# Patient Record
Sex: Male | Born: 1975 | Hispanic: Yes | Marital: Single | Smoking: Former smoker
Health system: Southern US, Community
[De-identification: ages and names within clinical notes are randomized; demographics above are authoritative.]

## PROBLEM LIST (undated history)

## (undated) DIAGNOSIS — K7010 Alcoholic hepatitis without ascites: Secondary | ICD-10-CM

## (undated) DIAGNOSIS — R7881 Bacteremia: Secondary | ICD-10-CM

## (undated) DIAGNOSIS — R188 Other ascites: Secondary | ICD-10-CM

## (undated) DIAGNOSIS — K709 Alcoholic liver disease, unspecified: Secondary | ICD-10-CM

## (undated) DIAGNOSIS — D649 Anemia, unspecified: Secondary | ICD-10-CM

## (undated) HISTORY — PX: HAND SURGERY: SHX662

## (undated) HISTORY — DX: Other ascites: R18.8

## (undated) HISTORY — PX: HEMORRHOID BANDING: SHX5850

---

## 2012-09-07 ENCOUNTER — Encounter (HOSPITAL_COMMUNITY): Payer: Self-pay | Admitting: Emergency Medicine

## 2012-09-07 ENCOUNTER — Emergency Department (HOSPITAL_COMMUNITY): Payer: Self-pay

## 2012-09-07 ENCOUNTER — Emergency Department (HOSPITAL_COMMUNITY)
Admission: EM | Admit: 2012-09-07 | Discharge: 2012-09-07 | Disposition: A | Discharge status: Home / Self Care | Payer: Self-pay | Attending: Emergency Medicine | Admitting: Emergency Medicine

## 2012-09-07 DIAGNOSIS — Y929 Unspecified place or not applicable: Secondary | ICD-10-CM | POA: Insufficient documentation

## 2012-09-07 DIAGNOSIS — Y939 Activity, unspecified: Secondary | ICD-10-CM | POA: Insufficient documentation

## 2012-09-07 DIAGNOSIS — W108XXA Fall (on) (from) other stairs and steps, initial encounter: Secondary | ICD-10-CM | POA: Insufficient documentation

## 2012-09-07 DIAGNOSIS — S93409A Sprain of unspecified ligament of unspecified ankle, initial encounter: Secondary | ICD-10-CM | POA: Insufficient documentation

## 2012-09-07 DIAGNOSIS — S93401A Sprain of unspecified ligament of right ankle, initial encounter: Secondary | ICD-10-CM

## 2012-09-07 MED ORDER — HYDROCODONE-ACETAMINOPHEN 5-325 MG PO TABS: 1.0000 | ORAL_TABLET | Freq: Four times a day (QID) | ORAL | Status: DC | PRN

## 2012-09-07 MED ORDER — NAPROXEN 500 MG PO TABS: 500.0000 mg | ORAL_TABLET | Freq: Two times a day (BID) | ORAL | Status: DC

## 2012-09-07 NOTE — Progress Notes (Signed)
Met Patient at bedside.Patient does not speak english,spanish speaking gentleman.Telephonic interpreting services used.Interpreter explained to patient they would assist [place] today.Patient reports Via the spanish interpreting service that he understands.Case manager role explained.Patient reports he does not have a PCP.Pateint educated on the location-contact number of the health center  On [address].Patient aware of this location and reports he will call the contact number.Patient reports he does not take any medications.Patient educated on the [address] help line. Patient verbalized via the spanish Interpreting service that he understands the verbal- written resources.The patient does not have any questions at this time.No further Case Manager  needs are Identified.

## 2012-09-07 NOTE — Progress Notes (Signed)
Orthopedic Tech Progress Note Patient Details:  Danny Arnold 04/07/1975 [id number]  Ortho Devices Type of Ortho Device: ASO;Crutches Ortho Device/Splint Interventions: Application   Cammer, Mickie Bail 09/07/2012, 11:02 AM

## 2012-09-07 NOTE — ED Notes (Signed)
Unable to sign discharge due computer failure.

## 2012-09-07 NOTE — ED Notes (Signed)
Pt reports fell down 2 stairs last Saturday. Pt c/o pain to right leg and ankle. Pt presents with bruising and swelling to right lower leg and ankle.

## 2012-09-07 NOTE — ED Provider Notes (Signed)
CSN: [id number]     Arrival date & time 09/07/12  0920 History     First MD Initiated Contact with Patient 09/07/12 0945     Chief Complaint  Patient presents with  . Fall  . Leg Pain   (Consider location/radiation/quality/duration/timing/severity/associated sxs/prior Treatment) HPI Comments: Patient presenting with left ankle pain.  He reports that the pain has been present for the past week and has been constant.  One week ago he tripped going down the stairs and twisted his left ankle.  He has noticed some swelling and bruising of the ankle since the injury.  He has been ambulating since the injury, but reports that the pain is worse with ambulation.  He has not been taking anything for pain.  He has not had any medical evaluation prior to coming in today.  He denies numbness or tingling.    The history is provided by the patient. The history is limited by a language barrier. A language interpreter was used Oceanographer used).    History reviewed. No pertinent past medical history. Past Surgical History  Procedure Laterality Date  . Hand surgery     No family history on file. History  Substance Use Topics  . Smoking status: Never Smoker   . Smokeless tobacco: Not on file  . Alcohol Use: Yes    Review of Systems  Musculoskeletal:       Left ankle pain  All other systems reviewed and are negative.    Allergies  Review of patient's allergies indicates no known allergies.  Home Medications  No current outpatient prescriptions on file. BP 155/89  Pulse 110  Temp(Src) 97.9 F (36.6 C) (Oral)  Resp 20  Wt 178 lb (80.74 kg)  SpO2 98% Physical Exam  Nursing note and vitals reviewed. Constitutional: He appears well-developed and well-nourished.  HENT:  Head: Normocephalic and atraumatic.  Neck: Normal range of motion. Neck supple.  Cardiovascular: Normal rate, regular rhythm and normal heart sounds.   Pulses:      Dorsalis pedis pulses are 2+ on the  right side, and 2+ on the left side.  Pulmonary/Chest: Effort normal and breath sounds normal.  Musculoskeletal:       Right ankle: He exhibits decreased range of motion, swelling and ecchymosis. He exhibits no laceration and normal pulse. Tenderness. Medial malleolus tenderness found.  Neurological: He is alert. No sensory deficit.  Skin: Skin is warm and dry. No erythema.  Psychiatric: He has a normal mood and affect.    ED Course   Procedures (including critical care time)  Labs Reviewed - No data to display Dg Tibia/fibula Right  09/07/2012   *RADIOLOGY REPORT*  Clinical Data: Lower leg and ankle pain since falling 1 week ago  RIGHT TIBIA AND FIBULA - 2 VIEW  Comparison: None.  Findings: The distal lower leg is included on the separate examination of the ankle. The mineralization and alignment are normal.  There is no evidence of acute fracture or dislocation.  No soft tissue abnormalities are identified.  IMPRESSION: No acute osseous findings.   Original Report Authenticated By: Carey Bullocks, M.D.   Dg Ankle Complete Right  09/07/2012   *RADIOLOGY REPORT*  Clinical Data: Lower leg and ankle pain since falling 1 week ago.  RIGHT ANKLE - COMPLETE 3+ VIEW  Comparison: None.  Findings: The mineralization and alignment are normal.  There is no evidence of acute fracture or dislocation.  The joint spaces are maintained.  There is some soft tissue  swelling both medially and distally.  IMPRESSION: No acute osseous findings.  Soft tissue swelling around the ankle.   Original Report Authenticated By: Carey Bullocks, M.D.   No diagnosis found.  MDM  Patient presenting with pain of his left ankle after falling down the stairs one week ago.  Xrays today negative.  Patient neurovascularly intact.  Patient given ankle ASO and crutches.  Pascal Lux [place], PA-C 09/08/12 [phone]

## 2012-09-08 NOTE — ED Provider Notes (Signed)
Medical screening examination/treatment/procedure(s) were performed by non-physician practitioner and as supervising physician I was immediately available for consultation/collaboration.   Kedrick Mcnamee, MD 09/08/12 1535 

## 2012-10-27 ENCOUNTER — Emergency Department (HOSPITAL_COMMUNITY): Payer: No Typology Code available for payment source

## 2012-10-27 ENCOUNTER — Encounter (HOSPITAL_COMMUNITY): Payer: Self-pay | Admitting: Emergency Medicine

## 2012-10-27 ENCOUNTER — Inpatient Hospital Stay (HOSPITAL_COMMUNITY)
Admission: EM | Admit: 2012-10-27 | Discharge: 2012-11-08 | DRG: 433 | Disposition: A | Discharge status: Home / Self Care | Payer: Self-pay | Attending: Internal Medicine | Admitting: Internal Medicine

## 2012-10-27 DIAGNOSIS — R7881 Bacteremia: Secondary | ICD-10-CM | POA: Diagnosis present

## 2012-10-27 DIAGNOSIS — R634 Abnormal weight loss: Secondary | ICD-10-CM | POA: Diagnosis present

## 2012-10-27 DIAGNOSIS — K625 Hemorrhage of anus and rectum: Secondary | ICD-10-CM | POA: Diagnosis present

## 2012-10-27 DIAGNOSIS — E871 Hypo-osmolality and hyponatremia: Secondary | ICD-10-CM | POA: Diagnosis present

## 2012-10-27 DIAGNOSIS — R17 Unspecified jaundice: Secondary | ICD-10-CM

## 2012-10-27 DIAGNOSIS — D72829 Elevated white blood cell count, unspecified: Secondary | ICD-10-CM | POA: Diagnosis present

## 2012-10-27 DIAGNOSIS — D649 Anemia, unspecified: Secondary | ICD-10-CM | POA: Diagnosis present

## 2012-10-27 DIAGNOSIS — R791 Abnormal coagulation profile: Secondary | ICD-10-CM | POA: Diagnosis present

## 2012-10-27 DIAGNOSIS — B9689 Other specified bacterial agents as the cause of diseases classified elsewhere: Secondary | ICD-10-CM | POA: Diagnosis present

## 2012-10-27 DIAGNOSIS — E872 Acidosis, unspecified: Secondary | ICD-10-CM | POA: Diagnosis present

## 2012-10-27 DIAGNOSIS — K766 Portal hypertension: Secondary | ICD-10-CM | POA: Diagnosis present

## 2012-10-27 DIAGNOSIS — K5900 Constipation, unspecified: Secondary | ICD-10-CM | POA: Diagnosis present

## 2012-10-27 DIAGNOSIS — F1020 Alcohol dependence, uncomplicated: Secondary | ICD-10-CM | POA: Diagnosis present

## 2012-10-27 DIAGNOSIS — Z6827 Body mass index (BMI) 27.0-27.9, adult: Secondary | ICD-10-CM

## 2012-10-27 DIAGNOSIS — K7010 Alcoholic hepatitis without ascites: Principal | ICD-10-CM | POA: Diagnosis present

## 2012-10-27 DIAGNOSIS — K8309 Other cholangitis: Secondary | ICD-10-CM

## 2012-10-27 DIAGNOSIS — R932 Abnormal findings on diagnostic imaging of liver and biliary tract: Secondary | ICD-10-CM

## 2012-10-27 DIAGNOSIS — K7030 Alcoholic cirrhosis of liver without ascites: Secondary | ICD-10-CM

## 2012-10-27 DIAGNOSIS — E876 Hypokalemia: Secondary | ICD-10-CM | POA: Diagnosis present

## 2012-10-27 DIAGNOSIS — K709 Alcoholic liver disease, unspecified: Secondary | ICD-10-CM

## 2012-10-27 LAB — COMPREHENSIVE METABOLIC PANEL
Alkaline Phosphatase: 140 U/L — ABNORMAL HIGH (ref 39–117)
BUN: 12 mg/dL (ref 6–23)
Calcium: 8.9 mg/dL (ref 8.4–10.5)
GFR calc Af Amer: >90 mL/min (ref >90)
Glucose, Bld: 109 mg/dL — ABNORMAL HIGH (ref 70–99)
Total Protein: 7.9 g/dL (ref 6.0–8.3)

## 2012-10-27 LAB — CBC WITH DIFFERENTIAL/PLATELET
Basophils Relative: 1 % (ref 0–1)
Eosinophils Absolute: 0.2 10*3/uL (ref 0.0–0.7)
HCT: 28.6 % — ABNORMAL LOW (ref 39.0–52.0)
Hemoglobin: 10.5 g/dL — ABNORMAL LOW (ref 13.0–17.0)
Lymphs Abs: 1.2 10*3/uL (ref 0.7–4.0)
MCH: 34.9 pg — ABNORMAL HIGH (ref 26.0–34.0)
MCHC: 36.7 g/dL — ABNORMAL HIGH (ref 30.0–36.0)
Monocytes Absolute: 2 10*3/uL — ABNORMAL HIGH (ref 0.1–1.0)
Neutro Abs: 14.2 10*3/uL — ABNORMAL HIGH (ref 1.7–7.7)

## 2012-10-27 LAB — URINALYSIS, ROUTINE W REFLEX MICROSCOPIC
Nitrite: NEGATIVE
Protein, ur: NEGATIVE mg/dL
Specific Gravity, Urine: 1.015 (ref 1.005–1.030)
Urobilinogen, UA: 0.2 mg/dL (ref 0.0–1.0)

## 2012-10-27 LAB — POCT I-STAT 3, ART BLOOD GAS (G3+)
Bicarbonate: 15.1 mEq/L — ABNORMAL LOW (ref 20.0–24.0)
Patient temperature: 98.9
TCO2: 16 mmol/L (ref 0–100)
pH, Arterial: 7.346 — ABNORMAL LOW (ref 7.350–7.450)
pO2, Arterial: 90 mmHg (ref 80.0–100.0)

## 2012-10-27 LAB — CG4 I-STAT (LACTIC ACID): Lactic Acid, Venous: 1.07 mmol/L (ref 0.5–2.2)

## 2012-10-27 LAB — LIPASE, BLOOD: Lipase: 78 U/L — ABNORMAL HIGH (ref 11–59)

## 2012-10-27 LAB — URINE MICROSCOPIC-ADD ON

## 2012-10-27 MED ORDER — SODIUM CHLORIDE 0.9 % IV BOLUS (SEPSIS)
1000.0000 mL | Freq: Once | INTRAVENOUS | Status: AC
  Administered 2012-10-27: 1000 mL via INTRAVENOUS

## 2012-10-27 MED ORDER — POTASSIUM CHLORIDE CRYS ER 20 MEQ PO TBCR
40.0000 meq | EXTENDED_RELEASE_TABLET | Freq: Once | ORAL | Status: AC
  Administered 2012-10-27: 40 meq via ORAL
  Filled 2012-10-27: qty 2

## 2012-10-27 MED ORDER — IOHEXOL 300 MG/ML  SOLN
25.0000 mL | Freq: Once | INTRAMUSCULAR | Status: AC | PRN
  Administered 2012-10-27: 25 mL via ORAL

## 2012-10-27 NOTE — ED Notes (Addendum)
Pt has VERYJaudiced skin x1 week. Pt reports that he was a VERY heavy drinker of ETOH, but quit 8 months ago.  Pt reports vomiting and pain when he eats x3 days  Pt also reports pain with voiding and subsequently has had minimal fluid intake x4 days.  Pt speaks only spanish but has english speaking friend at [facility].  Last void was today and described as painful & small amount.   Last 2 BM were today: first PTA and also 2 hours PTA. Pt took laxitive 2 hours PTA and earlier in the AM today. Pt describes BM color as "white"  Pt denies any other medical History.

## 2012-10-27 NOTE — ED Provider Notes (Signed)
CSN: [id number]     Arrival date & time 10/27/12  2024 History   First MD Initiated Contact with Patient 10/27/12 2111     Chief Complaint  Patient presents with  . Jaundice   (Consider location/radiation/quality/duration/timing/severity/associated sxs/prior Treatment) HPI Comments: 37 year old male presents with one-week of worsening jaundice. Patient states that he has abdominal pain after eating as well as some nausea. Due to this he has not eaten in the last couple days. He's been constipated the last 20 days when he does have a stool it is hard "like a rock". He says his stools are white. He denies any fevers or chills. Her last one month he lost approximately 15 pounds. Denies any night sweats. Denies any urinary symptoms.  The history is provided by the patient.    History reviewed. No pertinent past medical history. Past Surgical History  Procedure Laterality Date  . Hand surgery     No family history on file. History  Substance Use Topics  . Smoking status: Never Smoker   . Smokeless tobacco: Not on file  . Alcohol Use: Yes     Comment: heavy etoh until 02/2012- since then occasional    Review of Systems  Constitutional: Positive for unexpected weight change. Negative for fever and chills.  Gastrointestinal: Positive for nausea, abdominal pain, constipation and abdominal distention. Negative for vomiting.  Genitourinary: Negative for dysuria.  All other systems reviewed and are negative.    Allergies  Review of patient's allergies indicates no known allergies.  Home Medications   Current Outpatient Rx  Name  Route  Sig  Dispense  Refill  . HYDROcodone-acetaminophen (NORCO/VICODIN) 5-325 MG per tablet   Oral   Take 1-2 tablets by mouth every 6 (six) hours as needed for pain.   20 tablet   0   . naproxen (NAPROSYN) 500 MG tablet   Oral   Take 1 tablet (500 mg total) by mouth 2 (two) times daily.   30 tablet   0    BP 126/69  Pulse 90  Temp(Src) 98.6 F  (37 C) (Oral)  Resp 20  Ht 5\' 2"  (1.575 m)  Wt 163 lb (73.936 kg)  BMI 29.81 kg/m2  SpO2 100% Physical Exam  Nursing note and vitals reviewed. Constitutional: He is oriented to person, place, and time. He appears well-developed and well-nourished. No distress.  HENT:  Head: Normocephalic and atraumatic.  Right Ear: External ear normal.  Left Ear: External ear normal.  Nose: Nose normal.  Eyes: Right eye exhibits no discharge. Left eye exhibits no discharge.  Jaundiced eyes  Neck: Neck supple.  Cardiovascular: Normal rate, regular rhythm, normal heart sounds and intact distal pulses.   Pulmonary/Chest: Effort normal and breath sounds normal.  Abdominal: Soft. He exhibits distension. There is no tenderness.  Musculoskeletal: He exhibits no edema.  Neurological: He is alert and oriented to person, place, and time.  Skin: Skin is warm and dry.  Diffuse jaundice    ED Course  Procedures (including critical care time) Labs Review Labs Reviewed  CBC WITH DIFFERENTIAL - Abnormal; Notable for the following:    WBC 17.8 (*)    RBC 3.01 (*)    Hemoglobin 10.5 (*)    HCT 28.6 (*)    MCH 34.9 (*)    MCHC 36.7 (*)    RDW 24.2 (*)    Neutrophils Relative % 80 (*)    Lymphocytes Relative 7 (*)    Neutro Abs 14.2 (*)    Monocytes Absolute  2.0 (*)    Basophils Absolute 0.2 (*)    All other components within normal limits  COMPREHENSIVE METABOLIC PANEL - Abnormal; Notable for the following:    Sodium 124 (*)    Potassium 2.8 (*)    Chloride 92 (*)    CO2 16 (*)    Glucose, Bld 109 (*)    Albumin 2.5 (*)    AST 145 (*)    Alkaline Phosphatase 140 (*)    Total Bilirubin 40.2 (*)    All other components within normal limits  LIPASE, BLOOD - Abnormal; Notable for the following:    Lipase 78 (*)    All other components within normal limits  URINALYSIS, ROUTINE W REFLEX MICROSCOPIC - Abnormal; Notable for the following:    Color, Urine ORANGE (*)    Bilirubin Urine LARGE (*)     Ketones, ur 15 (*)    Leukocytes, UA TRACE (*)    All other components within normal limits  URINE MICROSCOPIC-ADD ON - Abnormal; Notable for the following:    Squamous Epithelial / LPF FEW (*)    Bacteria, UA FEW (*)    All other components within normal limits  PROTIME-INR - Abnormal; Notable for the following:    Prothrombin Time 17.1 (*)    All other components within normal limits  POCT I-STAT 3, BLOOD GAS (G3+) - Abnormal; Notable for the following:    pH, Arterial 7.346 (*)    pCO2 arterial 27.7 (*)    Bicarbonate 15.1 (*)    Acid-base deficit 9.0 (*)    All other components within normal limits  CG4 I-STAT (LACTIC ACID)   Imaging Review Dg Abd Acute W/chest  10/27/2012   CLINICAL DATA:  Fever and constipation. Abdominal pain.  EXAM: ACUTE ABDOMEN SERIES (ABDOMEN 2 VIEW & CHEST 1 VIEW)  COMPARISON:  None.  FINDINGS: Elevated right diaphragm of uncertain cause. Low volume chest without focal opacity. No cardiomegaly.  Question hepatomegaly given elevated right diaphragm and extension nearly to the pelvic inlet. The stomach appears slightly displaced to the left additionally. Nonobstructive bowel gas pattern. No abnormal intra-abdominal calcification. Negative osseous structures.  IMPRESSION: 1. Nonobstructive bowel gas pattern. No pneumoperitoneum. 2. Elevated right diaphragm, possibly from hepatomegaly. 3. Clear chest.   Electronically Signed   By: Tiburcio Pea   On: 10/27/2012 21:54    MDM   1. Hyperbilirubinemia    Patient is in no acute distress and has normal vital signs. His family brought him in due to him having worsening jaundice. With painless jaundice and weight loss the highest concern is for a mass. I discussed this at the bedside with the patient through his friends the patient is unable to speak [place] well. Patient understands water concern about and denies any current abdominal pain. I've given him fluids will replace potassium. His CT scan is currently pending  and care was transferred to Dr. Redgie Grayer CT scan pending    Audree Camel, MD 10/28/12 [phone]

## 2012-10-27 NOTE — ED Notes (Signed)
Reports jaundice x 1 week.  Denies pain.  Also reports constipation x 22 days and decreased urination over the past several weeks.  Last BM just pta.

## 2012-10-28 ENCOUNTER — Emergency Department (HOSPITAL_COMMUNITY): Payer: No Typology Code available for payment source

## 2012-10-28 ENCOUNTER — Encounter (HOSPITAL_COMMUNITY): Payer: Self-pay | Admitting: Radiology

## 2012-10-28 DIAGNOSIS — K709 Alcoholic liver disease, unspecified: Secondary | ICD-10-CM

## 2012-10-28 DIAGNOSIS — D649 Anemia, unspecified: Secondary | ICD-10-CM | POA: Diagnosis present

## 2012-10-28 DIAGNOSIS — R932 Abnormal findings on diagnostic imaging of liver and biliary tract: Secondary | ICD-10-CM

## 2012-10-28 DIAGNOSIS — K7030 Alcoholic cirrhosis of liver without ascites: Secondary | ICD-10-CM

## 2012-10-28 DIAGNOSIS — K7010 Alcoholic hepatitis without ascites: Principal | ICD-10-CM

## 2012-10-28 DIAGNOSIS — R17 Unspecified jaundice: Secondary | ICD-10-CM

## 2012-10-28 DIAGNOSIS — E871 Hypo-osmolality and hyponatremia: Secondary | ICD-10-CM | POA: Diagnosis present

## 2012-10-28 DIAGNOSIS — D72829 Elevated white blood cell count, unspecified: Secondary | ICD-10-CM | POA: Diagnosis present

## 2012-10-28 LAB — GLUCOSE, CAPILLARY
Glucose-Capillary: 92 mg/dL (ref 70–99)
Glucose-Capillary: 43 mg/dL — CL (ref 70–99)
Glucose-Capillary: 94 mg/dL (ref 70–99)
Glucose-Capillary: 136 mg/dL — ABNORMAL HIGH (ref 70–99)

## 2012-10-28 LAB — PROTIME-INR
INR: 1.43 (ref 0.00–1.49)
Prothrombin Time: 17.1 seconds — ABNORMAL HIGH (ref 11.6–15.2)

## 2012-10-28 LAB — TYPE AND SCREEN

## 2012-10-28 LAB — CBC WITH DIFFERENTIAL/PLATELET
Eosinophils Relative: 1 % (ref 0–5)
MCH: 34.8 pg — ABNORMAL HIGH (ref 26.0–34.0)
Monocytes Absolute: 1.3 10*3/uL — ABNORMAL HIGH (ref 0.1–1.0)
Monocytes Relative: 9 % (ref 3–12)
Neutrophils Relative %: 82 % — ABNORMAL HIGH (ref 43–77)
Platelets: 244 10*3/uL (ref 150–400)
RBC: 2.73 MIL/uL — ABNORMAL LOW (ref 4.22–5.81)
WBC: 14.6 10*3/uL — ABNORMAL HIGH (ref 4.0–10.5)

## 2012-10-28 LAB — BASIC METABOLIC PANEL
Calcium: 7.9 mg/dL — ABNORMAL LOW (ref 8.4–10.5)
Creatinine, Ser: 0.52 mg/dL (ref 0.50–1.35)
GFR calc Af Amer: >90 mL/min (ref >90)
GFR calc non Af Amer: >90 mL/min (ref >90)

## 2012-10-28 LAB — OSMOLALITY, URINE: Osmolality, Ur: 360 mOsm/kg — ABNORMAL LOW (ref 390–1090)

## 2012-10-28 LAB — HEPATIC FUNCTION PANEL
Indirect Bilirubin: 6.3 mg/dL — ABNORMAL HIGH (ref 0.3–0.9)
Total Protein: 6.6 g/dL (ref 6.0–8.3)

## 2012-10-28 MED ORDER — OCTREOTIDE LOAD VIA INFUSION
50.0000 ug | Freq: Once | INTRAVENOUS | Status: AC
  Administered 2012-10-28: 50 ug via INTRAVENOUS
  Filled 2012-10-28: qty 25

## 2012-10-28 MED ORDER — ONDANSETRON HCL 4 MG PO TABS: 4.0000 mg | ORAL_TABLET | Freq: Four times a day (QID) | ORAL | Status: DC | PRN

## 2012-10-28 MED ORDER — DEXTROSE 5 % IV SOLN
1.0000 g | Freq: Three times a day (TID) | INTRAVENOUS | Status: DC
  Administered 2012-10-28 – 2012-11-01 (×13): 1 g via INTRAVENOUS
  Filled 2012-10-28 (×16): qty 1

## 2012-10-28 MED ORDER — PANTOPRAZOLE SODIUM 40 MG IV SOLR
40.0000 mg | Freq: Two times a day (BID) | INTRAVENOUS | Status: DC
  Administered 2012-10-28 – 2012-11-04 (×15): 40 mg via INTRAVENOUS
  Filled 2012-10-28 (×17): qty 40

## 2012-10-28 MED ORDER — VITAMIN B-1 100 MG PO TABS
100.0000 mg | ORAL_TABLET | Freq: Every day | ORAL | Status: DC
  Administered 2012-10-28 – 2012-11-08 (×12): 100 mg via ORAL
  Filled 2012-10-28 (×12): qty 1

## 2012-10-28 MED ORDER — LORAZEPAM 2 MG/ML IJ SOLN: 0.0000 mg | Freq: Four times a day (QID) | INTRAMUSCULAR | Status: AC

## 2012-10-28 MED ORDER — FOLIC ACID 1 MG PO TABS
1.0000 mg | ORAL_TABLET | Freq: Every day | ORAL | Status: DC
Start: 2012-10-28 — End: 2012-11-08
  Administered 2012-10-28 – 2012-11-08 (×12): 1 mg via ORAL
  Filled 2012-10-28 (×12): qty 1

## 2012-10-28 MED ORDER — PNEUMOCOCCAL VAC POLYVALENT 25 MCG/0.5ML IJ INJ
0.5000 mL | INJECTION | INTRAMUSCULAR | Status: AC
  Administered 2012-10-29: 0.5 mL via INTRAMUSCULAR
  Filled 2012-10-28: qty 0.5

## 2012-10-28 MED ORDER — POTASSIUM CHLORIDE 10 MEQ/100ML IV SOLN
10.0000 meq | INTRAVENOUS | Status: AC
  Administered 2012-10-28 (×3): 10 meq via INTRAVENOUS
  Filled 2012-10-28 (×3): qty 100

## 2012-10-28 MED ORDER — THIAMINE HCL 100 MG/ML IJ SOLN
100.0000 mg | Freq: Every day | INTRAMUSCULAR | Status: DC
  Filled 2012-10-28 (×8): qty 1

## 2012-10-28 MED ORDER — POLYETHYLENE GLYCOL 3350 17 G PO PACK
17.0000 g | PACK | Freq: Every day | ORAL | Status: DC | PRN
  Administered 2012-11-02: 17 g via ORAL
  Filled 2012-10-28: qty 1

## 2012-10-28 MED ORDER — SODIUM CHLORIDE 0.9 % IV SOLN
25.0000 ug/h | INTRAVENOUS | Status: DC
  Administered 2012-10-28: 50 ug/h via INTRAVENOUS
  Filled 2012-10-28 (×2): qty 1

## 2012-10-28 MED ORDER — SODIUM CHLORIDE 0.9 % IJ SOLN
3.0000 mL | Freq: Two times a day (BID) | INTRAMUSCULAR | Status: DC
  Administered 2012-10-28 – 2012-11-06 (×7): 3 mL via INTRAVENOUS

## 2012-10-28 MED ORDER — POTASSIUM CHLORIDE CRYS ER 20 MEQ PO TBCR
40.0000 meq | EXTENDED_RELEASE_TABLET | ORAL | Status: AC
  Administered 2012-10-28 (×2): 40 meq via ORAL
  Filled 2012-10-28 (×2): qty 2

## 2012-10-28 MED ORDER — HYDROCORTISONE ACETATE 25 MG RE SUPP
25.0000 mg | Freq: Every morning | RECTAL | Status: DC
  Administered 2012-10-28 – 2012-11-05 (×8): 25 mg via RECTAL
  Filled 2012-10-28 (×9): qty 1

## 2012-10-28 MED ORDER — LORAZEPAM 2 MG/ML IJ SOLN: 1.0000 mg | Freq: Four times a day (QID) | INTRAMUSCULAR | Status: AC | PRN

## 2012-10-28 MED ORDER — ONDANSETRON HCL 4 MG/2ML IJ SOLN: 4.0000 mg | Freq: Four times a day (QID) | INTRAMUSCULAR | Status: DC | PRN

## 2012-10-28 MED ORDER — LORAZEPAM 1 MG PO TABS: 1.0000 mg | ORAL_TABLET | Freq: Four times a day (QID) | ORAL | Status: AC | PRN

## 2012-10-28 MED ORDER — ADULT MULTIVITAMIN W/MINERALS CH
1.0000 | ORAL_TABLET | Freq: Every day | ORAL | Status: DC
  Administered 2012-10-28 – 2012-11-08 (×12): 1 via ORAL
  Filled 2012-10-28 (×12): qty 1

## 2012-10-28 MED ORDER — LORAZEPAM 2 MG/ML IJ SOLN: 0.0000 mg | Freq: Two times a day (BID) | INTRAMUSCULAR | Status: AC

## 2012-10-28 MED ORDER — POTASSIUM CHLORIDE CRYS ER 20 MEQ PO TBCR
20.0000 meq | EXTENDED_RELEASE_TABLET | Freq: Once | ORAL | Status: AC
  Administered 2012-10-28: 20 meq via ORAL
  Filled 2012-10-28: qty 1

## 2012-10-28 MED ORDER — IOHEXOL 300 MG/ML  SOLN
100.0000 mL | Freq: Once | INTRAMUSCULAR | Status: AC | PRN
  Administered 2012-10-28: 100 mL via INTRAVENOUS

## 2012-10-28 MED ORDER — INFLUENZA VAC SPLIT QUAD 0.5 ML IM SUSP
0.5000 mL | INTRAMUSCULAR | Status: AC
  Administered 2012-10-29: 0.5 mL via INTRAMUSCULAR
  Filled 2012-10-28: qty 0.5

## 2012-10-28 NOTE — H&P (Signed)
[facility] History and Physical  Danny Arnold ZOX:[id number] DOB: [birth year] DOA: 10/27/2012  Referring physician: ER physician. PCP: No PCP Per Patient   Chief Complaint: Turning yellow.  Used Engineer, structural. Patient does not speak [place].  HPI: Danny Arnold is a 37 y.o. male with no significant past medical history who drinks alcohol every day except for the last 4 days presented to the ER as patient noticed himself becoming yellowish. Patient also has been having constipation and nausea. He has abdominal pain when he moves his bowels. In the ER patient was found to be jaundiced with blood work showing bilirubin around 40 with CT abdomen and pelvis done with contrast showing signs of portal hypertension and hepatomegaly. Patient states he drinks alcohol everyday except for last 4 days and denies having used any IV drugs. Patient states he does not take any medications. While in the ER patient had a bowel movement which patient states had white stools with frank blood. Denies having blood previously. Denies any abdominal pain fever chills chest pain shortness of breath.  Review of Systems: As presented in the history of presenting illness, rest negative.  History reviewed. No pertinent past medical history. Past Surgical History  Procedure Laterality Date  . Hand surgery     Social History:  reports that he has never smoked. He does not have any smokeless tobacco history on file. He reports that  drinks alcohol. He reports that he does not use illicit drugs. Home. where does patient live-- Can do ADLs. Can patient participate in ADLs?  No Known Allergies  Family History  Problem Relation Age of Onset  . Migraines Mother       Prior to Admission medications   Medication Sig Start Date End Date Taking? Authorizing Provider  Bisacodyl (LAXATIVE PO) Take 5-15 mLs by mouth 2 (two) times daily as needed. For constipation OTC Liquid   Yes Historical Provider, MD    Physical Exam: Filed Vitals:   10/27/12 2300 10/28/12 0215 10/28/12 0300 10/28/12 0347  BP: 138/69 129/72 113/69 126/64  Pulse: 88 105 94 96  Temp:    98.4 F (36.9 C)  TempSrc:    Oral  Resp:  25 21 20   Height:    5\' 7"  (1.702 m)  Weight:    74.662 kg (164 lb 9.6 oz)  SpO2: 100% 100% 100% 99%     General:  Well-developed and nourished.  Eyes: Icterus present. No pallor.  ENT: No discharge from ears eyes nose mouth.  Neck: No mass felt. No neck rigidity. No JVD appreciated.  Cardiovascular: S1-S2 heard.  Respiratory: No rhonchi or crepitations.  Abdomen: Distended abdomen with umbilical hernia.  Skin: Icterus and pale.  Musculoskeletal: Mild edema in the lower extremities.  Psychiatric: Appears normal.  Neurologic: Alert awake oriented to time place and person. Moves all extremities.  Labs on Admission:  Basic Metabolic Panel:  Recent Labs Lab 10/27/12 2208  NA 124*  K 2.8*  CL 92*  CO2 16*  GLUCOSE 109*  BUN 12  CREATININE 0.58  CALCIUM 8.9   Liver Function Tests:  Recent Labs Lab 10/27/12 2208  AST 145*  ALT 47  ALKPHOS 140*  BILITOT 40.2*  PROT 7.9  ALBUMIN 2.5*    Recent Labs Lab 10/27/12 2208  LIPASE 78*   No results found for this basename: AMMONIA,  in the last 168 hours CBC:  Recent Labs Lab 10/27/12 2208  WBC 17.8*  NEUTROABS 14.2*  HGB 10.5*  HCT 28.6*  MCV  95.0  PLT 290   Cardiac Enzymes: No results found for this basename: CKTOTAL, CKMB, CKMBINDEX, TROPONINI,  in the last 168 hours  BNP (last 3 results) No results found for this basename: PROBNP,  in the last 8760 hours CBG: No results found for this basename: GLUCAP,  in the last 168 hours  Radiological Exams on Admission: Ct Abdomen Pelvis W Contrast  10/28/2012   CLINICAL DATA:  Jaundice  EXAM: CT ABDOMEN AND PELVIS WITH CONTRAST  TECHNIQUE: Multidetector CT imaging of the abdomen and pelvis was performed using the standard protocol following bolus  administration of intravenous contrast.  CONTRAST:  OMNIPAQUE IOHEXOL 300 MG/ML  SOLN  COMPARISON:  None.  FINDINGS: BODY WALL: Fat containing umbilical hernia  LOWER CHEST:  Mediastinum: Unremarkable.  Lungs/pleura: Elevated right diaphragm with mild basilar atelectasis  ABDOMEN/PELVIS:  Liver: Enlarged liver (25 cm craniocaudal span midclavicular line), with diffuse patchy hypodensity or hypo-enhancement. No focal abnormality or definitive nodular contour. There is a recannulized/enlarged periumbilical vein.  Biliary: No evidence of biliary obstruction or stone.  Pancreas: Unremarkable.  Spleen: Enlarged at 14 cm craniocaudal span. No focal abnormality.  Adrenals: Unremarkable.  Kidneys and ureters: No hydronephrosis or stone.  Bladder: Unremarkable.  Bowel: No obstruction. Normal appendix.  Retroperitoneum: No mass or adenopathy.  Peritoneum: No free fluid or gas.  Reproductive: Unremarkable.  Vascular: No acute abnormality.  OSSEOUS: No acute abnormalities. No suspicious lytic or blastic lesions.  IMPRESSION: 1. Hepatomegaly and heterogeneous liver parenchyma that could represent fatty infiltration or hepatitis. 2. Although no overt cirrhotic change, there is evidence of portal hypertension with splenomegaly and enlarged paraumbilical vein. 3. No evidence of biliary obstruction to explain jaundice.   Electronically Signed   By: Tiburcio Pea   On: 10/28/2012 00:52   Dg Abd Acute W/chest  10/27/2012   CLINICAL DATA:  Fever and constipation. Abdominal pain.  EXAM: ACUTE ABDOMEN SERIES (ABDOMEN 2 VIEW & CHEST 1 VIEW)  COMPARISON:  None.  FINDINGS: Elevated right diaphragm of uncertain cause. Low volume chest without focal opacity. No cardiomegaly.  Question hepatomegaly given elevated right diaphragm and extension nearly to the pelvic inlet. The stomach appears slightly displaced to the left additionally. Nonobstructive bowel gas pattern. No abnormal intra-abdominal calcification. Negative osseous  structures.  IMPRESSION: 1. Nonobstructive bowel gas pattern. No pneumoperitoneum. 2. Elevated right diaphragm, possibly from hepatomegaly. 3. Clear chest.   Electronically Signed   By: Tiburcio Pea   On: 10/27/2012 21:54     Assessment/Plan Principal Problem:   Jaundice Active Problems:   Hyponatremia   Anemia   Leucocytosis   1. Severe jaundice - at this time I'm repeating LFTs to check if it is direct or indirect bilirubin. Cause is not known. For now I have ordered an acute hepatitis panel and HIV. Possibilities include alcoholic hepatitis. Consult GI for further recommendations. 2. GI bleed - at this time I'm repeating stat CBC type and screen. I will start patient on octreotide and Protonix as patient's CAT scan shows signs of portal hypertension. We'll keep patient n.p.o. for now and on antibiotics. 3. Hyponatremia - probably secondary to liver disease and alcoholism. Check urine sodium. Closely follow metabolic panel. 4. Hypokalemia - probably from poor oral intake. Patient did receive oral potassium in the ER. Repeat metabolic panel has been ordered. 5. Metabolic acidosis - cause not clear. Patient does not look septic at this time. Repeat metabolic panel has been ordered. Patient did receive 2 L normal saline in the ER  for now we will hold off further fluids given hyponatremia. 6. Leukocytosis - patient is afebrile but since patient has possible GI bleed have placed patient on empiric antibiotics for now.    Code Status: Full code.  Family Communication: None.  Disposition Plan: Admit to inpatient.    KAKRAKANDY,ARSHAD N. [facility] Pager [phone].  If 7PM-7AM, please contact night-coverage [email or web address] Password [facility] 10/28/2012, 4:54 AM

## 2012-10-28 NOTE — Progress Notes (Signed)
INITIAL NUTRITION ASSESSMENT  DOCUMENTATION CODES Per approved criteria  -Not Applicable   INTERVENTION: Resource Breeze po TID, each supplement provides 250 kcal and 9 grams of protein.  NUTRITION DIAGNOSIS: Unintentional weight loss related to alcohol abuse as evidenced by 6% weight loss x 2 weeks.   Goal: Pt to meet >/= 90% of their estimated nutrition needs   Monitor:  PO intake, supplement acceptance, labs, weight trend  Reason for Assessment: Pt identified as at nutrition risk on the Malnutrition Screen Tool  37 y.o. male  Admitting Dx: Jaundice  ASSESSMENT: Pt admitted with Jaundice with work up in progress. Pt with hx of ETOH abuse, per hx drinks 6-12 pack beer everyday.  Spoke with pt via interpreter line. Pt reports 6% weight loss x 2 weeks. Pt ate 100% of his Breakfast this am and is willing to try [employer].   Nutrition Focused Physical Exam:  Subcutaneous Fat:  Orbital Region: WNL Upper Arm Region: WNL Thoracic and Lumbar Region: WNL  Muscle:  Temple Region: WNL Clavicle Bone Region: WNL Clavicle and Acromion Bone Region: WNL Scapular Bone Region: WNL Dorsal Hand: WNL Patellar Region: WNL Anterior Thigh Region: WNL Posterior Calf Region: WNL  Edema: not present   Height: Ht Readings from Last 1 Encounters:  10/28/12 5\' 7"  (1.702 m)    Weight: Wt Readings from Last 1 Encounters:  10/28/12 164 lb 9.6 oz (74.662 kg)    Ideal Body Weight: 67.2 kg   % Ideal Body Weight: 111%  Wt Readings from Last 10 Encounters:  10/28/12 164 lb 9.6 oz (74.662 kg)  09/07/12 178 lb (80.74 kg)    Usual Body Weight: 178 lb   % Usual Body Weight: 92%  BMI:  Body mass index is 25.77 kg/(m^2).  Estimated Nutritional Needs: Kcal: 2050-2250 Protein: 100-115 grams Fluid: >2 L/day  Skin: no issues noted  Diet Order: General  EDUCATION NEEDS: -No education needs identified at this time   Intake/Output Summary (Last 24 hours) at 10/29/12  1216 Last data filed at 10/28/12 1700  Gross per 24 hour  Intake    480 ml  Output      0 ml  Net    480 ml    Last BM: 9/15   Labs:   Recent Labs Lab 10/27/12 2208 10/28/12 0735 10/28/12 1718 10/29/12 0515  NA 124* 125*  --  128*  K 2.8* 2.7* 3.4* 3.6  CL 92* 98  --  102  CO2 16* 15*  --  13*  BUN 12 9  --  11  CREATININE 0.58 0.52  --  0.68  CALCIUM 8.9 7.9*  --  8.4  GLUCOSE 109* 95  --  116*    CBG (last 3)   Recent Labs  10/29/12 0554 10/29/12 0807 10/29/12 1135  GLUCAP 104* 105* 111*    Scheduled Meds: . cefoTAXime (CLAFORAN) IV  1 g Intravenous Q8H  . folic acid  1 mg Oral Daily  . hydrocortisone  25 mg Rectal q morning - 10a  . LORazepam  0-4 mg Intravenous Q6H   Followed by  . [START ON 10/30/2012] LORazepam  0-4 mg Intravenous Q12H  . multivitamin with minerals  1 tablet Oral Daily  . pantoprazole (PROTONIX) IV  40 mg Intravenous Q12H  . sodium chloride  3 mL Intravenous Q12H  . thiamine  100 mg Oral Daily   Or  . thiamine  100 mg Intravenous Daily    Continuous Infusions:    History reviewed. No  pertinent past medical history.  Past Surgical History  Procedure Laterality Date  . Hand surgery      Kendell Bane RD, LDN, CNSC [phone] Pager [phone] After Hours Pager

## 2012-10-28 NOTE — Consult Note (Signed)
[facility] Consultation  Referring Provider:  [facility]   Primary Care Physician:  No PCP Per Patient Primary Gastroenterologist:    none     Reason for Consultation:  Jaundice            HPI:   Danny Arnold is a 37 y.o. non-English speaking male admitted with weakness, jaundice and rectal bleeding. History obtained through telephone interpreter. Patient began feeling poor about 1.5 months ago. He has had frequent morning time nausea without vomiting. Patient noticed yellowing of skin several days ago. One week ago he began seeing blood in his stool. Patient describes constipation with straining. He has anal pain but no abdominal pain.  Over last few weeks he He has lost about 13 pounds recently. ETOH use is difficult for interpreter to clarify as patient speaks of "degrees" of ETOH.  It does sound as though patient has several ETOH a day and has done so for several years. No rx or OTC meds. No vitamins. No illicit drug use. No liver disease or colon cancer in family Past Surgical History  Procedure Laterality Date  . Hand surgery      Family History  Problem Relation Age of Onset  . Migraines Mother    No liver disease, no colon cancer  History  Substance Use Topics  . Smoking status: Never Smoker   . Smokeless tobacco: Not on file  . Alcohol Use: Yes     Comment: heavy etoh until 02/2012- since then occasional    Prior to Admission medications   Medication Sig Start Date End Date Taking? Authorizing Provider  Bisacodyl (LAXATIVE PO) Take 5-15 mLs by mouth 2 (two) times daily as needed. For constipation OTC Liquid   Yes Historical Provider, MD    Current Facility-Administered Medications  Medication Dose Route Frequency Provider Last Rate Last Dose  . cefoTAXime (CLAFORAN) 1 g in dextrose 5 % 50 mL IVPB  1 g Intravenous Q8H Eduard Clos, MD   1 g at 10/28/12 0554  . folic acid (FOLVITE) tablet 1 mg  1 mg Oral Daily Eduard Clos, MD      .  LORazepam (ATIVAN) injection 0-4 mg  0-4 mg Intravenous Q6H Eduard Clos, MD       Followed by  . [START ON 10/30/2012] LORazepam (ATIVAN) injection 0-4 mg  0-4 mg Intravenous Q12H Eduard Clos, MD      . LORazepam (ATIVAN) tablet 1 mg  1 mg Oral Q6H PRN Eduard Clos, MD       Or  . LORazepam (ATIVAN) injection 1 mg  1 mg Intravenous Q6H PRN Eduard Clos, MD      . multivitamin with minerals tablet 1 tablet  1 tablet Oral Daily Eduard Clos, MD      . octreotide (SANDOSTATIN) 2 mcg/mL load via infusion 50 mcg  50 mcg Intravenous Once Eduard Clos, MD       And  . octreotide (SANDOSTATIN) 2 mcg/mL in sodium chloride 0.9 % 250 mL infusion  25-50 mcg/hr Intravenous Continuous Eduard Clos, MD      . ondansetron [facility]) tablet 4 mg  4 mg Oral Q6H PRN Eduard Clos, MD       Or  . ondansetron (ZOFRAN) injection 4 mg  4 mg Intravenous Q6H PRN Eduard Clos, MD      . pantoprazole (PROTONIX) injection 40 mg  40 mg Intravenous Q12H Eduard Clos, MD      .  sodium chloride 0.9 % injection 3 mL  3 mL Intravenous Q12H Eduard Clos, MD      . thiamine (VITAMIN B-1) tablet 100 mg  100 mg Oral Daily Eduard Clos, MD       Or  . thiamine (B-1) injection 100 mg  100 mg Intravenous Daily Eduard Clos, MD        Allergies as of 10/27/2012  . (No Known Allergies)    Review of Systems:    All systems reviewed and negative except where noted in HPI.    Physical Exam:  Vital signs in last 24 hours: Temp:  [98.3 F (36.8 C)-98.6 F (37 C)] 98.3 F (36.8 C) (09/15 0545) Pulse Rate:  [88-105] 93 (09/15 0545) Resp:  [18-25] 18 (09/15 0545) BP: (107-138)/(51-72) 107/51 mmHg (09/15 0545) SpO2:  [99 %-100 %] 100 % (09/15 0545) Weight:  [163 lb (73.936 kg)-164 lb 9.6 oz (74.662 kg)] 164 lb 9.6 oz (74.662 kg) (09/15 0347) Last BM Date: 10/28/12 General:   Spanish male in NAD Head:  Normocephalic and atraumatic. Eyes:    Icteric sclera Ears:  Normal auditory acuity. Neck:  Supple; no masses felt Lungs:  Respirations even and unlabored. Lungs clear to auscultation bilaterally.   No wheezes, crackles, or rhonchi.  Heart:  Regular rate and rhythm Abdomen:  Soft, mildly distended, nontender, +hepatomegaly. Reducible umbilical hernia. No appreciate masses.   Rectal:  No external hemorrhoids. DRE: no definite masses. Slight anal canal wall abnormalities, possibly hemorrhoidal tissue. Stool yellowish brown without overt blood.  Msk:  Symmetrical without gross deformities.  Extremities:  Without edema. Neurologic:  Alert and  oriented x4;  grossly normal neurologically. Skin:  Intact without significant lesions or rashes. Cervical Nodes:  No significant cervical adenopathy. Psych:  Alert and cooperative. Normal affect.  LAB RESULTS:  Recent Labs  10/27/12 2208 10/28/12 0735  WBC 17.8* 14.6*  HGB 10.5* 9.5*  HCT 28.6* 25.9*  PLT 290 244   BMET  Recent Labs  10/27/12 2208 10/28/12 0735  NA 124* 125*  K 2.8* 2.7*  CL 92* 98  CO2 16* 15*  GLUCOSE 109* 95  BUN 12 9  CREATININE 0.58 0.52  CALCIUM 8.9 7.9*   LFT  Recent Labs  10/28/12 0735  PROT 6.6  ALBUMIN 2.0*  AST 129*  ALT 43  ALKPHOS PENDING  BILITOT PENDING  BILIDIR PENDING  IBILI PENDING   PT/INR  Recent Labs  10/27/12 2345 10/28/12 0735  LABPROT 17.1* 17.1*  INR 1.43 1.43    STUDIES: Ct Abdomen Pelvis W Contrast  10/28/2012   CLINICAL DATA:  Jaundice  EXAM: CT ABDOMEN AND PELVIS WITH CONTRAST  TECHNIQUE: Multidetector CT imaging of the abdomen and pelvis was performed using the standard protocol following bolus administration of intravenous contrast.  CONTRAST:  OMNIPAQUE IOHEXOL 300 MG/ML  SOLN  COMPARISON:  None.  FINDINGS: BODY WALL: Fat containing umbilical hernia  LOWER CHEST:  Mediastinum: Unremarkable.  Lungs/pleura: Elevated right diaphragm with mild basilar atelectasis  ABDOMEN/PELVIS:  Liver: Enlarged liver  (25 cm craniocaudal span midclavicular line), with diffuse patchy hypodensity or hypo-enhancement. No focal abnormality or definitive nodular contour. There is a recannulized/enlarged periumbilical vein.  Biliary: No evidence of biliary obstruction or stone.  Pancreas: Unremarkable.  Spleen: Enlarged at 14 cm craniocaudal span. No focal abnormality.  Adrenals: Unremarkable.  Kidneys and ureters: No hydronephrosis or stone.  Bladder: Unremarkable.  Bowel: No obstruction. Normal appendix.  Retroperitoneum: No mass or adenopathy.  Peritoneum: No  free fluid or gas.  Reproductive: Unremarkable.  Vascular: No acute abnormality.  OSSEOUS: No acute abnormalities. No suspicious lytic or blastic lesions.  IMPRESSION: 1. Hepatomegaly and heterogeneous liver parenchyma that could represent fatty infiltration or hepatitis. 2. Although no overt cirrhotic change, there is evidence of portal hypertension with splenomegaly and enlarged paraumbilical vein. 3. No evidence of biliary obstruction to explain jaundice.   Electronically Signed   By: Tiburcio Pea   On: 10/28/2012 00:52   Dg Abd Acute W/chest  10/27/2012   CLINICAL DATA:  Fever and constipation. Abdominal pain.  EXAM: ACUTE ABDOMEN SERIES (ABDOMEN 2 VIEW & CHEST 1 VIEW)  COMPARISON:  None.  FINDINGS: Elevated right diaphragm of uncertain cause. Low volume chest without focal opacity. No cardiomegaly.  Question hepatomegaly given elevated right diaphragm and extension nearly to the pelvic inlet. The stomach appears slightly displaced to the left additionally. Nonobstructive bowel gas pattern. No abnormal intra-abdominal calcification. Negative osseous structures.  IMPRESSION: 1. Nonobstructive bowel gas pattern. No pneumoperitoneum. 2. Elevated right diaphragm, possibly from hepatomegaly. 3. Clear chest.   Electronically Signed   By: Tiburcio Pea   On: 10/27/2012 21:54   PREVIOUS ENDOSCOPIES:            none   Impression / Plan:   1. Acute alcoholic liver  disease with portal hypertension. At this point, no evidence for cirrhosis.  Transaminase elevated in pattern typical for ETOH. Acute viral hepatitis pending but would except significant transaminitis with acute viral hepatitis. Portal hypertension manifested by splenomegaly and recanalized umbilical vein.  Prothrombin time elevated at 17. LFTs improved overnight. Continue supportive care with ETOH withdrawal protocol. Check am LFTs. No evidence of esophageal varices / renal function normal so not sure Octreotide warranted.  2. Hyponatremia, ? Beer potomania.   3. Hypokalemia. Treated by hospitalist.   4. Rectal bleeding. Associated with constipation / straining. Bleeding probably perianal in nature (fissure or internal hemorrhoids). Rectal varices not excluded. Polyp / neoplasm not excluded either. Will treat with anusol suppositoires. Will address constipation as well. No FMH of colon cancer. Further evaluation can be done outpatient.   Thanks   LOS: 1 day   Willette Cluster  10/28/2012, 9:13 AM  GI ATTENDING  History, laboratories, x-rays reviewed. Patient seen and examined. Agree with H&P as outlined above. The patient is a chronic alcoholic. He presents with decompensated liver disease, likely acute alcoholic hepatitis on top of chronic liver disease. He has evidence of portal hypertension as well as hepatic synthetic dysfunction with an elevated INR. Also loculated abnormalities. Agree with hydration, correction of electrolyte abnormalities, assessment for other causes that may contribute to liver disease, and most importantly discontinuation of all alcohol. We're available as needed for questions. Will sign off.  Wilhemina Bonito. Eda Keys., M.D. [facility] Division of Gastroenterology

## 2012-10-28 NOTE — Progress Notes (Signed)
Patient ID: Danny Arnold  male  ZOX:[id number]    DOB: [birth year]    DOA: 10/27/2012  PCP: No PCP Per Patient  Assessment/Plan: Principal Problem:   Jaundice: Acute viral hepatitis versus alcoholic liver disease - Workup in progress, LFTs were repeated, and interestingly bilirubin improved to 30.6 and transaminases improving. Hepatitis panel, HIV pending - CT abdomen shows no evidence for cirrhosis although he has the portal hypertension. - GI consulted  Active Problems: Hypokalemia: Replaced, will recheck potassium    Hyponatremia: Improving - Continue to monitor, patient alert and oriented  History of alcohol abuse: - Talk to the patient through the interpreter line, he reports drinking 6-12 pack of beer every day, hard liquor for the last 2 months. - Continue CIWA protocol -Counseled strongly for alcohol cessation     Anemia: - Continue to monitor H&H, patient had 2 episodes of rectal bleeding, fresh blood - Continue Anusol suppository, avoid constipation    Leucocytosis - Continue Cefotaxime  DVT Prophylaxis:  Code Status:  Disposition:    Subjective: The patient denies any specific complaints at this time, talk to the patient through interpreter line   Objective: Weight change:   Intake/Output Summary (Last 24 hours) at 10/28/12 1433 Last data filed at 10/28/12 1051  Gross per 24 hour  Intake      3 ml  Output    250 ml  Net   -247 ml   Blood pressure 107/51, pulse 90, temperature 98.3 F (36.8 C), temperature source Oral, resp. rate 18, height 5\' 7"  (1.702 m), weight 74.662 kg (164 lb 9.6 oz), SpO2 100.00%.  Physical Exam: General: Alert and awake, oriented x3, not in any acute distress. HEENT: icteric sclera, PERLA, EOMI CVS: S1-S2 clear, no murmur rubs or gallops Chest: clear to auscultation bilaterally, no wheezing, rales or rhonchi Abdomen: soft nontender,distended, normal bowel sounds  Extremities: no cyanosis, clubbing or edema noted  bilaterally Neuro: Cranial nerves II-XII intact, no focal neurological deficits  Lab Results: Basic Metabolic Panel:  Recent Labs Lab 10/27/12 2208 10/28/12 0735  NA 124* 125*  K 2.8* 2.7*  CL 92* 98  CO2 16* 15*  GLUCOSE 109* 95  BUN 12 9  CREATININE 0.58 0.52  CALCIUM 8.9 7.9*   Liver Function Tests:  Recent Labs Lab 10/27/12 2208 10/28/12 0735  AST 145* 129*  ALT 47 43  ALKPHOS 140* 123*  BILITOT 40.2* 30.6*  PROT 7.9 6.6  ALBUMIN 2.5* 2.0*    Recent Labs Lab 10/27/12 2208  LIPASE 78*   No results found for this basename: AMMONIA,  in the last 168 hours CBC:  Recent Labs Lab 10/27/12 2208 10/28/12 0735  WBC 17.8* 14.6*  NEUTROABS 14.2* 12.1*  HGB 10.5* 9.5*  HCT 28.6* 25.9*  MCV 95.0 94.9  PLT 290 244   Cardiac Enzymes: No results found for this basename: CKTOTAL, CKMB, CKMBINDEX, TROPONINI,  in the last 168 hours BNP: No components found with this basename: POCBNP,  CBG:  Recent Labs Lab 10/28/12 0542 10/28/12 0813 10/28/12 1139  GLUCAP 90 92 92     Micro Results: Recent Results (from the past 240 hour(s))  CLOSTRIDIUM DIFFICILE BY PCR     Status: None   Collection Time    10/28/12  5:01 AM      Result Value Range Status   C difficile by pcr NEGATIVE  NEGATIVE Final  STOOL CULTURE     Status: None   Collection Time    10/28/12  5:01 AM  Result Value Range Status   Specimen Description STOOL   Final   Special Requests NONE   Final   Culture     Final   Value: Culture reincubated for better growth     Performed at [facility]   Report Status PENDING   Incomplete    Studies/Results: Ct Abdomen Pelvis W Contrast  10/28/2012   CLINICAL DATA:  Jaundice  EXAM: CT ABDOMEN AND PELVIS WITH CONTRAST  TECHNIQUE: Multidetector CT imaging of the abdomen and pelvis was performed using the standard protocol following bolus administration of intravenous contrast.  CONTRAST:  OMNIPAQUE IOHEXOL 300 MG/ML  SOLN  COMPARISON:   None.  FINDINGS: BODY WALL: Fat containing umbilical hernia  LOWER CHEST:  Mediastinum: Unremarkable.  Lungs/pleura: Elevated right diaphragm with mild basilar atelectasis  ABDOMEN/PELVIS:  Liver: Enlarged liver (25 cm craniocaudal span midclavicular line), with diffuse patchy hypodensity or hypo-enhancement. No focal abnormality or definitive nodular contour. There is a recannulized/enlarged periumbilical vein.  Biliary: No evidence of biliary obstruction or stone.  Pancreas: Unremarkable.  Spleen: Enlarged at 14 cm craniocaudal span. No focal abnormality.  Adrenals: Unremarkable.  Kidneys and ureters: No hydronephrosis or stone.  Bladder: Unremarkable.  Bowel: No obstruction. Normal appendix.  Retroperitoneum: No mass or adenopathy.  Peritoneum: No free fluid or gas.  Reproductive: Unremarkable.  Vascular: No acute abnormality.  OSSEOUS: No acute abnormalities. No suspicious lytic or blastic lesions.  IMPRESSION: 1. Hepatomegaly and heterogeneous liver parenchyma that could represent fatty infiltration or hepatitis. 2. Although no overt cirrhotic change, there is evidence of portal hypertension with splenomegaly and enlarged paraumbilical vein. 3. No evidence of biliary obstruction to explain jaundice.   Electronically Signed   By: Tiburcio Pea   On: 10/28/2012 00:52   Dg Abd Acute W/chest  10/27/2012   CLINICAL DATA:  Fever and constipation. Abdominal pain.  EXAM: ACUTE ABDOMEN SERIES (ABDOMEN 2 VIEW & CHEST 1 VIEW)  COMPARISON:  None.  FINDINGS: Elevated right diaphragm of uncertain cause. Low volume chest without focal opacity. No cardiomegaly.  Question hepatomegaly given elevated right diaphragm and extension nearly to the pelvic inlet. The stomach appears slightly displaced to the left additionally. Nonobstructive bowel gas pattern. No abnormal intra-abdominal calcification. Negative osseous structures.  IMPRESSION: 1. Nonobstructive bowel gas pattern. No pneumoperitoneum. 2. Elevated right diaphragm,  possibly from hepatomegaly. 3. Clear chest.   Electronically Signed   By: Tiburcio Pea   On: 10/27/2012 21:54    Medications: Scheduled Meds: . cefoTAXime (CLAFORAN) IV  1 g Intravenous Q8H  . folic acid  1 mg Oral Daily  . hydrocortisone  25 mg Rectal q morning - 10a  . LORazepam  0-4 mg Intravenous Q6H   Followed by  . [START ON 10/30/2012] LORazepam  0-4 mg Intravenous Q12H  . multivitamin with minerals  1 tablet Oral Daily  . octreotide  50 mcg Intravenous Once  . pantoprazole (PROTONIX) IV  40 mg Intravenous Q12H  . potassium chloride  10 mEq Intravenous Q1 Hr x 3  . potassium chloride  40 mEq Oral Q4H  . sodium chloride  3 mL Intravenous Q12H  . thiamine  100 mg Oral Daily   Or  . thiamine  100 mg Intravenous Daily      LOS: 1 day   Danny Arnold M.D. [facility] 10/28/2012, 2:33 PM Pager: [phone]  If 7PM-7AM, please contact night-coverage [email or web address] Password [email or web address]

## 2012-10-28 NOTE — Progress Notes (Signed)
UR COMPLETED  

## 2012-10-28 NOTE — ED Provider Notes (Signed)
Received pt in turn over from Dr. Gwenlyn Fudge at approximately 12:00 AM. Patient was pending a CT of his abdomen and pelvis and disposition. Patient has remarkable jaundice and icterus and significant elevations of his liver enzymes and bilirubin. CT abdomen and pelvis reveals significant hepatomegaly and evidence of portal hypertension. Plan to consult inpatient team for admission for further evaluation and treatment.  2:03 AM Patient is in no acute distress at this time his vital signs are stable.  Results for orders placed during the hospital encounter of 10/27/12  CBC WITH DIFFERENTIAL      Result Value Range   WBC 17.8 (*) 4.0 - 10.5 K/uL   RBC 3.01 (*) 4.22 - 5.81 MIL/uL   Hemoglobin 10.5 (*) 13.0 - 17.0 g/dL   HCT 16.1 (*) 09.6 - 04.5 %   MCV 95.0  78.0 - 100.0 fL   MCH 34.9 (*) 26.0 - 34.0 pg   MCHC 36.7 (*) 30.0 - 36.0 g/dL   RDW 40.9 (*) 81.1 - 91.4 %   Platelets 290  150 - 400 K/uL   Neutrophils Relative % 80 (*) 43 - 77 %   Lymphocytes Relative 7 (*) 12 - 46 %   Monocytes Relative 11  3 - 12 %   Eosinophils Relative 1  0 - 5 %   Basophils Relative 1  0 - 1 %   Neutro Abs 14.2 (*) 1.7 - 7.7 K/uL   Lymphs Abs 1.2  0.7 - 4.0 K/uL   Monocytes Absolute 2.0 (*) 0.1 - 1.0 K/uL   Eosinophils Absolute 0.2  0.0 - 0.7 K/uL   Basophils Absolute 0.2 (*) 0.0 - 0.1 K/uL   WBC Morphology MILD LEFT SHIFT (1-5% METAS, OCC MYELO, OCC BANDS)    COMPREHENSIVE METABOLIC PANEL      Result Value Range   Sodium 124 (*) 135 - 145 mEq/L   Potassium 2.8 (*) 3.5 - 5.1 mEq/L   Chloride 92 (*) 96 - 112 mEq/L   CO2 16 (*) 19 - 32 mEq/L   Glucose, Bld 109 (*) 70 - 99 mg/dL   BUN 12  6 - 23 mg/dL   Creatinine, Ser 7.82  0.50 - 1.35 mg/dL   Calcium 8.9  8.4 - 95.6 mg/dL   Total Protein 7.9  6.0 - 8.3 g/dL   Albumin 2.5 (*) 3.5 - 5.2 g/dL   AST 213 (*) 0 - 37 U/L   ALT 47  0 - 53 U/L   Alkaline Phosphatase 140 (*) 39 - 117 U/L   Total Bilirubin 40.2 (*) 0.3 - 1.2 mg/dL   GFR calc non Af Amer >90   >90 mL/min   GFR calc Af Amer >90  >90 mL/min  LIPASE, BLOOD      Result Value Range   Lipase 78 (*) 11 - 59 U/L  URINALYSIS, ROUTINE W REFLEX MICROSCOPIC      Result Value Range   Color, Urine ORANGE (*) YELLOW   APPearance CLEAR  CLEAR   Specific Gravity, Urine 1.015  1.005 - 1.030   pH 7.0  5.0 - 8.0   Glucose, UA NEGATIVE  NEGATIVE mg/dL   Hgb urine dipstick NEGATIVE  NEGATIVE   Bilirubin Urine LARGE (*) NEGATIVE   Ketones, ur 15 (*) NEGATIVE mg/dL   Protein, ur NEGATIVE  NEGATIVE mg/dL   Urobilinogen, UA 0.2  0.0 - 1.0 mg/dL   Nitrite NEGATIVE  NEGATIVE   Leukocytes, UA TRACE (*) NEGATIVE  URINE MICROSCOPIC-ADD ON  Result Value Range   Squamous Epithelial / LPF FEW (*) RARE   WBC, UA 0-2  <3 WBC/hpf   Bacteria, UA FEW (*) RARE  PROTIME-INR      Result Value Range   Prothrombin Time 17.1 (*) 11.6 - 15.2 seconds   INR 1.43  0.00 - 1.49  POCT I-STAT 3, BLOOD GAS (G3+)      Result Value Range   pH, Arterial 7.346 (*) 7.350 - 7.450   pCO2 arterial 27.7 (*) 35.0 - 45.0 mmHg   pO2, Arterial 90.0  80.0 - 100.0 mmHg   Bicarbonate 15.1 (*) 20.0 - 24.0 mEq/L   TCO2 16  0 - 100 mmol/L   O2 Saturation 97.0     Acid-base deficit 9.0 (*) 0.0 - 2.0 mmol/L   Patient temperature 98.9 F     Collection site RADIAL, ALLEN'S TEST ACCEPTABLE     Drawn by Operator     Sample type ARTERIAL    CG4 I-STAT (LACTIC ACID)      Result Value Range   Lactic Acid, Venous 1.07  0.5 - 2.2 mmol/L      Darlys Gales, MD 10/28/12 [id number]

## 2012-10-28 NOTE — Progress Notes (Signed)
Called [facility] [facility] at 1000 for Gastro MD to speak with patient.

## 2012-10-29 LAB — COMPREHENSIVE METABOLIC PANEL
ALT: 50 U/L (ref 0–53)
BUN: 11 mg/dL (ref 6–23)
CO2: 13 mEq/L — ABNORMAL LOW (ref 19–32)
Calcium: 8.4 mg/dL (ref 8.4–10.5)
Creatinine, Ser: 0.68 mg/dL (ref 0.50–1.35)
GFR calc Af Amer: >90 mL/min (ref >90)
GFR calc non Af Amer: >90 mL/min (ref >90)
Glucose, Bld: 116 mg/dL — ABNORMAL HIGH (ref 70–99)
Total Protein: 6.8 g/dL (ref 6.0–8.3)

## 2012-10-29 LAB — HEPATITIS PANEL, ACUTE
Hep B C IgM: NEGATIVE
Hepatitis B Surface Ag: NEGATIVE

## 2012-10-29 LAB — IRON AND TIBC
Iron: 69 ug/dL (ref 42–135)
Saturation Ratios: 48 % (ref 20–55)
UIBC: 74 ug/dL — ABNORMAL LOW (ref 125–400)

## 2012-10-29 LAB — RETICULOCYTES
RBC.: 2.79 MIL/uL — ABNORMAL LOW (ref 4.22–5.81)
Retic Count, Absolute: 139.5 10*3/uL (ref 19.0–186.0)

## 2012-10-29 LAB — GLUCOSE, CAPILLARY
Glucose-Capillary: 104 mg/dL — ABNORMAL HIGH (ref 70–99)
Glucose-Capillary: 105 mg/dL — ABNORMAL HIGH (ref 70–99)
Glucose-Capillary: 106 mg/dL — ABNORMAL HIGH (ref 70–99)

## 2012-10-29 LAB — GAMMA GT: GGT: 78 U/L — ABNORMAL HIGH (ref 7–51)

## 2012-10-29 MED ORDER — BOOST / RESOURCE BREEZE PO LIQD
1.0000 | Freq: Three times a day (TID) | ORAL | Status: DC
  Administered 2012-10-29 – 2012-11-08 (×25): 1 via ORAL

## 2012-10-29 NOTE — Progress Notes (Signed)
Critical lab value: total bilirubin 33.9 this am.  Sent message to floor coverage around 0640 am via Amion. No new orders at this time.

## 2012-10-29 NOTE — Progress Notes (Signed)
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 HIV-negative - CT abdomen shows no evidence for cirrhosis although he has the portal hypertension, does not have any biliary obstruction on CT abdomen. - GI was consulted, recommended continuing current workup and complete cessation of alcohol. Patient was explained through the interpreter line to stop alcohol drinking. - Alkaline phosphatase is somewhat elevated with direct hyperbilirubinemia, no biliary obstruction. Ordered GGT, 5'NT.  Rule out other causes, ordered anemia panel to assess ferritin level (rule out hemochromatosis). Rule out any hemolysis, ordered LDH and haptoglobin.    Active Problems:   Hyponatremia: Improving, likely secondary to dehydration and alcoholic liver disease - Continue to monitor, patient alert and oriented  History of alcohol abuse: - he reports drinking 6-12 pack of beer every day, hard liquor for the last 2 months. - Continue CIWA protocol - Counseled strongly for alcohol cessation     Anemia: 2 episodes of rectal bleeding, patient also reported constipation and straining -  Continue Anusol suppository, avoid constipation    Leucocytosis - Continue Cefotaxime, follow CBC  DVT Prophylaxis:  Code Status:  Disposition:    Subjective: The patient denies any specific complaints at this time, talk to the patient through interpreter line   Objective: Weight change:   Intake/Output Summary (Last 24 hours) at 10/29/12 1354 Last data filed at 10/28/12 1700  Gross per 24 hour  Intake    240 ml  Output      0 ml  Net    240 ml   Blood pressure 133/70, pulse 70, temperature 98.7 F (37.1 C), temperature source Oral, resp. rate 16, height 5\' 7"  (1.702 m), weight 74.662 kg (164 lb 9.6  oz), SpO2 98.00%.  Physical Exam: General: A x O x3, NAD jaundiced with icteric sclera CVS: S1-S2 clear, no murmur rubs or gallops Chest: clear to auscultation bilaterally Abdomen: soft nontender,distended, normal bowel sounds  Extremities: no cyanosis, clubbing or edema noted bilaterally  Lab Results: Basic Metabolic Panel:  Recent Labs Lab 10/28/12 0735 10/28/12 1718 10/29/12 0515  NA 125*  --  128*  K 2.7* 3.4* 3.6  CL 98  --  102  CO2 15*  --  13*  GLUCOSE 95  --  116*  BUN 9  --  11  CREATININE 0.52  --  0.68  CALCIUM 7.9*  --  8.4   Liver Function Tests:  Recent Labs Lab 10/28/12 0735 10/29/12 0515  AST 129* 129*  ALT 43 50  ALKPHOS 123* 136*  BILITOT 30.6* 33.9*  PROT 6.6 6.8  ALBUMIN 2.0* 2.0*    Recent Labs Lab 10/27/12 2208  LIPASE 78*   No results found for this basename: AMMONIA,  in the last 168 hours CBC:  Recent Labs Lab 10/27/12 2208 10/28/12 0735  WBC 17.8* 14.6*  NEUTROABS 14.2* 12.1*  HGB 10.5* 9.5*  HCT 28.6* 25.9*  MCV 95.0 94.9  PLT 290 244   Cardiac Enzymes: No results found for this basename: CKTOTAL, CKMB, CKMBINDEX, TROPONINI,  in the last 168 hours BNP: No components found with this basename: POCBNP,  CBG:  Recent Labs Lab 10/28/12 2205 10/29/12 0142 10/29/12 0554 10/29/12 0807 10/29/12 1135  GLUCAP 136* 131* 104* 105* 111*     Micro Results: Recent Results (from the past 240  hour(s))  CLOSTRIDIUM DIFFICILE BY PCR     Status: None   Collection Time    10/28/12  5:01 AM      Result Value Range Status   C difficile by pcr NEGATIVE  NEGATIVE Final  STOOL CULTURE     Status: None   Collection Time    10/28/12  5:01 AM      Result Value Range Status   Specimen Description STOOL   Final   Special Requests NONE   Final   Culture     Final   Value: NO SUSPICIOUS COLONIES, CONTINUING TO HOLD     Performed at [facility]   Report Status PENDING   Incomplete    Studies/Results: Ct Abdomen Pelvis W  Contrast  10/28/2012   CLINICAL DATA:  Jaundice  EXAM: CT ABDOMEN AND PELVIS WITH CONTRAST  TECHNIQUE: Multidetector CT imaging of the abdomen and pelvis was performed using the standard protocol following bolus administration of intravenous contrast.  CONTRAST:  OMNIPAQUE IOHEXOL 300 MG/ML  SOLN  COMPARISON:  None.  FINDINGS: BODY WALL: Fat containing umbilical hernia  LOWER CHEST:  Mediastinum: Unremarkable.  Lungs/pleura: Elevated right diaphragm with mild basilar atelectasis  ABDOMEN/PELVIS:  Liver: Enlarged liver (25 cm craniocaudal span midclavicular line), with diffuse patchy hypodensity or hypo-enhancement. No focal abnormality or definitive nodular contour. There is a recannulized/enlarged periumbilical vein.  Biliary: No evidence of biliary obstruction or stone.  Pancreas: Unremarkable.  Spleen: Enlarged at 14 cm craniocaudal span. No focal abnormality.  Adrenals: Unremarkable.  Kidneys and ureters: No hydronephrosis or stone.  Bladder: Unremarkable.  Bowel: No obstruction. Normal appendix.  Retroperitoneum: No mass or adenopathy.  Peritoneum: No free fluid or gas.  Reproductive: Unremarkable.  Vascular: No acute abnormality.  OSSEOUS: No acute abnormalities. No suspicious lytic or blastic lesions.  IMPRESSION: 1. Hepatomegaly and heterogeneous liver parenchyma that could represent fatty infiltration or hepatitis. 2. Although no overt cirrhotic change, there is evidence of portal hypertension with splenomegaly and enlarged paraumbilical vein. 3. No evidence of biliary obstruction to explain jaundice.   Electronically Signed   By: Tiburcio Pea   On: 10/28/2012 00:52   Dg Abd Acute W/chest  10/27/2012   CLINICAL DATA:  Fever and constipation. Abdominal pain.  EXAM: ACUTE ABDOMEN SERIES (ABDOMEN 2 VIEW & CHEST 1 VIEW)  COMPARISON:  None.  FINDINGS: Elevated right diaphragm of uncertain cause. Low volume chest without focal opacity. No cardiomegaly.  Question hepatomegaly given elevated right  diaphragm and extension nearly to the pelvic inlet. The stomach appears slightly displaced to the left additionally. Nonobstructive bowel gas pattern. No abnormal intra-abdominal calcification. Negative osseous structures.  IMPRESSION: 1. Nonobstructive bowel gas pattern. No pneumoperitoneum. 2. Elevated right diaphragm, possibly from hepatomegaly. 3. Clear chest.   Electronically Signed   By: Tiburcio Pea   On: 10/27/2012 21:54    Medications: Scheduled Meds: . cefoTAXime (CLAFORAN) IV  1 g Intravenous Q8H  . feeding supplement  1 Container Oral TID BM  . folic acid  1 mg Oral Daily  . hydrocortisone  25 mg Rectal q morning - 10a  . LORazepam  0-4 mg Intravenous Q6H   Followed by  . [START ON 10/30/2012] LORazepam  0-4 mg Intravenous Q12H  . multivitamin with minerals  1 tablet Oral Daily  . pantoprazole (PROTONIX) IV  40 mg Intravenous Q12H  . sodium chloride  3 mL Intravenous Q12H  . thiamine  100 mg Oral Daily   Or  . thiamine  100 mg Intravenous Daily      LOS: 2 days   Danny Arnold M.D. [facility] 10/29/2012, 1:54 PM Pager: [phone]  If 7PM-7AM, please contact night-coverage [email or web address] Password [email or web address]

## 2012-10-30 ENCOUNTER — Inpatient Hospital Stay (HOSPITAL_COMMUNITY): Payer: No Typology Code available for payment source

## 2012-10-30 LAB — COMPREHENSIVE METABOLIC PANEL
ALT: 59 U/L — ABNORMAL HIGH (ref 0–53)
AST: 148 U/L — ABNORMAL HIGH (ref 0–37)
Albumin: 2.2 g/dL — ABNORMAL LOW (ref 3.5–5.2)
Alkaline Phosphatase: 152 U/L — ABNORMAL HIGH (ref 39–117)
Calcium: 8.7 mg/dL (ref 8.4–10.5)
Potassium: 2.9 mEq/L — ABNORMAL LOW (ref 3.5–5.1)
Sodium: 126 mEq/L — ABNORMAL LOW (ref 135–145)
Total Protein: 7.2 g/dL (ref 6.0–8.3)

## 2012-10-30 LAB — CBC
MCH: 35.1 pg — ABNORMAL HIGH (ref 26.0–34.0)
MCHC: 35.7 g/dL (ref 30.0–36.0)
Platelets: 287 10*3/uL (ref 150–400)

## 2012-10-30 LAB — GLUCOSE, CAPILLARY
Glucose-Capillary: 92 mg/dL (ref 70–99)
Glucose-Capillary: 95 mg/dL (ref 70–99)

## 2012-10-30 LAB — FOLATE: Folate: 6 ng/mL

## 2012-10-30 LAB — BASIC METABOLIC PANEL
CO2: 11 mEq/L — ABNORMAL LOW (ref 19–32)
Calcium: 8.7 mg/dL (ref 8.4–10.5)
Creatinine, Ser: 0.56 mg/dL (ref 0.50–1.35)
Glucose, Bld: 106 mg/dL — ABNORMAL HIGH (ref 70–99)

## 2012-10-30 LAB — VITAMIN B12: Vitamin B-12: 644 pg/mL (ref 211–911)

## 2012-10-30 MED ORDER — SODIUM CHLORIDE 0.9 % IV SOLN: INTRAVENOUS | Status: DC

## 2012-10-30 MED ORDER — POTASSIUM CHLORIDE 10 MEQ/100ML IV SOLN
10.0000 meq | INTRAVENOUS | Status: AC
  Administered 2012-10-30 (×5): 10 meq via INTRAVENOUS
  Filled 2012-10-30 (×5): qty 100

## 2012-10-30 MED ORDER — POTASSIUM CHLORIDE 10 MEQ/100ML IV SOLN
10.0000 meq | INTRAVENOUS | Status: AC
  Administered 2012-10-30 (×3): 10 meq via INTRAVENOUS
  Filled 2012-10-30 (×3): qty 100

## 2012-10-30 MED ORDER — POTASSIUM CHLORIDE CRYS ER 20 MEQ PO TBCR
40.0000 meq | EXTENDED_RELEASE_TABLET | ORAL | Status: AC
  Administered 2012-10-30 (×2): 40 meq via ORAL
  Filled 2012-10-30 (×2): qty 2

## 2012-10-30 NOTE — Progress Notes (Signed)
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 HIV-negative  - CT abdomen shows no evidence for cirrhosis although he has the portal hypertension, does not have any biliary obstruction on CT abdomen.  - GI was consulted, recommended continuing current workup and complete cessation of alcohol. Patient was explained through the interpreter line to stop alcohol drinking.  - Alkaline phosphatase is somewhat elevated with direct hyperbilirubinemia, no biliary obstruction. Ordered GGT, 5'NT. Rule out other causes, ordered anemia panel to assess ferritin level (rule out hemochromatosis). Rule out any hemolysis, ordered LDH and haptoglobin.   Hyponatremia: Improving, likely secondary to dehydration and alcoholic liver disease  - Continue to monitor, patient alert and oriented   History of alcohol abuse:  - he reports drinking 6-12 pack of beer every day, hard liquor for the last 2 months.  - Continue CIWA protocol  - Counseled strongly for alcohol cessation   Anemia: 2 episodes of rectal bleeding, patient also reported constipation and straining  - Continue Anusol suppository, avoid constipation   Leucocytosis  - Continue Cefotaxime, follow CBC      Code Status: full Family Communication: patient Disposition Plan:    Consultants:  GI  Procedures:    Antibiotics:    HPI/Subjective: Patient still having bright red blood with BMs  Objective: Filed Vitals:   10/30/12 0606  BP: 140/67  Pulse: 92  Temp: 98.4 F (36.9 C)  Resp: 18   No intake or output data in the 24 hours ending 10/30/12 1143 Filed Weights   10/27/12 2039 10/28/12 0347 10/30/12 0606  Weight: 73.936 kg (163 lb) 74.662 kg (164 lb 9.6 oz) 75.6 kg (166 lb 10.7 oz)    Exam:  General:  jaundiced HEENT: icteric sclera, PERLA, EOMI  CVS: S1-S2 clear, no murmur rubs or gallops  Chest: clear to auscultation bilaterally, no wheezing, rales or rhonchi  Abdomen: soft nontender,distended, normal bowel sounds  Extremities: no cyanosis, clubbing or edema noted bilaterally  Neuro: Cranial nerves II-XII intact, no focal neurological deficits   Data Reviewed: Basic Metabolic Panel:  Recent Labs Lab 10/27/12 2208 10/28/12 0735 10/28/12 1718 10/29/12 0515 10/30/12 0455  NA 124* 125*  --  128* 126*  K 2.8* 2.7* 3.4* 3.6 2.9*  CL 92* 98  --  102 101  CO2 16* 15*  --  13* 10*  GLUCOSE 109* 95  --  116* 98  BUN 12 9  --  11 11  CREATININE 0.58 0.52  --  0.68 0.63  CALCIUM 8.9 7.9*  --  8.4 8.7   Liver Function Tests:  Recent Labs Lab 10/27/12 2208 10/28/12 0735 10/29/12 0515 10/30/12 0455  AST 145* 129* 129* 148*  ALT 47 43 50 59*  ALKPHOS 140* 123* 136* 152*  BILITOT 40.2* 30.6* 33.9* 34.8*  PROT 7.9 6.6 6.8 7.2  ALBUMIN 2.5* 2.0* 2.0* 2.2*    Recent Labs Lab 10/27/12 2208  LIPASE 78*   No results found for this basename: AMMONIA,  in the last 168 hours CBC:  Recent Labs Lab 10/27/12 2208 10/28/12 0735 10/30/12 0455  WBC 17.8* 14.6* 18.3*  NEUTROABS 14.2* 12.1*  --   HGB 10.5* 9.5* 10.1*  HCT 28.6* 25.9* 28.3*  MCV 95.0 94.9 98.3  PLT 290 244 287   Cardiac Enzymes: No results found for this basename: CKTOTAL, CKMB, CKMBINDEX, TROPONINI,  in the last 168 hours BNP (last 3 results) No results found for this basename: PROBNP,  in the last 8760 hours CBG:  Recent Labs Lab 10/29/12 1620 10/29/12 1958 10/30/12 0002 10/30/12 0409 10/30/12 1134  GLUCAP 106* 95 92 95 106*    Recent Results (from the past 240 hour(s))  CLOSTRIDIUM DIFFICILE BY PCR     Status: None   Collection Time    10/28/12  5:01 AM      Result Value Range Status   C difficile by pcr NEGATIVE  NEGATIVE Final  STOOL CULTURE     Status: None   Collection Time    10/28/12   5:01 AM      Result Value Range Status   Specimen Description STOOL   Final   Special Requests NONE   Final   Culture     Final   Value: NO SUSPICIOUS COLONIES, CONTINUING TO HOLD     Performed at [facility]   Report Status PENDING   Incomplete     Studies: No results found.  Scheduled Meds: . cefoTAXime (CLAFORAN) IV  1 g Intravenous Q8H  . feeding supplement  1 Container Oral TID BM  . folic acid  1 mg Oral Daily  . hydrocortisone  25 mg Rectal q morning - 10a  . LORazepam  0-4 mg Intravenous Q12H  . multivitamin with minerals  1 tablet Oral Daily  . pantoprazole (PROTONIX) IV  40 mg Intravenous Q12H  . sodium chloride  3 mL Intravenous Q12H  . thiamine  100 mg Oral Daily   Or  . thiamine  100 mg Intravenous Daily   Continuous Infusions: . sodium chloride      Principal Problem:   Jaundice Active Problems:   Hyponatremia   Anemia   Leucocytosis    Time spent: 24    [facility], Danny Arnold  [facility] Pager [phone] If 7PM-7AM, please contact night-coverage at [email or web address], password [facility] 10/30/2012, 11:43 AM  LOS: 3 days

## 2012-10-30 NOTE — Progress Notes (Signed)
Pt with critical low CO2 level of 10 and potassium 2.9 . NP Schorr advised.

## 2012-10-30 NOTE — Progress Notes (Deleted)
Dr Benjamine Mola sent text re: lab report called - few staph aureus in synnovial fluid,

## 2012-10-31 DIAGNOSIS — R7881 Bacteremia: Secondary | ICD-10-CM

## 2012-10-31 DIAGNOSIS — B9689 Other specified bacterial agents as the cause of diseases classified elsewhere: Secondary | ICD-10-CM

## 2012-10-31 DIAGNOSIS — K839 Disease of biliary tract, unspecified: Secondary | ICD-10-CM

## 2012-10-31 DIAGNOSIS — K625 Hemorrhage of anus and rectum: Secondary | ICD-10-CM

## 2012-10-31 LAB — GLUCOSE, CAPILLARY
Glucose-Capillary: 115 mg/dL — ABNORMAL HIGH (ref 70–99)
Glucose-Capillary: 102 mg/dL — ABNORMAL HIGH (ref 70–99)
Glucose-Capillary: 104 mg/dL — ABNORMAL HIGH (ref 70–99)

## 2012-10-31 LAB — CBC
HCT: 26.7 % — ABNORMAL LOW (ref 39.0–52.0)
MCHC: 36.3 g/dL — ABNORMAL HIGH (ref 30.0–36.0)
MCV: 97.8 fL (ref 78.0–100.0)
RDW: 24.7 % — ABNORMAL HIGH (ref 11.5–15.5)
WBC: 17.3 10*3/uL — ABNORMAL HIGH (ref 4.0–10.5)

## 2012-10-31 LAB — COMPREHENSIVE METABOLIC PANEL
ALT: 61 U/L — ABNORMAL HIGH (ref 0–53)
Albumin: 2 g/dL — ABNORMAL LOW (ref 3.5–5.2)
Alkaline Phosphatase: 163 U/L — ABNORMAL HIGH (ref 39–117)
Potassium: 3.8 mEq/L (ref 3.5–5.1)
Sodium: 125 mEq/L — ABNORMAL LOW (ref 135–145)
Total Protein: 6.9 g/dL (ref 6.0–8.3)

## 2012-10-31 LAB — STOOL CULTURE

## 2012-10-31 MED ORDER — DOCUSATE SODIUM 100 MG PO CAPS
100.0000 mg | ORAL_CAPSULE | Freq: Two times a day (BID) | ORAL | Status: DC
  Administered 2012-10-31 – 2012-11-05 (×11): 100 mg via ORAL
  Filled 2012-10-31 (×13): qty 1

## 2012-10-31 NOTE — Progress Notes (Signed)
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 HIV-negative  - CT abdomen shows no evidence for cirrhosis although he has the portal hypertension, does not have any biliary obstruction on CT abdomen.  - GI was consulted, recommended continuing current workup and complete cessation of alcohol. Patient was explained through the interpreter line to stop alcohol drinking.  - Alkaline phosphatase is somewhat elevated with direct hyperbilirubinemia, no biliary obstruction. Ordered GGT, 5'NT. Rule out other causes, ordered anemia panel to assess ferritin level (elevated at 414).  -? If needs steroids- defer to GI  Hyponatremia: ? dehydration and alcoholic liver disease; IVF - Continue to monitor, patient alert and oriented   History of alcohol abuse:  - he reports drinking 6-12 pack of beer every day, hard liquor for the last 2 months.  - Continue CIWA protocol  - Counseled strongly for alcohol cessation   Anemia: 2 episodes of rectal bleeding, patient also reported constipation and straining  - Continue Anusol suppository, avoid constipation   Leucocytosis  - Continue Cefotaxime, follow CBC      Code Status: full Family Communication: patient Disposition Plan:    Consultants:  GI  Procedures:    Antibiotics:    HPI/Subjective: Feeling better today  Objective: Filed Vitals:   10/31/12 0554  BP: 125/69  Pulse: 88  Temp: 98.4 F (36.9 C)  Resp: 16   No intake or output data in the 24 hours ending 10/31/12 1001 Filed Weights   10/27/12 2039 10/28/12 0347 10/30/12 0606  Weight: 73.936 kg (163 lb) 74.662 kg (164 lb 9.6 oz) 75.6 kg (166 lb 10.7 oz)    Exam:  General: jaundiced HEENT: icteric sclera, PERLA, EOMI  CVS: S1-S2 clear, no murmur rubs or gallops   Chest: clear to auscultation bilaterally, no wheezing, rales or rhonchi  Abdomen: soft nontender,distended, normal bowel sounds  Extremities: no cyanosis, clubbing or edema noted bilaterally  Neuro: Cranial nerves II-XII intact, no focal neurological deficits   Data Reviewed: Basic Metabolic Panel:  Recent Labs Lab 10/28/12 0735 10/28/12 1718 10/29/12 0515 10/30/12 0455 10/30/12 1413 10/30/12 1545 10/31/12 0640  NA 125*  --  128* 126*  --  126* 125*  K 2.7* 3.4* 3.6 2.9*  --  2.9* 3.8  CL 98  --  102 101  --  103 105  CO2 15*  --  13* 10*  --  11* 8*  GLUCOSE 95  --  116* 98  --  106* 110*  BUN 9  --  11 11  --  11 12  CREATININE 0.52  --  0.68 0.63  --  0.56 0.66  CALCIUM 7.9*  --  8.4 8.7  --  8.7 8.5  MG  --   --   --   --  2.8*  --   --    Liver Function Tests:  Recent Labs Lab 10/27/12 2208 10/28/12 0735 10/29/12 0515 10/30/12 0455 10/31/12 0640  AST 145* 129* 129* 148* 148*  ALT 47 43 50 59* 61*  ALKPHOS 140* 123* 136* 152* 163*  BILITOT 40.2* 30.6* 33.9* 34.8* 33.6*  PROT 7.9 6.6 6.8 7.2 6.9  ALBUMIN 2.5* 2.0* 2.0* 2.2* 2.0*    Recent Labs Lab 10/27/12 2208  LIPASE 78*   No results found for this basename: AMMONIA,  in the last 168 hours CBC:  Recent Labs Lab 10/27/12 2208 10/28/12 0735 10/30/12 0455 10/31/12 0640  WBC 17.8* 14.6* 18.3* 17.3*  NEUTROABS 14.2* 12.1*  --   --   HGB 10.5* 9.5* 10.1* 9.7*  HCT 28.6* 25.9* 28.3* 26.7*  MCV 95.0 94.9 98.3 97.8  PLT 290 244 287 284   Cardiac Enzymes: No results found for this basename: CKTOTAL, CKMB, CKMBINDEX, TROPONINI,  in the last 168 hours BNP (last 3 results) No results found for this basename: PROBNP,  in the last 8760 hours CBG:  Recent Labs Lab 10/30/12 0409 10/30/12 1134 10/30/12 2010 10/31/12 0022 10/31/12 0416  GLUCAP 95 106* 98 111* 115*    Recent Results (from the past 240 hour(s))  CLOSTRIDIUM DIFFICILE BY PCR     Status: None   Collection Time    10/28/12  5:01 AM       Result Value Range Status   C difficile by pcr NEGATIVE  NEGATIVE Final  STOOL CULTURE     Status: None   Collection Time    10/28/12  5:01 AM      Result Value Range Status   Specimen Description STOOL   Final   Special Requests NONE   Final   Culture     Final   Value: NO SALMONELLA, SHIGELLA, CAMPYLOBACTER, YERSINIA, OR E.COLI 0157:H7 ISOLATED     Performed at [facility]   Report Status 10/31/2012 FINAL   Final     Studies: US Abdomen Complete  10/30/2012   *RADIOLOGY REPORT*  Clinical Data: Increasing liver enzymes.  ABDOMEN ULTRASOUND  Technique:  Complete abdominal ultrasound examination was performed including evaluation of the liver, gallbladder, bile ducts, pancreas, kidneys, spleen, IVC, and abdominal aorta.  Comparison: CT scan from 10/28/2012  Findings:  Gallbladder:  Gallbladder wall is thickened at 6 mm and shows intramural edema.  No evidence for pericholecystic fluid. Tumefactive sludge is noted within the lumen but no definite gallstones are evident.  The sonographer reports no sonographic Murphy's sign.  Common Bile Duct:  Nondilated at 3 mm diameter.  Liver:  Echogenic liver suggests fatty infiltration.  Liver is noted to be enlarged on the recent CT scan.  No intrahepatic biliary duct dilatation.  IVC:  Normal.  Pancreas:  Not well seen secondary overlying midline bowel gas.  Spleen:  Normal.  Right kidney:  13.2 cm in long axis.  Normal.  Left kidney:  12.3 cm in long axis.  Normal.  Abdominal Aorta:  No aneurysm is evident.  No evidence for ascites.  IMPRESSION: Gallbladder sludge with gallbladder wall thickening and intramural edema.  No gallstones are evident, but acute cholecystitis cannot be excluded by imaging.  In the setting of liver disease, gallbladder wall thickening can be nonacute, there is no evidence for associated ascites on the current study.  Nuclear scintigraphy may prove helpful to evaluate for acute cholecystitis as clinically warranted.    Original Report Authenticated By: Kennith Center, M.D.   Dg Abd 2 Views  10/30/2012   CLINICAL DATA:  Mid abdominal pain and pressure  EXAM: ABDOMEN - 2 VIEW  COMPARISON:  CT abdomen 10/28/2012  FINDINGS: The liver is enlarged similar to comparison CT. There are no dilated loops of large or small bowel. Moderate volume of stool in the ascending colon. Gas in the rectum. No pathologic calcifications.  IMPRESSION: 1. Hepatomegaly.  2. No evidence of bowel obstruction.   Electronically Signed   By: Genevive Bi M.D.   On: 10/30/2012 16:50    Scheduled Meds: .  cefoTAXime (CLAFORAN) IV  1 g Intravenous Q8H  . docusate sodium  100 mg Oral BID  . feeding supplement  1 Container Oral TID BM  . folic acid  1 mg Oral Daily  . hydrocortisone  25 mg Rectal q morning - 10a  . LORazepam  0-4 mg Intravenous Q12H  . multivitamin with minerals  1 tablet Oral Daily  . pantoprazole (PROTONIX) IV  40 mg Intravenous Q12H  . sodium chloride  3 mL Intravenous Q12H  . thiamine  100 mg Oral Daily   Or  . thiamine  100 mg Intravenous Daily   Continuous Infusions: . sodium chloride 75 mL/hr (10/30/12 1115)    Principal Problem:   Jaundice Active Problems:   Hyponatremia   Anemia   Leucocytosis    Time spent: 35    [facility], Kaitlen Redford  [facility] Pager [phone] If 7PM-7AM, please contact night-coverage at [email or web address], password [facility] 10/31/2012, 10:01 AM  LOS: 4 days

## 2012-10-31 NOTE — Consult Note (Signed)
INFECTIOUS DISEASE CONSULT NOTE  Date of Admission:  10/27/2012  Date of Consult:  10/31/2012  Reason for Consult: Gram negative Bacteremia Referring Physician: Benjamine Mola  Impression/Recommendation Gram negative bacteremia secondary to liver disease/biliary disease  Leucocytosis Jaundice: Alcoholic hepatitis  Hyponatremia Anemia/rectal bleeding  Would:  Await ID of GNR Continue cefotaxime Check HIV RNA, Hep C RNA Recheck BCx in AM  Comment- Suspect that his bacteremia is due to his liver dysfunction (normally acts as clearing house for GI toxins). His viral serologies are (-). Hopefully can change his anbx soon.    Thank you so much for this interesting consult,   Rica Koyanagi (pager) [phone] [email or web address]  Danny Arnold is an 37 y.o. male.  HPI: 37 yo M Spanish-only speaking gentleman with background history of heavy alcohol use, presented to the  ER on 10/27/12 because he noticed himself becoming yellowish. Patient also had also been having constipation and nausea and tenesmus. In the ER patient was found to be jaundiced with blood work showing bilirubin around 40 with CT abdomen and pelvis done with contrast showing signs of portal hypertension and hepatomegaly.  Patient said to have a pale stool with frank blood while in ED.  Denied previous blood in stool. Negative Hep C panel 9/15. Started on cefotaxime 9/15. An U/S of the abdomen on 9/17 showed gallbladder sludge with gallbladder wall thickening and intramural edema no gallstones were evident, but acute cholecystitis was not excluded as a diagnosis excluded. Blood culture taken on 9/17 showed gram (-)rods. Latest CBC 9/18 showed elevated WBC(17.3) HB (10.1), Chem showed hyponatremia(125).  PT time elevated(17.7), abnormal LFT's (ALT, AST,ALP bilirubin elevated). From previous notes  patient admited to drinking alcohol everyday  but denied having used any IV drugs.    History reviewed. No pertinent past  medical history.  Past Surgical History  Procedure Laterality Date  . Hand surgery       No Known Allergies  Medications:  Scheduled: . cefoTAXime (CLAFORAN) IV  1 g Intravenous Q8H  . docusate sodium  100 mg Oral BID  . feeding supplement  1 Container Oral TID BM  . folic acid  1 mg Oral Daily  . hydrocortisone  25 mg Rectal q morning - 10a  . LORazepam  0-4 mg Intravenous Q12H  . multivitamin with minerals  1 tablet Oral Daily  . pantoprazole (PROTONIX) IV  40 mg Intravenous Q12H  . sodium chloride  3 mL Intravenous Q12H  . thiamine  100 mg Oral Daily   Or  . thiamine  100 mg Intravenous Daily    Total days of antibiotics cefotaxime 3          Social History:  reports that he has never smoked. He does not have any smokeless tobacco history on file. He reports that  drinks alcohol. He reports that he does not use illicit drugs.  ? Cans of beer/day  Family History  Problem Relation Age of Onset  . Migraines Mother     General ROS: icterus for 1 week, occas cough. from [place], in [place] for [place]yrs (?), family in [place] (no health problems), no diarrhea, no dysuria, no dysphagia. see HPI  Blood pressure 125/69, pulse 88, temperature 98.4 F (36.9 C), temperature source Oral, resp. rate 16, height 5\' 7"  (1.702 m), weight 75.6 kg (166 lb 10.7 oz), SpO2 99.00%. General appearance: alert, cooperative and icteric Head: Normocephalic, without obvious abnormality Heart: regular rate and rhythm, S1, S2 normal, no murmur, click, rub or gallop Abdomen: distended,  hepatomegally to 5cm, BS+, soft nontender.  Extremities: extremities normal, atraumatic, no cyanosis or edema No cervical LAN   Results for orders placed during the hospital encounter of 10/27/12 (from the past 48 hour(s))  GAMMA GT     Status: Abnormal   Collection Time    10/29/12 11:50 AM      Result Value Range   GGT 78 (*) 7 - 51 U/L   Comment: ICTERUS AT THIS LEVEL MAY AFFECT RESULT  LACTATE DEHYDROGENASE      Status: None   Collection Time    10/29/12 11:50 AM      Result Value Range   LDH 220  94 - 250 U/L  HAPTOGLOBIN     Status: None   Collection Time    10/29/12 11:50 AM      Result Value Range   Haptoglobin 136  45 - 215 mg/dL   Comment: Performed at [facility]  VITAMIN B12     Status: None   Collection Time    10/29/12 11:50 AM      Result Value Range   Vitamin B-12 644  211 - 911 pg/mL   Comment: Performed at [facility]  FOLATE     Status: None   Collection Time    10/29/12 11:50 AM      Result Value Range   Folate 6.0     Comment: (NOTE)     Reference Ranges            Deficient:       0.4 - 3.3 ng/mL            Indeterminate:   3.4 - 5.4 ng/mL            Normal:              > 5.4 ng/mL     Performed at [facility]  IRON AND TIBC     Status: Abnormal   Collection Time    10/29/12 11:50 AM      Result Value Range   Iron 69  42 - 135 ug/dL   TIBC 161 (*) 096 - 045 ug/dL   Saturation Ratios 48  20 - 55 %   UIBC 74 (*) 125 - 400 ug/dL   Comment: Performed at [facility]  FERRITIN     Status: Abnormal   Collection Time    10/29/12 11:50 AM      Result Value Range   Ferritin 414 (*) 22 - 322 ng/mL   Comment: Performed at [facility]  RETICULOCYTES     Status: Abnormal   Collection Time    10/29/12 11:50 AM      Result Value Range   Retic Ct Pct 5.0 (*) 0.4 - 3.1 %   RBC. 2.79 (*) 4.22 - 5.81 MIL/uL   Retic Count, Manual 139.5  19.0 - 186.0 K/uL  GLUCOSE, CAPILLARY     Status: Abnormal   Collection Time    10/29/12  4:20 PM      Result Value Range   Glucose-Capillary 106 (*) 70 - 99 mg/dL   Comment 1 Notify RN     Comment 2 Documented in Chart    GLUCOSE, CAPILLARY     Status: None   Collection Time    10/29/12  7:58 PM      Result Value Range   Glucose-Capillary 95  70 - 99 mg/dL  GLUCOSE, CAPILLARY     Status: None  Collection Time    10/30/12 12:02 AM      Result Value Range   Glucose-Capillary 92  70 -  99 mg/dL  GLUCOSE, CAPILLARY     Status: None   Collection Time    10/30/12  4:09 AM      Result Value Range   Glucose-Capillary 95  70 - 99 mg/dL  CBC     Status: Abnormal   Collection Time    10/30/12  4:55 AM      Result Value Range   WBC 18.3 (*) 4.0 - 10.5 K/uL   RBC 2.88 (*) 4.22 - 5.81 MIL/uL   Hemoglobin 10.1 (*) 13.0 - 17.0 g/dL   HCT 40.9 (*) 81.1 - 91.4 %   MCV 98.3  78.0 - 100.0 fL   MCH 35.1 (*) 26.0 - 34.0 pg   MCHC 35.7  30.0 - 36.0 g/dL   RDW 78.2 (*) 95.6 - 21.3 %   Platelets 287  150 - 400 K/uL  COMPREHENSIVE METABOLIC PANEL     Status: Abnormal   Collection Time    10/30/12  4:55 AM      Result Value Range   Sodium 126 (*) 135 - 145 mEq/L   Potassium 2.9 (*) 3.5 - 5.1 mEq/L   Chloride 101  96 - 112 mEq/L   CO2 10 (*) 19 - 32 mEq/L   Comment: CRITICAL RESULT CALLED TO, READ BACK BY AND VERIFIED WITH:     WAYNE GREGSON,RN AT 0865 10/30/12 BY ZBEECH.   Glucose, Bld 98  70 - 99 mg/dL   BUN 11  6 - 23 mg/dL   Creatinine, Ser 7.84  0.50 - 1.35 mg/dL   Comment: ICTERUS AT THIS LEVEL MAY AFFECT RESULT   Calcium 8.7  8.4 - 10.5 mg/dL   Total Protein 7.2  6.0 - 8.3 g/dL   Comment: ICTERUS AT THIS LEVEL MAY AFFECT RESULT   Albumin 2.2 (*) 3.5 - 5.2 g/dL   AST 696 (*) 0 - 37 U/L   ALT 59 (*) 0 - 53 U/L   Alkaline Phosphatase 152 (*) 39 - 117 U/L   Total Bilirubin 34.8 (*) 0.3 - 1.2 mg/dL   Comment: CRITICAL VALUE NOTED.  VALUE IS CONSISTENT WITH PREVIOUSLY REPORTED AND CALLED VALUE.   GFR calc non Af Amer >90  >90 mL/min   GFR calc Af Amer >90  >90 mL/min   Comment: (NOTE)     The eGFR has been calculated using the CKD EPI equation.     This calculation has not been validated in all clinical situations.     eGFR's persistently <90 mL/min signify possible Chronic Kidney     Disease.  GLUCOSE, CAPILLARY     Status: Abnormal   Collection Time    10/30/12 11:34 AM      Result Value Range   Glucose-Capillary 106 (*) 70 - 99 mg/dL  MAGNESIUM     Status: Abnormal     Collection Time    10/30/12  2:13 PM      Result Value Range   Magnesium 2.8 (*) 1.5 - 2.5 mg/dL  LACTIC ACID, PLASMA     Status: None   Collection Time    10/30/12  2:14 PM      Result Value Range   Lactic Acid, Venous 1.0  0.5 - 2.2 mmol/L   Comment: ICTERUS AT THIS LEVEL MAY AFFECT RESULT  CULTURE, BLOOD (ROUTINE X 2)     Status: None   Collection  Time    10/30/12  3:40 PM      Result Value Range   Specimen Description BLOOD BLOOD RIGHT FOREARM     Special Requests BOTTLES DRAWN AEROBIC AND ANAEROBIC 10CC EACH     Culture  Setup Time       Value: 10/30/2012 22:08     Performed at [facility]   Culture       Value: GRAM NEGATIVE RODS     Note: Gram Stain Report Called to,Read Back By and Verified With: MARGARET CASSEL 10/31/12 1105 BY SMITHERSJ     Performed at [facility]   Report Status PENDING    BASIC METABOLIC PANEL     Status: Abnormal   Collection Time    10/30/12  3:45 PM      Result Value Range   Sodium 126 (*) 135 - 145 mEq/L   Potassium 2.9 (*) 3.5 - 5.1 mEq/L   Chloride 103  96 - 112 mEq/L   CO2 11 (*) 19 - 32 mEq/L   Glucose, Bld 106 (*) 70 - 99 mg/dL   BUN 11  6 - 23 mg/dL   Creatinine, Ser 1.61  0.50 - 1.35 mg/dL   Comment: ICTERUS AT THIS LEVEL MAY AFFECT RESULT   Calcium 8.7  8.4 - 10.5 mg/dL   GFR calc non Af Amer >90  >90 mL/min   GFR calc Af Amer >90  >90 mL/min   Comment: (NOTE)     The eGFR has been calculated using the CKD EPI equation.     This calculation has not been validated in all clinical situations.     eGFR's persistently <90 mL/min signify possible Chronic Kidney     Disease.  GLUCOSE, CAPILLARY     Status: None   Collection Time    10/30/12  8:10 PM      Result Value Range   Glucose-Capillary 98  70 - 99 mg/dL  GLUCOSE, CAPILLARY     Status: Abnormal   Collection Time    10/31/12 12:22 AM      Result Value Range   Glucose-Capillary 111 (*) 70 - 99 mg/dL  GLUCOSE, CAPILLARY     Status: Abnormal   Collection  Time    10/31/12  4:16 AM      Result Value Range   Glucose-Capillary 115 (*) 70 - 99 mg/dL  COMPREHENSIVE METABOLIC PANEL     Status: Abnormal   Collection Time    10/31/12  6:40 AM      Result Value Range   Sodium 125 (*) 135 - 145 mEq/L   Potassium 3.8  3.5 - 5.1 mEq/L   Comment: DELTA CHECK NOTED     ICTERIC SPECIMEN   Chloride 105  96 - 112 mEq/L   CO2 8 (*) 19 - 32 mEq/L   Comment: CRITICAL RESULT CALLED TO, READ BACK BY AND VERIFIED WITH:     CASSEL M RN 10/31/12 0814 COSTELLO B   Glucose, Bld 110 (*) 70 - 99 mg/dL   BUN 12  6 - 23 mg/dL   Creatinine, Ser 0.96  0.50 - 1.35 mg/dL   Comment: ICTERUS AT THIS LEVEL MAY AFFECT RESULT   Calcium 8.5  8.4 - 10.5 mg/dL   Total Protein 6.9  6.0 - 8.3 g/dL   Comment: ICTERUS AT THIS LEVEL MAY AFFECT RESULT   Albumin 2.0 (*) 3.5 - 5.2 g/dL   AST 045 (*) 0 - 37 U/L   ALT 61 (*) 0 -  53 U/L   Alkaline Phosphatase 163 (*) 39 - 117 U/L   Total Bilirubin 33.6 (*) 0.3 - 1.2 mg/dL   Comment: CRITICAL VALUE NOTED.  VALUE IS CONSISTENT WITH PREVIOUSLY REPORTED AND CALLED VALUE.   GFR calc non Af Amer >90  >90 mL/min   GFR calc Af Amer >90  >90 mL/min   Comment: (NOTE)     The eGFR has been calculated using the CKD EPI equation.     This calculation has not been validated in all clinical situations.     eGFR's persistently <90 mL/min signify possible Chronic Kidney     Disease.  CBC     Status: Abnormal   Collection Time    10/31/12  6:40 AM      Result Value Range   WBC 17.3 (*) 4.0 - 10.5 K/uL   RBC 2.73 (*) 4.22 - 5.81 MIL/uL   Hemoglobin 9.7 (*) 13.0 - 17.0 g/dL   HCT 19.1 (*) 47.8 - 29.5 %   MCV 97.8  78.0 - 100.0 fL   MCH 35.5 (*) 26.0 - 34.0 pg   MCHC 36.3 (*) 30.0 - 36.0 g/dL   RDW 62.1 (*) 30.8 - 65.7 %   Platelets 284  150 - 400 K/uL      Component Value Date/Time   SDES BLOOD BLOOD RIGHT FOREARM 10/30/2012 1540   SPECREQUEST BOTTLES DRAWN AEROBIC AND ANAEROBIC 10CC EACH 10/30/2012 1540   CULT  Value: GRAM NEGATIVE RODS  Note: Gram Stain Report Called to,Read Back By and Verified With: MARGARET CASSEL 10/31/12 1105 BY SMITHERSJ Performed at [facility] 10/30/2012 1540   REPTSTATUS PENDING 10/30/2012 1540   US Abdomen Complete  10/30/2012   *RADIOLOGY REPORT*  Clinical Data: Increasing liver enzymes.  ABDOMEN ULTRASOUND  Technique:  Complete abdominal ultrasound examination was performed including evaluation of the liver, gallbladder, bile ducts, pancreas, kidneys, spleen, IVC, and abdominal aorta.  Comparison: CT scan from 10/28/2012  Findings:  Gallbladder:  Gallbladder wall is thickened at 6 mm and shows intramural edema.  No evidence for pericholecystic fluid. Tumefactive sludge is noted within the lumen but no definite gallstones are evident.  The sonographer reports no sonographic Murphy's sign.  Common Bile Duct:  Nondilated at 3 mm diameter.  Liver:  Echogenic liver suggests fatty infiltration.  Liver is noted to be enlarged on the recent CT scan.  No intrahepatic biliary duct dilatation.  IVC:  Normal.  Pancreas:  Not well seen secondary overlying midline bowel gas.  Spleen:  Normal.  Right kidney:  13.2 cm in long axis.  Normal.  Left kidney:  12.3 cm in long axis.  Normal.  Abdominal Aorta:  No aneurysm is evident.  No evidence for ascites.  IMPRESSION: Gallbladder sludge with gallbladder wall thickening and intramural edema.  No gallstones are evident, but acute cholecystitis cannot be excluded by imaging.  In the setting of liver disease, gallbladder wall thickening can be nonacute, there is no evidence for associated ascites on the current study.  Nuclear scintigraphy may prove helpful to evaluate for acute cholecystitis as clinically warranted.   Original Report Authenticated By: Kennith Center, M.D.   Dg Abd 2 Views  10/30/2012   CLINICAL DATA:  Mid abdominal pain and pressure  EXAM: ABDOMEN - 2 VIEW  COMPARISON:  CT abdomen 10/28/2012  FINDINGS: The liver is enlarged similar to comparison CT. There are no  dilated loops of large or small bowel. Moderate volume of stool in the ascending colon. Gas in the  rectum. No pathologic calcifications.  IMPRESSION: 1. Hepatomegaly.  2. No evidence of bowel obstruction.   Electronically Signed   By: Genevive Bi M.D.   On: 10/30/2012 16:50   Recent Results (from the past 240 hour(s))  CLOSTRIDIUM DIFFICILE BY PCR     Status: None   Collection Time    10/28/12  5:01 AM      Result Value Range Status   C difficile by pcr NEGATIVE  NEGATIVE Final  STOOL CULTURE     Status: None   Collection Time    10/28/12  5:01 AM      Result Value Range Status   Specimen Description STOOL   Final   Special Requests NONE   Final   Culture     Final   Value: NO SALMONELLA, SHIGELLA, CAMPYLOBACTER, YERSINIA, OR E.COLI 0157:H7 ISOLATED     Performed at [facility]   Report Status 10/31/2012 FINAL   Final  CULTURE, BLOOD (ROUTINE X 2)     Status: None   Collection Time    10/30/12  3:40 PM      Result Value Range Status   Specimen Description BLOOD BLOOD RIGHT FOREARM   Final   Special Requests BOTTLES DRAWN AEROBIC AND ANAEROBIC 10CC EACH   Final   Culture  Setup Time     Final   Value: 10/30/2012 22:08     Performed at [facility]   Culture     Final   Value: GRAM NEGATIVE RODS     Note: Gram Stain Report Called to,Read Back By and Verified With: MARGARET CASSEL 10/31/12 1105 BY SMITHERSJ     Performed at [facility]   Report Status PENDING   Incomplete      10/31/2012, 11:38 AM     LOS: 4 days

## 2012-11-01 LAB — HIV-1 RNA QUANT-NO REFLEX-BLD
HIV 1 RNA Quant: <20 copies/mL (ref <20)
HIV-1 RNA Quant, Log: <1.3 {Log} (ref <1.30)

## 2012-11-01 LAB — GLUCOSE, CAPILLARY: Glucose-Capillary: 120 mg/dL — ABNORMAL HIGH (ref 70–99)

## 2012-11-01 LAB — COMPREHENSIVE METABOLIC PANEL
ALT: 64 U/L — ABNORMAL HIGH (ref 0–53)
AST: 148 U/L — ABNORMAL HIGH (ref 0–37)
Alkaline Phosphatase: 177 U/L — ABNORMAL HIGH (ref 39–117)
CO2: 8 mEq/L — CL (ref 19–32)
Chloride: 105 mEq/L (ref 96–112)
GFR calc Af Amer: >90 mL/min (ref >90)
GFR calc non Af Amer: >90 mL/min (ref >90)
Glucose, Bld: 104 mg/dL — ABNORMAL HIGH (ref 70–99)
Sodium: 128 mEq/L — ABNORMAL LOW (ref 135–145)
Total Bilirubin: 31.8 mg/dL (ref 0.3–1.2)

## 2012-11-01 LAB — CBC
Hemoglobin: 9.2 g/dL — ABNORMAL LOW (ref 13.0–17.0)
MCH: 35.9 pg — ABNORMAL HIGH (ref 26.0–34.0)
MCHC: 36.5 g/dL — ABNORMAL HIGH (ref 30.0–36.0)
Platelets: 322 10*3/uL (ref 150–400)
RDW: 24.8 % — ABNORMAL HIGH (ref 11.5–15.5)

## 2012-11-01 LAB — HEPATITIS C VRS RNA DETECT BY PCR-QUAL: Hepatitis C Vrs RNA by PCR-Qual: NEGATIVE

## 2012-11-01 MED ORDER — DEXTROSE 5 % IV SOLN
2.0000 g | Freq: Three times a day (TID) | INTRAVENOUS | Status: DC
  Administered 2012-11-01 – 2012-11-02 (×3): 2 g via INTRAVENOUS
  Filled 2012-11-01 (×5): qty 2

## 2012-11-01 MED ORDER — POTASSIUM CHLORIDE CRYS ER 20 MEQ PO TBCR
40.0000 meq | EXTENDED_RELEASE_TABLET | Freq: Once | ORAL | Status: AC
  Administered 2012-11-01: 40 meq via ORAL
  Filled 2012-11-01: qty 2

## 2012-11-01 NOTE — Progress Notes (Signed)
[facility]    Subjective: No new complaints   Antibiotics:  Anti-infectives   Start     Dose/Rate Route Frequency Ordered Stop   11/01/12 1430  ceFEPIme (MAXIPIME) 2 g in dextrose 5 % 50 mL IVPB     2 g 100 mL/hr over 30 Minutes Intravenous 3 times per day 11/01/12 1422     10/28/12 0600  cefoTAXime (CLAFORAN) 1 g in dextrose 5 % 50 mL IVPB  Status:  Discontinued     1 g 100 mL/hr over 30 Minutes Intravenous 3 times per day 10/28/12 0454 11/01/12 1422      Medications: Scheduled Meds: . ceFEPime (MAXIPIME) IV  2 g Intravenous Q8H  . docusate sodium  100 mg Oral BID  . feeding supplement  1 Container Oral TID BM  . folic acid  1 mg Oral Daily  . hydrocortisone  25 mg Rectal q morning - 10a  . multivitamin with minerals  1 tablet Oral Daily  . pantoprazole (PROTONIX) IV  40 mg Intravenous Q12H  . sodium chloride  3 mL Intravenous Q12H  . thiamine  100 mg Oral Daily   Or  . thiamine  100 mg Intravenous Daily   Continuous Infusions: . sodium chloride 75 mL/hr (10/30/12 1115)   PRN Meds:.ondansetron (ZOFRAN) IV, ondansetron, polyethylene glycol   Objective: Weight change:   Intake/Output Summary (Last 24 hours) at 11/01/12 1904 Last data filed at 11/01/12 0536  Gross per 24 hour  Intake     50 ml  Output    800 ml  Net   -750 ml   Blood pressure 104/51, pulse 87, temperature 98.6 F (37 C), temperature source Oral, resp. rate 18, height 5\' 7"  (1.702 m), weight 166 lb 3.2 oz (75.388 kg), SpO2 100.00%. Temp:  [98.6 F (37 C)-98.7 F (37.1 C)] 98.6 F (37 C) (09/19 1357) Pulse Rate:  [87-99] 87 (09/19 1357) Resp:  [18] 18 (09/19 1357) BP: (104-134)/(51-68) 104/51 mmHg (09/19 1357) SpO2:  [99 %-100 %] 100 % (09/19 1357) Weight:  [166 lb 3.2 oz (75.388 kg)] 166 lb 3.2 oz (75.388 kg) (09/19 0500)  Physical Exam: General: Alert and awake, oriented   HEENT: ICTERIC sclera, pupils reactive to light and accommodation, EOMI CVS regular  rate, normal r,  no murmur rubs or gallops Chest: clear to auscultation bilaterally, no wheezing, rales or rhonchi Abdomen: distended +bs, diffuse tenderness,  normal bowel sounds, Extremities: no  clubbing or edema noted bilaterally Skin: jaundiced Neuro: nonfocal  Lab Results:  Recent Labs  10/31/12 0640 11/01/12 0630  WBC 17.3* 19.6*  HGB 9.7* 9.2*  HCT 26.7* 25.2*  PLT 284 322    BMET  Recent Labs  10/31/12 0640 11/01/12 0630  NA 125* 128*  K 3.8 3.2*  CL 105 105  CO2 8* 8*  GLUCOSE 110* 104*  BUN 12 11  CREATININE 0.66 0.63  CALCIUM 8.5 8.4    Micro Results: Recent Results (from the past 240 hour(s))  CLOSTRIDIUM DIFFICILE BY PCR     Status: None   Collection Time    10/28/12  5:01 AM      Result Value Range Status   C difficile by pcr NEGATIVE  NEGATIVE Final  STOOL CULTURE     Status: None   Collection Time    10/28/12  5:01 AM      Result Value Range Status   Specimen Description STOOL   Final   Special Requests NONE  Final   Culture     Final   Value: NO SALMONELLA, SHIGELLA, CAMPYLOBACTER, YERSINIA, OR E.COLI 0157:H7 ISOLATED     Performed at [facility]   Report Status 10/31/2012 FINAL   Final  CULTURE, BLOOD (ROUTINE X 2)     Status: None   Collection Time    10/30/12  3:40 PM      Result Value Range Status   Specimen Description BLOOD BLOOD RIGHT FOREARM   Final   Special Requests BOTTLES DRAWN AEROBIC AND ANAEROBIC 10CC EACH   Final   Culture  Setup Time     Final   Value: 10/30/2012 22:08     Performed at [facility]   Culture     Final   Value: GRAM NEGATIVE RODS     Note: Gram Stain Report Called to,Read Back By and Verified With: MARGARET CASSEL 10/31/12 1105 BY SMITHERSJ     Performed at [facility]   Report Status PENDING   Incomplete    Studies/Results: No results found.    Assessment/Plan: Danny Arnold is a 37 y.o. male with  Liver disease, alcoholic hepatitis, GNR bacteremia likely from  biliary source possible cholangitis   #1 GNR bactermia: change to cefepime for pseudmonal coverage. ? ID not yet back  #2  Alcoholic hepatitis with severe hyperbilirubinemia is NOT a good prognostic finding. GI involved.  -would also check Hep C RNA   LOS: 5 days   Danny Arnold 11/01/2012, 7:04 PM

## 2012-11-01 NOTE — Progress Notes (Signed)
Utilization review completed.  

## 2012-11-01 NOTE — Progress Notes (Signed)
[facility] PROGRESS NOTE  Danny Arnold FAO:[id number] DOB: May 22, 1975 DOA: 10/27/2012 PCP: No PCP Per Patient  Assessment/Plan:  Jaundice: Acute viral/alcoholic hepatitis on alcoholic liver disease  - Workup in progress, Hepatitis panel neg. HIV-negative  - CT abdomen shows no evidence for cirrhosis although he has the portal hypertension, does not have any biliary obstruction on CT abdomen.  - GI was consulted, recommended continuing current workup and complete cessation of alcohol. Patient was explained through the interpreter line to stop alcohol drinking.  - Alkaline phosphatase is somewhat elevated with direct hyperbilirubinemia, no biliary obstruction. Ordered GGT, 5'NT. Rule out other causes, ordered anemia panel to assess ferritin level (elevated at 414).  -would add 40 mg of prednisone but blood culture was positive for gram neg rod- ID consulted and repeat culture ordered  Hyponatremia: ? dehydration and alcoholic liver disease; IVF - Continue to monitor, patient alert and oriented   History of alcohol abuse:  - he reports drinking 6-12 pack of beer every day, hard liquor for the last 2 months.  - Continue CIWA protocol  - Counseled strongly for alcohol cessation   Anemia: 2 episodes of rectal bleeding, patient also reported constipation and straining  - Continue Anusol suppository, avoid constipation   Leucocytosis  - Continue Cefotaxime, follow CBC   Gram neg Blood culture -repeat On abx -ID consult    Code Status: full Family Communication: patient Disposition Plan:    Consultants:  GI  Procedures:    Antibiotics:    HPI/Subjective: No complaints  Objective: Filed Vitals:   11/01/12 0536  BP: 134/68  Pulse: 99  Temp: 98.7 F (37.1 C)  Resp: 18    Intake/Output Summary (Last 24 hours) at 11/01/12 1004 Last data filed at 11/01/12 0536  Gross per 24 hour  Intake   1209 ml  Output   1300 ml  Net    -91 ml   Filed Weights    10/28/12 0347 10/30/12 0606 11/01/12 0500  Weight: 74.662 kg (164 lb 9.6 oz) 75.6 kg (166 lb 10.7 oz) 75.388 kg (166 lb 3.2 oz)    Exam:  General: jaundiced HEENT: icteric sclera, PERLA, EOMI  CVS: S1-S2 clear, no murmur rubs or gallops  Chest: clear to auscultation bilaterally, no wheezing, rales or rhonchi  Abdomen: soft nontender,distended, normal bowel sounds  Extremities: no cyanosis, clubbing or edema noted bilaterally  Neuro: Cranial nerves II-XII intact, no focal neurological deficits   Data Reviewed: Basic Metabolic Panel:  Recent Labs Lab 10/29/12 0515 10/30/12 0455 10/30/12 1413 10/30/12 1545 10/31/12 0640 11/01/12 0630  NA 128* 126*  --  126* 125* 128*  K 3.6 2.9*  --  2.9* 3.8 3.2*  CL 102 101  --  103 105 105  CO2 13* 10*  --  11* 8* 8*  GLUCOSE 116* 98  --  106* 110* 104*  BUN 11 11  --  11 12 11   CREATININE 0.68 0.63  --  0.56 0.66 0.63  CALCIUM 8.4 8.7  --  8.7 8.5 8.4  MG  --   --  2.8*  --   --   --    Liver Function Tests:  Recent Labs Lab 10/28/12 0735 10/29/12 0515 10/30/12 0455 10/31/12 0640 11/01/12 0630  AST 129* 129* 148* 148* 148*  ALT 43 50 59* 61* 64*  ALKPHOS 123* 136* 152* 163* 177*  BILITOT 30.6* 33.9* 34.8* 33.6* 31.8*  PROT 6.6 6.8 7.2 6.9 6.8  ALBUMIN 2.0* 2.0* 2.2* 2.0* 1.9*  Recent Labs Lab 10/27/12 2208  LIPASE 78*   No results found for this basename: AMMONIA,  in the last 168 hours CBC:  Recent Labs Lab 10/27/12 2208 10/28/12 0735 10/30/12 0455 10/31/12 0640 11/01/12 0630  WBC 17.8* 14.6* 18.3* 17.3* 19.6*  NEUTROABS 14.2* 12.1*  --   --   --   HGB 10.5* 9.5* 10.1* 9.7* 9.2*  HCT 28.6* 25.9* 28.3* 26.7* 25.2*  MCV 95.0 94.9 98.3 97.8 98.4  PLT 290 244 287 284 322   Cardiac Enzymes: No results found for this basename: CKTOTAL, CKMB, CKMBINDEX, TROPONINI,  in the last 168 hours BNP (last 3 results) No results found for this basename: PROBNP,  in the last 8760 hours CBG:  Recent Labs Lab  10/31/12 1645 10/31/12 2114 11/01/12 0040 11/01/12 0413 11/01/12 0804  GLUCAP 102* 104* 88 126* 98    Recent Results (from the past 240 hour(s))  CLOSTRIDIUM DIFFICILE BY PCR     Status: None   Collection Time    10/28/12  5:01 AM      Result Value Range Status   C difficile by pcr NEGATIVE  NEGATIVE Final  STOOL CULTURE     Status: None   Collection Time    10/28/12  5:01 AM      Result Value Range Status   Specimen Description STOOL   Final   Special Requests NONE   Final   Culture     Final   Value: NO SALMONELLA, SHIGELLA, CAMPYLOBACTER, YERSINIA, OR E.COLI 0157:H7 ISOLATED     Performed at [facility]   Report Status 10/31/2012 FINAL   Final  CULTURE, BLOOD (ROUTINE X 2)     Status: None   Collection Time    10/30/12  3:40 PM      Result Value Range Status   Specimen Description BLOOD BLOOD RIGHT FOREARM   Final   Special Requests BOTTLES DRAWN AEROBIC AND ANAEROBIC 10CC EACH   Final   Culture  Setup Time     Final   Value: 10/30/2012 22:08     Performed at [facility]   Culture     Final   Value: GRAM NEGATIVE RODS     Note: Gram Stain Report Called to,Read Back By and Verified With: MARGARET CASSEL 10/31/12 1105 BY SMITHERSJ     Performed at [facility]   Report Status PENDING   Incomplete     Studies: US Abdomen Complete  10/30/2012   *RADIOLOGY REPORT*  Clinical Data: Increasing liver enzymes.  ABDOMEN ULTRASOUND  Technique:  Complete abdominal ultrasound examination was performed including evaluation of the liver, gallbladder, bile ducts, pancreas, kidneys, spleen, IVC, and abdominal aorta.  Comparison: CT scan from 10/28/2012  Findings:  Gallbladder:  Gallbladder wall is thickened at 6 mm and shows intramural edema.  No evidence for pericholecystic fluid. Tumefactive sludge is noted within the lumen but no definite gallstones are evident.  The sonographer reports no sonographic Murphy's sign.  Common Bile Duct:  Nondilated at 3 mm  diameter.  Liver:  Echogenic liver suggests fatty infiltration.  Liver is noted to be enlarged on the recent CT scan.  No intrahepatic biliary duct dilatation.  IVC:  Normal.  Pancreas:  Not well seen secondary overlying midline bowel gas.  Spleen:  Normal.  Right kidney:  13.2 cm in long axis.  Normal.  Left kidney:  12.3 cm in long axis.  Normal.  Abdominal Aorta:  No aneurysm is evident.  No evidence for  ascites.  IMPRESSION: Gallbladder sludge with gallbladder wall thickening and intramural edema.  No gallstones are evident, but acute cholecystitis cannot be excluded by imaging.  In the setting of liver disease, gallbladder wall thickening can be nonacute, there is no evidence for associated ascites on the current study.  Nuclear scintigraphy may prove helpful to evaluate for acute cholecystitis as clinically warranted.   Original Report Authenticated By: Kennith Center, M.D.   Dg Abd 2 Views  10/30/2012   CLINICAL DATA:  Mid abdominal pain and pressure  EXAM: ABDOMEN - 2 VIEW  COMPARISON:  CT abdomen 10/28/2012  FINDINGS: The liver is enlarged similar to comparison CT. There are no dilated loops of large or small bowel. Moderate volume of stool in the ascending colon. Gas in the rectum. No pathologic calcifications.  IMPRESSION: 1. Hepatomegaly.  2. No evidence of bowel obstruction.   Electronically Signed   By: Genevive Bi M.D.   On: 10/30/2012 16:50    Scheduled Meds: . cefoTAXime (CLAFORAN) IV  1 g Intravenous Q8H  . docusate sodium  100 mg Oral BID  . feeding supplement  1 Container Oral TID BM  . folic acid  1 mg Oral Daily  . hydrocortisone  25 mg Rectal q morning - 10a  . multivitamin with minerals  1 tablet Oral Daily  . pantoprazole (PROTONIX) IV  40 mg Intravenous Q12H  . sodium chloride  3 mL Intravenous Q12H  . thiamine  100 mg Oral Daily   Or  . thiamine  100 mg Intravenous Daily   Continuous Infusions: . sodium chloride 75 mL/hr (10/30/12 1115)    Principal Problem:    Jaundice Active Problems:   Hyponatremia   Anemia   Leucocytosis    Time spent: 35    [facility], Adryana Mogensen  [facility] Pager [phone] If 7PM-7AM, please contact night-coverage at [email or web address], password [facility] 11/01/2012, 10:04 AM  LOS: 5 days

## 2012-11-01 NOTE — Progress Notes (Signed)
ANTIBIOTIC CONSULT NOTE - INITIAL  Pharmacy Consult for Cefepime Indication: GNR in blood  No Known Allergies  Patient Measurements: Height: 5\' 7"  (170.2 cm) Weight: 166 lb 3.2 oz (75.388 kg) IBW/kg (Calculated) : 66.1  Vital Signs: Temp: 98.6 F (37 C) (09/19 1357) Temp src: Oral (09/19 1357) BP: 104/51 mmHg (09/19 1357) Pulse Rate: 87 (09/19 1357) Intake/Output from previous day: 09/18 0701 - 09/19 0700 In: 1209 [P.O.:300; I.V.:9; IV Piggyback:100] Out: 1300 [Urine:1100; Stool:200] Intake/Output from this shift:    Labs:  Recent Labs  10/30/12 0455 10/30/12 1545 10/31/12 0640 11/01/12 0630  WBC 18.3*  --  17.3* 19.6*  HGB 10.1*  --  9.7* 9.2*  PLT 287  --  284 322  CREATININE 0.63 0.56 0.66 0.63   Estimated Creatinine Clearance: 120.5 ml/min (by C-G formula based on Cr of 0.63). No results found for this basename: VANCOTROUGH, Leodis Binet, VANCORANDOM, GENTTROUGH, GENTPEAK, GENTRANDOM, TOBRATROUGH, TOBRAPEAK, TOBRARND, AMIKACINPEAK, AMIKACINTROU, AMIKACIN,  in the last 72 hours   Microbiology: Recent Results (from the past 720 hour(s))  CLOSTRIDIUM DIFFICILE BY PCR     Status: None   Collection Time    10/28/12  5:01 AM      Result Value Range Status   C difficile by pcr NEGATIVE  NEGATIVE Final  STOOL CULTURE     Status: None   Collection Time    10/28/12  5:01 AM      Result Value Range Status   Specimen Description STOOL   Final   Special Requests NONE   Final   Culture     Final   Value: NO SALMONELLA, SHIGELLA, CAMPYLOBACTER, YERSINIA, OR E.COLI 0157:H7 ISOLATED     Performed at [facility]   Report Status 10/31/2012 FINAL   Final  CULTURE, BLOOD (ROUTINE X 2)     Status: None   Collection Time    10/30/12  3:40 PM      Result Value Range Status   Specimen Description BLOOD BLOOD RIGHT FOREARM   Final   Special Requests BOTTLES DRAWN AEROBIC AND ANAEROBIC 10CC EACH   Final   Culture  Setup Time     Final   Value: 10/30/2012 22:08   Performed at [facility]   Culture     Final   Value: GRAM NEGATIVE RODS     Note: Gram Stain Report Called to,Read Back By and Verified With: MARGARET CASSEL 10/31/12 1105 BY SMITHERSJ     Performed at [facility]   Report Status PENDING   Incomplete    Medical History: History reviewed. No pertinent past medical history.  Medications:  Scheduled:  . ceFEPime (MAXIPIME) IV  2 g Intravenous Q8H  . docusate sodium  100 mg Oral BID  . feeding supplement  1 Container Oral TID BM  . folic acid  1 mg Oral Daily  . hydrocortisone  25 mg Rectal q morning - 10a  . multivitamin with minerals  1 tablet Oral Daily  . pantoprazole (PROTONIX) IV  40 mg Intravenous Q12H  . sodium chloride  3 mL Intravenous Q12H  . thiamine  100 mg Oral Daily   Or  . thiamine  100 mg Intravenous Daily   Assessment: 37 yo M presents w/ jaundice x 1wk. Pt has liver/biliary disease 2/2 chronic alcohol use. Pt has leukocytosis, afebrile, and stable renal function.Was started on cefotaxime initially, however patient is growing GNR in blood, and would benefit from broader coverage.    Plan:  - Started  Cefepime 2g IV q8h - F/u on BCx, and clinical progression  Dolphus Jenny 11/01/2012,6:04 PM

## 2012-11-02 LAB — COMPREHENSIVE METABOLIC PANEL
ALT: 64 U/L — ABNORMAL HIGH (ref 0–53)
Alkaline Phosphatase: 176 U/L — ABNORMAL HIGH (ref 39–117)
Chloride: 108 mEq/L (ref 96–112)
GFR calc Af Amer: >90 mL/min (ref >90)
Glucose, Bld: 93 mg/dL (ref 70–99)
Potassium: 2.9 mEq/L — ABNORMAL LOW (ref 3.5–5.1)
Sodium: 128 mEq/L — ABNORMAL LOW (ref 135–145)
Total Bilirubin: 29.1 mg/dL (ref 0.3–1.2)
Total Protein: 6.4 g/dL (ref 6.0–8.3)

## 2012-11-02 LAB — BASIC METABOLIC PANEL
BUN: 15 mg/dL (ref 6–23)
CO2: 9 mEq/L — CL (ref 19–32)
Calcium: 7.8 mg/dL — ABNORMAL LOW (ref 8.4–10.5)
GFR calc Af Amer: >90 mL/min (ref >90)
GFR calc non Af Amer: >90 mL/min (ref >90)
Potassium: 2.9 mEq/L — ABNORMAL LOW (ref 3.5–5.1)
Sodium: 130 mEq/L — ABNORMAL LOW (ref 135–145)

## 2012-11-02 LAB — CULTURE, BLOOD (ROUTINE X 2)

## 2012-11-02 LAB — CBC
HCT: 24.3 % — ABNORMAL LOW (ref 39.0–52.0)
MCHC: 36.2 g/dL — ABNORMAL HIGH (ref 30.0–36.0)
MCV: 98.4 fL (ref 78.0–100.0)
RDW: 24.2 % — ABNORMAL HIGH (ref 11.5–15.5)

## 2012-11-02 LAB — GLUCOSE, CAPILLARY
Glucose-Capillary: 104 mg/dL — ABNORMAL HIGH (ref 70–99)
Glucose-Capillary: 122 mg/dL — ABNORMAL HIGH (ref 70–99)
Glucose-Capillary: 90 mg/dL (ref 70–99)

## 2012-11-02 LAB — LACTIC ACID, PLASMA: Lactic Acid, Venous: 1.2 mmol/L (ref 0.5–2.2)

## 2012-11-02 MED ORDER — POTASSIUM CHLORIDE 10 MEQ/100ML IV SOLN
10.0000 meq | INTRAVENOUS | Status: AC
  Administered 2012-11-02 (×5): 10 meq via INTRAVENOUS
  Filled 2012-11-02 (×5): qty 100

## 2012-11-02 MED ORDER — STERILE WATER FOR INJECTION IV SOLN
INTRAVENOUS | Status: DC
  Administered 2012-11-02 – 2012-11-05 (×7): via INTRAVENOUS
  Filled 2012-11-02 (×15): qty 850

## 2012-11-02 MED ORDER — POTASSIUM CHLORIDE 10 MEQ/100ML IV SOLN: 10.0000 meq | INTRAVENOUS | Status: DC

## 2012-11-02 MED ORDER — SODIUM CHLORIDE 0.9 % IV SOLN
500.0000 mg | Freq: Four times a day (QID) | INTRAVENOUS | Status: DC
  Administered 2012-11-02 – 2012-11-08 (×22): 500 mg via INTRAVENOUS
  Filled 2012-11-02 (×27): qty 500

## 2012-11-02 MED ORDER — POTASSIUM CHLORIDE CRYS ER 20 MEQ PO TBCR: 40.0000 meq | EXTENDED_RELEASE_TABLET | ORAL | Status: DC

## 2012-11-02 NOTE — Progress Notes (Signed)
[facility] PROGRESS NOTE  [name] ZOX:[id number] DOB: February 27, 1975 DOA: 10/27/2012 PCP: No PCP Per Patient  Assessment/Plan: Non anion gap acidosis- ?RTA vs liver failure- place on sterile water with bicarb  Jaundice: Acute viral/alcoholic hepatitis on alcoholic liver disease  - Workup in progress, Hepatitis panel neg. HIV-negative  - CT abdomen shows no evidence for cirrhosis although he has the portal hypertension, does not have any biliary obstruction on CT abdomen.  - GI was consulted, recommended continuing current workup and complete cessation of alcohol. Patient was explained through the interpreter line to stop alcohol drinking.  - Alkaline phosphatase is somewhat elevated with direct hyperbilirubinemia, no biliary obstruction. Ordered GGT, 5'NT. Rule out other causes, ordered anemia panel to assess ferritin level (elevated at 414).  -would add 40 mg of prednisone but blood culture was positive for gram neg rod- ID consulted and repeat culture ordered  Hyponatremia: ? dehydration and alcoholic liver disease; IVF - Continue to monitor, patient alert and oriented   Hypokalemia-  -replete  History of alcohol abuse:  - he reports drinking 6-12 pack of beer every day, hard liquor for the last 2 months.  - Continue CIWA protocol  - Counseled strongly for alcohol cessation   Anemia: 2 episodes of rectal bleeding, patient also reported constipation and straining  - Continue Anusol suppository, avoid constipation   Leucocytosis  - Continue Cefotaxime, follow CBC   Gram neg Blood culture- CITROBACTER FREUNDII -repeat On abx -ID consult    Code Status: full Family Communication: patient via interpreter Disposition Plan:    Consultants:  GI  Procedures:    Antibiotics:  primaxin 9/20  HPI/Subjective: Overall feeling better  Objective: Filed Vitals:   11/02/12 0538  BP: 116/62  Pulse: 91  Temp: 98.7 F (37.1 C)  Resp: 19   No intake or output  data in the 24 hours ending 11/02/12 0937 Filed Weights   10/30/12 0606 11/01/12 0500 11/02/12 0500  Weight: 75.6 kg (166 lb 10.7 oz) 75.388 kg (166 lb 3.2 oz) 70.024 kg (154 lb 6 oz)    Exam:  General: jaundiced HEENT: icteric sclera, PERLA, EOMI  CVS: S1-S2 clear, no murmur rubs or gallops  Chest: clear to auscultation bilaterally, no wheezing, rales or rhonchi  Abdomen: soft nontender,distended, normal bowel sounds  Extremities: no cyanosis, clubbing or edema noted bilaterally  Neuro: Cranial nerves II-XII intact, no focal neurological deficits   Data Reviewed: Basic Metabolic Panel:  Recent Labs Lab 10/30/12 0455 10/30/12 1413 10/30/12 1545 10/31/12 0640 11/01/12 0630 11/02/12 0500  NA 126*  --  126* 125* 128* 128*  K 2.9*  --  2.9* 3.8 3.2* 2.9*  CL 101  --  103 105 105 108  CO2 10*  --  11* 8* 8* <7*  GLUCOSE 98  --  106* 110* 104* 93  BUN 11  --  11 12 11 13   CREATININE 0.63  --  0.56 0.66 0.63 0.67  CALCIUM 8.7  --  8.7 8.5 8.4 8.2*  MG  --  2.8*  --   --   --   --    Liver Function Tests:  Recent Labs Lab 10/29/12 0515 10/30/12 0455 10/31/12 0640 11/01/12 0630 11/02/12 0500  AST 129* 148* 148* 148* 135*  ALT 50 59* 61* 64* 64*  ALKPHOS 136* 152* 163* 177* 176*  BILITOT 33.9* 34.8* 33.6* 31.8* 29.1*  PROT 6.8 7.2 6.9 6.8 6.4  ALBUMIN 2.0* 2.2* 2.0* 1.9* 1.8*    Recent Labs Lab 10/27/12  2208  LIPASE 78*   No results found for this basename: AMMONIA,  in the last 168 hours CBC:  Recent Labs Lab 10/27/12 2208 10/28/12 0735 10/30/12 0455 10/31/12 0640 11/01/12 0630 11/02/12 0500  WBC 17.8* 14.6* 18.3* 17.3* 19.6* 17.3*  NEUTROABS 14.2* 12.1*  --   --   --   --   HGB 10.5* 9.5* 10.1* 9.7* 9.2* 8.8*  HCT 28.6* 25.9* 28.3* 26.7* 25.2* 24.3*  MCV 95.0 94.9 98.3 97.8 98.4 98.4  PLT 290 244 287 284 322 200   Cardiac Enzymes: No results found for this basename: CKTOTAL, CKMB, CKMBINDEX, TROPONINI,  in the last 168 hours BNP (last 3  results) No results found for this basename: PROBNP,  in the last 8760 hours CBG:  Recent Labs Lab 11/01/12 1137 11/01/12 1618 11/01/12 2016 11/02/12 0013 11/02/12 0522  GLUCAP 120* 94 114* 90 110*    Recent Results (from the past 240 hour(s))  CLOSTRIDIUM DIFFICILE BY PCR     Status: None   Collection Time    10/28/12  5:01 AM      Result Value Range Status   C difficile by pcr NEGATIVE  NEGATIVE Final  STOOL CULTURE     Status: None   Collection Time    10/28/12  5:01 AM      Result Value Range Status   Specimen Description STOOL   Final   Special Requests NONE   Final   Culture     Final   Value: NO SALMONELLA, SHIGELLA, CAMPYLOBACTER, YERSINIA, OR E.COLI 0157:H7 ISOLATED     Performed at [facility]   Report Status 10/31/2012 FINAL   Final  CULTURE, BLOOD (ROUTINE X 2)     Status: None   Collection Time    10/30/12  3:40 PM      Result Value Range Status   Specimen Description BLOOD BLOOD RIGHT FOREARM   Final   Special Requests BOTTLES DRAWN AEROBIC AND ANAEROBIC 10CC EACH   Final   Culture  Setup Time     Final   Value: 10/30/2012 22:08     Performed at [facility]   Culture     Final   Value: CITROBACTER FREUNDII     Note: Gram Stain Report Called to,Read Back By and Verified With: MARGARET CASSEL 10/31/12 1105 BY SMITHERSJ     Performed at [facility]   Report Status 11/02/2012 FINAL   Final   Organism ID, Bacteria CITROBACTER FREUNDII   Final     Studies: No results found.  Scheduled Meds: . docusate sodium  100 mg Oral BID  . feeding supplement  1 Container Oral TID BM  . folic acid  1 mg Oral Daily  . hydrocortisone  25 mg Rectal q morning - 10a  . imipenem-cilastatin  500 mg Intravenous Q6H  . multivitamin with minerals  1 tablet Oral Daily  . pantoprazole (PROTONIX) IV  40 mg Intravenous Q12H  . potassium chloride  10 mEq Intravenous Q1 Hr x 5  . sodium chloride  3 mL Intravenous Q12H  . thiamine  100 mg Oral Daily    Or  . thiamine  100 mg Intravenous Daily   Continuous Infusions: .  sodium bicarbonate 150 mEq in sterile water 1000 mL infusion      Principal Problem:   Jaundice Active Problems:   Hyponatremia   Anemia   Leucocytosis    Time spent: 35    VANN, JESSICA  [facility] Pager  [phone] If 7PM-7AM, please contact night-coverage at [email or web address], password [facility] 11/02/2012, 9:37 AM  LOS: 6 days

## 2012-11-02 NOTE — Progress Notes (Signed)
CRITICAL VALUE ALERT  Critical value received:  CO2: less than 7; Total bili 29.1  Date of notification:  11/02/2012  Time of notification:  0824  Critical value read back: Yes  Nurse who received alert:  Franne Forts  MD notified (1st page):  Dr. Benjamine Mola (text page via [facility])  Time of first page:  0902  MD notified (2nd page):  Time of second page:  Responding MD:  Milford Cage  Time MD responded:  [phone] (Informed MD while at bedside)

## 2012-11-02 NOTE — Progress Notes (Signed)
[facility] notifed that the patient had received 5 runs of K and didn't have a follow up b-met order to evaluate labs and that 5 runs and 40 MEq was ordered q4hrs. Anticipate new orders to be written and will follow up pharmacy was also notified. Ilean Skill LPN

## 2012-11-02 NOTE — Progress Notes (Signed)
Communicated with patient regarding plan of care using [employer]: July [id number]. Pt denies any questions or concerns at this point and was informed to let any of the nurses know when he needs an interpreter.

## 2012-11-02 NOTE — Progress Notes (Signed)
ANTIBIOTIC CONSULT NOTE - FOLLOW UP  Pharmacy Consult for Imipenem Indication: GNR bacteremia  No Known Allergies  Patient Measurements: Height: 5\' 7"  (170.2 cm) Weight: 154 lb 6 oz (70.024 kg) IBW/kg (Calculated) : 66.1  Vital Signs: Temp: 98.7 F (37.1 C) (09/20 0538) Temp src: Oral (09/20 0538) BP: 116/62 mmHg (09/20 0538) Pulse Rate: 91 (09/20 0538) Intake/Output from previous day:   Intake/Output from this shift:    Labs:  Recent Labs  10/31/12 0640 11/01/12 0630 11/02/12 0500  WBC 17.3* 19.6* 17.3*  HGB 9.7* 9.2* 8.8*  PLT 284 322 200  CREATININE 0.66 0.63 0.67   Estimated Creatinine Clearance: 120.5 ml/min (by C-G formula based on Cr of 0.67). No results found for this basename: VANCOTROUGH, Leodis Binet, VANCORANDOM, GENTTROUGH, GENTPEAK, GENTRANDOM, TOBRATROUGH, TOBRAPEAK, TOBRARND, AMIKACINPEAK, AMIKACINTROU, AMIKACIN,  in the last 72 hours   Microbiology: Recent Results (from the past 720 hour(s))  CLOSTRIDIUM DIFFICILE BY PCR     Status: None   Collection Time    10/28/12  5:01 AM      Result Value Range Status   C difficile by pcr NEGATIVE  NEGATIVE Final  STOOL CULTURE     Status: None   Collection Time    10/28/12  5:01 AM      Result Value Range Status   Specimen Description STOOL   Final   Special Requests NONE   Final   Culture     Final   Value: NO SALMONELLA, SHIGELLA, CAMPYLOBACTER, YERSINIA, OR E.COLI 0157:H7 ISOLATED     Performed at [facility]   Report Status 10/31/2012 FINAL   Final  CULTURE, BLOOD (ROUTINE X 2)     Status: None   Collection Time    10/30/12  3:40 PM      Result Value Range Status   Specimen Description BLOOD BLOOD RIGHT FOREARM   Final   Special Requests BOTTLES DRAWN AEROBIC AND ANAEROBIC 10CC EACH   Final   Culture  Setup Time     Final   Value: 10/30/2012 22:08     Performed at [facility]   Culture     Final   Value: CITROBACTER FREUNDII     Note: Gram Stain Report Called to,Read Back By  and Verified With: MARGARET CASSEL 10/31/12 1105 BY SMITHERSJ     Performed at [facility]   Report Status 11/02/2012 FINAL   Final   Organism ID, Bacteria CITROBACTER FREUNDII   Final    Anti-infectives   Start     Dose/Rate Route Frequency Ordered Stop   11/02/12 1200  imipenem-cilastatin (PRIMAXIN) 500 mg in sodium chloride 0.9 % 100 mL IVPB     500 mg 200 mL/hr over 30 Minutes Intravenous 4 times per day 11/02/12 0936     11/01/12 1430  ceFEPIme (MAXIPIME) 2 g in dextrose 5 % 50 mL IVPB  Status:  Discontinued     2 g 100 mL/hr over 30 Minutes Intravenous 3 times per day 11/01/12 1422 11/02/12 0857   10/28/12 0600  cefoTAXime (CLAFORAN) 1 g in dextrose 5 % 50 mL IVPB  Status:  Discontinued     1 g 100 mL/hr over 30 Minutes Intravenous 3 times per day 10/28/12 0454 11/01/12 1422      Assessment: 37 yo M presents w/ jaundice x 1wk. Pt has liver/biliary disease 2/2 chronic alcohol use. Pt has leukocytosis, afebrile, and stable renal function. Pt BCx returned as CITROBACTER FREUNDII (Resistant to rocephin, ancef, cefoxitin, ceftaz, zosyn).  Due to resistance, per ID will start Imipenem-cilastatin  Plan:  - Start Imipenem-Cilastatin 500mg  q6hr - Monitor renal function, clinical progression, and f/u repeat BCx  Allena Katz, Oleg Oleson M 11/02/2012,9:36 AM

## 2012-11-03 LAB — COMPREHENSIVE METABOLIC PANEL
ALT: 63 U/L — ABNORMAL HIGH (ref 0–53)
Albumin: 1.7 g/dL — ABNORMAL LOW (ref 3.5–5.2)
Alkaline Phosphatase: 163 U/L — ABNORMAL HIGH (ref 39–117)
CO2: 10 mEq/L — CL (ref 19–32)
Calcium: 7.7 mg/dL — ABNORMAL LOW (ref 8.4–10.5)
Creatinine, Ser: 0.73 mg/dL (ref 0.50–1.35)
GFR calc Af Amer: >90 mL/min (ref >90)
Glucose, Bld: 94 mg/dL (ref 70–99)
Potassium: 2.5 mEq/L — CL (ref 3.5–5.1)
Sodium: 126 mEq/L — ABNORMAL LOW (ref 135–145)
Total Protein: 6 g/dL (ref 6.0–8.3)

## 2012-11-03 LAB — BASIC METABOLIC PANEL
BUN: 15 mg/dL (ref 6–23)
CO2: 15 mEq/L — ABNORMAL LOW (ref 19–32)
Calcium: 7.6 mg/dL — ABNORMAL LOW (ref 8.4–10.5)
Chloride: 104 mEq/L (ref 96–112)
Glucose, Bld: 107 mg/dL — ABNORMAL HIGH (ref 70–99)
Sodium: 128 mEq/L — ABNORMAL LOW (ref 135–145)

## 2012-11-03 LAB — GLUCOSE, CAPILLARY
Glucose-Capillary: 116 mg/dL — ABNORMAL HIGH (ref 70–99)
Glucose-Capillary: 61 mg/dL — ABNORMAL LOW (ref 70–99)
Glucose-Capillary: 92 mg/dL (ref 70–99)
Glucose-Capillary: 98 mg/dL (ref 70–99)

## 2012-11-03 MED ORDER — POTASSIUM CHLORIDE CRYS ER 20 MEQ PO TBCR
40.0000 meq | EXTENDED_RELEASE_TABLET | ORAL | Status: AC
  Administered 2012-11-03 – 2012-11-04 (×5): 40 meq via ORAL
  Filled 2012-11-03 (×5): qty 2

## 2012-11-03 MED ORDER — POTASSIUM CHLORIDE 10 MEQ/100ML IV SOLN
10.0000 meq | INTRAVENOUS | Status: AC
  Administered 2012-11-03 (×5): 10 meq via INTRAVENOUS
  Filled 2012-11-03 (×5): qty 100

## 2012-11-03 MED ORDER — POTASSIUM CHLORIDE 10 MEQ/100ML IV SOLN
10.0000 meq | INTRAVENOUS | Status: AC
  Administered 2012-11-03 (×4): 10 meq via INTRAVENOUS
  Filled 2012-11-03 (×4): qty 100

## 2012-11-03 NOTE — Progress Notes (Signed)
[facility] PROGRESS NOTE  Danny Arnold ZOX:[id number] DOB: 1975-02-22 DOA: 10/27/2012 PCP: No PCP Per Patient  Assessment/Plan: Non anion gap acidosis- ?RTA vs liver failure- place on sterile water with bicarb -urine K+ and Na  Jaundice: Acute viral/alcoholic hepatitis on alcoholic liver disease  - Workup in progress, Hepatitis panel neg. HIV-negative  - CT abdomen shows no evidence for cirrhosis although he has the portal hypertension, does not have any biliary obstruction on CT abdomen.  - GI was consulted, recommended continuing current workup and complete cessation of alcohol. Patient was explained through the interpreter line to stop alcohol drinking.  - Alkaline phosphatase is somewhat elevated with direct hyperbilirubinemia, no biliary obstruction. Ordered GGT, 5'NT. Rule out other causes, ordered anemia panel to assess ferritin level (elevated at 414).  -would add 40 mg of prednisone but blood culture was positive for gram neg rod- ID consulted and repeat culture ordered  Hyponatremia: ? dehydration and alcoholic liver disease; IVF - Continue to monitor, patient alert and oriented   Hypokalemia-  -replete  History of alcohol abuse:  - he reports drinking 6-12 pack of beer every day, hard liquor for the last 2 months.  - Continue CIWA protocol  - Counseled strongly for alcohol cessation   Anemia: 2 episodes of rectal bleeding, patient also reported constipation and straining  - Continue Anusol suppository, avoid constipation   Leucocytosis  - Continue Cefotaxime, follow CBC   Gram neg Blood culture- CITROBACTER FREUNDII -repeat On abx NGTD -ID consult    Code Status: full Family Communication: patient via interpreter Disposition Plan:    Consultants:  GI  Procedures:    Antibiotics:  primaxin 9/20  HPI/Subjective: Overall feeling better  Objective: Filed Vitals:   11/03/12 0639  BP: 100/58  Pulse: 93  Temp: 98.6 F (37 C)  Resp: 20     Intake/Output Summary (Last 24 hours) at 11/03/12 1117 Last data filed at 11/03/12 0800  Gross per 24 hour  Intake    240 ml  Output      0 ml  Net    240 ml   Filed Weights   10/30/12 0606 11/01/12 0500 11/02/12 0500  Weight: 75.6 kg (166 lb 10.7 oz) 75.388 kg (166 lb 3.2 oz) 70.024 kg (154 lb 6 oz)    Exam:  General: jaundiced HEENT: icteric sclera, PERLA, EOMI  CVS: S1-S2 clear, no murmur rubs or gallops  Chest: clear to auscultation bilaterally, no wheezing, rales or rhonchi  Abdomen: soft nontender,distended, normal bowel sounds  Extremities: no cyanosis, clubbing or edema noted bilaterally  Neuro: Cranial nerves II-XII intact, no focal neurological deficits   Data Reviewed: Basic Metabolic Panel:  Recent Labs Lab 10/30/12 0455 10/30/12 1413  10/31/12 0640 11/01/12 0630 11/02/12 0500 11/02/12 1545 11/03/12 0206  NA 126*  --   < > 125* 128* 128* 130* 126*  K 2.9*  --   < > 3.8 3.2* 2.9* 2.9* 2.5*  CL 101  --   < > 105 105 108 108 103  CO2 10*  --   < > 8* 8* <7* 9* 10*  GLUCOSE 98  --   < > 110* 104* 93 99 94  BUN 11  --   < > 12 11 13 15 15   CREATININE 0.63  --   < > 0.66 0.63 0.67 0.69 0.73  CALCIUM 8.7  --   < > 8.5 8.4 8.2* 7.8* 7.7*  MG  --  2.8*  --   --   --   --   --   --   < > =  values in this interval not displayed. Liver Function Tests:  Recent Labs Lab 10/30/12 0455 10/31/12 0640 11/01/12 0630 11/02/12 0500 11/03/12 0206  AST 148* 148* 148* 135* 131*  ALT 59* 61* 64* 64* 63*  ALKPHOS 152* 163* 177* 176* 163*  BILITOT 34.8* 33.6* 31.8* 29.1* 26.7*  PROT 7.2 6.9 6.8 6.4 6.0  ALBUMIN 2.2* 2.0* 1.9* 1.8* 1.7*    Recent Labs Lab 10/27/12 2208  LIPASE 78*   No results found for this basename: AMMONIA,  in the last 168 hours CBC:  Recent Labs Lab 10/27/12 2208 10/28/12 0735 10/30/12 0455 10/31/12 0640 11/01/12 0630 11/02/12 0500  WBC 17.8* 14.6* 18.3* 17.3* 19.6* 17.3*  NEUTROABS 14.2* 12.1*  --   --   --   --   HGB 10.5*  9.5* 10.1* 9.7* 9.2* 8.8*  HCT 28.6* 25.9* 28.3* 26.7* 25.2* 24.3*  MCV 95.0 94.9 98.3 97.8 98.4 98.4  PLT 290 244 287 284 322 200   Cardiac Enzymes: No results found for this basename: CKTOTAL, CKMB, CKMBINDEX, TROPONINI,  in the last 168 hours BNP (last 3 results) No results found for this basename: PROBNP,  in the last 8760 hours CBG:  Recent Labs Lab 11/02/12 1628 11/02/12 2018 11/03/12 0032 11/03/12 0449 11/03/12 0507  GLUCAP 101* 104* 92 61* 116*    Recent Results (from the past 240 hour(s))  CLOSTRIDIUM DIFFICILE BY PCR     Status: None   Collection Time    10/28/12  5:01 AM      Result Value Range Status   C difficile by pcr NEGATIVE  NEGATIVE Final  STOOL CULTURE     Status: None   Collection Time    10/28/12  5:01 AM      Result Value Range Status   Specimen Description STOOL   Final   Special Requests NONE   Final   Culture     Final   Value: NO SALMONELLA, SHIGELLA, CAMPYLOBACTER, YERSINIA, OR E.COLI 0157:H7 ISOLATED     Performed at [facility]   Report Status 10/31/2012 FINAL   Final  CULTURE, BLOOD (ROUTINE X 2)     Status: None   Collection Time    10/30/12  3:40 PM      Result Value Range Status   Specimen Description BLOOD BLOOD RIGHT FOREARM   Final   Special Requests BOTTLES DRAWN AEROBIC AND ANAEROBIC 10CC EACH   Final   Culture  Setup Time     Final   Value: 10/30/2012 22:08     Performed at [facility]   Culture     Final   Value: CITROBACTER FREUNDII     Note: Gram Stain Report Called to,Read Back By and Verified With: MARGARET CASSEL 10/31/12 1105 BY SMITHERSJ     Performed at [facility]   Report Status 11/02/2012 FINAL   Final   Organism ID, Bacteria CITROBACTER FREUNDII   Final  CULTURE, BLOOD (ROUTINE X 2)     Status: None   Collection Time    10/31/12 11:55 AM      Result Value Range Status   Specimen Description BLOOD RIGHT ARM   Final   Special Requests BOTTLES DRAWN AEROBIC AND ANAEROBIC 10CC   Final    Culture  Setup Time     Final   Value: 11/01/2012 07:19     Performed at [facility]   Culture     Final   Value:  BLOOD CULTURE RECEIVED NO GROWTH TO DATE CULTURE WILL BE HELD FOR 5 DAYS BEFORE ISSUING A FINAL NEGATIVE REPORT     Performed at [facility]   Report Status PENDING   Incomplete  CULTURE, BLOOD (ROUTINE X 2)     Status: None   Collection Time    11/01/12 12:55 AM      Result Value Range Status   Specimen Description BLOOD LEFT ARM   Final   Special Requests BOTTLES DRAWN AEROBIC ONLY 10CC   Final   Culture  Setup Time     Final   Value: 11/01/2012 09:47     Performed at [facility]   Culture     Final   Value:        BLOOD CULTURE RECEIVED NO GROWTH TO DATE CULTURE WILL BE HELD FOR 5 DAYS BEFORE ISSUING A FINAL NEGATIVE REPORT     Performed at [facility]   Report Status PENDING   Incomplete     Studies: No results found.  Scheduled Meds: . docusate sodium  100 mg Oral BID  . feeding supplement  1 Container Oral TID BM  . folic acid  1 mg Oral Daily  . hydrocortisone  25 mg Rectal q morning - 10a  . imipenem-cilastatin  500 mg Intravenous Q6H  . multivitamin with minerals  1 tablet Oral Daily  . pantoprazole (PROTONIX) IV  40 mg Intravenous Q12H  . potassium chloride  10 mEq Intravenous Q1 Hr x 5  . potassium chloride  40 mEq Oral Q4H  . sodium chloride  3 mL Intravenous Q12H  . thiamine  100 mg Oral Daily   Or  . thiamine  100 mg Intravenous Daily   Continuous Infusions: .  sodium bicarbonate 150 mEq in sterile water 1000 mL infusion 100 mL/hr at 11/02/12 2122    Principal Problem:   Jaundice Active Problems:   Hyponatremia   Anemia   Leucocytosis    Time spent: 35    [facility], JESSICA  [facility] Pager [phone] If 7PM-7AM, please contact night-coverage at [email or web address], password [facility] 11/03/2012, 11:17 AM  LOS: 7 days

## 2012-11-03 NOTE — Progress Notes (Signed)
Hypoglycemic Event  CBG: 61  Treatment: Boost drink   Symptoms: none  Follow-up CBG: Time:0500 CBG Result:116  Possible Reasons for Event: refused boost supplement  Comments/MD notified: no    Caprice Kluver  Remember to initiate Hypoglycemia Order Set & complete

## 2012-11-03 NOTE — Progress Notes (Signed)
    [facility]    PATIENT GREW CITROBACTER FROM BLOOD R TO MOST CEPH  I PLACED HIM ON IMIPENEM   (I DID NOT TRUST THE CEFEPIME SENSITIVITIES)  I AM REPEATING BLOOD CULTURES TODAY AS WE NEVER REPEATED BLOOD CULTURES WHEN HE WAS ON ACTIVE ANTIBIOTICS  I WOULD CONTINUE IMIPENEM WITH EXCELLENT COVERAGE FOR HIS CITROBACTER WHILE HE IS IN HOUSE AND THEN CHANGE HIM TO CIPROFLOXACIN IF HE IS STABLE FOR DC TO [facility] TWO WEEKS FROM TODAY  I will sign off   Please call with further questions.

## 2012-11-04 LAB — COMPREHENSIVE METABOLIC PANEL
AST: 145 U/L — ABNORMAL HIGH (ref 0–37)
Albumin: 1.8 g/dL — ABNORMAL LOW (ref 3.5–5.2)
Alkaline Phosphatase: 178 U/L — ABNORMAL HIGH (ref 39–117)
CO2: 14 mEq/L — ABNORMAL LOW (ref 19–32)
Calcium: 7.8 mg/dL — ABNORMAL LOW (ref 8.4–10.5)
Creatinine, Ser: 0.65 mg/dL (ref 0.50–1.35)
GFR calc Af Amer: >90 mL/min (ref >90)
GFR calc non Af Amer: >90 mL/min (ref >90)
Glucose, Bld: 97 mg/dL (ref 70–99)
Potassium: 3.5 mEq/L (ref 3.5–5.1)
Total Protein: 6.2 g/dL (ref 6.0–8.3)

## 2012-11-04 LAB — GLUCOSE, CAPILLARY: Glucose-Capillary: 107 mg/dL — ABNORMAL HIGH (ref 70–99)

## 2012-11-04 MED ORDER — POTASSIUM CHLORIDE CRYS ER 20 MEQ PO TBCR
40.0000 meq | EXTENDED_RELEASE_TABLET | Freq: Every day | ORAL | Status: DC
  Administered 2012-11-04: 40 meq via ORAL
  Filled 2012-11-04 (×2): qty 2

## 2012-11-04 MED ORDER — PANTOPRAZOLE SODIUM 40 MG PO TBEC
40.0000 mg | DELAYED_RELEASE_TABLET | Freq: Every day | ORAL | Status: DC
  Administered 2012-11-05 – 2012-11-08 (×4): 40 mg via ORAL
  Filled 2012-11-04 (×4): qty 1

## 2012-11-04 NOTE — Progress Notes (Addendum)
[facility] PROGRESS NOTE  [name] ZOX:[id number] DOB: Nov 01, 1975 DOA: 10/27/2012 PCP: No PCP Per Patient  Danny Arnold is a 37 y.o. male with no significant past medical history who drinks alcohol every day except for the last 4 days presented to the ER as patient noticed himself becoming yellowish. Patient also has been having constipation and nausea. He has abdominal pain when he moves his bowels. In the ER patient was found to be jaundiced with blood work showing bilirubin around 40 with CT abdomen and pelvis done with contrast showing signs of portal hypertension and hepatomegaly. Patient states he drinks alcohol everyday except for last 4 days and denies having used any IV drugs  Assessment/Plan: Non anion gap acidosis- ?RTA vs liver failure- place on sterile water with bicarb- numbers improving  Gram neg Blood culture- CITROBACTER FREUNDII -repeat BC NGTD -per ID on 9/21 I WOULD CONTINUE IMIPENEM WITH EXCELLENT COVERAGE FOR HIS CITROBACTER WHILE HE IS IN HOUSE AND THEN CHANGE HIM TO CIPROFLOXACIN IF HE IS STABLE FOR DC TO [facility] TWO WEEKS FROM TODAY  Jaundice: Acute viral/alcoholic hepatitis on alcoholic liver disease   Hepatitis panel neg. HIV-negative  - CT abdomen shows no evidence for cirrhosis although he has the portal hypertension, does not have any biliary obstruction on CT abdomen.  - GI was consulted, recommended continuing current workup and complete cessation of alcohol. Patient was explained through the interpreter line to stop alcohol drinking.  - Alkaline phosphatase is somewhat elevated with direct hyperbilirubinemia, no biliary obstruction. anemia panel to assess ferritin level (mildly elevated at 414).  -would add 40 mg of prednisone but blood culture was positive for gram neg rod- ID consulted and repeat culture ordered  Hyponatremia: ? dehydration and alcoholic liver disease; IVF - Continue to monitor, patient alert and oriented   Hypokalemia-   -replete  History of alcohol abuse:  - he reports drinking 6-12 pack of beer every day, hard liquor for the last 2 months.  - Continue CIWA protocol  - Counseled strongly for alcohol cessation   Anemia: 2 episodes of rectal bleeding, patient also reported constipation and straining  - Continue Anusol suppository, avoid constipation   Leucocytosis  - Continue Cefotaxime, follow CBC       Code Status: full Family Communication: patient via interpreter Disposition Plan:    Consultants:  GI  Procedures:    Antibiotics:  primaxin 9/20  HPI/Subjective: Overall feeling better No chills, no SOB Good UOP  Objective: Filed Vitals:   11/04/12 0634  BP: 113/61  Pulse: 49  Temp: 97.8 F (36.6 C)  Resp: 19    Intake/Output Summary (Last 24 hours) at 11/04/12 1031 Last data filed at 11/04/12 0537  Gross per 24 hour  Intake   3100 ml  Output      0 ml  Net   3100 ml   Filed Weights   10/30/12 0606 11/01/12 0500 11/02/12 0500  Weight: 75.6 kg (166 lb 10.7 oz) 75.388 kg (166 lb 3.2 oz) 70.024 kg (154 lb 6 oz)    Exam:  General: jaundiced HEENT: icteric sclera, PERLA, EOMI  CVS: S1-S2 clear, no murmur rubs or gallops  Chest: clear to auscultation bilaterally, no wheezing, rales or rhonchi  Abdomen: soft nontender,distended, normal bowel sounds  Extremities: no cyanosis, clubbing or edema noted bilaterally  Neuro: Cranial nerves II-XII intact, no focal neurological deficits   Data Reviewed: Basic Metabolic Panel:  Recent Labs Lab 10/30/12 0455 10/30/12 1413  11/02/12 0500 11/02/12 1545 11/03/12 0206  11/03/12 1650 11/04/12 0625  NA 126*  --   < > 128* 130* 126* 128* 129*  K 2.9*  --   < > 2.9* 2.9* 2.5* 3.5 3.5  CL 101  --   < > 108 108 103 104 105  CO2 10*  --   < > <7* 9* 10* 15* 14*  GLUCOSE 98  --   < > 93 99 94 107* 97  BUN 11  --   < > 13 15 15 15 13   CREATININE 0.63  --   < > 0.67 0.69 0.73 0.69 0.65  CALCIUM 8.7  --   < > 8.2* 7.8* 7.7*  7.6* 7.8*  MG  --  2.8*  --   --   --   --   --   --   < > = values in this interval not displayed. Liver Function Tests:  Recent Labs Lab 10/31/12 0640 11/01/12 0630 11/02/12 0500 11/03/12 0206 11/04/12 0625  AST 148* 148* 135* 131* 145*  ALT 61* 64* 64* 63* 65*  ALKPHOS 163* 177* 176* 163* 178*  BILITOT 33.6* 31.8* 29.1* 26.7* 26.8*  PROT 6.9 6.8 6.4 6.0 6.2  ALBUMIN 2.0* 1.9* 1.8* 1.7* 1.8*   No results found for this basename: LIPASE, AMYLASE,  in the last 168 hours No results found for this basename: AMMONIA,  in the last 168 hours CBC:  Recent Labs Lab 10/30/12 0455 10/31/12 0640 11/01/12 0630 11/02/12 0500  WBC 18.3* 17.3* 19.6* 17.3*  HGB 10.1* 9.7* 9.2* 8.8*  HCT 28.3* 26.7* 25.2* 24.3*  MCV 98.3 97.8 98.4 98.4  PLT 287 284 322 200   Cardiac Enzymes: No results found for this basename: CKTOTAL, CKMB, CKMBINDEX, TROPONINI,  in the last 168 hours BNP (last 3 results) No results found for this basename: PROBNP,  in the last 8760 hours CBG:  Recent Labs Lab 11/03/12 1640 11/03/12 2008 11/03/12 2359 11/04/12 0407 11/04/12 0749  GLUCAP 98 130* 107* 95 117*    Recent Results (from the past 240 hour(s))  CLOSTRIDIUM DIFFICILE BY PCR     Status: None   Collection Time    10/28/12  5:01 AM      Result Value Range Status   C difficile by pcr NEGATIVE  NEGATIVE Final  STOOL CULTURE     Status: None   Collection Time    10/28/12  5:01 AM      Result Value Range Status   Specimen Description STOOL   Final   Special Requests NONE   Final   Culture     Final   Value: NO SALMONELLA, SHIGELLA, CAMPYLOBACTER, YERSINIA, OR E.COLI 0157:H7 ISOLATED     Performed at [facility]   Report Status 10/31/2012 FINAL   Final  CULTURE, BLOOD (ROUTINE X 2)     Status: None   Collection Time    10/30/12  3:40 PM      Result Value Range Status   Specimen Description BLOOD BLOOD RIGHT FOREARM   Final   Special Requests BOTTLES DRAWN AEROBIC AND ANAEROBIC 10CC  EACH   Final   Culture  Setup Time     Final   Value: 10/30/2012 22:08     Performed at [facility]   Culture     Final   Value: CITROBACTER FREUNDII     Note: Gram Stain Report Called to,Read Back By and Verified With: MARGARET CASSEL 10/31/12 1105 BY SMITHERSJ     Performed at [facility]   Report Status 11/02/2012 FINAL   Final   Organism ID, Bacteria CITROBACTER FREUNDII   Final  CULTURE, BLOOD (ROUTINE X 2)     Status: None   Collection Time    10/31/12 11:55 AM      Result Value Range Status   Specimen Description BLOOD RIGHT ARM   Final   Special Requests BOTTLES DRAWN AEROBIC AND ANAEROBIC 10CC   Final   Culture  Setup Time     Final   Value: 11/01/2012 07:19     Performed at [facility]   Culture     Final   Value:        BLOOD CULTURE RECEIVED NO GROWTH TO DATE CULTURE WILL BE HELD FOR 5 DAYS BEFORE ISSUING A FINAL NEGATIVE REPORT     Performed at [facility]   Report Status PENDING   Incomplete  CULTURE, BLOOD (ROUTINE X 2)     Status: None   Collection Time    11/01/12 12:55 AM      Result Value Range Status   Specimen Description BLOOD LEFT ARM   Final   Special Requests BOTTLES DRAWN AEROBIC ONLY 10CC   Final   Culture  Setup Time     Final   Value: 11/01/2012 09:47     Performed at [facility]   Culture     Final   Value:        BLOOD CULTURE RECEIVED NO GROWTH TO DATE CULTURE WILL BE HELD FOR 5 DAYS BEFORE ISSUING A FINAL NEGATIVE REPORT     Performed at [facility]   Report Status PENDING   Incomplete     Studies: No results found.  Scheduled Meds: . docusate sodium  100 mg Oral BID  . feeding supplement  1 Container Oral TID BM  . folic acid  1 mg Oral Daily  . hydrocortisone  25 mg Rectal q morning - 10a  . imipenem-cilastatin  500 mg Intravenous Q6H  . multivitamin with minerals  1 tablet Oral Daily  . pantoprazole (PROTONIX) IV  40 mg Intravenous Q12H  . sodium chloride  3 mL Intravenous Q12H   . thiamine  100 mg Oral Daily   Or  . thiamine  100 mg Intravenous Daily   Continuous Infusions: .  sodium bicarbonate 150 mEq in sterile water 1000 mL infusion 100 mL/hr at 11/03/12 2259    Principal Problem:   Jaundice Active Problems:   Hyponatremia   Anemia   Leucocytosis    Time spent: 53    [facility], Ryott Rafferty  [facility] Pager [phone] If 7PM-7AM, please contact night-coverage at [email or web address], password [facility] 11/04/2012, 10:31 AM  LOS: 8 days

## 2012-11-05 LAB — BASIC METABOLIC PANEL
BUN: 12 mg/dL (ref 6–23)
Creatinine, Ser: 0.72 mg/dL (ref 0.50–1.35)
GFR calc Af Amer: >90 mL/min (ref >90)
GFR calc non Af Amer: >90 mL/min (ref >90)
Potassium: 3.5 mEq/L (ref 3.5–5.1)
Sodium: 132 mEq/L — ABNORMAL LOW (ref 135–145)

## 2012-11-05 LAB — COMPREHENSIVE METABOLIC PANEL
ALT: 61 U/L — ABNORMAL HIGH (ref 0–53)
AST: 130 U/L — ABNORMAL HIGH (ref 0–37)
Alkaline Phosphatase: 162 U/L — ABNORMAL HIGH (ref 39–117)
BUN: 12 mg/dL (ref 6–23)
CO2: 18 mEq/L — ABNORMAL LOW (ref 19–32)
Chloride: 100 mEq/L (ref 96–112)
GFR calc Af Amer: >90 mL/min (ref >90)
GFR calc non Af Amer: >90 mL/min (ref >90)
Glucose, Bld: 109 mg/dL — ABNORMAL HIGH (ref 70–99)
Potassium: 2.7 mEq/L — CL (ref 3.5–5.1)
Sodium: 130 mEq/L — ABNORMAL LOW (ref 135–145)
Total Protein: 5.9 g/dL — ABNORMAL LOW (ref 6.0–8.3)

## 2012-11-05 LAB — CBC
HCT: 20.5 % — ABNORMAL LOW (ref 39.0–52.0)
MCHC: 35.6 g/dL (ref 30.0–36.0)
RDW: 23 % — ABNORMAL HIGH (ref 11.5–15.5)

## 2012-11-05 MED ORDER — POTASSIUM CHLORIDE CRYS ER 20 MEQ PO TBCR
40.0000 meq | EXTENDED_RELEASE_TABLET | Freq: Two times a day (BID) | ORAL | Status: DC
  Administered 2012-11-05 – 2012-11-06 (×2): 40 meq via ORAL
  Filled 2012-11-05 (×3): qty 2

## 2012-11-05 MED ORDER — POTASSIUM CHLORIDE CRYS ER 20 MEQ PO TBCR
40.0000 meq | EXTENDED_RELEASE_TABLET | Freq: Four times a day (QID) | ORAL | Status: DC
  Administered 2012-11-05 (×3): 40 meq via ORAL
  Filled 2012-11-05 (×2): qty 2

## 2012-11-05 MED ORDER — DOCUSATE SODIUM 100 MG PO CAPS
200.0000 mg | ORAL_CAPSULE | Freq: Two times a day (BID) | ORAL | Status: DC
  Administered 2012-11-05 – 2012-11-08 (×6): 200 mg via ORAL
  Filled 2012-11-05 (×6): qty 2

## 2012-11-05 MED ORDER — POTASSIUM CHLORIDE 10 MEQ/100ML IV SOLN
10.0000 meq | INTRAVENOUS | Status: AC
  Administered 2012-11-05 (×5): 10 meq via INTRAVENOUS
  Filled 2012-11-05 (×5): qty 100

## 2012-11-05 MED ORDER — HYDROCORTISONE ACETATE 25 MG RE SUPP
25.0000 mg | Freq: Two times a day (BID) | RECTAL | Status: DC
  Administered 2012-11-05 – 2012-11-08 (×6): 25 mg via RECTAL
  Filled 2012-11-05 (×7): qty 1

## 2012-11-05 NOTE — Progress Notes (Signed)
[facility] PROGRESS NOTE  [name] QMV:[id number] DOB: [birth year] DOA: 10/27/2012 PCP: No PCP Per Patient  Danny Arnold is a 37 y.o. male with no significant past medical history who drinks alcohol every day except for the last 4 days presented to the ER as patient noticed himself becoming yellowish. Patient also has been having constipation and nausea. He has abdominal pain when he moves his bowels. In the ER patient was found to be jaundiced with blood work showing bilirubin around 40 with CT abdomen and pelvis done with contrast showing signs of portal hypertension and hepatomegaly. 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 HIV-negative  - CT abdomen shows no evidence for cirrhosis although he has the portal hypertension, does not have any biliary obstruction on CT abdomen.  - GI was consulted, recommended continuing current workup and complete cessation of alcohol. Patient was explained through the interpreter line to stop alcohol drinking.  - Alkaline phosphatase is somewhat elevated with direct hyperbilirubinemia, no biliary obstruction. anemia panel to assess ferritin level (mildly elevated at 414).  -would add 40 mg of prednisone but blood culture was positive for gram neg rod- ID consulted and repeat culture ordered  Hyponatremia: ? dehydration and alcoholic liver disease; IVF - Continue to monitor, patient alert and  oriented   Hypokalemia-  -replete -recheck Mg  History of alcohol abuse:  - he reports drinking 6-12 pack of beer every day, hard liquor for the last 2 months.  - Continue CIWA protocol  - Counseled strongly for alcohol cessation   Anemia: 2 episodes of rectal bleeding, patient also reported constipation and straining  - Continue Anusol suppository, avoid constipation   Leucocytosis  - Continue abx, follow CBC       Code Status: full Family Communication: patient via interpreter Disposition Plan:    Consultants:  GI  Procedures:    Antibiotics:  primaxin 9/20  HPI/Subjective: Feeling better No SOB  Objective: Filed Vitals:   11/05/12 0500  BP: 106/57  Pulse: 97  Temp: 98 F (36.7 C)  Resp: 20    Intake/Output Summary (Last 24 hours) at 11/05/12 1037 Last data filed at 11/05/12 0600  Gross per 24 hour  Intake   2320 ml  Output      0 ml  Net   2320 ml   Filed Weights   10/30/12 0606 11/01/12 0500 11/02/12 0500  Weight: 75.6 kg (166 lb 10.7 oz) 75.388 kg (166 lb 3.2 oz) 70.024 kg (154 lb 6 oz)    Exam:  General: jaundiced HEENT: icteric sclera, PERLA, EOMI  CVS: S1-S2 clear, no murmur rubs or gallops  Chest: clear to auscultation bilaterally, no wheezing, rales or rhonchi  Abdomen: soft nontender,distended, normal bowel sounds  Extremities: no cyanosis, clubbing or edema noted bilaterally  Neuro: Cranial nerves II-XII intact, no focal neurological deficits   Data Reviewed: Basic Metabolic Panel:  Recent Labs Lab 10/30/12 0455 10/30/12 1413  11/02/12 1545 11/03/12 0206 11/03/12  1650 11/04/12 0625 11/05/12 0505  NA 126*  --   < > 130* 126* 128* 129* 130*  K 2.9*  --   < > 2.9* 2.5* 3.5 3.5 2.7*  CL 101  --   < > 108 103 104 105 100  CO2 10*  --   < > 9* 10* 15* 14* 18*  GLUCOSE 98  --   < > 99 94 107* 97 109*  BUN 11  --   < > 15 15 15 13 12   CREATININE 0.63  --   < > 0.69 0.73 0.69 0.65 0.65  CALCIUM 8.7  --   < > 7.8* 7.7*  7.6* 7.8* 7.4*  MG  --  2.8*  --   --   --   --   --   --   < > = values in this interval not displayed. Liver Function Tests:  Recent Labs Lab 11/01/12 0630 11/02/12 0500 11/03/12 0206 11/04/12 0625 11/05/12 0505  AST 148* 135* 131* 145* 130*  ALT 64* 64* 63* 65* 61*  ALKPHOS 177* 176* 163* 178* 162*  BILITOT 31.8* 29.1* 26.7* 26.8* 23.3*  PROT 6.8 6.4 6.0 6.2 5.9*  ALBUMIN 1.9* 1.8* 1.7* 1.8* 1.6*   No results found for this basename: LIPASE, AMYLASE,  in the last 168 hours  Recent Labs Lab 10/30/12 1414  AMMONIA Performed at [facility]   CBC:  Recent Labs Lab 10/30/12 0455 10/31/12 0640 11/01/12 0630 11/02/12 0500 11/05/12 0505  WBC 18.3* 17.3* 19.6* 17.3* 18.5*  HGB 10.1* 9.7* 9.2* 8.8* 7.3*  HCT 28.3* 26.7* 25.2* 24.3* 20.5*  MCV 98.3 97.8 98.4 98.4 99.0  PLT 287 284 322 200 137*   Cardiac Enzymes: No results found for this basename: CKTOTAL, CKMB, CKMBINDEX, TROPONINI,  in the last 168 hours BNP (last 3 results) No results found for this basename: PROBNP,  in the last 8760 hours CBG:  Recent Labs Lab 11/03/12 1640 11/03/12 2008 11/03/12 2359 11/04/12 0407 11/04/12 0749  GLUCAP 98 130* 107* 95 117*    Recent Results (from the past 240 hour(s))  CLOSTRIDIUM DIFFICILE BY PCR     Status: None   Collection Time    10/28/12  5:01 AM      Result Value Range Status   C difficile by pcr NEGATIVE  NEGATIVE Final  STOOL CULTURE     Status: None   Collection Time    10/28/12  5:01 AM      Result Value Range Status   Specimen Description STOOL   Final   Special Requests NONE   Final   Culture     Final   Value: NO SALMONELLA, SHIGELLA, CAMPYLOBACTER, YERSINIA, OR E.COLI 0157:H7 ISOLATED     Performed at [facility]   Report Status 10/31/2012 FINAL   Final  CULTURE, BLOOD (ROUTINE X 2)     Status: None   Collection Time    10/30/12  3:40 PM      Result Value Range Status   Specimen Description BLOOD BLOOD RIGHT FOREARM   Final    Special Requests BOTTLES DRAWN AEROBIC AND ANAEROBIC 10CC EACH   Final   Culture  Setup Time     Final   Value: 10/30/2012 22:08     Performed at [facility]   Culture     Final   Value: CITROBACTER FREUNDII     Note: Gram Stain Report Called to,Read Back By and Verified With: MARGARET CASSEL 10/31/12 1105  BY SMITHERSJ     Performed at [facility]   Report Status 11/02/2012 FINAL   Final   Organism ID, Bacteria CITROBACTER FREUNDII   Final  CULTURE, BLOOD (ROUTINE X 2)     Status: None   Collection Time    10/31/12 11:55 AM      Result Value Range Status   Specimen Description BLOOD RIGHT ARM   Final   Special Requests BOTTLES DRAWN AEROBIC AND ANAEROBIC 10CC   Final   Culture  Setup Time     Final   Value: 11/01/2012 07:19     Performed at [facility]   Culture     Final   Value:        BLOOD CULTURE RECEIVED NO GROWTH TO DATE CULTURE WILL BE HELD FOR 5 DAYS BEFORE ISSUING A FINAL NEGATIVE REPORT     Performed at [facility]   Report Status PENDING   Incomplete  CULTURE, BLOOD (ROUTINE X 2)     Status: None   Collection Time    11/01/12 12:55 AM      Result Value Range Status   Specimen Description BLOOD LEFT ARM   Final   Special Requests BOTTLES DRAWN AEROBIC ONLY 10CC   Final   Culture  Setup Time     Final   Value: 11/01/2012 09:47     Performed at [facility]   Culture     Final   Value:        BLOOD CULTURE RECEIVED NO GROWTH TO DATE CULTURE WILL BE HELD FOR 5 DAYS BEFORE ISSUING A FINAL NEGATIVE REPORT     Performed at [facility]   Report Status PENDING   Incomplete  CULTURE, BLOOD (ROUTINE X 2)     Status: None   Collection Time    11/03/12 10:35 PM      Result Value Range Status   Specimen Description BLOOD LEFT ARM   Final   Special Requests BOTTLES DRAWN AEROBIC AND ANAEROBIC 10CC EA   Final   Culture  Setup Time     Final   Value: 11/04/2012 04:11     Performed at [facility]   Culture     Final    Value:        BLOOD CULTURE RECEIVED NO GROWTH TO DATE CULTURE WILL BE HELD FOR 5 DAYS BEFORE ISSUING A FINAL NEGATIVE REPORT     Performed at [facility]   Report Status PENDING   Incomplete  CULTURE, BLOOD (ROUTINE X 2)     Status: None   Collection Time    11/03/12 10:45 PM      Result Value Range Status   Specimen Description BLOOD LEFT HAND   Final   Special Requests BOTTLES DRAWN AEROBIC AND ANAEROBIC 10CC EA   Final   Culture  Setup Time     Final   Value: 11/04/2012 04:11     Performed at [facility]   Culture     Final   Value:        BLOOD CULTURE RECEIVED NO GROWTH TO DATE CULTURE WILL BE HELD FOR 5 DAYS BEFORE ISSUING A FINAL NEGATIVE REPORT     Performed at [facility]   Report Status PENDING   Incomplete     Studies: No results found.  Scheduled Meds: . docusate sodium  100 mg Oral BID  . feeding supplement  1 Container Oral TID BM  . folic  acid  1 mg Oral Daily  . hydrocortisone  25 mg Rectal q morning - 10a  . imipenem-cilastatin  500 mg Intravenous Q6H  . multivitamin with minerals  1 tablet Oral Daily  . pantoprazole  40 mg Oral Daily  . potassium chloride  10 mEq Intravenous Q1 Hr x 5  . potassium chloride  40 mEq Oral QID  . sodium chloride  3 mL Intravenous Q12H  . thiamine  100 mg Oral Daily   Continuous Infusions: .  sodium bicarbonate 150 mEq in sterile water 1000 mL infusion 100 mL/hr at 11/04/12 1808    Principal Problem:   Jaundice Active Problems:   Hyponatremia   Anemia   Leucocytosis    Time spent: 52    [facility], Seydou Hearns  [facility] Pager [phone] If 7PM-7AM, please contact night-coverage at [email or web address], password [facility] 11/05/2012, 10:37 AM  LOS: 9 days

## 2012-11-05 NOTE — Progress Notes (Signed)
NUTRITION FOLLOW UP  Intervention:   Resource Breeze po TID, each supplement provides 250 kcal and 9 grams of protein.   NUTRITION DIAGNOSIS:  Unintentional weight loss related to alcohol abuse as evidenced by 6% weight loss x 2 weeks; ongoing.   Goal:  Pt to meet >/= 90% of their estimated nutrition needs, not met.   Monitor:  PO intake, supplement acceptance, labs, weight trend   Assessment:   Pt admitted with Jaundice with work up in progress. Pt with hx of ETOH abuse, per hx drinks 6-12 pack beer everyday.  Per RN pt is drinking Nurse, adult supplements. Appetite remains variable. Pt ate no Breakfast this am.  Potassium being replaced. Pt remains on thiamine and folic acid.   Height: Ht Readings from Last 1 Encounters:  10/28/12 5\' 7"  (1.702 m)    Weight Status:   Wt Readings from Last 1 Encounters:  11/02/12 154 lb 6 oz (70.024 kg)  No new weight  Re-estimated needs:  Kcal: 2050-2250  Protein: 100-115 grams  Fluid: >2 L/day  Skin: no issues noted  Diet Order: General Meal Completion: 0-70%   Intake/Output Summary (Last 24 hours) at 11/05/12 1238 Last data filed at 11/05/12 0730  Gross per 24 hour  Intake   2000 ml  Output      0 ml  Net   2000 ml    Last BM: 9/20   Labs:   Recent Labs Lab 10/30/12 0455 10/30/12 1413  11/03/12 1650 11/04/12 0625 11/05/12 0505  NA 126*  --   < > 128* 129* 130*  K 2.9*  --   < > 3.5 3.5 2.7*  CL 101  --   < > 104 105 100  CO2 10*  --   < > 15* 14* 18*  BUN 11  --   < > 15 13 12   CREATININE 0.63  --   < > 0.69 0.65 0.65  CALCIUM 8.7  --   < > 7.6* 7.8* 7.4*  MG  --  2.8*  --   --   --  1.8  GLUCOSE 98  --   < > 107* 97 109*  < > = values in this interval not displayed.  CBG (last 3)   Recent Labs  11/03/12 2359 11/04/12 0407 11/04/12 0749  GLUCAP 107* 95 117*    Scheduled Meds: . docusate sodium  100 mg Oral BID  . feeding supplement  1 Container Oral TID BM  . folic acid  1 mg Oral Daily  .  hydrocortisone  25 mg Rectal q morning - 10a  . imipenem-cilastatin  500 mg Intravenous Q6H  . multivitamin with minerals  1 tablet Oral Daily  . pantoprazole  40 mg Oral Daily  . potassium chloride  10 mEq Intravenous Q1 Hr x 5  . potassium chloride  40 mEq Oral QID  . sodium chloride  3 mL Intravenous Q12H  . thiamine  100 mg Oral Daily    Continuous Infusions: .  sodium bicarbonate 150 mEq in sterile water 1000 mL infusion 50 mL/hr at 11/05/12 [address], LDN, CNSC [phone] Pager [phone] After Hours Pager

## 2012-11-05 NOTE — Progress Notes (Signed)
Patient with complaints of pain when having a bowel movement tonight around 1900.  Blood observed on toilet seat and on pants.  Patient has had bleeding during this admission with previous bowel movements.  Spoke with Maren Reamer NP, new orders to follow for a H+H, increase colace to 200 BID, and to increase anusol suppository to BID. Nursing to continue to monitor.

## 2012-11-05 NOTE — Progress Notes (Signed)
Utilization review completed.  

## 2012-11-05 NOTE — Progress Notes (Signed)
Dr. Benjamine Mola aware of patient's critical potassium this am, new orders placed for IV and oral potassium.  Nursing will continue to monitor.

## 2012-11-05 NOTE — Progress Notes (Signed)
ANTIBIOTIC CONSULT NOTE - FOLLOW UP  Pharmacy Consult for Imipenem Indication: GNR bacteremia  No Known Allergies  Patient Measurements: Height: 5\' 7"  (170.2 cm) Weight: 154 lb 6 oz (70.024 kg) IBW/kg (Calculated) : 66.1  Vital Signs: Temp: 98 F (36.7 C) (09/23 0500) Temp src: Oral (09/23 0500) BP: 106/57 mmHg (09/23 0500) Pulse Rate: 97 (09/23 0500) Intake/Output from previous day: 09/22 0701 - 09/23 0700 In: 2560 [P.O.:1360; I.V.:1200] Out: -  Intake/Output from this shift:    Labs:  Recent Labs  11/03/12 1650 11/04/12 0625 11/05/12 0505  WBC  --   --  18.5*  HGB  --   --  7.3*  PLT  --   --  137*  CREATININE 0.69 0.65 0.65   Estimated Creatinine Clearance: 120.5 ml/min (by C-G formula based on Cr of 0.65). No results found for this basename: VANCOTROUGH, Leodis Binet, VANCORANDOM, GENTTROUGH, GENTPEAK, GENTRANDOM, TOBRATROUGH, TOBRAPEAK, TOBRARND, AMIKACINPEAK, AMIKACINTROU, AMIKACIN,  in the last 72 hours   Microbiology: Recent Results (from the past 720 hour(s))  CLOSTRIDIUM DIFFICILE BY PCR     Status: None   Collection Time    10/28/12  5:01 AM      Result Value Range Status   C difficile by pcr NEGATIVE  NEGATIVE Final  STOOL CULTURE     Status: None   Collection Time    10/28/12  5:01 AM      Result Value Range Status   Specimen Description STOOL   Final   Special Requests NONE   Final   Culture     Final   Value: NO SALMONELLA, SHIGELLA, CAMPYLOBACTER, YERSINIA, OR E.COLI 0157:H7 ISOLATED     Performed at [facility]   Report Status 10/31/2012 FINAL   Final  CULTURE, BLOOD (ROUTINE X 2)     Status: None   Collection Time    10/30/12  3:40 PM      Result Value Range Status   Specimen Description BLOOD BLOOD RIGHT FOREARM   Final   Special Requests BOTTLES DRAWN AEROBIC AND ANAEROBIC 10CC EACH   Final   Culture  Setup Time     Final   Value: 10/30/2012 22:08     Performed at [facility]   Culture     Final   Value: CITROBACTER  FREUNDII     Note: Gram Stain Report Called to,Read Back By and Verified With: MARGARET CASSEL 10/31/12 1105 BY SMITHERSJ     Performed at [facility]   Report Status 11/02/2012 FINAL   Final   Organism ID, Bacteria CITROBACTER FREUNDII   Final  CULTURE, BLOOD (ROUTINE X 2)     Status: None   Collection Time    10/31/12 11:55 AM      Result Value Range Status   Specimen Description BLOOD RIGHT ARM   Final   Special Requests BOTTLES DRAWN AEROBIC AND ANAEROBIC 10CC   Final   Culture  Setup Time     Final   Value: 11/01/2012 07:19     Performed at [facility]   Culture     Final   Value:        BLOOD CULTURE RECEIVED NO GROWTH TO DATE CULTURE WILL BE HELD FOR 5 DAYS BEFORE ISSUING A FINAL NEGATIVE REPORT     Performed at [facility]   Report Status PENDING   Incomplete  CULTURE, BLOOD (ROUTINE X 2)     Status: None   Collection Time    11/01/12  12:55 AM      Result Value Range Status   Specimen Description BLOOD LEFT ARM   Final   Special Requests BOTTLES DRAWN AEROBIC ONLY 10CC   Final   Culture  Setup Time     Final   Value: 11/01/2012 09:47     Performed at [facility]   Culture     Final   Value:        BLOOD CULTURE RECEIVED NO GROWTH TO DATE CULTURE WILL BE HELD FOR 5 DAYS BEFORE ISSUING A FINAL NEGATIVE REPORT     Performed at [facility]   Report Status PENDING   Incomplete  CULTURE, BLOOD (ROUTINE X 2)     Status: None   Collection Time    11/03/12 10:35 PM      Result Value Range Status   Specimen Description BLOOD LEFT ARM   Final   Special Requests BOTTLES DRAWN AEROBIC AND ANAEROBIC 10CC EA   Final   Culture  Setup Time     Final   Value: 11/04/2012 04:11     Performed at [facility]   Culture     Final   Value:        BLOOD CULTURE RECEIVED NO GROWTH TO DATE CULTURE WILL BE HELD FOR 5 DAYS BEFORE ISSUING A FINAL NEGATIVE REPORT     Performed at [facility]   Report Status PENDING   Incomplete   CULTURE, BLOOD (ROUTINE X 2)     Status: None   Collection Time    11/03/12 10:45 PM      Result Value Range Status   Specimen Description BLOOD LEFT HAND   Final   Special Requests BOTTLES DRAWN AEROBIC AND ANAEROBIC 10CC EA   Final   Culture  Setup Time     Final   Value: 11/04/2012 04:11     Performed at [facility]   Culture     Final   Value:        BLOOD CULTURE RECEIVED NO GROWTH TO DATE CULTURE WILL BE HELD FOR 5 DAYS BEFORE ISSUING A FINAL NEGATIVE REPORT     Performed at [facility]   Report Status PENDING   Incomplete    Anti-infectives   Start     Dose/Rate Route Frequency Ordered Stop   11/02/12 1200  imipenem-cilastatin (PRIMAXIN) 500 mg in sodium chloride 0.9 % 100 mL IVPB     500 mg 200 mL/hr over 30 Minutes Intravenous 4 times per day 11/02/12 0936     11/01/12 1430  ceFEPIme (MAXIPIME) 2 g in dextrose 5 % 50 mL IVPB  Status:  Discontinued     2 g 100 mL/hr over 30 Minutes Intravenous 3 times per day 11/01/12 1422 11/02/12 0857   10/28/12 0600  cefoTAXime (CLAFORAN) 1 g in dextrose 5 % 50 mL IVPB  Status:  Discontinued     1 g 100 mL/hr over 30 Minutes Intravenous 3 times per day 10/28/12 0454 11/01/12 1422      Assessment: 37 yo M presents w/ jaundice x 1wk. Pt has liver/biliary disease 2/2 chronic alcohol use.  Pt BCx returned as CITROBACTER FREUNDII (Resistant to rocephin, ancef, cefoxitin, ceftaz, zosyn). Due to resistance, per ID start Imipenem-cilastatin to stop 2 weeks from 9/21.   WBC up a little to 18.5, creat 0.65, AF.    Cdif neg; Hep C neg; HIV neg 9/15 stool>>ng 9/17 BCx>> CITROBACTER FREUNDII (Resistant to rocephin, ancef, cefoxitin, ceftaz, zosyn)  9/18 BCx>>ngtd 9/19 BCx>>ngtd 9/21 blood x 2 -ngtd  9/15 Cefotaxime >>9/19 9/19 Cefepime >>9/20- d/c'd by ID 9/20 Imipenem >>   Plan:  Continue  Imipenem-Cilastatin 500mg  q6hr - to change to cipro on DC home abx for 2 weeks from 9/21. To end 10/5 - Monitor renal function,  clinical progression, and f/u repeat BCx Herby Abraham, Pharm.D. [phone] 11/05/2012 10:29 AM

## 2012-11-06 LAB — COMPREHENSIVE METABOLIC PANEL
ALT: 66 U/L — ABNORMAL HIGH (ref 0–53)
Albumin: 1.5 g/dL — ABNORMAL LOW (ref 3.5–5.2)
Alkaline Phosphatase: 166 U/L — ABNORMAL HIGH (ref 39–117)
BUN: 11 mg/dL (ref 6–23)
Calcium: 7.4 mg/dL — ABNORMAL LOW (ref 8.4–10.5)
Potassium: 3.2 mEq/L — ABNORMAL LOW (ref 3.5–5.1)
Sodium: 134 mEq/L — ABNORMAL LOW (ref 135–145)
Total Protein: 6 g/dL (ref 6.0–8.3)

## 2012-11-06 LAB — HEMOGLOBIN AND HEMATOCRIT, BLOOD
HCT: 19.7 % — ABNORMAL LOW (ref 39.0–52.0)
HCT: 20 % — ABNORMAL LOW (ref 39.0–52.0)
Hemoglobin: 7.2 g/dL — ABNORMAL LOW (ref 13.0–17.0)
Hemoglobin: 7.1 g/dL — ABNORMAL LOW (ref 13.0–17.0)

## 2012-11-06 MED ORDER — SPIRONOLACTONE 50 MG PO TABS
50.0000 mg | ORAL_TABLET | Freq: Every day | ORAL | Status: DC
  Administered 2012-11-06: 50 mg via ORAL
  Filled 2012-11-06 (×2): qty 1

## 2012-11-06 MED ORDER — POTASSIUM CHLORIDE CRYS ER 20 MEQ PO TBCR
40.0000 meq | EXTENDED_RELEASE_TABLET | Freq: Every day | ORAL | Status: DC
  Administered 2012-11-07 – 2012-11-08 (×2): 40 meq via ORAL
  Filled 2012-11-06 (×2): qty 2

## 2012-11-06 MED ORDER — POLYETHYLENE GLYCOL 3350 17 G PO PACK
17.0000 g | PACK | Freq: Two times a day (BID) | ORAL | Status: DC
  Administered 2012-11-06 – 2012-11-08 (×4): 17 g via ORAL
  Filled 2012-11-06 (×6): qty 1

## 2012-11-06 MED ORDER — SODIUM BICARBONATE 650 MG PO TABS
650.0000 mg | ORAL_TABLET | Freq: Two times a day (BID) | ORAL | Status: DC
  Administered 2012-11-06 – 2012-11-08 (×4): 650 mg via ORAL
  Filled 2012-11-06 (×5): qty 1

## 2012-11-06 NOTE — Progress Notes (Addendum)
PATIENT DETAILS Name: Danny Arnold Age: 37 y.o. Sex: male Date of Birth: 01-20-1976 Admit Date: 10/27/2012 Admitting Physician Eduard Clos, MD PCP:No PCP Per Patient  Subjective: No major issues-per RN no major rectal bleeding today  Assessment/Plan: Principal Problem:   Jaundice -secondary alcoholic hepatitis -viral serology negative -downtrending bilirubin -avoid hepatotoxic meds, avoid further ETOH  Active Problems: Citrobacter Bacteremia -c/w Primaxin-transition to Cipro on discharge  Anemia -secondary to critical illness,?blood loss -monitor Hb   Rectal Bleeding -?hemorrhoids -c/w Anusol suppository -Miralad BID  Non Gapped Metabolic Acidosis -stop bicarb gtt-change to oral bicarb  Edema -2/2 IVF, Hypoalbuminemia -start Aldactone -will see if BP tolerated Inderal    Hyponatremia -2/2 to fluid overload -stop IVF-start Aldactone  Disposition: Remain inpatient  DVT Prophylaxis:  SCD's  Code Status: Full code   Family Communication None at bedside  Procedures:  None  CONSULTS:  GI   MEDICATIONS: Scheduled Meds: . docusate sodium  200 mg Oral BID  . feeding supplement  1 Container Oral TID BM  . folic acid  1 mg Oral Daily  . hydrocortisone  25 mg Rectal BID  . imipenem-cilastatin  500 mg Intravenous Q6H  . multivitamin with minerals  1 tablet Oral Daily  . pantoprazole  40 mg Oral Daily  . [START ON 11/07/2012] potassium chloride  40 mEq Oral Daily  . sodium bicarbonate  650 mg Oral BID  . sodium chloride  3 mL Intravenous Q12H  . spironolactone  50 mg Oral Daily  . thiamine  100 mg Oral Daily   Continuous Infusions:  PRN Meds:.ondansetron (ZOFRAN) IV, ondansetron, polyethylene glycol  Antibiotics: Anti-infectives   Start     Dose/Rate Route Frequency Ordered Stop   11/02/12 1200  imipenem-cilastatin (PRIMAXIN) 500 mg in sodium chloride 0.9 % 100 mL IVPB     500 mg 200 mL/hr over 30 Minutes Intravenous 4 times per day  11/02/12 0936     11/01/12 1430  ceFEPIme (MAXIPIME) 2 g in dextrose 5 % 50 mL IVPB  Status:  Discontinued     2 g 100 mL/hr over 30 Minutes Intravenous 3 times per day 11/01/12 1422 11/02/12 0857   10/28/12 0600  cefoTAXime (CLAFORAN) 1 g in dextrose 5 % 50 mL IVPB  Status:  Discontinued     1 g 100 mL/hr over 30 Minutes Intravenous 3 times per day 10/28/12 0454 11/01/12 1422       PHYSICAL EXAM: Vital signs in last 24 hours: Filed Vitals:   11/05/12 2001 11/06/12 0500 11/06/12 0546 11/06/12 1452  BP: 107/60  110/53 119/63  Pulse: 96  100 60  Temp: 99.3 F (37.4 C)  98.5 F (36.9 C) 98.8 F (37.1 C)  TempSrc:    Oral  Resp: 18  18   Height:      Weight:  80.876 kg (178 lb 4.8 oz)    SpO2: 100%  100% 100%    Weight change:  Filed Weights   11/01/12 0500 11/02/12 0500 11/06/12 0500  Weight: 75.388 kg (166 lb 3.2 oz) 70.024 kg (154 lb 6 oz) 80.876 kg (178 lb 4.8 oz)   Body mass index is 27.92 kg/(m^2).   Gen Exam: Awake and alert with clear speech.  +icterus Neck: Supple, No JVD.   Chest: B/L Clear.   CVS: S1 S2 Regular, no murmurs.  Abdomen: soft, BS +, non tender, non distended.  Extremities: 2+ edema, lower extremities warm to touch. Neurologic: Non Focal.   Skin: No Rash.   Wounds:  N/A.    Intake/Output from previous day:  Intake/Output Summary (Last 24 hours) at 11/06/12 1617 Last data filed at 11/06/12 1230  Gross per 24 hour  Intake   1780 ml  Output      1 ml  Net   1779 ml     LAB RESULTS: CBC  Recent Labs Lab 10/31/12 0640 11/01/12 0630 11/02/12 0500 11/05/12 0505 11/05/12 2245  WBC 17.3* 19.6* 17.3* 18.5*  --   HGB 9.7* 9.2* 8.8* 7.3* 7.2*  HCT 26.7* 25.2* 24.3* 20.5* 19.7*  PLT 284 322 200 137*  --   MCV 97.8 98.4 98.4 99.0  --   MCH 35.5* 35.9* 35.6* 35.3*  --   MCHC 36.3* 36.5* 36.2* 35.6  --   RDW 24.7* 24.8* 24.2* 23.0*  --     Chemistries   Recent Labs Lab 11/03/12 1650 11/04/12 0625 11/05/12 0505 11/05/12 1707  11/06/12 0940  NA 128* 129* 130* 132* 134*  K 3.5 3.5 2.7* 3.5 3.2*  CL 104 105 100 102 106  CO2 15* 14* 18* 18* 18*  GLUCOSE 107* 97 109* 126* 108*  BUN 15 13 12 12 11   CREATININE 0.69 0.65 0.65 0.72 0.68  CALCIUM 7.6* 7.8* 7.4* 7.4* 7.4*  MG  --   --  1.8  --   --     CBG:  Recent Labs Lab 11/03/12 1640 11/03/12 2008 11/03/12 2359 11/04/12 0407 11/04/12 0749  GLUCAP 98 130* 107* 95 117*    GFR Estimated Creatinine Clearance: 131.3 ml/min (by C-G formula based on Cr of 0.68).  Coagulation profile  Recent Labs Lab 10/31/12 1130  INR 1.50*    Cardiac Enzymes No results found for this basename: CK, CKMB, TROPONINI, MYOGLOBIN,  in the last 168 hours  No components found with this basename: POCBNP,  No results found for this basename: DDIMER,  in the last 72 hours No results found for this basename: HGBA1C,  in the last 72 hours No results found for this basename: CHOL, HDL, LDLCALC, TRIG, CHOLHDL, LDLDIRECT,  in the last 72 hours No results found for this basename: TSH, T4TOTAL, FREET3, T3FREE, THYROIDAB,  in the last 72 hours No results found for this basename: VITAMINB12, FOLATE, FERRITIN, TIBC, IRON, RETICCTPCT,  in the last 72 hours No results found for this basename: LIPASE, AMYLASE,  in the last 72 hours  Urine Studies No results found for this basename: UACOL, UAPR, USPG, UPH, UTP, UGL, UKET, UBIL, UHGB, UNIT, UROB, ULEU, UEPI, UWBC, URBC, UBAC, CAST, CRYS, UCOM, BILUA,  in the last 72 hours  MICROBIOLOGY: Recent Results (from the past 240 hour(s))  CLOSTRIDIUM DIFFICILE BY PCR     Status: None   Collection Time    10/28/12  5:01 AM      Result Value Range Status   C difficile by pcr NEGATIVE  NEGATIVE Final  STOOL CULTURE     Status: None   Collection Time    10/28/12  5:01 AM      Result Value Range Status   Specimen Description STOOL   Final   Special Requests NONE   Final   Culture     Final   Value: NO SALMONELLA, SHIGELLA, CAMPYLOBACTER,  YERSINIA, OR E.COLI 0157:H7 ISOLATED     Performed at [facility]   Report Status 10/31/2012 FINAL   Final  CULTURE, BLOOD (ROUTINE X 2)     Status: None   Collection Time    10/30/12  3:40 PM  Result Value Range Status   Specimen Description BLOOD BLOOD RIGHT FOREARM   Final   Special Requests BOTTLES DRAWN AEROBIC AND ANAEROBIC 10CC EACH   Final   Culture  Setup Time     Final   Value: 10/30/2012 22:08     Performed at [facility]   Culture     Final   Value: CITROBACTER FREUNDII     Note: Gram Stain Report Called to,Read Back By and Verified With: MARGARET CASSEL 10/31/12 1105 BY SMITHERSJ     Performed at [facility]   Report Status 11/02/2012 FINAL   Final   Organism ID, Bacteria CITROBACTER FREUNDII   Final  CULTURE, BLOOD (ROUTINE X 2)     Status: None   Collection Time    10/31/12 11:55 AM      Result Value Range Status   Specimen Description BLOOD RIGHT ARM   Final   Special Requests BOTTLES DRAWN AEROBIC AND ANAEROBIC 10CC   Final   Culture  Setup Time     Final   Value: 11/01/2012 07:19     Performed at [facility]   Culture     Final   Value:        BLOOD CULTURE RECEIVED NO GROWTH TO DATE CULTURE WILL BE HELD FOR 5 DAYS BEFORE ISSUING A FINAL NEGATIVE REPORT     Performed at [facility]   Report Status PENDING   Incomplete  CULTURE, BLOOD (ROUTINE X 2)     Status: None   Collection Time    11/01/12 12:55 AM      Result Value Range Status   Specimen Description BLOOD LEFT ARM   Final   Special Requests BOTTLES DRAWN AEROBIC ONLY 10CC   Final   Culture  Setup Time     Final   Value: 11/01/2012 09:47     Performed at [facility]   Culture     Final   Value:        BLOOD CULTURE RECEIVED NO GROWTH TO DATE CULTURE WILL BE HELD FOR 5 DAYS BEFORE ISSUING A FINAL NEGATIVE REPORT     Performed at [facility]   Report Status PENDING   Incomplete  CULTURE, BLOOD (ROUTINE X 2)     Status: None   Collection  Time    11/03/12 10:35 PM      Result Value Range Status   Specimen Description BLOOD LEFT ARM   Final   Special Requests BOTTLES DRAWN AEROBIC AND ANAEROBIC 10CC EA   Final   Culture  Setup Time     Final   Value: 11/04/2012 04:11     Performed at [facility]   Culture     Final   Value:        BLOOD CULTURE RECEIVED NO GROWTH TO DATE CULTURE WILL BE HELD FOR 5 DAYS BEFORE ISSUING A FINAL NEGATIVE REPORT     Performed at [facility]   Report Status PENDING   Incomplete  CULTURE, BLOOD (ROUTINE X 2)     Status: None   Collection Time    11/03/12 10:45 PM      Result Value Range Status   Specimen Description BLOOD LEFT HAND   Final   Special Requests BOTTLES DRAWN AEROBIC AND ANAEROBIC 10CC EA   Final   Culture  Setup Time     Final   Value: 11/04/2012 04:11     Performed at [facility]  Culture     Final   Value:        BLOOD CULTURE RECEIVED NO GROWTH TO DATE CULTURE WILL BE HELD FOR 5 DAYS BEFORE ISSUING A FINAL NEGATIVE REPORT     Performed at [facility]   Report Status PENDING   Incomplete    RADIOLOGY STUDIES/RESULTS: US Abdomen Complete  10/30/2012   *RADIOLOGY REPORT*  Clinical Data: Increasing liver enzymes.  ABDOMEN ULTRASOUND  Technique:  Complete abdominal ultrasound examination was performed including evaluation of the liver, gallbladder, bile ducts, pancreas, kidneys, spleen, IVC, and abdominal aorta.  Comparison: CT scan from 10/28/2012  Findings:  Gallbladder:  Gallbladder wall is thickened at 6 mm and shows intramural edema.  No evidence for pericholecystic fluid. Tumefactive sludge is noted within the lumen but no definite gallstones are evident.  The sonographer reports no sonographic Murphy's sign.  Common Bile Duct:  Nondilated at 3 mm diameter.  Liver:  Echogenic liver suggests fatty infiltration.  Liver is noted to be enlarged on the recent CT scan.  No intrahepatic biliary duct dilatation.  IVC:  Normal.  Pancreas:  Not well  seen secondary overlying midline bowel gas.  Spleen:  Normal.  Right kidney:  13.2 cm in long axis.  Normal.  Left kidney:  12.3 cm in long axis.  Normal.  Abdominal Aorta:  No aneurysm is evident.  No evidence for ascites.  IMPRESSION: Gallbladder sludge with gallbladder wall thickening and intramural edema.  No gallstones are evident, but acute cholecystitis cannot be excluded by imaging.  In the setting of liver disease, gallbladder wall thickening can be nonacute, there is no evidence for associated ascites on the current study.  Nuclear scintigraphy may prove helpful to evaluate for acute cholecystitis as clinically warranted.   Original Report Authenticated By: Kennith Center, M.D.   Ct Abdomen Pelvis W Contrast  10/28/2012   CLINICAL DATA:  Jaundice  EXAM: CT ABDOMEN AND PELVIS WITH CONTRAST  TECHNIQUE: Multidetector CT imaging of the abdomen and pelvis was performed using the standard protocol following bolus administration of intravenous contrast.  CONTRAST:  OMNIPAQUE IOHEXOL 300 MG/ML  SOLN  COMPARISON:  None.  FINDINGS: BODY WALL: Fat containing umbilical hernia  LOWER CHEST:  Mediastinum: Unremarkable.  Lungs/pleura: Elevated right diaphragm with mild basilar atelectasis  ABDOMEN/PELVIS:  Liver: Enlarged liver (25 cm craniocaudal span midclavicular line), with diffuse patchy hypodensity or hypo-enhancement. No focal abnormality or definitive nodular contour. There is a recannulized/enlarged periumbilical vein.  Biliary: No evidence of biliary obstruction or stone.  Pancreas: Unremarkable.  Spleen: Enlarged at 14 cm craniocaudal span. No focal abnormality.  Adrenals: Unremarkable.  Kidneys and ureters: No hydronephrosis or stone.  Bladder: Unremarkable.  Bowel: No obstruction. Normal appendix.  Retroperitoneum: No mass or adenopathy.  Peritoneum: No free fluid or gas.  Reproductive: Unremarkable.  Vascular: No acute abnormality.  OSSEOUS: No acute abnormalities. No suspicious lytic or blastic  lesions.  IMPRESSION: 1. Hepatomegaly and heterogeneous liver parenchyma that could represent fatty infiltration or hepatitis. 2. Although no overt cirrhotic change, there is evidence of portal hypertension with splenomegaly and enlarged paraumbilical vein. 3. No evidence of biliary obstruction to explain jaundice.   Electronically Signed   By: Tiburcio Pea   On: 10/28/2012 00:52   Dg Abd 2 Views  10/30/2012   CLINICAL DATA:  Mid abdominal pain and pressure  EXAM: ABDOMEN - 2 VIEW  COMPARISON:  CT abdomen 10/28/2012  FINDINGS: The liver is enlarged similar to comparison CT. There are no  dilated loops of large or small bowel. Moderate volume of stool in the ascending colon. Gas in the rectum. No pathologic calcifications.  IMPRESSION: 1. Hepatomegaly.  2. No evidence of bowel obstruction.   Electronically Signed   By: Genevive Bi M.D.   On: 10/30/2012 16:50   Dg Abd Acute W/chest  10/27/2012   CLINICAL DATA:  Fever and constipation. Abdominal pain.  EXAM: ACUTE ABDOMEN SERIES (ABDOMEN 2 VIEW & CHEST 1 VIEW)  COMPARISON:  None.  FINDINGS: Elevated right diaphragm of uncertain cause. Low volume chest without focal opacity. No cardiomegaly.  Question hepatomegaly given elevated right diaphragm and extension nearly to the pelvic inlet. The stomach appears slightly displaced to the left additionally. Nonobstructive bowel gas pattern. No abnormal intra-abdominal calcification. Negative osseous structures.  IMPRESSION: 1. Nonobstructive bowel gas pattern. No pneumoperitoneum. 2. Elevated right diaphragm, possibly from hepatomegaly. 3. Clear chest.   Electronically Signed   By: Tiburcio Pea   On: 10/27/2012 21:54    Jeoffrey Massed, MD  [facility] Pager:[phone]  If 7PM-7AM, please contact night-coverage [email or web address] Password [email or web address] 11/06/2012, 4:17 PM   LOS: 10 days

## 2012-11-07 LAB — COMPREHENSIVE METABOLIC PANEL
ALT: 71 U/L — ABNORMAL HIGH (ref 0–53)
Albumin: 1.5 g/dL — ABNORMAL LOW (ref 3.5–5.2)
Alkaline Phosphatase: 172 U/L — ABNORMAL HIGH (ref 39–117)
BUN: 10 mg/dL (ref 6–23)
Chloride: 104 mEq/L (ref 96–112)
Creatinine, Ser: 0.69 mg/dL (ref 0.50–1.35)
GFR calc Af Amer: >90 mL/min (ref >90)
GFR calc non Af Amer: >90 mL/min (ref >90)
Glucose, Bld: 98 mg/dL (ref 70–99)
Sodium: 131 mEq/L — ABNORMAL LOW (ref 135–145)
Total Protein: 6.1 g/dL (ref 6.0–8.3)

## 2012-11-07 LAB — CBC
HCT: 18.9 % — ABNORMAL LOW (ref 39.0–52.0)
Hemoglobin: 6.7 g/dL — CL (ref 13.0–17.0)
MCH: 35.4 pg — ABNORMAL HIGH (ref 26.0–34.0)
MCHC: 35.4 g/dL (ref 30.0–36.0)
MCV: 100 fL (ref 78.0–100.0)
RDW: 22.9 % — ABNORMAL HIGH (ref 11.5–15.5)

## 2012-11-07 LAB — CULTURE, BLOOD (ROUTINE X 2): Culture: NO GROWTH

## 2012-11-07 LAB — PREPARE RBC (CROSSMATCH)

## 2012-11-07 MED ORDER — SPIRONOLACTONE 100 MG PO TABS
100.0000 mg | ORAL_TABLET | Freq: Every day | ORAL | Status: DC
  Administered 2012-11-08: 100 mg via ORAL
  Filled 2012-11-07: qty 1

## 2012-11-07 MED ORDER — DIPHENHYDRAMINE HCL 50 MG/ML IJ SOLN
25.0000 mg | Freq: Once | INTRAMUSCULAR | Status: AC
  Administered 2012-11-07: 25 mg via INTRAVENOUS
  Filled 2012-11-07: qty 1

## 2012-11-07 MED ORDER — PROPRANOLOL HCL 10 MG PO TABS
10.0000 mg | ORAL_TABLET | Freq: Two times a day (BID) | ORAL | Status: DC
  Administered 2012-11-07 – 2012-11-08 (×3): 10 mg via ORAL
  Filled 2012-11-07 (×5): qty 1

## 2012-11-07 MED ORDER — FUROSEMIDE 10 MG/ML IJ SOLN
20.0000 mg | Freq: Once | INTRAMUSCULAR | Status: AC
  Administered 2012-11-07: 20 mg via INTRAVENOUS
  Filled 2012-11-07: qty 2

## 2012-11-07 NOTE — Progress Notes (Signed)
11/07/12 Spoke with patient on 11/06/12 with interpreter about follow up appt at [facility]. Patient agreeable to going. Unable to get an appt before 11/26/12, informed Dr. Jerral Ralph, he was able to get an appt for 11/14/12 at 2:30pm. I gave patient appt information. Gave patient discount pharmacy card. Referrals made to financial conselor and CSW. Will continue to follow for d/c needs. Jacquelynn Cree RN, BSN, CCM

## 2012-11-07 NOTE — Progress Notes (Signed)
CRITICAL VALUE ALERT  Critical value received: Hgb 6.7  Date of notification:  11/07/12  Time of notification:  1050  Critical value read back:yes  Nurse who received alert:  Brett Fairy, RN  MD notified (1st page):  Dr. Jerral Ralph  Time of first page:  1100  MD notified (2nd page):  Time of second page:  Responding MD:  Dr. Jerral Ralph  Time MD responded:  1106

## 2012-11-07 NOTE — Progress Notes (Signed)
PATIENT DETAILS Name: Danny Arnold Age: 37 y.o. Sex: male Date of Birth: September 04, 1975 Admit Date: 10/27/2012 Admitting Physician Eduard Clos, MD PCP:No PCP Per Patient  Subjective: No major issues-denies rectal bleeding. Main complaint is Leg edema  Assessment/Plan: Principal Problem:   Jaundice -secondary alcoholic hepatitis -viral serology negative -downtrending bilirubin -avoid hepatotoxic meds, avoid further ETOH  Active Problems: Citrobacter Bacteremia -c/w Primaxin-transition to Cipro on discharge  Anemia -secondary to critical illness,?blood loss -monitor Hb-and transfuse prn  Rectal Bleeding -?hemorrhoids-seems to have resolved -c/w Anusol suppository -Miralax BID  Non Gapped Metabolic Acidosis -stop bicarb gtt-change to oral bicarb  Edema -2/2 IVF, Hypoalbuminemia, ?liver cirrhoses  -start Aldactone given portal HTN/Spleenomegaly on CT Abd -will see if BP tolerated Inderal    Hyponatremia -2/2 to fluid overload -stop IVF-start Aldactone  Hypokalemia -decrease KCL as on Aldactone  Portal HTN/Spleenomegaly -now on Aldactone-should help with edema and hypokalemia -start low dose propranolol -follow BP  Disposition: Remain inpatient  DVT Prophylaxis:  SCD's  Code Status: Full code   Family Communication None at bedside  Procedures:  None  CONSULTS:  GI   MEDICATIONS: Scheduled Meds: . docusate sodium  200 mg Oral BID  . feeding supplement  1 Container Oral TID BM  . folic acid  1 mg Oral Daily  . hydrocortisone  25 mg Rectal BID  . imipenem-cilastatin  500 mg Intravenous Q6H  . multivitamin with minerals  1 tablet Oral Daily  . pantoprazole  40 mg Oral Daily  . polyethylene glycol  17 g Oral BID  . potassium chloride  40 mEq Oral Daily  . propranolol  10 mg Oral BID  . sodium bicarbonate  650 mg Oral BID  . sodium chloride  3 mL Intravenous Q12H  . [START ON 11/08/2012] spironolactone  100 mg Oral Daily  . thiamine   100 mg Oral Daily   Continuous Infusions:  PRN Meds:.ondansetron (ZOFRAN) IV, ondansetron  Antibiotics: Anti-infectives   Start     Dose/Rate Route Frequency Ordered Stop   11/02/12 1200  imipenem-cilastatin (PRIMAXIN) 500 mg in sodium chloride 0.9 % 100 mL IVPB     500 mg 200 mL/hr over 30 Minutes Intravenous 4 times per day 11/02/12 0936     11/01/12 1430  ceFEPIme (MAXIPIME) 2 g in dextrose 5 % 50 mL IVPB  Status:  Discontinued     2 g 100 mL/hr over 30 Minutes Intravenous 3 times per day 11/01/12 1422 11/02/12 0857   10/28/12 0600  cefoTAXime (CLAFORAN) 1 g in dextrose 5 % 50 mL IVPB  Status:  Discontinued     1 g 100 mL/hr over 30 Minutes Intravenous 3 times per day 10/28/12 0454 11/01/12 1422       PHYSICAL EXAM: Vital signs in last 24 hours: Filed Vitals:   11/06/12 1452 11/06/12 2310 11/07/12 0500 11/07/12 0537  BP: 119/63 124/64  120/66  Pulse: 60 75  80  Temp: 98.8 F (37.1 C) 98.8 F (37.1 C)  98.9 F (37.2 C)  TempSrc: Oral Oral  Oral  Resp:  16  16  Height:      Weight:   78.725 kg (173 lb 8.9 oz)   SpO2: 100% 99%  97%    Weight change: -2.151 kg (-4 lb 11.9 oz) Filed Weights   11/02/12 0500 11/06/12 0500 11/07/12 0500  Weight: 70.024 kg (154 lb 6 oz) 80.876 kg (178 lb 4.8 oz) 78.725 kg (173 lb 8.9 oz)   Body mass index is 27.18 kg/(m^2).  Gen Exam: Awake and alert with clear speech.  +icterus Neck: Supple, No JVD.   Chest: B/L Clear.   CVS: S1 S2 Regular, no murmurs.  Abdomen: soft, BS +, non tender, non distended.  Extremities: 2+ edema, lower extremities warm to touch. Neurologic: Non Focal.   Skin: No Rash.   Wounds: N/A.    Intake/Output from previous day:  Intake/Output Summary (Last 24 hours) at 11/07/12 1024 Last data filed at 11/07/12 0650  Gross per 24 hour  Intake    920 ml  Output      0 ml  Net    920 ml     LAB RESULTS: CBC  Recent Labs Lab 11/01/12 0630 11/02/12 0500 11/05/12 0505 11/05/12 2245 11/06/12 1746  WBC  19.6* 17.3* 18.5*  --   --   HGB 9.2* 8.8* 7.3* 7.2* 7.1*  HCT 25.2* 24.3* 20.5* 19.7* 20.0*  PLT 322 200 137*  --   --   MCV 98.4 98.4 99.0  --   --   MCH 35.9* 35.6* 35.3*  --   --   MCHC 36.5* 36.2* 35.6  --   --   RDW 24.8* 24.2* 23.0*  --   --     Chemistries   Recent Labs Lab 11/03/12 1650 11/04/12 0625 11/05/12 0505 11/05/12 1707 11/06/12 0940  NA 128* 129* 130* 132* 134*  K 3.5 3.5 2.7* 3.5 3.2*  CL 104 105 100 102 106  CO2 15* 14* 18* 18* 18*  GLUCOSE 107* 97 109* 126* 108*  BUN 15 13 12 12 11   CREATININE 0.69 0.65 0.65 0.72 0.68  CALCIUM 7.6* 7.8* 7.4* 7.4* 7.4*  MG  --   --  1.8  --   --     CBG:  Recent Labs Lab 11/03/12 1640 11/03/12 2008 11/03/12 2359 11/04/12 0407 11/04/12 0749  GLUCAP 98 130* 107* 95 117*    GFR Estimated Creatinine Clearance: 120.5 ml/min (by C-G formula based on Cr of 0.68).  Coagulation profile  Recent Labs Lab 10/31/12 1130  INR 1.50*    Cardiac Enzymes No results found for this basename: CK, CKMB, TROPONINI, MYOGLOBIN,  in the last 168 hours  No components found with this basename: POCBNP,  No results found for this basename: DDIMER,  in the last 72 hours No results found for this basename: HGBA1C,  in the last 72 hours No results found for this basename: CHOL, HDL, LDLCALC, TRIG, CHOLHDL, LDLDIRECT,  in the last 72 hours No results found for this basename: TSH, T4TOTAL, FREET3, T3FREE, THYROIDAB,  in the last 72 hours No results found for this basename: VITAMINB12, FOLATE, FERRITIN, TIBC, IRON, RETICCTPCT,  in the last 72 hours No results found for this basename: LIPASE, AMYLASE,  in the last 72 hours  Urine Studies No results found for this basename: UACOL, UAPR, USPG, UPH, UTP, UGL, UKET, UBIL, UHGB, UNIT, UROB, ULEU, UEPI, UWBC, URBC, UBAC, CAST, CRYS, UCOM, BILUA,  in the last 72 hours  MICROBIOLOGY: Recent Results (from the past 240 hour(s))  CULTURE, BLOOD (ROUTINE X 2)     Status: None   Collection  Time    10/30/12  3:40 PM      Result Value Range Status   Specimen Description BLOOD BLOOD RIGHT FOREARM   Final   Special Requests BOTTLES DRAWN AEROBIC AND ANAEROBIC 10CC EACH   Final   Culture  Setup Time     Final   Value: 10/30/2012 22:08     Performed at  [facility]   Culture     Final   Value: CITROBACTER FREUNDII     Note: Gram Stain Report Called to,Read Back By and Verified With: MARGARET CASSEL 10/31/12 1105 BY SMITHERSJ     Performed at [facility]   Report Status 11/02/2012 FINAL   Final   Organism ID, Bacteria CITROBACTER FREUNDII   Final  CULTURE, BLOOD (ROUTINE X 2)     Status: None   Collection Time    10/31/12 11:55 AM      Result Value Range Status   Specimen Description BLOOD RIGHT ARM   Final   Special Requests BOTTLES DRAWN AEROBIC AND ANAEROBIC 10CC   Final   Culture  Setup Time     Final   Value: 11/01/2012 07:19     Performed at [facility]   Culture     Final   Value: NO GROWTH 5 DAYS     Performed at [facility]   Report Status 11/07/2012 FINAL   Final  CULTURE, BLOOD (ROUTINE X 2)     Status: None   Collection Time    11/01/12 12:55 AM      Result Value Range Status   Specimen Description BLOOD LEFT ARM   Final   Special Requests BOTTLES DRAWN AEROBIC ONLY 10CC   Final   Culture  Setup Time     Final   Value: 11/01/2012 09:47     Performed at [facility]   Culture     Final   Value: NO GROWTH 5 DAYS     Performed at [facility]   Report Status 11/07/2012 FINAL   Final  CULTURE, BLOOD (ROUTINE X 2)     Status: None   Collection Time    11/03/12 10:35 PM      Result Value Range Status   Specimen Description BLOOD LEFT ARM   Final   Special Requests BOTTLES DRAWN AEROBIC AND ANAEROBIC 10CC EA   Final   Culture  Setup Time     Final   Value: 11/04/2012 04:11     Performed at [facility]   Culture     Final   Value:        BLOOD CULTURE RECEIVED NO GROWTH TO DATE CULTURE WILL BE  HELD FOR 5 DAYS BEFORE ISSUING A FINAL NEGATIVE REPORT     Performed at [facility]   Report Status PENDING   Incomplete  CULTURE, BLOOD (ROUTINE X 2)     Status: None   Collection Time    11/03/12 10:45 PM      Result Value Range Status   Specimen Description BLOOD LEFT HAND   Final   Special Requests BOTTLES DRAWN AEROBIC AND ANAEROBIC 10CC EA   Final   Culture  Setup Time     Final   Value: 11/04/2012 04:11     Performed at [facility]   Culture     Final   Value:        BLOOD CULTURE RECEIVED NO GROWTH TO DATE CULTURE WILL BE HELD FOR 5 DAYS BEFORE ISSUING A FINAL NEGATIVE REPORT     Performed at [facility]   Report Status PENDING   Incomplete    RADIOLOGY STUDIES/RESULTS: US Abdomen Complete  10/30/2012   *RADIOLOGY REPORT*  Clinical Data: Increasing liver enzymes.  ABDOMEN ULTRASOUND  Technique:  Complete abdominal ultrasound examination was performed including evaluation of the liver, gallbladder, bile ducts, pancreas,  kidneys, spleen, IVC, and abdominal aorta.  Comparison: CT scan from 10/28/2012  Findings:  Gallbladder:  Gallbladder wall is thickened at 6 mm and shows intramural edema.  No evidence for pericholecystic fluid. Tumefactive sludge is noted within the lumen but no definite gallstones are evident.  The sonographer reports no sonographic Murphy's sign.  Common Bile Duct:  Nondilated at 3 mm diameter.  Liver:  Echogenic liver suggests fatty infiltration.  Liver is noted to be enlarged on the recent CT scan.  No intrahepatic biliary duct dilatation.  IVC:  Normal.  Pancreas:  Not well seen secondary overlying midline bowel gas.  Spleen:  Normal.  Right kidney:  13.2 cm in long axis.  Normal.  Left kidney:  12.3 cm in long axis.  Normal.  Abdominal Aorta:  No aneurysm is evident.  No evidence for ascites.  IMPRESSION: Gallbladder sludge with gallbladder wall thickening and intramural edema.  No gallstones are evident, but acute cholecystitis cannot be  excluded by imaging.  In the setting of liver disease, gallbladder wall thickening can be nonacute, there is no evidence for associated ascites on the current study.  Nuclear scintigraphy may prove helpful to evaluate for acute cholecystitis as clinically warranted.   Original Report Authenticated By: Kennith Center, M.D.   Ct Abdomen Pelvis W Contrast  10/28/2012   CLINICAL DATA:  Jaundice  EXAM: CT ABDOMEN AND PELVIS WITH CONTRAST  TECHNIQUE: Multidetector CT imaging of the abdomen and pelvis was performed using the standard protocol following bolus administration of intravenous contrast.  CONTRAST:  OMNIPAQUE IOHEXOL 300 MG/ML  SOLN  COMPARISON:  None.  FINDINGS: BODY WALL: Fat containing umbilical hernia  LOWER CHEST:  Mediastinum: Unremarkable.  Lungs/pleura: Elevated right diaphragm with mild basilar atelectasis  ABDOMEN/PELVIS:  Liver: Enlarged liver (25 cm craniocaudal span midclavicular line), with diffuse patchy hypodensity or hypo-enhancement. No focal abnormality or definitive nodular contour. There is a recannulized/enlarged periumbilical vein.  Biliary: No evidence of biliary obstruction or stone.  Pancreas: Unremarkable.  Spleen: Enlarged at 14 cm craniocaudal span. No focal abnormality.  Adrenals: Unremarkable.  Kidneys and ureters: No hydronephrosis or stone.  Bladder: Unremarkable.  Bowel: No obstruction. Normal appendix.  Retroperitoneum: No mass or adenopathy.  Peritoneum: No free fluid or gas.  Reproductive: Unremarkable.  Vascular: No acute abnormality.  OSSEOUS: No acute abnormalities. No suspicious lytic or blastic lesions.  IMPRESSION: 1. Hepatomegaly and heterogeneous liver parenchyma that could represent fatty infiltration or hepatitis. 2. Although no overt cirrhotic change, there is evidence of portal hypertension with splenomegaly and enlarged paraumbilical vein. 3. No evidence of biliary obstruction to explain jaundice.   Electronically Signed   By: Tiburcio Pea   On:  10/28/2012 00:52   Dg Abd 2 Views  10/30/2012   CLINICAL DATA:  Mid abdominal pain and pressure  EXAM: ABDOMEN - 2 VIEW  COMPARISON:  CT abdomen 10/28/2012  FINDINGS: The liver is enlarged similar to comparison CT. There are no dilated loops of large or small bowel. Moderate volume of stool in the ascending colon. Gas in the rectum. No pathologic calcifications.  IMPRESSION: 1. Hepatomegaly.  2. No evidence of bowel obstruction.   Electronically Signed   By: Genevive Bi M.D.   On: 10/30/2012 16:50   Dg Abd Acute W/chest  10/27/2012   CLINICAL DATA:  Fever and constipation. Abdominal pain.  EXAM: ACUTE ABDOMEN SERIES (ABDOMEN 2 VIEW & CHEST 1 VIEW)  COMPARISON:  None.  FINDINGS: Elevated right diaphragm of uncertain cause. Low volume chest  without focal opacity. No cardiomegaly.  Question hepatomegaly given elevated right diaphragm and extension nearly to the pelvic inlet. The stomach appears slightly displaced to the left additionally. Nonobstructive bowel gas pattern. No abnormal intra-abdominal calcification. Negative osseous structures.  IMPRESSION: 1. Nonobstructive bowel gas pattern. No pneumoperitoneum. 2. Elevated right diaphragm, possibly from hepatomegaly. 3. Clear chest.   Electronically Signed   By: Tiburcio Pea   On: 10/27/2012 21:54    Jeoffrey Massed, MD  [facility] Pager:[phone]  If 7PM-7AM, please contact night-coverage [email or web address] Password [email or web address] 11/07/2012, 10:24 AM   LOS: 11 days

## 2012-11-07 NOTE — Progress Notes (Signed)
Interpreter Wyvonnia Dusky for [employer]

## 2012-11-08 LAB — COMPREHENSIVE METABOLIC PANEL
ALT: 73 U/L — ABNORMAL HIGH (ref 0–53)
Albumin: 1.5 g/dL — ABNORMAL LOW (ref 3.5–5.2)
Alkaline Phosphatase: 170 U/L — ABNORMAL HIGH (ref 39–117)
BUN: 10 mg/dL (ref 6–23)
Chloride: 104 mEq/L (ref 96–112)
Glucose, Bld: 86 mg/dL (ref 70–99)
Potassium: 3.4 mEq/L — ABNORMAL LOW (ref 3.5–5.1)
Sodium: 130 mEq/L — ABNORMAL LOW (ref 135–145)
Total Bilirubin: 22 mg/dL (ref 0.3–1.2)

## 2012-11-08 LAB — CBC
HCT: 24.9 % — ABNORMAL LOW (ref 39.0–52.0)
Hemoglobin: 8.9 g/dL — ABNORMAL LOW (ref 13.0–17.0)
MCHC: 35.7 g/dL (ref 30.0–36.0)
RDW: 24.5 % — ABNORMAL HIGH (ref 11.5–15.5)
WBC: 19.6 10*3/uL — ABNORMAL HIGH (ref 4.0–10.5)

## 2012-11-08 LAB — TYPE AND SCREEN
ABO/RH(D): O POS
Antibody Screen: NEGATIVE
Unit division: 0

## 2012-11-08 LAB — PROTIME-INR: Prothrombin Time: 18 seconds — ABNORMAL HIGH (ref 11.6–15.2)

## 2012-11-08 MED ORDER — SODIUM BICARBONATE 650 MG PO TABS: 1300.0000 mg | ORAL_TABLET | Freq: Two times a day (BID) | ORAL | Status: DC

## 2012-11-08 MED ORDER — FUROSEMIDE 20 MG PO TABS: 20.0000 mg | ORAL_TABLET | Freq: Every day | ORAL | Status: DC

## 2012-11-08 MED ORDER — SPIRONOLACTONE 100 MG PO TABS: 100.0000 mg | ORAL_TABLET | Freq: Every day | ORAL | Status: DC

## 2012-11-08 MED ORDER — POTASSIUM CHLORIDE CRYS ER 20 MEQ PO TBCR: 40.0000 meq | EXTENDED_RELEASE_TABLET | Freq: Every day | ORAL | Status: DC

## 2012-11-08 MED ORDER — FUROSEMIDE 20 MG PO TABS
20.0000 mg | ORAL_TABLET | Freq: Every day | ORAL | Status: DC
  Administered 2012-11-08: 20 mg via ORAL
  Filled 2012-11-08: qty 1

## 2012-11-08 MED ORDER — BOOST / RESOURCE BREEZE PO LIQD: 1.0000 | Freq: Three times a day (TID) | ORAL | Status: DC

## 2012-11-08 MED ORDER — SODIUM BICARBONATE 650 MG PO TABS
1300.0000 mg | ORAL_TABLET | Freq: Two times a day (BID) | ORAL | Status: DC
  Filled 2012-11-08: qty 2

## 2012-11-08 MED ORDER — HYDROCORTISONE ACETATE 25 MG RE SUPP: 25.0000 mg | Freq: Two times a day (BID) | RECTAL | Status: DC | PRN

## 2012-11-08 MED ORDER — PROPRANOLOL HCL 10 MG PO TABS: 10.0000 mg | ORAL_TABLET | Freq: Two times a day (BID) | ORAL | Status: DC

## 2012-11-08 MED ORDER — CIPROFLOXACIN HCL 500 MG PO TABS
500.0000 mg | ORAL_TABLET | Freq: Two times a day (BID) | ORAL | Status: DC
  Administered 2012-11-08: 500 mg via ORAL
  Filled 2012-11-08 (×3): qty 1

## 2012-11-08 MED ORDER — THIAMINE HCL 100 MG PO TABS: 100.0000 mg | ORAL_TABLET | Freq: Every day | ORAL | Status: DC

## 2012-11-08 MED ORDER — OMEPRAZOLE 40 MG PO CPDR: 40.0000 mg | DELAYED_RELEASE_CAPSULE | Freq: Every day | ORAL | Status: DC

## 2012-11-08 MED ORDER — ADULT MULTIVITAMIN W/MINERALS CH: 1.0000 | ORAL_TABLET | Freq: Every day | ORAL | Status: DC

## 2012-11-08 MED ORDER — FOLIC ACID 1 MG PO TABS: 1.0000 mg | ORAL_TABLET | Freq: Every day | ORAL | Status: DC

## 2012-11-08 MED ORDER — DSS 100 MG PO CAPS: 200.0000 mg | ORAL_CAPSULE | Freq: Two times a day (BID) | ORAL | Status: DC

## 2012-11-08 MED ORDER — POLYETHYLENE GLYCOL 3350 17 G PO PACK: 17.0000 g | PACK | Freq: Two times a day (BID) | ORAL | Status: DC

## 2012-11-08 MED ORDER — CIPROFLOXACIN HCL 500 MG PO TABS: 500.0000 mg | ORAL_TABLET | Freq: Two times a day (BID) | ORAL | Status: DC

## 2012-11-08 NOTE — Progress Notes (Signed)
NUTRITION FOLLOW UP  Intervention:   Resource Breeze po TID, each supplement provides 250 kcal and 9 grams of protein.   NUTRITION DIAGNOSIS:  Unintentional weight loss related to alcohol abuse as evidenced by 6% weight loss x 2 weeks; ongoing.   Goal:  Pt to meet >/= 90% of their estimated nutrition needs, met.   Monitor:  PO intake, supplement acceptance, labs, weight trend   Assessment:   Pt admitted with Jaundice with work up in progress. Pt with hx of ETOH abuse, per hx drinks 6-12 pack beer everyday.  Per RN pt is drinking Nurse, adult supplements. Pt eating most of his meals now. Potassium remains low. Pt remains on MVI, thiamine, and folic acid.   Height: Ht Readings from Last 1 Encounters:  10/28/12 5\' 7"  (1.702 m)    Weight Status:   Wt Readings from Last 1 Encounters:  11/08/12 175 lb 11.2 oz (79.697 kg)  Admission weight 163 lb   Re-estimated needs:  Kcal: 2050-2250  Protein: 100-115 grams  Fluid: >2 L/day  Skin: no issues noted  Diet Order: General Meal Completion: 75-100%   Intake/Output Summary (Last 24 hours) at 11/08/12 0959 Last data filed at 11/08/12 0650  Gross per 24 hour  Intake   1325 ml  Output      0 ml  Net   1325 ml    Last BM: 9/25   Labs:   Recent Labs Lab 11/05/12 0505  11/06/12 0940 11/07/12 0945 11/08/12 0500  NA 130*  < > 134* 131* 130*  K 2.7*  < > 3.2* 3.2* 3.4*  CL 100  < > 106 104 104  CO2 18*  < > 18* 17* 15*  BUN 12  < > 11 10 10   CREATININE 0.65  < > 0.68 0.69 0.64  CALCIUM 7.4*  < > 7.4* 7.5* 7.8*  MG 1.8  --   --   --   --   GLUCOSE 109*  < > 108* 98 86  < > = values in this interval not displayed.  CBG (last 3)  No results found for this basename: GLUCAP,  in the last 72 hours  Scheduled Meds: . docusate sodium  200 mg Oral BID  . feeding supplement  1 Container Oral TID BM  . folic acid  1 mg Oral Daily  . hydrocortisone  25 mg Rectal BID  . imipenem-cilastatin  500 mg Intravenous Q6H  .  multivitamin with minerals  1 tablet Oral Daily  . pantoprazole  40 mg Oral Daily  . polyethylene glycol  17 g Oral BID  . potassium chloride  40 mEq Oral Daily  . propranolol  10 mg Oral BID  . sodium bicarbonate  650 mg Oral BID  . sodium chloride  3 mL Intravenous Q12H  . spironolactone  100 mg Oral Daily  . thiamine  100 mg Oral Daily    Continuous Infusions:    Kendell Bane RD, LDN, CNSC [phone] Pager [phone] After Hours Pager

## 2012-11-08 NOTE — Discharge Summary (Addendum)
PATIENT DETAILS Name: Danny Arnold Age: 37 y.o. Sex: male Date of Birth: 11/14/1975 MRN: [id number]. Admit Date: 10/27/2012 Admitting Physician: Eduard Clos, MD PCP:No PCP Per Patient  Recommendations for Outpatient Follow-up:  1. Please check LTF's, Lytes, INR, CBC next visit. 2. Needs ongoing ETOH cessation counseling 3. Please uptitrate Lasix dosing-depending on BP and electrolytes. Discharge weight 79.6 kg.  PRIMARY DISCHARGE DIAGNOSIS:  Principal Problem:   Jaundice Active Problems:   Alcoholic Hepatitis    Citrobacter Bacteremia   Hyponatremia   Anemia   Leucocytosis     PAST MEDICAL HISTORY: ETOH use  DISCHARGE MEDICATIONS:   Medication List    STOP taking these medications       LAXATIVE PO      TAKE these medications       ciprofloxacin 500 MG tablet  Commonly known as:  CIPRO  Take 1 tablet (500 mg total) by mouth 2 (two) times daily.     DSS 100 MG Caps  Take 200 mg by mouth 2 (two) times daily.     feeding supplement Liqd  Take 1 Container by mouth 3 (three) times daily between meals.     folic acid 1 MG tablet  Commonly known as:  FOLVITE  Take 1 tablet (1 mg total) by mouth daily.     furosemide 20 MG tablet  Commonly known as:  LASIX  Take 1 tablet (20 mg total) by mouth daily.     hydrocortisone 25 MG suppository  Commonly known as:  ANUSOL-HC  Place 1 suppository (25 mg total) rectally 2 (two) times daily as needed for hemorrhoids.     multivitamin with minerals Tabs tablet  Take 1 tablet by mouth daily.     omeprazole 40 MG capsule  Commonly known as:  PRILOSEC  Take 1 capsule (40 mg total) by mouth daily.     polyethylene glycol packet  Commonly known as:  MIRALAX / GLYCOLAX  Take 17 g by mouth 2 (two) times daily.     potassium chloride SA 20 MEQ tablet  Commonly known as:  K-DUR,KLOR-CON  Take 2 tablets (40 mEq total) by mouth daily.     propranolol 10 MG tablet  Commonly known as:  INDERAL  Take 1 tablet (10  mg total) by mouth 2 (two) times daily.     sodium bicarbonate 650 MG tablet  Take 2 tablets (1,300 mg total) by mouth 2 (two) times daily.     spironolactone 100 MG tablet  Commonly known as:  ALDACTONE  Take 1 tablet (100 mg total) by mouth daily.     thiamine 100 MG tablet  Take 1 tablet (100 mg total) by mouth daily.        ALLERGIES:  No Known Allergies  BRIEF HPI:  See H&P, Labs, Consult and Test reports for all details in brief, Danny Arnold is a 37 y.o. male with no significant past medical history who drinks alcohol every day except for the last 4 days presented to the ER as patient noticed himself becoming yellowish. Patient also has been having constipation and nausea. He has abdominal pain when he moves his bowels. In the ER patient was found to be jaundiced with blood work showing bilirubin around 40 with CT abdomen and pelvis done with contrast showing signs of portal hypertension and hepatomegaly. Patient states he drinks alcohol everyday except for last 4 days and denies having used any IV drugs. Patient states he does not take any medications  CONSULTATIONS:  ID and GI  PERTINENT RADIOLOGIC STUDIES: US Abdomen Complete  10/30/2012   *RADIOLOGY REPORT*  Clinical Data: Increasing liver enzymes.  ABDOMEN ULTRASOUND  Technique:  Complete abdominal ultrasound examination was performed including evaluation of the liver, gallbladder, bile ducts, pancreas, kidneys, spleen, IVC, and abdominal aorta.  Comparison: CT scan from 10/28/2012  Findings:  Gallbladder:  Gallbladder wall is thickened at 6 mm and shows intramural edema.  No evidence for pericholecystic fluid. Tumefactive sludge is noted within the lumen but no definite gallstones are evident.  The sonographer reports no sonographic Murphy's sign.  Common Bile Duct:  Nondilated at 3 mm diameter.  Liver:  Echogenic liver suggests fatty infiltration.  Liver is noted to be enlarged on the recent CT scan.  No intrahepatic  biliary duct dilatation.  IVC:  Normal.  Pancreas:  Not well seen secondary overlying midline bowel gas.  Spleen:  Normal.  Right kidney:  13.2 cm in long axis.  Normal.  Left kidney:  12.3 cm in long axis.  Normal.  Abdominal Aorta:  No aneurysm is evident.  No evidence for ascites.  IMPRESSION: Gallbladder sludge with gallbladder wall thickening and intramural edema.  No gallstones are evident, but acute cholecystitis cannot be excluded by imaging.  In the setting of liver disease, gallbladder wall thickening can be nonacute, there is no evidence for associated ascites on the current study.  Nuclear scintigraphy may prove helpful to evaluate for acute cholecystitis as clinically warranted.   Original Report Authenticated By: Kennith Center, M.D.   Ct Abdomen Pelvis W Contrast  10/28/2012   CLINICAL DATA:  Jaundice  EXAM: CT ABDOMEN AND PELVIS WITH CONTRAST  TECHNIQUE: Multidetector CT imaging of the abdomen and pelvis was performed using the standard protocol following bolus administration of intravenous contrast.  CONTRAST:  OMNIPAQUE IOHEXOL 300 MG/ML  SOLN  COMPARISON:  None.  FINDINGS: BODY WALL: Fat containing umbilical hernia  LOWER CHEST:  Mediastinum: Unremarkable.  Lungs/pleura: Elevated right diaphragm with mild basilar atelectasis  ABDOMEN/PELVIS:  Liver: Enlarged liver (25 cm craniocaudal span midclavicular line), with diffuse patchy hypodensity or hypo-enhancement. No focal abnormality or definitive nodular contour. There is a recannulized/enlarged periumbilical vein.  Biliary: No evidence of biliary obstruction or stone.  Pancreas: Unremarkable.  Spleen: Enlarged at 14 cm craniocaudal span. No focal abnormality.  Adrenals: Unremarkable.  Kidneys and ureters: No hydronephrosis or stone.  Bladder: Unremarkable.  Bowel: No obstruction. Normal appendix.  Retroperitoneum: No mass or adenopathy.  Peritoneum: No free fluid or gas.  Reproductive: Unremarkable.  Vascular: No acute abnormality.   OSSEOUS: No acute abnormalities. No suspicious lytic or blastic lesions.  IMPRESSION: 1. Hepatomegaly and heterogeneous liver parenchyma that could represent fatty infiltration or hepatitis. 2. Although no overt cirrhotic change, there is evidence of portal hypertension with splenomegaly and enlarged paraumbilical vein. 3. No evidence of biliary obstruction to explain jaundice.   Electronically Signed   By: Tiburcio Pea   On: 10/28/2012 00:52   Dg Abd 2 Views  10/30/2012   CLINICAL DATA:  Mid abdominal pain and pressure  EXAM: ABDOMEN - 2 VIEW  COMPARISON:  CT abdomen 10/28/2012  FINDINGS: The liver is enlarged similar to comparison CT. There are no dilated loops of large or small bowel. Moderate volume of stool in the ascending colon. Gas in the rectum. No pathologic calcifications.  IMPRESSION: 1. Hepatomegaly.  2. No evidence of bowel obstruction.   Electronically Signed   By: Genevive Bi M.D.   On: 10/30/2012 16:50  Dg Abd Acute W/chest  10/27/2012   CLINICAL DATA:  Fever and constipation. Abdominal pain.  EXAM: ACUTE ABDOMEN SERIES (ABDOMEN 2 VIEW & CHEST 1 VIEW)  COMPARISON:  None.  FINDINGS: Elevated right diaphragm of uncertain cause. Low volume chest without focal opacity. No cardiomegaly.  Question hepatomegaly given elevated right diaphragm and extension nearly to the pelvic inlet. The stomach appears slightly displaced to the left additionally. Nonobstructive bowel gas pattern. No abnormal intra-abdominal calcification. Negative osseous structures.  IMPRESSION: 1. Nonobstructive bowel gas pattern. No pneumoperitoneum. 2. Elevated right diaphragm, possibly from hepatomegaly. 3. Clear chest.   Electronically Signed   By: Tiburcio Pea   On: 10/27/2012 21:54     PERTINENT LAB RESULTS: CBC:  Recent Labs  11/07/12 0945 11/08/12 0500  WBC 18.0* 19.6*  HGB 6.7* 8.9*  HCT 18.9* 24.9*  PLT 197 235   CMET CMP     Component Value Date/Time   NA 130* 11/08/2012 0500   K 3.4*  11/08/2012 0500   CL 104 11/08/2012 0500   CO2 15* 11/08/2012 0500   GLUCOSE 86 11/08/2012 0500   BUN 10 11/08/2012 0500   CREATININE 0.64 11/08/2012 0500   CALCIUM 7.8* 11/08/2012 0500   PROT 6.3 11/08/2012 0500   ALBUMIN 1.5* 11/08/2012 0500   AST 166* 11/08/2012 0500   ALT 73* 11/08/2012 0500   ALKPHOS 170* 11/08/2012 0500   BILITOT 22.0* 11/08/2012 0500   GFRNONAA >90 11/08/2012 0500   GFRAA >90 11/08/2012 0500    GFR Estimated Creatinine Clearance: 130.3 ml/min (by C-G formula based on Cr of 0.64). No results found for this basename: LIPASE, AMYLASE,  in the last 72 hours No results found for this basename: CKTOTAL, CKMB, CKMBINDEX, TROPONINI,  in the last 72 hours No components found with this basename: POCBNP,  No results found for this basename: DDIMER,  in the last 72 hours No results found for this basename: HGBA1C,  in the last 72 hours No results found for this basename: CHOL, HDL, LDLCALC, TRIG, CHOLHDL, LDLDIRECT,  in the last 72 hours No results found for this basename: TSH, T4TOTAL, FREET3, T3FREE, THYROIDAB,  in the last 72 hours No results found for this basename: VITAMINB12, FOLATE, FERRITIN, TIBC, IRON, RETICCTPCT,  in the last 72 hours Coags:  Recent Labs  11/08/12 1400  INR 1.53*   Microbiology: Recent Results (from the past 240 hour(s))  CULTURE, BLOOD (ROUTINE X 2)     Status: None   Collection Time    10/30/12  3:40 PM      Result Value Range Status   Specimen Description BLOOD BLOOD RIGHT FOREARM   Final   Special Requests BOTTLES DRAWN AEROBIC AND ANAEROBIC 10CC EACH   Final   Culture  Setup Time     Final   Value: 10/30/2012 22:08     Performed at [facility]   Culture     Final   Value: CITROBACTER FREUNDII     Note: Gram Stain Report Called to,Read Back By and Verified With: MARGARET CASSEL 10/31/12 1105 BY SMITHERSJ     Performed at [facility]   Report Status 11/02/2012 FINAL   Final   Organism ID, Bacteria CITROBACTER FREUNDII    Final  CULTURE, BLOOD (ROUTINE X 2)     Status: None   Collection Time    10/31/12 11:55 AM      Result Value Range Status   Specimen Description BLOOD RIGHT ARM   Final   Special Requests  BOTTLES DRAWN AEROBIC AND ANAEROBIC 10CC   Final   Culture  Setup Time     Final   Value: 11/01/2012 07:19     Performed at [facility]   Culture     Final   Value: NO GROWTH 5 DAYS     Performed at [facility]   Report Status 11/07/2012 FINAL   Final  CULTURE, BLOOD (ROUTINE X 2)     Status: None   Collection Time    11/01/12 12:55 AM      Result Value Range Status   Specimen Description BLOOD LEFT ARM   Final   Special Requests BOTTLES DRAWN AEROBIC ONLY 10CC   Final   Culture  Setup Time     Final   Value: 11/01/2012 09:47     Performed at [facility]   Culture     Final   Value: NO GROWTH 5 DAYS     Performed at [facility]   Report Status 11/07/2012 FINAL   Final  CULTURE, BLOOD (ROUTINE X 2)     Status: None   Collection Time    11/03/12 10:35 PM      Result Value Range Status   Specimen Description BLOOD LEFT ARM   Final   Special Requests BOTTLES DRAWN AEROBIC AND ANAEROBIC 10CC EA   Final   Culture  Setup Time     Final   Value: 11/04/2012 04:11     Performed at [facility]   Culture     Final   Value:        BLOOD CULTURE RECEIVED NO GROWTH TO DATE CULTURE WILL BE HELD FOR 5 DAYS BEFORE ISSUING A FINAL NEGATIVE REPORT     Performed at [facility]   Report Status PENDING   Incomplete  CULTURE, BLOOD (ROUTINE X 2)     Status: None   Collection Time    11/03/12 10:45 PM      Result Value Range Status   Specimen Description BLOOD LEFT HAND   Final   Special Requests BOTTLES DRAWN AEROBIC AND ANAEROBIC 10CC EA   Final   Culture  Setup Time     Final   Value: 11/04/2012 04:11     Performed at [facility]   Culture     Final   Value:        BLOOD CULTURE RECEIVED NO GROWTH TO DATE CULTURE WILL BE HELD FOR 5 DAYS  BEFORE ISSUING A FINAL NEGATIVE REPORT     Performed at [facility]   Report Status PENDING   Incomplete     BRIEF HOSPITAL COURSE:  Jaundice  -secondary alcoholic hepatitis  -viral hepatitis serology negative  -downtrending bilirubin-seems to have plateaued from a peak of 40. On discharge, down to 22.0, despite significant hyperbilirubinemia-patient has been stable, ambulating and eating a regular diet. He has been requesting discharge. -Has been educated extensively to avoid hepatotoxic meds, and to avoid further ETOH  -suspect hyperbilirubinemia will takes weeks/months to clear, and seems to have plateaued, will need close outpatient follow up. Infact outpatient follow up at the [facility] center has been arranged for 10/2-where LFT's can be checked.He will require close and very frequent follow visits. -family conference with interpretor done prior to discharge-patient has been repeatedly counseled and warned of further ETOH use. He also knows to avoid Tylenol for now. He also is aware that he should consult his PCP before starting any new medications-OTC or  prescribed  Citrobacter Bacteremia -blood culture this admit was positive for Citrobacter-he was on IV Primaxin, will be transitioned to Ciprofloxacin on discharge, total duration of antibiotics in 14 days from 11/03/12. -remains afebrile and non toxic looking. Leukocytosis is likely from Hepatitis.  Anemia  -secondary to critical illness,?blood loss from presumed Hemorrhoids  -transfused 2 units PRBC on 9/25, Hb appropriately at 8.9 today  -monitor Hb at next visit with PCP  Rectal Bleeding  -?hemorrhoids-seems to have resolved, no hematochezia for the past 3 days  -c/w Anusol suppository  -Miralax BID, patient has been instructed to keep stools soft -GI was consulted this admit, if further drop in Hb or rectal bleeding recurs-may need GI eval at that point again.  Non Gapped Metabolic Acidosis -not clear what the  exact etiology is, was on IV Bicarbonate infusion,-but with volume overload/edema-this was stopped, and was started on oral Bicarbonate, this was increased to 1300mg  BID on discharge. Please follow lytes at next visit with PCP  Edema  -2/2 IVF, Hypoalbuminemia, ?liver cirrhoses  -start Aldactone given portal HTN/Spleenomegaly on CT Abd-increase to 100 mg daily, and start 20 mg lasix today, will slowly uptitrate  Hyponatremia  -2/2 to fluid overload  -on Aldactone/Lasix -monitor lytes at next visit with PCP  Hypokalemia  -continue with KCL on discharge, on Aldactone/Lasix, monitor  Portal HTN/Spleenomegaly  -now on Aldactone-should help with edema and hypokalemia  -start low dose propranolol-BP seems to be tolerating this-uptitrate slowly   TODAY-DAY OF DISCHARGE:  Subjective:   Danny Arnold today has no headache,no chest abdominal pain,no new weakness tingling or numbness, feels much better wants to go home today. No further rectal bleeding for close to 3 days, ambulating in the hallway  Objective:   Blood pressure 107/59, pulse 86, temperature 98.3 F (36.8 C), temperature source Oral, resp. rate 16, height 5\' 7"  (1.702 m), weight 79.697 kg (175 lb 11.2 oz), SpO2 100.00%.  Intake/Output Summary (Last 24 hours) at 11/08/12 1538 Last data filed at 11/08/12 0650  Gross per 24 hour  Intake 1312.5 ml  Output      0 ml  Net 1312.5 ml   Filed Weights   11/06/12 0500 11/07/12 0500 11/08/12 0500  Weight: 80.876 kg (178 lb 4.8 oz) 78.725 kg (173 lb 8.9 oz) 79.697 kg (175 lb 11.2 oz)    Exam Awake Alert, Oriented *3, No new F.N deficits, Normal affect. Icterus+ [place].AT,PERRAL Supple Neck,No JVD, No cervical lymphadenopathy appriciated.  Symmetrical Chest wall movement, Good air movement bilaterally, CTAB RRR,No Gallops,Rubs or new Murmurs, No Parasternal Heave +ve B.Sounds, Abd Soft, Non tender, No organomegaly appriciated, No rebound -guarding or rigidity. No Cyanosis,  Clubbing or 2+ edema, No new Rash or bruise  DISCHARGE CONDITION: Stable  DISPOSITION: Home  DISCHARGE INSTRUCTIONS:    Activity:  As tolerated   Diet recommendation: Regular Diet        Future Appointments Provider Department Dept Phone   11/14/2012 2:30 PM [facility] Covering Provider [facility] Joan Flores [phone]      Follow-up Information   Follow up with [facility] On 11/14/2012. (appointment at 2:30 pm)    Contact information:   [address] [place] [address] [phone]     Total Time spent on discharge equals 45 minutes.  SignedJeoffrey Massed 11/08/2012 3:38 PM

## 2012-11-08 NOTE — Progress Notes (Signed)
Patient discharged to home accompanied by family. Discharge instructions and rx given and explained with an interpretor present. All questions were answered. Patient stated understanding. IV was removed and patient left unit in a stable condition via wheelchair.

## 2012-11-08 NOTE — Progress Notes (Signed)
Interpreter Wyvonnia Dusky for [employer] RN

## 2012-11-08 NOTE — Progress Notes (Addendum)
PATIENT DETAILS Name: Danny Arnold Age: 37 y.o. Sex: male Date of Birth: [birth year] Admit Date: 10/27/2012 Admitting Physician Eduard Clos, MD PCP:No PCP Per Patient  Subjective: No major issues- No further rectal bleeding. Main complaint is Leg edema. Ambulating in room. He is requesting discharge-awaiting family conference prior to discharge planning  Assessment/Plan: Principal Problem:   Jaundice -secondary alcoholic hepatitis -viral serology negative -downtrending bilirubin-seems to have plateaued -avoid hepatotoxic meds, avoid further ETOH -suspect hyperbilirubinemia will takes weeks/months to clear, and seems to have plateaued, will need close outpatient follow up. Infact outpatient follow up at the [facility] center has been arranged for 10/2-where LFT's can be checked. Await family conference prior to discharge -Also curbsided Dr Irena Cords did also indicated-that sometimes jaundice persists for weeks, and patient would just need close monitoring and avoidance of further hepato-toxic meds/ETOH  Active Problems: Citrobacter Bacteremia -c/w Primaxin-transition to Cipro from today  Alcoholic Hepatitis -as above  Anemia -secondary to critical illness,?blood loss from presumed Hemorrhoids -transfused 2 units PRBC on 9/25, Hb appropriately at 8.9 today -monitor Hb  Rectal Bleeding -?hemorrhoids-seems to have resolved, no hematochezia for the past 3 days -c/w Anusol suppository -Miralax BID  Non Gapped Metabolic Acidosis -stop bicarb gtt-change to oral bicarb-increase to 1300 mg BID  Edema -2/2 IVF, Hypoalbuminemia, ?liver cirrhoses  -start Aldactone given portal HTN/Spleenomegaly on CT Abd-increase to 100 mg daily, and start 20 mg lasix today, will slowly uptitrate    Hyponatremia -2/2 to fluid overload -stop IVF-on Aldactone/Lasix  Hypokalemia -decrease KCL as on Aldactone, monitor  Portal HTN/Spleenomegaly -now on Aldactone-should help with  edema and hypokalemia -start low dose propranolol-BP seems to be tolerating this -follow BP  Disposition: Remain inpatient-will discuss with family before discharge. I have scheduled him a follow up at the [facility] for 10/2-where he can have a repeat LFT done.  DVT Prophylaxis:  SCD's  Code Status: Full code   Family Communication None at bedside  Procedures:  None  CONSULTS:  GI   MEDICATIONS: Scheduled Meds: . docusate sodium  200 mg Oral BID  . feeding supplement  1 Container Oral TID BM  . folic acid  1 mg Oral Daily  . furosemide  20 mg Oral Daily  . hydrocortisone  25 mg Rectal BID  . imipenem-cilastatin  500 mg Intravenous Q6H  . multivitamin with minerals  1 tablet Oral Daily  . pantoprazole  40 mg Oral Daily  . polyethylene glycol  17 g Oral BID  . potassium chloride  40 mEq Oral Daily  . propranolol  10 mg Oral BID  . sodium bicarbonate  650 mg Oral BID  . sodium chloride  3 mL Intravenous Q12H  . spironolactone  100 mg Oral Daily  . thiamine  100 mg Oral Daily   Continuous Infusions:  PRN Meds:.ondansetron (ZOFRAN) IV, ondansetron  Antibiotics: Anti-infectives   Start     Dose/Rate Route Frequency Ordered Stop   11/02/12 1200  imipenem-cilastatin (PRIMAXIN) 500 mg in sodium chloride 0.9 % 100 mL IVPB     500 mg 200 mL/hr over 30 Minutes Intravenous 4 times per day 11/02/12 0936     11/01/12 1430  ceFEPIme (MAXIPIME) 2 g in dextrose 5 % 50 mL IVPB  Status:  Discontinued     2 g 100 mL/hr over 30 Minutes Intravenous 3 times per day 11/01/12 1422 11/02/12 0857   10/28/12 0600  cefoTAXime (CLAFORAN) 1 g in dextrose 5 % 50 mL IVPB  Status:  Discontinued  1 g 100 mL/hr over 30 Minutes Intravenous 3 times per day 10/28/12 0454 11/01/12 1422       PHYSICAL EXAM: Vital signs in last 24 hours: Filed Vitals:   11/07/12 1920 11/07/12 2308 11/08/12 0457 11/08/12 0500  BP: 111/64 118/65 107/59   Pulse: 87 80 86   Temp: 98.7 F (37.1 C)  98.9 F (37.2 C) 98.3 F (36.8 C)   TempSrc: Oral Oral Oral   Resp: 18 16 16    Height:      Weight:    79.697 kg (175 lb 11.2 oz)  SpO2: 100% 99% 100%     Weight change: 0.972 kg (2 lb 2.3 oz) Filed Weights   11/06/12 0500 11/07/12 0500 11/08/12 0500  Weight: 80.876 kg (178 lb 4.8 oz) 78.725 kg (173 lb 8.9 oz) 79.697 kg (175 lb 11.2 oz)   Body mass index is 27.51 kg/(m^2).   Gen Exam: Awake and alert with clear speech.  +icterus Neck: Supple, No JVD.   Chest: B/L Clear.   CVS: S1 S2 Regular, no murmurs.  Abdomen: soft, BS +, non tender, non distended.  Extremities: 2+ edema, lower extremities warm to touch. Neurologic: Non Focal.   Skin: No Rash.   Wounds: N/A.    Intake/Output from previous day:  Intake/Output Summary (Last 24 hours) at 11/08/12 1238 Last data filed at 11/08/12 0650  Gross per 24 hour  Intake 1312.5 ml  Output      0 ml  Net 1312.5 ml     LAB RESULTS: CBC  Recent Labs Lab 11/02/12 0500 11/05/12 0505 11/05/12 2245 11/06/12 1746 11/07/12 0945 11/08/12 0500  WBC 17.3* 18.5*  --   --  18.0* 19.6*  HGB 8.8* 7.3* 7.2* 7.1* 6.7* 8.9*  HCT 24.3* 20.5* 19.7* 20.0* 18.9* 24.9*  PLT 200 137*  --   --  197 235  MCV 98.4 99.0  --   --  100.0 94.7  MCH 35.6* 35.3*  --   --  35.4* 33.8  MCHC 36.2* 35.6  --   --  35.4 35.7  RDW 24.2* 23.0*  --   --  22.9* 24.5*    Chemistries   Recent Labs Lab 11/05/12 0505 11/05/12 1707 11/06/12 0940 11/07/12 0945 11/08/12 0500  NA 130* 132* 134* 131* 130*  K 2.7* 3.5 3.2* 3.2* 3.4*  CL 100 102 106 104 104  CO2 18* 18* 18* 17* 15*  GLUCOSE 109* 126* 108* 98 86  BUN 12 12 11 10 10   CREATININE 0.65 0.72 0.68 0.69 0.64  CALCIUM 7.4* 7.4* 7.4* 7.5* 7.8*  MG 1.8  --   --   --   --     CBG:  Recent Labs Lab 11/03/12 1640 11/03/12 2008 11/03/12 2359 11/04/12 0407 11/04/12 0749  GLUCAP 98 130* 107* 95 117*    GFR Estimated Creatinine Clearance: 130.3 ml/min (by C-G formula based on Cr of  0.64).  Coagulation profile No results found for this basename: INR, PROTIME,  in the last 168 hours  Cardiac Enzymes No results found for this basename: CK, CKMB, TROPONINI, MYOGLOBIN,  in the last 168 hours  No components found with this basename: POCBNP,  No results found for this basename: DDIMER,  in the last 72 hours No results found for this basename: HGBA1C,  in the last 72 hours No results found for this basename: CHOL, HDL, LDLCALC, TRIG, CHOLHDL, LDLDIRECT,  in the last 72 hours No results found for this basename: TSH, T4TOTAL, FREET3, T3FREE,  THYROIDAB,  in the last 72 hours No results found for this basename: VITAMINB12, FOLATE, FERRITIN, TIBC, IRON, RETICCTPCT,  in the last 72 hours No results found for this basename: LIPASE, AMYLASE,  in the last 72 hours  Urine Studies No results found for this basename: UACOL, UAPR, USPG, UPH, UTP, UGL, UKET, UBIL, UHGB, UNIT, UROB, ULEU, UEPI, UWBC, URBC, UBAC, CAST, CRYS, UCOM, BILUA,  in the last 72 hours  MICROBIOLOGY: Recent Results (from the past 240 hour(s))  CULTURE, BLOOD (ROUTINE X 2)     Status: None   Collection Time    10/30/12  3:40 PM      Result Value Range Status   Specimen Description BLOOD BLOOD RIGHT FOREARM   Final   Special Requests BOTTLES DRAWN AEROBIC AND ANAEROBIC 10CC EACH   Final   Culture  Setup Time     Final   Value: 10/30/2012 22:08     Performed at [facility]   Culture     Final   Value: CITROBACTER FREUNDII     Note: Gram Stain Report Called to,Read Back By and Verified With: MARGARET CASSEL 10/31/12 1105 BY SMITHERSJ     Performed at [facility]   Report Status 11/02/2012 FINAL   Final   Organism ID, Bacteria CITROBACTER FREUNDII   Final  CULTURE, BLOOD (ROUTINE X 2)     Status: None   Collection Time    10/31/12 11:55 AM      Result Value Range Status   Specimen Description BLOOD RIGHT ARM   Final   Special Requests BOTTLES DRAWN AEROBIC AND ANAEROBIC 10CC   Final    Culture  Setup Time     Final   Value: 11/01/2012 07:19     Performed at [facility]   Culture     Final   Value: NO GROWTH 5 DAYS     Performed at [facility]   Report Status 11/07/2012 FINAL   Final  CULTURE, BLOOD (ROUTINE X 2)     Status: None   Collection Time    11/01/12 12:55 AM      Result Value Range Status   Specimen Description BLOOD LEFT ARM   Final   Special Requests BOTTLES DRAWN AEROBIC ONLY 10CC   Final   Culture  Setup Time     Final   Value: 11/01/2012 09:47     Performed at [facility]   Culture     Final   Value: NO GROWTH 5 DAYS     Performed at [facility]   Report Status 11/07/2012 FINAL   Final  CULTURE, BLOOD (ROUTINE X 2)     Status: None   Collection Time    11/03/12 10:35 PM      Result Value Range Status   Specimen Description BLOOD LEFT ARM   Final   Special Requests BOTTLES DRAWN AEROBIC AND ANAEROBIC 10CC EA   Final   Culture  Setup Time     Final   Value: 11/04/2012 04:11     Performed at [facility]   Culture     Final   Value:        BLOOD CULTURE RECEIVED NO GROWTH TO DATE CULTURE WILL BE HELD FOR 5 DAYS BEFORE ISSUING A FINAL NEGATIVE REPORT     Performed at [facility]   Report Status PENDING   Incomplete  CULTURE, BLOOD (ROUTINE X 2)     Status: None  Collection Time    11/03/12 10:45 PM      Result Value Range Status   Specimen Description BLOOD LEFT HAND   Final   Special Requests BOTTLES DRAWN AEROBIC AND ANAEROBIC 10CC EA   Final   Culture  Setup Time     Final   Value: 11/04/2012 04:11     Performed at [facility]   Culture     Final   Value:        BLOOD CULTURE RECEIVED NO GROWTH TO DATE CULTURE WILL BE HELD FOR 5 DAYS BEFORE ISSUING A FINAL NEGATIVE REPORT     Performed at [facility]   Report Status PENDING   Incomplete    RADIOLOGY STUDIES/RESULTS: US Abdomen Complete  10/30/2012   *RADIOLOGY REPORT*  Clinical Data: Increasing liver enzymes.   ABDOMEN ULTRASOUND  Technique:  Complete abdominal ultrasound examination was performed including evaluation of the liver, gallbladder, bile ducts, pancreas, kidneys, spleen, IVC, and abdominal aorta.  Comparison: CT scan from 10/28/2012  Findings:  Gallbladder:  Gallbladder wall is thickened at 6 mm and shows intramural edema.  No evidence for pericholecystic fluid. Tumefactive sludge is noted within the lumen but no definite gallstones are evident.  The sonographer reports no sonographic Murphy's sign.  Common Bile Duct:  Nondilated at 3 mm diameter.  Liver:  Echogenic liver suggests fatty infiltration.  Liver is noted to be enlarged on the recent CT scan.  No intrahepatic biliary duct dilatation.  IVC:  Normal.  Pancreas:  Not well seen secondary overlying midline bowel gas.  Spleen:  Normal.  Right kidney:  13.2 cm in long axis.  Normal.  Left kidney:  12.3 cm in long axis.  Normal.  Abdominal Aorta:  No aneurysm is evident.  No evidence for ascites.  IMPRESSION: Gallbladder sludge with gallbladder wall thickening and intramural edema.  No gallstones are evident, but acute cholecystitis cannot be excluded by imaging.  In the setting of liver disease, gallbladder wall thickening can be nonacute, there is no evidence for associated ascites on the current study.  Nuclear scintigraphy may prove helpful to evaluate for acute cholecystitis as clinically warranted.   Original Report Authenticated By: Kennith Center, M.D.   Ct Abdomen Pelvis W Contrast  10/28/2012   CLINICAL DATA:  Jaundice  EXAM: CT ABDOMEN AND PELVIS WITH CONTRAST  TECHNIQUE: Multidetector CT imaging of the abdomen and pelvis was performed using the standard protocol following bolus administration of intravenous contrast.  CONTRAST:  OMNIPAQUE IOHEXOL 300 MG/ML  SOLN  COMPARISON:  None.  FINDINGS: BODY WALL: Fat containing umbilical hernia  LOWER CHEST:  Mediastinum: Unremarkable.  Lungs/pleura: Elevated right diaphragm with mild basilar  atelectasis  ABDOMEN/PELVIS:  Liver: Enlarged liver (25 cm craniocaudal span midclavicular line), with diffuse patchy hypodensity or hypo-enhancement. No focal abnormality or definitive nodular contour. There is a recannulized/enlarged periumbilical vein.  Biliary: No evidence of biliary obstruction or stone.  Pancreas: Unremarkable.  Spleen: Enlarged at 14 cm craniocaudal span. No focal abnormality.  Adrenals: Unremarkable.  Kidneys and ureters: No hydronephrosis or stone.  Bladder: Unremarkable.  Bowel: No obstruction. Normal appendix.  Retroperitoneum: No mass or adenopathy.  Peritoneum: No free fluid or gas.  Reproductive: Unremarkable.  Vascular: No acute abnormality.  OSSEOUS: No acute abnormalities. No suspicious lytic or blastic lesions.  IMPRESSION: 1. Hepatomegaly and heterogeneous liver parenchyma that could represent fatty infiltration or hepatitis. 2. Although no overt cirrhotic change, there is evidence of portal hypertension with splenomegaly and enlarged paraumbilical  vein. 3. No evidence of biliary obstruction to explain jaundice.   Electronically Signed   By: Tiburcio Pea   On: 10/28/2012 00:52   Dg Abd 2 Views  10/30/2012   CLINICAL DATA:  Mid abdominal pain and pressure  EXAM: ABDOMEN - 2 VIEW  COMPARISON:  CT abdomen 10/28/2012  FINDINGS: The liver is enlarged similar to comparison CT. There are no dilated loops of large or small bowel. Moderate volume of stool in the ascending colon. Gas in the rectum. No pathologic calcifications.  IMPRESSION: 1. Hepatomegaly.  2. No evidence of bowel obstruction.   Electronically Signed   By: Genevive Bi M.D.   On: 10/30/2012 16:50   Dg Abd Acute W/chest  10/27/2012   CLINICAL DATA:  Fever and constipation. Abdominal pain.  EXAM: ACUTE ABDOMEN SERIES (ABDOMEN 2 VIEW & CHEST 1 VIEW)  COMPARISON:  None.  FINDINGS: Elevated right diaphragm of uncertain cause. Low volume chest without focal opacity. No cardiomegaly.  Question hepatomegaly given  elevated right diaphragm and extension nearly to the pelvic inlet. The stomach appears slightly displaced to the left additionally. Nonobstructive bowel gas pattern. No abnormal intra-abdominal calcification. Negative osseous structures.  IMPRESSION: 1. Nonobstructive bowel gas pattern. No pneumoperitoneum. 2. Elevated right diaphragm, possibly from hepatomegaly. 3. Clear chest.   Electronically Signed   By: Tiburcio Pea   On: 10/27/2012 21:54    Jeoffrey Massed, MD  [facility] Pager:[phone]  If 7PM-7AM, please contact night-coverage [email or web address] Password [facility] 11/08/2012, 12:38 PM   LOS: 12 days

## 2012-11-08 NOTE — Progress Notes (Signed)
Family conference with Jerene Bears at bedside-translator also in the room. Have repeatedly explained that patient has severe alcoholic hepatitis, he needs to completely stop all ETOH use, and avoid hepatotoxic medications. No Tylenol as well. Have asked patient to start taking new medications, only after speaking with his PCP. Patient is requesting discharge, and is already dressed. I have explained that the LFT's have now plateaued, and will likely take weeks/months before they normalize. Patient is agreeable to discharge, and family will keep an eye on him closely, and bring him to his follow up appointments, and make sure he does not consume ETOH! Patient wants to go home, he and family are aware that in case he worsens he needs to seek immediate medical attention, they are aware that he will need very close monitoring of his LFT's at the clinic, and will require repeated follow ups. Will discharge today at patient's own request.

## 2012-11-10 LAB — CULTURE, BLOOD (ROUTINE X 2)
Culture: NO GROWTH
Culture: NO GROWTH

## 2012-11-14 ENCOUNTER — Encounter: Payer: Self-pay | Admitting: Internal Medicine

## 2012-11-14 ENCOUNTER — Ambulatory Visit: Payer: Self-pay | Attending: Family Medicine | Admitting: Internal Medicine

## 2012-11-14 ENCOUNTER — Other Ambulatory Visit: Payer: Self-pay | Admitting: Internal Medicine

## 2012-11-14 VITALS — BP 120/74 | HR 83 | Temp 98.0°F | Resp 16 | Ht 66.0 in | Wt 160.0 lb

## 2012-11-14 DIAGNOSIS — E871 Hypo-osmolality and hyponatremia: Secondary | ICD-10-CM

## 2012-11-14 DIAGNOSIS — D72829 Elevated white blood cell count, unspecified: Secondary | ICD-10-CM

## 2012-11-14 DIAGNOSIS — D649 Anemia, unspecified: Secondary | ICD-10-CM

## 2012-11-14 DIAGNOSIS — K769 Liver disease, unspecified: Secondary | ICD-10-CM | POA: Insufficient documentation

## 2012-11-14 DIAGNOSIS — K649 Unspecified hemorrhoids: Secondary | ICD-10-CM | POA: Insufficient documentation

## 2012-11-14 DIAGNOSIS — R17 Unspecified jaundice: Secondary | ICD-10-CM

## 2012-11-14 MED ORDER — DSS 100 MG PO CAPS: 200.0000 mg | ORAL_CAPSULE | Freq: Two times a day (BID) | ORAL | Status: DC

## 2012-11-14 MED ORDER — SODIUM BICARBONATE 650 MG PO TABS: 1300.0000 mg | ORAL_TABLET | Freq: Two times a day (BID) | ORAL | Status: DC

## 2012-11-14 MED ORDER — FUROSEMIDE 20 MG PO TABS: 20.0000 mg | ORAL_TABLET | Freq: Every day | ORAL | Status: DC

## 2012-11-14 MED ORDER — HYDROXYZINE HCL 10 MG PO TABS: 10.0000 mg | ORAL_TABLET | Freq: Three times a day (TID) | ORAL | Status: DC | PRN

## 2012-11-14 MED ORDER — OMEPRAZOLE 40 MG PO CPDR: 40.0000 mg | DELAYED_RELEASE_CAPSULE | Freq: Every day | ORAL | Status: DC

## 2012-11-14 MED ORDER — SPIRONOLACTONE 100 MG PO TABS: 100.0000 mg | ORAL_TABLET | Freq: Every day | ORAL | Status: DC

## 2012-11-14 MED ORDER — HYDROCORTISONE ACETATE 25 MG RE SUPP: 25.0000 mg | Freq: Two times a day (BID) | RECTAL | Status: DC | PRN

## 2012-11-14 MED ORDER — POTASSIUM CHLORIDE CRYS ER 20 MEQ PO TBCR: 40.0000 meq | EXTENDED_RELEASE_TABLET | Freq: Every day | ORAL | Status: DC

## 2012-11-14 MED ORDER — PROPRANOLOL HCL 10 MG PO TABS: 10.0000 mg | ORAL_TABLET | Freq: Two times a day (BID) | ORAL | Status: DC

## 2012-11-14 MED ORDER — POLYETHYLENE GLYCOL 3350 17 G PO PACK: 17.0000 g | PACK | Freq: Two times a day (BID) | ORAL | Status: DC

## 2012-11-14 NOTE — Progress Notes (Signed)
HFU Pt was in the hospital for 22 days with alcohol poisoning. Pt has jaundice.  He reports of itching from the chest up. Pt wants to know the test results from the hospital.

## 2012-11-14 NOTE — Progress Notes (Signed)
Patient ID: Danny Arnold, male   DOB: [birth year], 37 y.o.   MRN: [id number] Patient Demographics  Danny Arnold, is a 37 y.o. male  WUJ:[id number]  NFA:[id number]  DOB - 30-Jan-1976  Chief Complaint  Patient presents with  . Establish Care        Subjective:   Danny Arnold today is here to establish primary care.  The patient is a 37 year old Hispanic male, known to me from his hospital admission, was found to have acute transaminitis, hyperbilirubinemia, alcoholic liver disease, had Citrobacter bacteremia. Patient presented for followup with the interpreter. He he states he is doing much better, has not touched alcohol since he was discharged. His only complaint is that he is still itching. The pedal edema has significantly improved.   Objective:    Filed Vitals:   11/14/12 1517  BP: 120/74  Pulse: 83  Temp: 98 F (36.7 C)  TempSrc: Oral  Resp: 16  Height: 5\' 6"  (1.676 m)  Weight: 160 lb (72.576 kg)  SpO2: 100%     ALLERGIES:  No Known Allergies  PAST MEDICAL HISTORY: History reviewed. No pertinent past medical history.  PAST SURGICAL HISTORY: Past Surgical History  Procedure Laterality Date  . Hand surgery      FAMILY HISTORY: Family History  Problem Relation Age of Onset  . Migraines Mother     MEDICATIONS AT HOME: Prior to Admission medications   Medication Sig Start Date End Date Taking? Authorizing Provider  ciprofloxacin (CIPRO) 500 MG tablet Take 1 tablet (500 mg total) by mouth 2 (two) times daily. 11/08/12  Yes Shanker Levora Dredge, MD  Docusate Sodium (DSS) 100 MG CAPS Take 200 mg by mouth 2 (two) times daily. 11/14/12  Yes Ripudeep Jenna Luo, MD  feeding supplement (RESOURCE BREEZE) LIQD Take 1 Container by mouth 3 (three) times daily between meals. 11/08/12  Yes Shanker Levora Dredge, MD  folic acid (FOLVITE) 1 MG tablet Take 1 tablet (1 mg total) by mouth daily. 11/08/12  Yes Shanker Levora Dredge, MD  furosemide (LASIX) 20 MG tablet Take 1 tablet (20  mg total) by mouth daily. 11/14/12  Yes Ripudeep Jenna Luo, MD  hydrocortisone (ANUSOL-HC) 25 MG suppository Place 1 suppository (25 mg total) rectally 2 (two) times daily as needed for hemorrhoids. 11/14/12  Yes Ripudeep Jenna Luo, MD  Multiple Vitamin (MULTIVITAMIN WITH MINERALS) TABS tablet Take 1 tablet by mouth daily. 11/08/12  Yes Shanker Levora Dredge, MD  omeprazole (PRILOSEC) 40 MG capsule Take 1 capsule (40 mg total) by mouth daily. 11/14/12  Yes Ripudeep Jenna Luo, MD  polyethylene glycol (MIRALAX / GLYCOLAX) packet Take 17 g by mouth 2 (two) times daily. 11/14/12  Yes Ripudeep Jenna Luo, MD  potassium chloride SA (K-DUR,KLOR-CON) 20 MEQ tablet Take 2 tablets (40 mEq total) by mouth daily. 11/14/12  Yes Ripudeep Jenna Luo, MD  propranolol (INDERAL) 10 MG tablet Take 1 tablet (10 mg total) by mouth 2 (two) times daily. 11/14/12  Yes Ripudeep Jenna Luo, MD  sodium bicarbonate 650 MG tablet Take 2 tablets (1,300 mg total) by mouth 2 (two) times daily. 11/14/12  Yes Ripudeep Jenna Luo, MD  spironolactone (ALDACTONE) 100 MG tablet Take 1 tablet (100 mg total) by mouth daily. 11/14/12  Yes Ripudeep Jenna Luo, MD  thiamine 100 MG tablet Take 1 tablet (100 mg total) by mouth daily. 11/08/12  Yes Shanker Levora Dredge, MD    REVIEW OF SYSTEMS:  Constitutional:   No   Fevers, chills, fatigue.  HEENT:  No headaches, Sore throat,   Cardio-vascular: No chest pain,  Orthopnea, swelling in lower extremities, anasarca, palpitations  GI:  No abdominal pain, nausea, vomiting, diarrhea  Resp: No shortness of breath,  No coughing up of blood.No cough.No wheezing.  Skin:  no rash or lesions.  GU:  no dysuria, change in color of urine, no urgency or frequency.  No flank pain.  Musculoskeletal: No joint pain or swelling.  No decreased range of motion.  No back pain.  Psych: No change in mood or affect. No depression or anxiety.  No memory loss.   Exam  General appearance :Awake, alert, NAD, Speech Clear. HEENT: Atraumatic and  Normocephalic, PERLA + Scleral icterus has improved Neck: supple, no JVD. No cervical lymphadenopathy.  Chest: clear to auscultation bilaterally, no wheezing, rales or rhonchi CVS: S1 S2 regular, no murmurs.  Abdomen: soft, NBS, NT, ND, no gaurding, rigidity or rebound. Extremities: No cyanosis, clubbing, B/L Lower Ext shows 2+ edema bilateral lower extremities concentrated around ankles (during the hospitalization was up to thighs)  Neurology: Awake alert, and oriented X 3, CN II-XII intact, Non focal Skin:No Rash or lesions Wounds: N/A    Data Review   Basic Metabolic Panel:  Recent Labs Lab 11/08/12 0500  NA 130*  K 3.4*  CL 104  CO2 15*  GLUCOSE 86  BUN 10  CREATININE 0.64  CALCIUM 7.8*   Liver Function Tests:  Recent Labs Lab 11/08/12 0500  AST 166*  ALT 73*  ALKPHOS 170*  BILITOT 22.0*  PROT 6.3  ALBUMIN 1.5*    CBC:  Recent Labs Lab 11/08/12 0500  WBC 19.6*  HGB 8.9*  HCT 24.9*  MCV 94.7  PLT 235   ------------------------------------------------------------------------------------------------------------------ No results found for this basename: HGBA1C,  in the last 72 hours ------------------------------------------------------------------------------------------------------------------ No results found for this basename: CHOL, HDL, LDLCALC, TRIG, CHOLHDL, LDLDIRECT,  in the last 72 hours ------------------------------------------------------------------------------------------------------------------ No results found for this basename: TSH, T4TOTAL, FREET3, T3FREE, THYROIDAB,  in the last 72 hours ------------------------------------------------------------------------------------------------------------------ No results found for this basename: VITAMINB12, FOLATE, FERRITIN, TIBC, IRON, RETICCTPCT,  in the last 72 hours  Coagulation profile   Recent Labs Lab 11/08/12 1400  INR 1.53*      Assessment & Plan   Active  Problems: Alcoholic liver disease with hyperbilirubinemia - Patient appears to be doing well, claims to be compliant with all his medication, has not touched alcohol -Refilled all his medications, BP 120/74, not enough room to increase Lasix - Placed him on Atarax as needed for pruritus - Recheck CMET, CBC, INR today  Hemorrhoids: - Continue stool softeners and suppositories  Recommendations:Followup on the labs   Follow-up in 4 weeks   RAI,RIPUDEEP M.D. 11/14/2012, 3:54 PM

## 2012-11-15 ENCOUNTER — Telehealth: Payer: Self-pay | Admitting: Internal Medicine

## 2012-11-15 ENCOUNTER — Other Ambulatory Visit: Payer: Self-pay | Admitting: Internal Medicine

## 2012-11-15 DIAGNOSIS — R17 Unspecified jaundice: Secondary | ICD-10-CM

## 2012-11-15 LAB — COMPREHENSIVE METABOLIC PANEL
AST: 223 U/L — ABNORMAL HIGH (ref 0–37)
Albumin: 2.4 g/dL — ABNORMAL LOW (ref 3.5–5.2)
BUN: 12 mg/dL (ref 6–23)
CO2: 14 mEq/L — ABNORMAL LOW (ref 19–32)
Calcium: 8.4 mg/dL (ref 8.4–10.5)
Chloride: 106 mEq/L (ref 96–112)
Creat: 0.96 mg/dL (ref 0.50–1.35)
Glucose, Bld: 106 mg/dL — ABNORMAL HIGH (ref 70–99)
Potassium: 4.8 mEq/L (ref 3.5–5.3)
Total Bilirubin: 29.2 mg/dL — ABNORMAL HIGH (ref 0.3–1.2)

## 2012-11-15 NOTE — Telephone Encounter (Signed)
[facility] calling with critical Lab.  Please call back.

## 2012-11-24 ENCOUNTER — Inpatient Hospital Stay (HOSPITAL_COMMUNITY)
Admission: EM | Admit: 2012-11-24 | Discharge: 2012-12-02 | DRG: 393 | Disposition: A | Discharge status: Home / Self Care | Payer: Self-pay | Attending: Internal Medicine | Admitting: Internal Medicine

## 2012-11-24 ENCOUNTER — Inpatient Hospital Stay (HOSPITAL_COMMUNITY): Payer: No Typology Code available for payment source

## 2012-11-24 ENCOUNTER — Encounter (HOSPITAL_COMMUNITY): Payer: Self-pay | Admitting: Emergency Medicine

## 2012-11-24 ENCOUNTER — Emergency Department (HOSPITAL_COMMUNITY): Payer: No Typology Code available for payment source

## 2012-11-24 DIAGNOSIS — E876 Hypokalemia: Secondary | ICD-10-CM | POA: Diagnosis not present

## 2012-11-24 DIAGNOSIS — K7010 Alcoholic hepatitis without ascites: Secondary | ICD-10-CM

## 2012-11-24 DIAGNOSIS — D689 Coagulation defect, unspecified: Secondary | ICD-10-CM | POA: Diagnosis present

## 2012-11-24 DIAGNOSIS — E872 Acidosis, unspecified: Secondary | ICD-10-CM

## 2012-11-24 DIAGNOSIS — D638 Anemia in other chronic diseases classified elsewhere: Secondary | ICD-10-CM | POA: Diagnosis present

## 2012-11-24 DIAGNOSIS — Z79899 Other long term (current) drug therapy: Secondary | ICD-10-CM

## 2012-11-24 DIAGNOSIS — K648 Other hemorrhoids: Principal | ICD-10-CM | POA: Diagnosis present

## 2012-11-24 DIAGNOSIS — D72829 Elevated white blood cell count, unspecified: Secondary | ICD-10-CM

## 2012-11-24 DIAGNOSIS — E871 Hypo-osmolality and hyponatremia: Secondary | ICD-10-CM | POA: Diagnosis present

## 2012-11-24 DIAGNOSIS — D649 Anemia, unspecified: Secondary | ICD-10-CM

## 2012-11-24 DIAGNOSIS — K7030 Alcoholic cirrhosis of liver without ascites: Secondary | ICD-10-CM | POA: Diagnosis present

## 2012-11-24 DIAGNOSIS — K766 Portal hypertension: Secondary | ICD-10-CM | POA: Diagnosis present

## 2012-11-24 DIAGNOSIS — K859 Acute pancreatitis without necrosis or infection, unspecified: Secondary | ICD-10-CM | POA: Diagnosis present

## 2012-11-24 DIAGNOSIS — B1920 Unspecified viral hepatitis C without hepatic coma: Secondary | ICD-10-CM | POA: Diagnosis present

## 2012-11-24 DIAGNOSIS — K922 Gastrointestinal hemorrhage, unspecified: Secondary | ICD-10-CM

## 2012-11-24 DIAGNOSIS — D62 Acute posthemorrhagic anemia: Secondary | ICD-10-CM

## 2012-11-24 DIAGNOSIS — I8510 Secondary esophageal varices without bleeding: Secondary | ICD-10-CM

## 2012-11-24 DIAGNOSIS — D72820 Lymphocytosis (symptomatic): Secondary | ICD-10-CM | POA: Diagnosis not present

## 2012-11-24 DIAGNOSIS — R109 Unspecified abdominal pain: Secondary | ICD-10-CM

## 2012-11-24 DIAGNOSIS — R791 Abnormal coagulation profile: Secondary | ICD-10-CM

## 2012-11-24 DIAGNOSIS — K2980 Duodenitis without bleeding: Secondary | ICD-10-CM | POA: Diagnosis present

## 2012-11-24 HISTORY — DX: Anemia, unspecified: D64.9

## 2012-11-24 HISTORY — DX: Alcoholic hepatitis without ascites: K70.10

## 2012-11-24 HISTORY — DX: Bacteremia: R78.81

## 2012-11-24 HISTORY — DX: Alcoholic liver disease, unspecified: K70.9

## 2012-11-24 LAB — OCCULT BLOOD X 1 CARD TO LAB, STOOL: Fecal Occult Bld: POSITIVE — AB

## 2012-11-24 LAB — COMPREHENSIVE METABOLIC PANEL
ALT: 62 U/L — ABNORMAL HIGH (ref 0–53)
AST: 162 U/L — ABNORMAL HIGH (ref 0–37)
Albumin: 1.3 g/dL — ABNORMAL LOW (ref 3.5–5.2)
Alkaline Phosphatase: 227 U/L — ABNORMAL HIGH (ref 39–117)
BUN: 18 mg/dL (ref 6–23)
Calcium: 7.3 mg/dL — ABNORMAL LOW (ref 8.4–10.5)
Chloride: 100 mEq/L (ref 96–112)
Creatinine, Ser: 1.15 mg/dL (ref 0.50–1.35)
Potassium: 3.8 mEq/L (ref 3.5–5.1)
Sodium: 124 mEq/L — ABNORMAL LOW (ref 135–145)
Total Bilirubin: 18.2 mg/dL (ref 0.3–1.2)
Total Protein: 5.7 g/dL — ABNORMAL LOW (ref 6.0–8.3)

## 2012-11-24 LAB — URINALYSIS, ROUTINE W REFLEX MICROSCOPIC
Glucose, UA: NEGATIVE mg/dL
Hgb urine dipstick: NEGATIVE
Ketones, ur: NEGATIVE mg/dL
Specific Gravity, Urine: 1.011 (ref 1.005–1.030)
pH: 6 (ref 5.0–8.0)

## 2012-11-24 LAB — CBC WITH DIFFERENTIAL/PLATELET
Basophils Relative: 0 % (ref 0–1)
Eosinophils Absolute: 0.5 10*3/uL (ref 0.0–0.7)
Eosinophils Relative: 1 % (ref 0–5)
HCT: 13.3 % — ABNORMAL LOW (ref 39.0–52.0)
Hemoglobin: 4.8 g/dL — CL (ref 13.0–17.0)
Lymphs Abs: 2.4 10*3/uL (ref 0.7–4.0)
MCH: 34.3 pg — ABNORMAL HIGH (ref 26.0–34.0)
MCHC: 36.1 g/dL — ABNORMAL HIGH (ref 30.0–36.0)
Monocytes Absolute: 3.8 10*3/uL — ABNORMAL HIGH (ref 0.1–1.0)
Neutro Abs: 40.3 10*3/uL — ABNORMAL HIGH (ref 1.7–7.7)
Neutrophils Relative %: 86 % — ABNORMAL HIGH (ref 43–77)
RBC: 1.4 MIL/uL — ABNORMAL LOW (ref 4.22–5.81)

## 2012-11-24 LAB — PROTIME-INR
INR: 1.76 — ABNORMAL HIGH (ref 0.00–1.49)
Prothrombin Time: 20 seconds — ABNORMAL HIGH (ref 11.6–15.2)

## 2012-11-24 LAB — MRSA PCR SCREENING: MRSA by PCR: NEGATIVE

## 2012-11-24 LAB — URINE MICROSCOPIC-ADD ON

## 2012-11-24 MED ORDER — ONDANSETRON HCL 4 MG/2ML IJ SOLN: 4.0000 mg | Freq: Three times a day (TID) | INTRAMUSCULAR | Status: AC | PRN

## 2012-11-24 MED ORDER — PEG 3350-KCL-NABCB-NACL-NASULF 236 G PO SOLR
2000.0000 mL | Freq: Once | ORAL | Status: AC
  Administered 2012-11-25: 2000 mL via ORAL
  Filled 2012-11-24: qty 4000

## 2012-11-24 MED ORDER — HYDROMORPHONE HCL PF 1 MG/ML IJ SOLN: 1.0000 mg | INTRAMUSCULAR | Status: DC | PRN

## 2012-11-24 MED ORDER — HYDROMORPHONE HCL PF 1 MG/ML IJ SOLN: 1.0000 mg | INTRAMUSCULAR | Status: AC | PRN

## 2012-11-24 MED ORDER — ACETAMINOPHEN 325 MG PO TABS: 650.0000 mg | ORAL_TABLET | Freq: Four times a day (QID) | ORAL | Status: DC | PRN

## 2012-11-24 MED ORDER — DIPHENHYDRAMINE HCL 25 MG PO CAPS: 25.0000 mg | ORAL_CAPSULE | Freq: Four times a day (QID) | ORAL | Status: DC | PRN

## 2012-11-24 MED ORDER — HYDROCODONE-ACETAMINOPHEN 5-325 MG PO TABS: 1.0000 | ORAL_TABLET | Freq: Four times a day (QID) | ORAL | Status: DC | PRN

## 2012-11-24 MED ORDER — DEXTROSE 5 % IV SOLN
2.0000 g | Freq: Three times a day (TID) | INTRAVENOUS | Status: DC
  Administered 2012-11-24 – 2012-11-25 (×2): 2 g via INTRAVENOUS
  Filled 2012-11-24 (×4): qty 2

## 2012-11-24 MED ORDER — PEG 3350-KCL-NA BICARB-NACL 420 G PO SOLR
2000.0000 mL | Freq: Once | ORAL | Status: AC
  Administered 2012-11-24: 2000 mL via ORAL
  Filled 2012-11-24: qty 4000

## 2012-11-24 MED ORDER — ONDANSETRON HCL 4 MG/2ML IJ SOLN: 4.0000 mg | Freq: Four times a day (QID) | INTRAMUSCULAR | Status: DC | PRN

## 2012-11-24 MED ORDER — THIAMINE HCL 100 MG/ML IJ SOLN
Freq: Once | INTRAVENOUS | Status: AC
  Administered 2012-11-24: 15:00:00 via INTRAVENOUS
  Filled 2012-11-24: qty 1000

## 2012-11-24 MED ORDER — ACETAMINOPHEN 650 MG RE SUPP: 650.0000 mg | Freq: Four times a day (QID) | RECTAL | Status: DC | PRN

## 2012-11-24 MED ORDER — OCTREOTIDE ACETATE 50 MCG/ML IJ SOLN
50.0000 ug | Freq: Once | INTRAMUSCULAR | Status: DC
  Filled 2012-11-24: qty 1

## 2012-11-24 MED ORDER — OCTREOTIDE LOAD VIA INFUSION
50.0000 ug | Freq: Once | INTRAVENOUS | Status: AC
  Filled 2012-11-24: qty 25

## 2012-11-24 MED ORDER — SODIUM CHLORIDE 0.9 % IV SOLN
50.0000 ug/h | INTRAVENOUS | Status: DC
  Administered 2012-11-24 – 2012-11-25 (×2): 50 ug/h via INTRAVENOUS
  Filled 2012-11-24 (×5): qty 1

## 2012-11-24 MED ORDER — SODIUM CHLORIDE 0.9 % IV BOLUS (SEPSIS)
500.0000 mL | Freq: Once | INTRAVENOUS | Status: AC
  Administered 2012-11-24: 500 mL via INTRAVENOUS

## 2012-11-24 MED ORDER — SODIUM CHLORIDE 0.9 % IV SOLN
80.0000 mg | INTRAVENOUS | Status: AC
  Administered 2012-11-24: 80 mg via INTRAVENOUS
  Filled 2012-11-24: qty 80

## 2012-11-24 MED ORDER — SODIUM CHLORIDE 0.9 % IV BOLUS (SEPSIS)
1000.0000 mL | Freq: Once | INTRAVENOUS | Status: AC
  Administered 2012-11-24: 1000 mL via INTRAVENOUS

## 2012-11-24 MED ORDER — SODIUM CHLORIDE 0.9 % IV SOLN
8.0000 mg/h | INTRAVENOUS | Status: DC
  Filled 2012-11-24 (×3): qty 80

## 2012-11-24 MED ORDER — MORPHINE SULFATE 4 MG/ML IJ SOLN
4.0000 mg | Freq: Once | INTRAMUSCULAR | Status: AC
  Administered 2012-11-24: 4 mg via INTRAVENOUS
  Filled 2012-11-24: qty 1

## 2012-11-24 MED ORDER — ONDANSETRON HCL 4 MG PO TABS
4.0000 mg | ORAL_TABLET | Freq: Four times a day (QID) | ORAL | Status: DC | PRN
  Administered 2012-12-01: 4 mg via ORAL
  Filled 2012-11-24: qty 1

## 2012-11-24 MED ORDER — METOCLOPRAMIDE HCL 5 MG/ML IJ SOLN
10.0000 mg | Freq: Once | INTRAMUSCULAR | Status: AC
  Administered 2012-11-24: 10 mg via INTRAVENOUS
  Filled 2012-11-24: qty 2

## 2012-11-24 MED ORDER — METOCLOPRAMIDE HCL 5 MG/ML IJ SOLN
10.0000 mg | Freq: Once | INTRAMUSCULAR | Status: AC
  Administered 2012-11-25: 10 mg via INTRAVENOUS
  Filled 2012-11-24: qty 2

## 2012-11-24 NOTE — ED Notes (Signed)
Per pt family sts drank some juice last night and since has had abdominal pain and rectal bleeding. Pt very jaundice. sts has been like this for 1 week.

## 2012-11-24 NOTE — ED Provider Notes (Signed)
CSN: [id number]     Arrival date & time 11/24/12  1154 History   First MD Initiated Contact with Patient 11/24/12 1204     Chief Complaint  Patient presents with  . Abdominal Pain  . Rectal Bleeding   (Consider location/radiation/quality/duration/timing/severity/associated sxs/prior Treatment) HPI   Danny Arnold is a 37 y.o. male with alcoholic hepatitis c/o normally formed bloody stool onset  2AM associated with resolving abdominal pain worse than is baseline. Denies fever, N/V, hematemesis, CP, SOB, increasing abdominal girth, fever. Pt has not had EtOH to drink in the last month. Native spanish speaker.   History reviewed. No pertinent past medical history. Past Surgical History  Procedure Laterality Date  . Hand surgery     Family History  Problem Relation Age of Onset  . Migraines Mother    History  Substance Use Topics  . Smoking status: Never Smoker   . Smokeless tobacco: Not on file  . Alcohol Use: Yes     Comment: heavy etoh until 02/2012- since then occasional    Review of Systems 10 systems reviewed and found to be negative, except as noted in the HPI   Allergies  Review of patient's allergies indicates no known allergies.  Home Medications   Current Outpatient Rx  Name  Route  Sig  Dispense  Refill  . feeding supplement (RESOURCE BREEZE) LIQD   Oral   Take 1 Container by mouth 3 (three) times daily between meals.   90 Container   0   . ciprofloxacin (CIPRO) 500 MG tablet   Oral   Take 1 tablet (500 mg total) by mouth 2 (two) times daily.   18 tablet   0   . Docusate Sodium (DSS) 100 MG CAPS   Oral   Take 200 mg by mouth 2 (two) times daily.   120 each   2   . folic acid (FOLVITE) 1 MG tablet   Oral   Take 1 tablet (1 mg total) by mouth daily.   30 tablet   0   . furosemide (LASIX) 20 MG tablet   Oral   Take 1 tablet (20 mg total) by mouth daily.   30 tablet   2   . hydrocortisone (ANUSOL-HC) 25 MG suppository   Rectal   Place 1  suppository (25 mg total) rectally 2 (two) times daily as needed for hemorrhoids.   24 suppository   4   . hydrOXYzine (ATARAX/VISTARIL) 10 MG tablet   Oral   Take 1 tablet (10 mg total) by mouth 3 (three) times daily as needed for itching.   90 tablet   3   . Multiple Vitamin (MULTIVITAMIN WITH MINERALS) TABS tablet   Oral   Take 1 tablet by mouth daily.   30 tablet   0   . omeprazole (PRILOSEC) 40 MG capsule   Oral   Take 1 capsule (40 mg total) by mouth daily.   30 capsule   2   . polyethylene glycol (MIRALAX / GLYCOLAX) packet   Oral   Take 17 g by mouth 2 (two) times daily.   30 each   4   . potassium chloride SA (K-DUR,KLOR-CON) 20 MEQ tablet   Oral   Take 2 tablets (40 mEq total) by mouth daily.   60 tablet   2   . propranolol (INDERAL) 10 MG tablet   Oral   Take 1 tablet (10 mg total) by mouth 2 (two) times daily.   60 tablet   2   .  sodium bicarbonate 650 MG tablet   Oral   Take 2 tablets (1,300 mg total) by mouth 2 (two) times daily.   120 tablet   1   . spironolactone (ALDACTONE) 100 MG tablet   Oral   Take 1 tablet (100 mg total) by mouth daily.   30 tablet   2   . thiamine 100 MG tablet   Oral   Take 1 tablet (100 mg total) by mouth daily.   30 tablet   0    BP 98/47  Pulse 115  Temp(Src) 98.8 F (37.1 C)  Resp 18  SpO2 100% Physical Exam  Nursing note and vitals reviewed. Constitutional: He is oriented to person, place, and time. He appears well-developed and well-nourished. No distress.  HENT:  Head: Normocephalic.  Jaundice, pale conjunctiva.   Eyes: Conjunctivae and EOM are normal. Pupils are equal, round, and reactive to light.  Cardiovascular: Normal rate.   Pulmonary/Chest: Effort normal and breath sounds normal. No stridor. No respiratory distress. He has no wheezes. He has no rales. He exhibits no tenderness.  Abdominal: Soft. Bowel sounds are normal. He exhibits no distension and no mass. There is no tenderness. There is  no rebound and no guarding.  Normal bowel sound, no distention or fluid wave. Diffusely TTP with no guarding or rebound  Musculoskeletal: Normal range of motion.  Neurological: He is alert and oriented to person, place, and time.  Psychiatric: He has a normal mood and affect.    ED Course  Procedures (including critical care time)  CRITICAL CARE Performed by: Wynetta Emery   Total critical care time: 45  Critical care time was exclusive of separately billable procedures and treating other patients.  Critical care was necessary to treat or prevent imminent or life-threatening deterioration.  Critical care was time spent personally by me on the following activities: development of treatment plan with patient and/or surrogate as well as nursing, discussions with consultants, evaluation of patient's response to treatment, examination of patient, obtaining history from patient or surrogate, ordering and performing treatments and interventions, ordering and review of laboratory studies, ordering and review of radiographic studies, pulse oximetry and re-evaluation of patient's condition.  Labs Review Labs Reviewed  PROTIME-INR - Abnormal; Notable for the following:    Prothrombin Time 20.0 (*)    INR 1.76 (*)    All other components within normal limits  CBC WITH DIFFERENTIAL - Abnormal; Notable for the following:    WBC 47.0 (*)    RBC 1.40 (*)    Hemoglobin 4.8 (*)    HCT 13.3 (*)    MCH 34.3 (*)    MCHC 36.1 (*)    RDW 17.3 (*)    Platelets 401 (*)    Neutrophils Relative % 86 (*)    Lymphocytes Relative 5 (*)    Neutro Abs 40.3 (*)    Monocytes Absolute 3.8 (*)    All other components within normal limits  LIPASE, BLOOD - Abnormal; Notable for the following:    Lipase 256 (*)    All other components within normal limits  OCCULT BLOOD X 1 CARD TO LAB, STOOL - Abnormal; Notable for the following:    Fecal Occult Bld POSITIVE (*)    All other components within normal  limits  COMPREHENSIVE METABOLIC PANEL - Abnormal; Notable for the following:    Sodium 124 (*)    CO2 9 (*)    Glucose, Bld 117 (*)    Calcium 7.3 (*)  Total Protein 5.7 (*)    Albumin 1.3 (*)    AST 162 (*)    ALT 62 (*)    Alkaline Phosphatase 227 (*)    Total Bilirubin 18.2 (*)    GFR calc non Af Amer 81 (*)    All other components within normal limits  URINE CULTURE  CULTURE, BLOOD (ROUTINE X 2)  CULTURE, BLOOD (ROUTINE X 2)  ETHANOL  AMMONIA  URINALYSIS, ROUTINE W REFLEX MICROSCOPIC  TYPE AND SCREEN  PREPARE RBC (CROSSMATCH)   Imaging Review Dg Chest 2 View  11/24/2012   CLINICAL DATA:  Shortness of breath, rectal bleeding.  EXAM: CHEST  2 VIEW  COMPARISON:  October 27, 2012.  FINDINGS: The heart size and mediastinal contours are within normal limits. Both lungs are clear. The visualized skeletal structures are unremarkable.  IMPRESSION: No active cardiopulmonary disease.   Electronically Signed   By: Roque Lias M.D.   On: 11/24/2012 14:48    EKG Interpretation   None       MDM   1. GI bleeding   2. Leucocytosis   3. Anemia   4. Elevated INR   5. Abdominal pain   6. Acute blood loss anemia   7. 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 male with PMH of alcoholic hepatitis, has not drank in one month c/o bloody stool early this AM, no hematemesis . Abd exam is non-surgical, no gross ascites. Severe icterus, minimal peripheral edema.   Pt with severe anemia 4.8/13.3, FOBT positive, no active diarrhea, protonix started, Type and screen for transfusion initiated. Hemodynamically stable. INR elevated.   Pt also has leukocytosis of 47K, no obvious source of infection, Abd exam benign, doubt SBP.  CXR ordered.   Pt will be admitted to triad.   Medications  pantoprazole (PROTONIX) 80 mg in sodium chloride 0.9 % 100 mL IVPB (80 mg Intravenous New Bag/Given 11/24/12 1449)  sodium chloride 0.9 % 1,000 mL with thiamine 100 mg, folic acid 1 mg, multivitamins adult 10 mL infusion ( Intravenous New Bag/Given 11/24/12 1453)  morphine 4 MG/ML injection 4 mg (4 mg Intravenous Given 11/24/12 1253)  sodium chloride 0.9 % bolus 500 mL (0 mLs Intravenous Stopped 11/24/12 1429)  sodium chloride 0.9 % bolus 1,000 mL (1,000 mLs Intravenous New Bag/Given 11/24/12 1449)    Note: Portions of this report may have been transcribed using voice recognition software. Every effort was made to ensure accuracy; however, inadvertent computerized transcription errors may be present      Wynetta Emery, PA-C 11/25/12 1641

## 2012-11-24 NOTE — Consult Note (Addendum)
[facility] Consultation  Requesting Provider: Dr. Isidoro Donning - Hospitalist Primary Care Physician:  [name], MD Primary Gastroenterologist: Seen by [facility] last month unassigned awaiting outpatient f/u     Reason for Consultation:  Hematochezia  - Alcoholic Liver Disease - Severe Anemia  Assessment:  1) Hematochezia/Rectal Bleeding Chronic    2) Severe anemia - acute on chronic - probably multifactorial from blood loss, bone marrow suppression, portal httn? Other    3) Severe Leukocytosis - cause not clear    4) Alcoholic liver disease - hepatitis at least, ? Some cirrhosis - labs improving - discriminant fx 28.9    5) Elevated lipase, ? cause         Recommendations: 1) EGD/colonoscopy tomorrow - Dr. Juanda Chance    2) Agree with IV Octreotide and PPI, blood.    3) Peripheral smear pathology review    4) Check [place]    5) recheck lipase, check amylase         HPI: Danny Arnold is a 37 y.o. [place] male with known alcoholic heptitis. Admitted in September. Claims abstinence since then. He has had chronic intermittent rectal bleeding and anemia. He had a more severe episode of bleeding at 2 AM today. He is now being admitted due to severe anemia and the bleeding. He has mild epigastric pain. No nausea or vomiting, no melena. He does have some itching. No fevers.    Past Medical History  Diagnosis Date  . Jaundice   . Hyperbilirubinemia   . Anemia   . Alcoholic liver disease   . Bacteremia   . Cholangitis   . Hyponatremia   . Leucocytosis   . Alcoholic hepatitis     Past Surgical History  Procedure Laterality Date  . Hand surgery      Prior to Admission medications   Medication Sig Start Date End Date Taking? Authorizing Provider  feeding supplement (RESOURCE BREEZE) LIQD Take 1 Container by mouth 3 (three) times daily between meals. 11/08/12  Yes Shanker Levora Dredge, MD  ciprofloxacin (CIPRO) 500 MG tablet Take 1 tablet (500 mg total) by mouth 2 (two) times  daily. 11/08/12   Shanker Levora Dredge, MD  Docusate Sodium (DSS) 100 MG CAPS Take 200 mg by mouth 2 (two) times daily. 11/14/12   Ripudeep Jenna Luo, MD  folic acid (FOLVITE) 1 MG tablet Take 1 tablet (1 mg total) by mouth daily. 11/08/12   Shanker Levora Dredge, MD  furosemide (LASIX) 20 MG tablet Take 1 tablet (20 mg total) by mouth daily. 11/14/12   Ripudeep Jenna Luo, MD  hydrocortisone (ANUSOL-HC) 25 MG suppository Place 1 suppository (25 mg total) rectally 2 (two) times daily as needed for hemorrhoids. 11/14/12   Ripudeep Jenna Luo, MD  hydrOXYzine (ATARAX/VISTARIL) 10 MG tablet Take 1 tablet (10 mg total) by mouth 3 (three) times daily as needed for itching. 11/14/12   Ripudeep Jenna Luo, MD  Multiple Vitamin (MULTIVITAMIN WITH MINERALS) TABS tablet Take 1 tablet by mouth daily. 11/08/12   Shanker Levora Dredge, MD  omeprazole (PRILOSEC) 40 MG capsule Take 1 capsule (40 mg total) by mouth daily. 11/14/12   Ripudeep Jenna Luo, MD  polyethylene glycol (MIRALAX / GLYCOLAX) packet Take 17 g by mouth 2 (two) times daily. 11/14/12   Ripudeep Jenna Luo, MD  potassium chloride SA (K-DUR,KLOR-CON) 20 MEQ tablet Take 2 tablets (40 mEq total) by mouth daily. 11/14/12   Ripudeep Jenna Luo, MD  propranolol (INDERAL) 10 MG tablet Take 1 tablet (10 mg total)  by mouth 2 (two) times daily. 11/14/12   Ripudeep Jenna Luo, MD  sodium bicarbonate 650 MG tablet Take 2 tablets (1,300 mg total) by mouth 2 (two) times daily. 11/14/12   Ripudeep Jenna Luo, MD  spironolactone (ALDACTONE) 100 MG tablet Take 1 tablet (100 mg total) by mouth daily. 11/14/12   Ripudeep Jenna Luo, MD  thiamine 100 MG tablet Take 1 tablet (100 mg total) by mouth daily. 11/08/12   Shanker Levora Dredge, MD    Current Facility-Administered Medications  Medication Dose Route Frequency Provider Last Rate Last Dose  . octreotide (SANDOSTATIN) 2 mcg/mL load via infusion 50 mcg  50 mcg Intravenous Once Ripudeep K Rai, MD       And  . octreotide (SANDOSTATIN) 2 mcg/mL in sodium chloride 0.9 % 250 mL infusion  50  mcg/hr Intravenous Continuous Ripudeep Jenna Luo, MD       Current Outpatient Prescriptions  Medication Sig Dispense Refill  . feeding supplement (RESOURCE BREEZE) LIQD Take 1 Container by mouth 3 (three) times daily between meals.  90 Container  0  . ciprofloxacin (CIPRO) 500 MG tablet Take 1 tablet (500 mg total) by mouth 2 (two) times daily.  18 tablet  0  . Docusate Sodium (DSS) 100 MG CAPS Take 200 mg by mouth 2 (two) times daily.  120 each  2  . folic acid (FOLVITE) 1 MG tablet Take 1 tablet (1 mg total) by mouth daily.  30 tablet  0  . furosemide (LASIX) 20 MG tablet Take 1 tablet (20 mg total) by mouth daily.  30 tablet  2  . hydrocortisone (ANUSOL-HC) 25 MG suppository Place 1 suppository (25 mg total) rectally 2 (two) times daily as needed for hemorrhoids.  24 suppository  4  . hydrOXYzine (ATARAX/VISTARIL) 10 MG tablet Take 1 tablet (10 mg total) by mouth 3 (three) times daily as needed for itching.  90 tablet  3  . Multiple Vitamin (MULTIVITAMIN WITH MINERALS) TABS tablet Take 1 tablet by mouth daily.  30 tablet  0  . omeprazole (PRILOSEC) 40 MG capsule Take 1 capsule (40 mg total) by mouth daily.  30 capsule  2  . polyethylene glycol (MIRALAX / GLYCOLAX) packet Take 17 g by mouth 2 (two) times daily.  30 each  4  . potassium chloride SA (K-DUR,KLOR-CON) 20 MEQ tablet Take 2 tablets (40 mEq total) by mouth daily.  60 tablet  2  . propranolol (INDERAL) 10 MG tablet Take 1 tablet (10 mg total) by mouth 2 (two) times daily.  60 tablet  2  . sodium bicarbonate 650 MG tablet Take 2 tablets (1,300 mg total) by mouth 2 (two) times daily.  120 tablet  1  . spironolactone (ALDACTONE) 100 MG tablet Take 1 tablet (100 mg total) by mouth daily.  30 tablet  2  . thiamine 100 MG tablet Take 1 tablet (100 mg total) by mouth daily.  30 tablet  0    Allergies as of 11/24/2012  . (No Known Allergies)    Family History  Problem Relation Age of Onset  . Migraines Mother     History   Social  History  . Marital Status: Single   Social History Main Topics  . Smoking status: Never Smoker   . Smokeless tobacco: Not on file  . Alcohol Use: Yes     Comment: heavy etoh until 02/2012- since then occasional  . Drug Use: No   Social History Narrative  . Georganna Skeans, has children  Review of Systems: Positive for fatigue, weakness All other review of systems negative except as mentioned in the HPI.  Physical Exam: Vital signs in last 24 hours: Temp:  [98.5 F (36.9 C)-98.8 F (37.1 C)] 98.5 F (36.9 C) (10/12 1320) Pulse Rate:  [95-115] 102 (10/12 1645) Resp:  [18-20] 20 (10/12 1515) BP: (95-123)/(40-52) 123/47 mmHg (10/12 1645) SpO2:  [100 %] 100 % (10/12 1645) Weight:  [163 lb (73.936 kg)] 163 lb (73.936 kg) (10/12 1255)   General:   Alert,  Well-developed, well-nourished, pleasant and cooperative in NAD Eyes:  Sclera icteric.   Conjunctiva pink. Mouth:  No deformity or lesions.  Oropharynx jaundiced, missing teeth. Neck:  Supple; no masses or thyromegaly. Lungs:  Clear throughout to auscultation.   Heart:  Regular rate and rhythm; + systolic murmur Abdomen:  Soft, nontender and  Mildly distended. No masses, + small reducible umbilical hernia. Normal bowel sounds, without guarding, and without rebound.   Rectal:   Blood per ED Extremities:  Without clubbing or edema. Neurologic:  Alert and  oriented x 3 Skin:  + spider angiomata, some excoriation upper trunk Lymph Nodes:  No significant cervical or supraclavicular adenopathy. Psych:  Alert and cooperative. Normal mood and affect.  Lab Results:  Recent Labs  11/24/12 1235  WBC 47.0*  HGB 4.8*  HCT 13.3*  PLT 401*   Hgb in Sept was 8.9 and was 7 before that BMET  Recent Labs  11/24/12 1235  NA 124*  K 3.8  CL 100  CO2 9*  GLUCOSE 117*  BUN 18  CREATININE 1.15  CALCIUM 7.3*   LFT  Recent Labs  11/24/12 1235  PROT 5.7*  ALBUMIN 1.3*  AST 162*  ALT 62*  ALKPHOS 227*  BILITOT 18.2*   T bili  29 on 10/2  PT/INR  Recent Labs  11/24/12 1235  LABPROT 20.0*  INR 1.76*   Studies/Results: Dg Chest 2 View  11/24/2012   CLINICAL DATA:  Shortness of breath, rectal bleeding.  EXAM: CHEST  2 VIEW  COMPARISON:  October 27, 2012.  FINDINGS: The heart size and mediastinal contours are within normal limits. Both lungs are clear. The visualized skeletal structures are unremarkable.  IMPRESSION: No active cardiopulmonary disease.   Electronically Signed   By: Roque Lias M.D.   On: 11/24/2012 14:48   Previous Endoscopies: none   LOS: 0 days   @Carl  Sena Slate, MD, [facility] [phone] (pager) 11/24/2012 6:07 PM@

## 2012-11-24 NOTE — H&P (Signed)
History and Physical       Hospital Admission Note Date: 11/24/2012  Patient name: Danny Arnold Medical record number: [id number] Date of birth: 1975/02/24 Age: 36 y.o. Gender: male PCP: [name], MD     Chief Complaint:  Rectal bleeding  Used Engineer, structural. Patient does not speak [place].  HPI: Patient is a 37 year old male with history of alcoholic hepatitis, history of hyperbilirubinemia who was originally admitted in September 2014 with acute jaundice and was attributed to alcoholic hepatitis. Patient was seen by gastroenterology (Dr Marina Goodell) at that time and recommended complete alcohol cessation. The patient presented to the today stating that he had noticed very small amount of blood with the stools daily and attributed to be from hemorrhoids. However this morning at 2 AM, he had massive amount of rectal bleeding, patient reported that he was sitting on the toilet for an hour and half. When he got up he was very dizzy and lightheaded but he did not have any syncopal episode. He had some abdominal discomfort but no fever chills, nausea, vomiting or hematemesis. - In the ER, workup showed a sodium of 124, metabolic acidosis with bicarbonate 9, creatinine of 1.15, Anion Gap 15 . Alkaline phosphatase has trended up to 227, AST 162, ALT 62,  lipase 256, total bilirubin 18.2 . WBC is 47.0 hemoglobin 4.8, hematocrit 13.3    Review of Systems:  Constitutional: Denies fever, chills, diaphoresis, + poor appetite and fatigue.  HEENT: Denies photophobia, eye pain, redness, hearing loss, ear pain, congestion, sore throat, rhinorrhea, sneezing, mouth sores, trouble swallowing, neck pain, neck stiffness and tinnitus.   Respiratory: Denies SOB, DOE, cough, chest tightness,  and wheezing.   Cardiovascular: Denies chest pain, palpitations and leg swelling.  Gastrointestinal:  please see history of present illness   Genitourinary:  Denies dysuria, urgency, frequency, hematuria, flank pain and difficulty urinating.  Musculoskeletal: Denies myalgias, back pain, joint swelling, arthralgias and gait problem.  Skin:  patient has jaundiced skin  Neurological: Denies seizures, syncope, numbness and headaches.  + dizzy and lightheaded  Hematological: Denies adenopathy.personal or family bleeding history  Psychiatric/Behavioral: Denies suicidal ideation, mood changes, confusion, nervousness, sleep disturbance and agitation  Past Medical History: Past Medical History  Diagnosis Date  . Jaundice   . Hyperbilirubinemia   . Anemia   . Alcoholic liver disease   . Bacteremia   . Cholangitis   . Hyponatremia   . Leucocytosis   . Alcoholic hepatitis    Past Surgical History  Procedure Laterality Date  . Hand surgery      Medications: Prior to Admission medications   Medication Sig Start Date End Date Taking? Authorizing Provider  feeding supplement (RESOURCE BREEZE) LIQD Take 1 Container by mouth 3 (three) times daily between meals. 11/08/12  Yes Shanker Levora Dredge, MD  ciprofloxacin (CIPRO) 500 MG tablet Take 1 tablet (500 mg total) by mouth 2 (two) times daily. 11/08/12   Shanker Levora Dredge, MD  Docusate Sodium (DSS) 100 MG CAPS Take 200 mg by mouth 2 (two) times daily. 11/14/12   Jaece Ducharme Jenna Luo, MD  folic acid (FOLVITE) 1 MG tablet Take 1 tablet (1 mg total) by mouth daily. 11/08/12   Shanker Levora Dredge, MD  furosemide (LASIX) 20 MG tablet Take 1 tablet (20 mg total) by mouth daily. 11/14/12   Sandy Blouch Jenna Luo, MD  hydrocortisone (ANUSOL-HC) 25 MG suppository Place 1 suppository (25 mg total) rectally 2 (two) times daily as needed for hemorrhoids. 11/14/12   Baird Polinski Jenna Luo, MD  hydrOXYzine (ATARAX/VISTARIL) 10 MG tablet Take 1 tablet (10 mg total) by mouth 3 (three) times daily as needed for itching. 11/14/12   [place] Churchill Jenna Luo, MD  Multiple Vitamin (MULTIVITAMIN WITH MINERALS) TABS tablet Take 1 tablet by mouth daily. 11/08/12   Shanker  Levora Dredge, MD  omeprazole (PRILOSEC) 40 MG capsule Take 1 capsule (40 mg total) by mouth daily. 11/14/12   Deisy Ozbun Jenna Luo, MD  polyethylene glycol (MIRALAX / GLYCOLAX) packet Take 17 g by mouth 2 (two) times daily. 11/14/12   Jayin Derousse Jenna Luo, MD  potassium chloride SA (K-DUR,KLOR-CON) 20 MEQ tablet Take 2 tablets (40 mEq total) by mouth daily. 11/14/12   Anacristina Steffek Jenna Luo, MD  propranolol (INDERAL) 10 MG tablet Take 1 tablet (10 mg total) by mouth 2 (two) times daily. 11/14/12   [place] Mckeough Jenna Luo, MD  sodium bicarbonate 650 MG tablet Take 2 tablets (1,300 mg total) by mouth 2 (two) times daily. 11/14/12   Veralyn Lopp Jenna Luo, MD  spironolactone (ALDACTONE) 100 MG tablet Take 1 tablet (100 mg total) by mouth daily. 11/14/12   Kasha Howeth Jenna Luo, MD  thiamine 100 MG tablet Take 1 tablet (100 mg total) by mouth daily. 11/08/12   Shanker Levora Dredge, MD    Allergies:  No Known Allergies  Social History:  reports that he has never smoked. He does not have any smokeless tobacco history on file. He reports that he drinks alcohol. He reports that he does not use illicit drugs.  Family History: Family History  Problem Relation Age of Onset  . Migraines Mother     Physical Exam: Blood pressure 123/47, pulse 102, temperature 98.5 F (36.9 C), temperature source Rectal, resp. rate 20, height 5\' 7"  (1.702 m), weight 73.936 kg (163 lb), SpO2 100.00%. General: Alert, awake, oriented x3, in no acute distress. HEENT: normocephalic, atraumatic, anicteric sclera, pink conjunctiva, pupils equal and reactive to light and accomodation, oropharynx clear Neck: supple, no masses or lymphadenopathy, no goiter, no bruits  Heart: Regular rate and rhythm, without murmurs, rubs or gallops. Lungs: Clear to auscultation bilaterally, no wheezing, rales or rhonchi. Abdomen: Soft, nontender, nondistended, positive bowel sounds, no masses. Extremities: No clubbing, cyanosis or edema with positive pedal pulses. Neuro: Grossly intact, no focal  neurological deficits, strength 5/5 upper and lower extremities bilaterally Psych: alert and oriented x 3, normal mood and affect Skin: no rashes or lesions, warm and dry   LABS on Admission:  Basic Metabolic Panel:  Recent Labs Lab 11/24/12 1235  NA 124*  K 3.8  CL 100  CO2 9*  GLUCOSE 117*  BUN 18  CREATININE 1.15  CALCIUM 7.3*   Liver Function Tests:  Recent Labs Lab 11/24/12 1235  AST 162*  ALT 62*  ALKPHOS 227*  BILITOT 18.2*  PROT 5.7*  ALBUMIN 1.3*    Recent Labs Lab 11/24/12 1235  LIPASE 256*    Recent Labs Lab 11/24/12 1235  AMMONIA 18   CBC:  Recent Labs Lab 11/24/12 1235  WBC 47.0*  NEUTROABS 40.3*  HGB 4.8*  HCT 13.3*  MCV 95.0  PLT 401*   Cardiac Enzymes: No results found for this basename: CKTOTAL, CKMB, CKMBINDEX, TROPONINI,  in the last 168 hours BNP: No components found with this basename: POCBNP,  CBG: No results found for this basename: GLUCAP,  in the last 168 hours   Radiological Exams on Admission: Dg Chest 2 View  11/24/2012   CLINICAL DATA:  Shortness of breath, rectal bleeding.  EXAM: CHEST  2  VIEW  COMPARISON:  October 27, 2012.  FINDINGS: The heart size and mediastinal contours are within normal limits. Both lungs are clear. The visualized skeletal structures are unremarkable.  IMPRESSION: No active cardiopulmonary disease.   Electronically Signed   By: Roque Lias M.D.   On: 11/24/2012 14:48   US Abdomen Complete  10/30/2012   *RADIOLOGY REPORT*  Clinical Data: Increasing liver enzymes.  ABDOMEN ULTRASOUND  Technique:  Complete abdominal ultrasound examination was performed including evaluation of the liver, gallbladder, bile ducts, pancreas, kidneys, spleen, IVC, and abdominal aorta.  Comparison: CT scan from 10/28/2012  Findings:  Gallbladder:  Gallbladder wall is thickened at 6 mm and shows intramural edema.  No evidence for pericholecystic fluid. Tumefactive sludge is noted within the lumen but no definite  gallstones are evident.  The sonographer reports no sonographic Murphy's sign.  Common Bile Duct:  Nondilated at 3 mm diameter.  Liver:  Echogenic liver suggests fatty infiltration.  Liver is noted to be enlarged on the recent CT scan.  No intrahepatic biliary duct dilatation.  IVC:  Normal.  Pancreas:  Not well seen secondary overlying midline bowel gas.  Spleen:  Normal.  Right kidney:  13.2 cm in long axis.  Normal.  Left kidney:  12.3 cm in long axis.  Normal.  Abdominal Aorta:  No aneurysm is evident.  No evidence for ascites.  IMPRESSION: Gallbladder sludge with gallbladder wall thickening and intramural edema.  No gallstones are evident, but acute cholecystitis cannot be excluded by imaging.  In the setting of liver disease, gallbladder wall thickening can be nonacute, there is no evidence for associated ascites on the current study.  Nuclear scintigraphy may prove helpful to evaluate for acute cholecystitis as clinically warranted.   Original Report Authenticated By: Kennith Center, M.D.   Ct Abdomen Pelvis W Contrast  10/28/2012   CLINICAL DATA:  Jaundice  EXAM: CT ABDOMEN AND PELVIS WITH CONTRAST  TECHNIQUE: Multidetector CT imaging of the abdomen and pelvis was performed using the standard protocol following bolus administration of intravenous contrast.  CONTRAST:  OMNIPAQUE IOHEXOL 300 MG/ML  SOLN  COMPARISON:  None.  FINDINGS: BODY WALL: Fat containing umbilical hernia  LOWER CHEST:  Mediastinum: Unremarkable.  Lungs/pleura: Elevated right diaphragm with mild basilar atelectasis  ABDOMEN/PELVIS:  Liver: Enlarged liver (25 cm craniocaudal span midclavicular line), with diffuse patchy hypodensity or hypo-enhancement. No focal abnormality or definitive nodular contour. There is a recannulized/enlarged periumbilical vein.  Biliary: No evidence of biliary obstruction or stone.  Pancreas: Unremarkable.  Spleen: Enlarged at 14 cm craniocaudal span. No focal abnormality.  Adrenals: Unremarkable.  Kidneys  and ureters: No hydronephrosis or stone.  Bladder: Unremarkable.  Bowel: No obstruction. Normal appendix.  Retroperitoneum: No mass or adenopathy.  Peritoneum: No free fluid or gas.  Reproductive: Unremarkable.  Vascular: No acute abnormality.  OSSEOUS: No acute abnormalities. No suspicious lytic or blastic lesions.  IMPRESSION: 1. Hepatomegaly and heterogeneous liver parenchyma that could represent fatty infiltration or hepatitis. 2. Although no overt cirrhotic change, there is evidence of portal hypertension with splenomegaly and enlarged paraumbilical vein. 3. No evidence of biliary obstruction to explain jaundice.   Electronically Signed   By: Tiburcio Pea   On: 10/28/2012 00:52   Dg Abd 2 Views  10/30/2012   CLINICAL DATA:  Mid abdominal pain and pressure  EXAM: ABDOMEN - 2 VIEW  COMPARISON:  CT abdomen 10/28/2012  FINDINGS: The liver is enlarged similar to comparison CT. There are no dilated loops of large or  small bowel. Moderate volume of stool in the ascending colon. Gas in the rectum. No pathologic calcifications.  IMPRESSION: 1. Hepatomegaly.  2. No evidence of bowel obstruction.   Electronically Signed   By: Genevive Bi M.D.   On: 10/30/2012 16:50   Dg Abd Acute W/chest  10/27/2012   CLINICAL DATA:  Fever and constipation. Abdominal pain.  EXAM: ACUTE ABDOMEN SERIES (ABDOMEN 2 VIEW & CHEST 1 VIEW)  COMPARISON:  None.  FINDINGS: Elevated right diaphragm of uncertain cause. Low volume chest without focal opacity. No cardiomegaly.  Question hepatomegaly given elevated right diaphragm and extension nearly to the pelvic inlet. The stomach appears slightly displaced to the left additionally. Nonobstructive bowel gas pattern. No abnormal intra-abdominal calcification. Negative osseous structures.  IMPRESSION: 1. Nonobstructive bowel gas pattern. No pneumoperitoneum. 2. Elevated right diaphragm, possibly from hepatomegaly. 3. Clear chest.   Electronically Signed   By: Tiburcio Pea   On:  10/27/2012 21:54    Assessment/Plan Principal Problem:   GI bleed With acute blood loss anemia - Hemoglobin down to 4.8 from 8.9 on 9/26, concern about the variceal bleeding, last CT scan in 9/14 had shown changes of portal hypertension, has a history of alcoholic hepatitis. He does not have a history of prior endoscopy however patient was on propranolol. - Admit to step down, transfuse 2 units of packed rbc's, octreotide loading dose with the infusion, n.p.o. - Discussed with gastroenterology, Dr Leone Payor for consultation, will need endoscopy.   Active Problems:   Jaundice With alcoholic hepatitis  - Patient had extensive workup done in previous admission and the jaundice and hyperbilirubinemia was attributed to chronic hepatitis. Patient now reports that he has not touched alcohol since that admission     Hyponatremia - Patient has a history of liver disease and chronic hyponatremia however also has hypovolemenia due to GI losses today. He has received IV fluids, now blood transfusion has been ordered. Will recheck CMET in a.m.     Leucocytosis: Unclear etiology, patient has not been on any steroids  - He does not have any significant ascitis or abdominal discomfort at this time, he was on ciprofloxacin for SBP prophylaxis. Placed on cefotaxime for now. Blood cultures are pending.   DVT prophylaxis: SCDS   CODE STATUS:  full CODE STATUS   Further plan will depend as patient's clinical course evolves and further radiologic and laboratory data become available.   Time Spent on Admission: 1 hour  Ysidra Sopher M.D. [facility] 11/24/2012, 5:22 PM Pager: [phone]  If 7PM-7AM, please contact night-coverage [email or web address] Password [email or web address]

## 2012-11-24 NOTE — Progress Notes (Signed)
Called to get report. Nusre unble to report at this  Time. Danny Arnold

## 2012-11-25 ENCOUNTER — Encounter (HOSPITAL_COMMUNITY): Payer: Self-pay

## 2012-11-25 ENCOUNTER — Encounter (HOSPITAL_COMMUNITY)
Admission: EM | Disposition: A | Discharge status: Home / Self Care | Payer: Self-pay | Source: Home / Self Care | Attending: Internal Medicine

## 2012-11-25 DIAGNOSIS — K766 Portal hypertension: Secondary | ICD-10-CM | POA: Diagnosis present

## 2012-11-25 DIAGNOSIS — I8510 Secondary esophageal varices without bleeding: Secondary | ICD-10-CM

## 2012-11-25 DIAGNOSIS — K7030 Alcoholic cirrhosis of liver without ascites: Secondary | ICD-10-CM

## 2012-11-25 HISTORY — PX: ESOPHAGOGASTRODUODENOSCOPY: SHX5428

## 2012-11-25 HISTORY — PX: COLONOSCOPY: SHX5424

## 2012-11-25 LAB — COMPREHENSIVE METABOLIC PANEL
ALT: 74 U/L — ABNORMAL HIGH (ref 0–53)
AST: 250 U/L — ABNORMAL HIGH (ref 0–37)
Albumin: 1.5 g/dL — ABNORMAL LOW (ref 3.5–5.2)
Alkaline Phosphatase: 229 U/L — ABNORMAL HIGH (ref 39–117)
BUN: 16 mg/dL (ref 6–23)
Calcium: 8 mg/dL — ABNORMAL LOW (ref 8.4–10.5)
Chloride: 104 mEq/L (ref 96–112)
GFR calc Af Amer: >90 mL/min (ref >90)
Glucose, Bld: 95 mg/dL (ref 70–99)
Potassium: 3.2 mEq/L — ABNORMAL LOW (ref 3.5–5.1)
Sodium: 129 mEq/L — ABNORMAL LOW (ref 135–145)
Total Bilirubin: 19 mg/dL (ref 0.3–1.2)
Total Protein: 6 g/dL (ref 6.0–8.3)

## 2012-11-25 LAB — CBC
HCT: 20.8 % — ABNORMAL LOW (ref 39.0–52.0)
HCT: 18.5 % — ABNORMAL LOW (ref 39.0–52.0)
Hemoglobin: 7.5 g/dL — ABNORMAL LOW (ref 13.0–17.0)
Hemoglobin: 6.4 g/dL — CL (ref 13.0–17.0)
MCH: 30.6 pg (ref 26.0–34.0)
MCHC: 36.1 g/dL — ABNORMAL HIGH (ref 30.0–36.0)
MCHC: 34.6 g/dL (ref 30.0–36.0)
Platelets: 294 10*3/uL (ref 150–400)
RBC: 2.09 MIL/uL — ABNORMAL LOW (ref 4.22–5.81)
RDW: 17.6 % — ABNORMAL HIGH (ref 11.5–15.5)
RDW: 18.6 % — ABNORMAL HIGH (ref 11.5–15.5)
WBC: 30.5 10*3/uL — ABNORMAL HIGH (ref 4.0–10.5)
WBC: 18.9 10*3/uL — ABNORMAL HIGH (ref 4.0–10.5)

## 2012-11-25 LAB — HEMOGLOBIN A1C
Hgb A1c MFr Bld: 4.9 % (ref <5.7)
Mean Plasma Glucose: 94 mg/dL (ref <117)

## 2012-11-25 LAB — SAVE SMEAR

## 2012-11-25 LAB — PREPARE RBC (CROSSMATCH)

## 2012-11-25 LAB — TSH: TSH: 0.5 u[IU]/mL (ref 0.350–4.500)

## 2012-11-25 LAB — URINE CULTURE
Colony Count: NO GROWTH
Culture: NO GROWTH

## 2012-11-25 LAB — AMYLASE: Amylase: 250 U/L — ABNORMAL HIGH (ref 0–105)

## 2012-11-25 LAB — PRO B NATRIURETIC PEPTIDE: Pro B Natriuretic peptide (BNP): 166.6 pg/mL — ABNORMAL HIGH (ref 0–125)

## 2012-11-25 SURGERY — COLONOSCOPY
Anesthesia: Moderate Sedation

## 2012-11-25 MED ORDER — POTASSIUM CHLORIDE CRYS ER 20 MEQ PO TBCR
40.0000 meq | EXTENDED_RELEASE_TABLET | Freq: Every day | ORAL | Status: DC
  Administered 2012-11-25 – 2012-11-28 (×4): 40 meq via ORAL
  Filled 2012-11-25 (×5): qty 2

## 2012-11-25 MED ORDER — FOLIC ACID 1 MG PO TABS: 1.0000 mg | ORAL_TABLET | Freq: Every day | ORAL | Status: DC

## 2012-11-25 MED ORDER — HYDROCORTISONE ACETATE 25 MG RE SUPP
25.0000 mg | Freq: Two times a day (BID) | RECTAL | Status: DC
  Administered 2012-11-25 – 2012-12-02 (×14): 25 mg via RECTAL
  Filled 2012-11-25 (×17): qty 1

## 2012-11-25 MED ORDER — SODIUM CHLORIDE 0.9 % IV SOLN
INTRAVENOUS | Status: DC
  Administered 2012-11-25: 09:00:00 via INTRAVENOUS
  Filled 2012-11-25 (×5): qty 1000

## 2012-11-25 MED ORDER — ADULT MULTIVITAMIN W/MINERALS CH
1.0000 | ORAL_TABLET | Freq: Every day | ORAL | Status: DC
  Administered 2012-11-25 – 2012-12-02 (×8): 1 via ORAL
  Filled 2012-11-25 (×8): qty 1

## 2012-11-25 MED ORDER — FOLIC ACID 1 MG PO TABS
1.0000 mg | ORAL_TABLET | Freq: Every day | ORAL | Status: DC
  Administered 2012-11-25 – 2012-12-02 (×8): 1 mg via ORAL
  Filled 2012-11-25 (×8): qty 1

## 2012-11-25 MED ORDER — CIPROFLOXACIN HCL 500 MG PO TABS
500.0000 mg | ORAL_TABLET | Freq: Two times a day (BID) | ORAL | Status: DC
  Administered 2012-11-25 – 2012-11-26 (×2): 500 mg via ORAL
  Filled 2012-11-25 (×4): qty 1

## 2012-11-25 MED ORDER — SODIUM CHLORIDE 0.9 % IV SOLN: INTRAVENOUS | Status: DC

## 2012-11-25 MED ORDER — DIPHENHYDRAMINE HCL 50 MG/ML IJ SOLN
INTRAMUSCULAR | Status: DC | PRN
  Administered 2012-11-25: 25 mg via INTRAVENOUS

## 2012-11-25 MED ORDER — FENTANYL CITRATE 0.05 MG/ML IJ SOLN
INTRAMUSCULAR | Status: AC
  Filled 2012-11-25: qty 2

## 2012-11-25 MED ORDER — SPIRONOLACTONE 100 MG PO TABS
100.0000 mg | ORAL_TABLET | Freq: Every day | ORAL | Status: DC
  Administered 2012-11-25: 100 mg via ORAL
  Filled 2012-11-25 (×2): qty 1

## 2012-11-25 MED ORDER — POLYETHYLENE GLYCOL 3350 17 G PO PACK
17.0000 g | PACK | Freq: Two times a day (BID) | ORAL | Status: DC
  Administered 2012-11-25: 17 g via ORAL
  Filled 2012-11-25 (×3): qty 1

## 2012-11-25 MED ORDER — BUTAMBEN-TETRACAINE-BENZOCAINE 2-2-14 % EX AERO
INHALATION_SPRAY | CUTANEOUS | Status: DC | PRN
  Administered 2012-11-25: 2 via TOPICAL

## 2012-11-25 MED ORDER — DOCUSATE SODIUM 100 MG PO CAPS
200.0000 mg | ORAL_CAPSULE | Freq: Two times a day (BID) | ORAL | Status: DC
  Administered 2012-11-25 – 2012-12-02 (×12): 200 mg via ORAL
  Filled 2012-11-25 (×15): qty 2

## 2012-11-25 MED ORDER — FENTANYL CITRATE 0.05 MG/ML IJ SOLN
INTRAMUSCULAR | Status: DC | PRN
  Administered 2012-11-25 (×2): 25 ug via INTRAVENOUS

## 2012-11-25 MED ORDER — HYDROCORTISONE ACETATE 25 MG RE SUPP
25.0000 mg | Freq: Two times a day (BID) | RECTAL | Status: DC | PRN
  Filled 2012-11-25: qty 1

## 2012-11-25 MED ORDER — VITAMIN B-1 100 MG PO TABS
100.0000 mg | ORAL_TABLET | Freq: Every day | ORAL | Status: DC
  Administered 2012-11-25 – 2012-12-02 (×8): 100 mg via ORAL
  Filled 2012-11-25 (×8): qty 1

## 2012-11-25 MED ORDER — PROPRANOLOL HCL 10 MG PO TABS
10.0000 mg | ORAL_TABLET | Freq: Two times a day (BID) | ORAL | Status: DC
  Administered 2012-11-25: 10 mg via ORAL
  Filled 2012-11-25 (×3): qty 1

## 2012-11-25 MED ORDER — BOOST / RESOURCE BREEZE PO LIQD
1.0000 | Freq: Three times a day (TID) | ORAL | Status: DC
  Administered 2012-11-25 – 2012-12-02 (×20): 1 via ORAL

## 2012-11-25 MED ORDER — HYDROXYZINE HCL 10 MG PO TABS
10.0000 mg | ORAL_TABLET | Freq: Three times a day (TID) | ORAL | Status: DC | PRN
  Filled 2012-11-25: qty 1

## 2012-11-25 MED ORDER — SODIUM BICARBONATE 650 MG PO TABS
1300.0000 mg | ORAL_TABLET | Freq: Two times a day (BID) | ORAL | Status: DC
  Administered 2012-11-25 – 2012-12-01 (×12): 1300 mg via ORAL
  Filled 2012-11-25 (×13): qty 2

## 2012-11-25 MED ORDER — DSS 100 MG PO CAPS: 200.0000 mg | ORAL_CAPSULE | Freq: Two times a day (BID) | ORAL | Status: DC

## 2012-11-25 MED ORDER — MIDAZOLAM HCL 10 MG/2ML IJ SOLN
INTRAMUSCULAR | Status: DC | PRN
  Administered 2012-11-25 (×2): 2 mg via INTRAVENOUS

## 2012-11-25 MED ORDER — FUROSEMIDE 20 MG PO TABS
20.0000 mg | ORAL_TABLET | Freq: Every day | ORAL | Status: DC
  Administered 2012-11-25: 20 mg via ORAL
  Filled 2012-11-25 (×2): qty 1

## 2012-11-25 MED ORDER — MIDAZOLAM HCL 5 MG/ML IJ SOLN
INTRAMUSCULAR | Status: AC
  Filled 2012-11-25: qty 2

## 2012-11-25 MED ORDER — PANTOPRAZOLE SODIUM 40 MG PO TBEC
40.0000 mg | DELAYED_RELEASE_TABLET | Freq: Every day | ORAL | Status: DC
  Administered 2012-11-26 – 2012-12-02 (×7): 40 mg via ORAL
  Filled 2012-11-25 (×7): qty 1

## 2012-11-25 NOTE — Op Note (Signed)
[facility] [address] [place], [address]   COLONOSCOPY PROCEDURE REPORT  PATIENT: Danny Arnold  MR#: [id number] BIRTHDATE: 06/15/1975 , 37  yrs. old GENDER: Male ENDOSCOPIST: Hart Carwin, MD REFERRED BY:  Jetty Duhamel, M.D. PROCEDURE DATE:  11/25/2012 PROCEDURE:   Colonoscopy, diagnostic ASA CLASS:   Class III INDICATIONS:Iron Deficiency Anemia and heme-positive stool.  .Hgb 4.8,hx of alcohol use MEDICATIONS: There was residual sedation effect present from prior procedure.  DESCRIPTION OF PROCEDURE:   After the risks and benefits and of the procedure were explained, informed consent was obtained.  A digital rectal exam revealed no abnormalities of the rectum.    The Pentax Ped Colon [id number]  endoscope was introduced through the anus and advanced to the cecum, which was identified by both the appendix and ileocecal valve .  The quality of the prep was excellent, using MoviPrep .  The instrument was then slowly withdrawn as the colon was fully examined.     COLON FINDINGS: Moderate sized internal hemorrhoids were found. Retroflexed views revealed no abnormalities.     The scope was then withdrawn from the patient and the procedure completed.  COMPLICATIONS: There were no complications. ENDOSCOPIC IMPRESSION: Moderate sized internal hemorrhoids no blood in colon No other lesions to explain severe anemia  RECOMMENDATIONS: since upper endoscopy was also nondiagnostic we will observe patient's H&H. Advance his diet and follow his stool Hemoccults. If his anemia continues despite off iron supplements I would suggest small bowel capsule endoscopy Anusol HC supp hs REPEAT EXAM: no  cc:  _______________________________ eSignedHart Carwin, MD 11/25/2012 3:14 PM     PATIENT NAME:  Danny Arnold MR#: [id number]

## 2012-11-25 NOTE — Progress Notes (Signed)
INITIAL NUTRITION ASSESSMENT  DOCUMENTATION CODES Per approved criteria  -Not Applicable   INTERVENTION: - Advance diet as tolerated per MD. - RD will monitor oral intake and add nutrition supplements as needed.   NUTRITION DIAGNOSIS: Inadequate oral intake related to inability to eat as evidenced by NPO status.  Goal: Patient will meet >/=90% of estimated nutrition needs  Monitor:  Diet advancement, PO intake, weights, labs  Reason for Assessment: Malnutrition screening tool  37 y.o. male  Admitting Dx: GI Bleed  ASSESSMENT: Patient with history of alcoholic hepatitis, alcohol abuse, with recent admission last month with jaundice. He was admitted with rectal bleeding. Patient does not speak [place]. Diet is currently NPO. He has lost about 13 pounds in the last month and 15% of his usual body weight in the last 3 months. No changes in appetite or oral intake noted.  Height: Ht Readings from Last 1 Encounters:  11/24/12 5\' 7"  (1.702 m)    Weight: Wt Readings from Last 1 Encounters:  11/25/12 151 lb 0.2 oz (68.5 kg)    Ideal Body Weight: 148 pounds  % Ideal Body Weight: 102%  Wt Readings from Last 10 Encounters:  11/25/12 151 lb 0.2 oz (68.5 kg)  11/25/12 151 lb 0.2 oz (68.5 kg)  11/14/12 160 lb (72.576 kg)  11/08/12 175 lb 11.2 oz (79.697 kg)  09/07/12 178 lb (80.74 kg)    Usual Body Weight: 178 pounds  % Usual Body Weight: 85%  BMI:  Body mass index is 23.65 kg/(m^2). Normal weight.   Estimated Nutritional Needs: Kcal: 1750-1950 kcal Protein: 70-85 g Fluid: 2.4 L/day  Skin: Intact  Diet Order: NPO  EDUCATION NEEDS: -No education needs identified at this time   Intake/Output Summary (Last 24 hours) at 11/25/12 1443 Last data filed at 11/25/12 1400  Gross per 24 hour  Intake 5570.42 ml  Output   1151 ml  Net 4419.42 ml    Last BM: 10/13   Labs:   Recent Labs Lab 11/24/12 1235 11/25/12 0542  NA 124* 129*  K 3.8 3.2*  CL 100 104   CO2 9* 10*  BUN 18 16  CREATININE 1.15 1.06  CALCIUM 7.3* 8.0*  GLUCOSE 117* 95    CBG (last 3)  No results found for this basename: GLUCAP,  in the last 72 hours  Scheduled Meds: . [MAR HOLD] cefoTAXime (CLAFORAN) IV  2 g Intravenous Q8H  . [facility] HOLD] folic acid  1 mg Oral Daily  . [facility] HOLD] octreotide  50 mcg Intravenous Once    Continuous Infusions: . octreotide (SANDOSTATIN) infusion 50 mcg/hr (11/25/12 0518)  . sodium chloride 0.9 % 1,000 mL with potassium chloride 20 mEq infusion 150 mL/hr at 11/25/12 0915    Past Medical History  Diagnosis Date  . Jaundice   . Hyperbilirubinemia   . Anemia   . Alcoholic liver disease   . Bacteremia   . Cholangitis   . Hyponatremia   . Leucocytosis   . Alcoholic hepatitis     Past Surgical History  Procedure Laterality Date  . Hand surgery      Linnell Fulling, RD, LDN Pager #: [phone] After-Hours Pager #: [phone]

## 2012-11-25 NOTE — Progress Notes (Signed)
[facility] Daily Rounding Note 11/25/2012, 8:31 AM  SUBJECTIVE:       RN reports only 2 BMs since starting prep.  These have been blood tinged.  Pt has completed around 3/4 of the golytely prep.   Pt denies pain, denies nausea.  No SOB.    OBJECTIVE:         Vital signs in last 24 hours:    Temp:  [97.4 F (36.3 C)-98.8 F (37.1 C)] 97.4 F (36.3 C) (10/13 0749) Pulse Rate:  [91-115] 91 (10/13 0749) Resp:  [14-32] 15 (10/13 0749) BP: (79-123)/(40-70) 97/57 mmHg (10/13 0749) SpO2:  [97 %-100 %] 100 % (10/13 0749) Weight:  [68.5 kg (151 lb 0.2 oz)-73.936 kg (163 lb)] 68.5 kg (151 lb 0.2 oz) (10/13 0425)   General: ill appearing.  Not in distress   Heart: RRR Chest: clear, good breath sounds.  No cough or dyspnea Abdomen: soft, protuberant.  Not tender.  No mass  Extremities: no CCE Neuro/Psych:  Cooperative.  Affect subdued DERM:  + telangectasia on trunk, + pruritus.   Intake/Output from previous day: 12/07/2022 0701 - 10/13 0700 In: 1030 [I.V.:915; Blood:15; IV Piggyback:100] Out: 700 [Urine:700]  Intake/Output this shift: Total I/O In: 25 [I.V.:25] Out: -   Lab Results:  Recent Labs  2012/12/06 1235 11/25/12 0542  WBC 47.0* 30.5*  HGB 4.8* 7.5*  HCT 13.3* 20.8*  PLT 401* 371   BMET  Recent Labs  December 06, 2012 1235 11/25/12 0542  NA 124* 129*  K 3.8 3.2*  CL 100 104  CO2 9* 10*  GLUCOSE 117* 95  BUN 18 16  CREATININE 1.15 1.06  CALCIUM 7.3* 8.0*   LFT  Recent Labs  Dec 06, 2012 1235 11/25/12 0542  PROT 5.7* 6.0  ALBUMIN 1.3* 1.5*  AST 162* 250*  ALT 62* 74*  ALKPHOS 227* 229*  BILITOT 18.2* 19.0*   PT/INR  Recent Labs  2012-12-06 1235  LABPROT 20.0*  INR 1.76*    Studies/Results: Dg Chest 2 View December 06, 2012  .  IMPRESSION: No active cardiopulmonary disease.   Electronically Signed   By: Roque Lias M.D.   On: Dec 06, 2012 14:48   US Abdomen Complete Dec 06, 2012   CLINICAL DATA:  Alcoholic hepatitis with jaundice and elevated enzymes, assess  for ascites.  EXAM: ULTRASOUND ABDOMEN COMPLETE  COMPARISON:  CT abdomen 10/28/2012. Ultrasound of the abdomen 10/30/2012.  FINDINGS: Gallbladder  Sludge ball versus non shadowing stone in the neck. Negative sonographic Murphy sign. Abnormal wall thickness of 4.7 mm.  Common bile duct  Diameter: No biliary ductal dilatation. Diameter 2.3 mm.  Liver  Echogenic and heterogeneous. Considerations include steatosis and or hepatitis. No focal defects.  IVC  No abnormality visualized.  Pancreas  Limited visualization, no abnormality.  Spleen  Splenomegaly with a length of 13.1 cm. Total volume 629 cc.  Right Kidney  Length: 11.7 cm. Echogenicity within normal limits. No mass or hydronephrosis visualized.  Left Kidney  Length: 11.8 cm Echogenicity within normal limits. No mass or hydronephrosis visualized.  Abdominal aorta  2.1 cm proximally. Distal obscured by bowel gas.  No evidence for abdominal ascites in all 4 quadrants.  IMPRESSION: Gallbladder wall thickening similar to priors. Sludge ball (likely) versus non shadowing stone not seen previously.  Echogenic and heterogeneous liver, question hepatitis versus steatosis.  No visible ascites.   Electronically Signed   By: Davonna Belling M.D.   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  Initial presentation 10/28/12.  Hepatitis ABC, HIV all negative 10/2012. Total Bili improving. AST/ALT rising. Off propanolol and aldactone, lasix added last admission.  *  Macrocytic anemia. Hgb nadir of 4.8 on 11/24/12.  S/p 2 units PRBCs this admission.  Got 2 units during Sept admission.  *  Rectal bleeding *  Weight loss.  13 # loss reported as of admission 10/28/11. 79.6 kg at discharge 9/26, 68.5 kg currently, 73.9 kg yesterday so ? Accuracy of  Scales. .  *  Portal hypertension, fatty liver, splenomegaly, enlarged periumbilical vein by CT 10/28/12 *  Leukocytosis without fever.  Present since 10/27/12 and worsening.  No visible ascites on  imaging so unlikely he has SBP.  No evidence for UTI or PNA.  On empiric Claforan. Citrobacter bacteremia during 9/14-9/26 admission, discharged on Cipro. .  *  GB sludge vs stone on [place] *  Coagulopathy with elevated Protime since 10/2012:  Worsening.  *  Hypokalemia,  *  Hyponatremia, chronic *  Mild elevation of creatinine.  *  Elevated Lipase at 486, no current ultrasound and no previous CT evidence of pancreatitis.     PLAN: *  Colonoscopy, EGD today.  *  Check ANA, AMA, ceruloplasmin, CMET, CBC, lipase.  *  Added oral folic acid.  *  Strict I's and O's, need to watch urine output closely.  *  Keep on octreotide for now. May be able to d/c after EGD but it is indicated if suspicion for HRS *  Ordered tap water enema.  *  Not clear he needs abx.  May want to get ID or heme eval re WBC count.  *  Stop Protonix drip.  Can add po PPI if EGD indicates need.  *  Add NS with potassium at 150 cc per hour.      LOS: 1 day   Jennye Moccasin  11/25/2012, 8:31 AM Pager: [phone] Attending MD note:   I have reviewed the above note, examined the patient and agree with plan of treatment.Please see EGD/colonoscopy note.  Willa [facility] Pager # [phone]

## 2012-11-25 NOTE — Op Note (Signed)
[facility] [address] [place], [address]   ENDOSCOPY PROCEDURE REPORT  PATIENT: Danny Arnold  MR#: [id number] BIRTHDATE: Nov 18, 1975 , 37  yrs. old GENDER: Male ENDOSCOPIST: Hart Carwin, MD REFERRED BY:  Jetty Duhamel, M.D. PROCEDURE DATE:  11/25/2012 PROCEDURE:  EGD w/ biopsy ASA CLASS:     Class III INDICATIONS:  Melena.   hx alcoholic hepatitis, Hgb 4.8,. MEDICATIONS: These medications were titrated to patient response per physician's verbal order, Fentanyl 50 mcg IV, Versed, and Benadryl 25 mg IV TOPICAL ANESTHETIC: Cetacaine Spray  DESCRIPTION OF PROCEDURE: After the risks benefits and alternatives of the procedure were thoroughly explained, informed consent was obtained.  The    endoscope was introduced through the mouth and advanced to the second portion of the duodenum. Without limitations.  The instrument was slowly withdrawn as the mucosa was fully examined.      Esophagus: esophageal mucosa in proximal mid and distal esophagus was also normal. It was one streak of esophageal varices which extended in distal two thirds of the esophagus or the were no stigmata of bleeding such as hemostatic spot areas there is no stricture. Stomach: Ascitic mucosa was mildly erythematous but there was no evidence of gastritis. There was no portal hypertensive gastropathy. Pyloric outlet was normal. Retroflexion of the endoscope revealed normal fundus and cardia. There were no gastric varices Duodenum: There was patchy duodenitis in the bulb and descending duodenum. There were no ulcerations and no stigmata of bleeding. This were taken from duodenal bulb to rule out H. pyloric[ The scope was then withdrawn from the patient and the procedure completed.  COMPLICATIONS: There were no complications. ENDOSCOPIC IMPRESSION: first grade esophageal varices. One of straining. No stigmata of bleeding No blood in the stomach or  duodenum Mild-to-moderate duodenitis. Status post biopsies for H. pylori. There is nothing to account for patient's GI blood loss RECOMMENDATIONS:  await biopsy results Continue PPI orally Discontinue octreotide Proceed with colonoscopy  REPEAT EXAM:  eSigned:  Hart Carwin, MD 11/25/2012 3:09 PM   CC:  PATIENT NAME:  Danny Arnold MR#: [id number]

## 2012-11-25 NOTE — Progress Notes (Signed)
[facility] Progress Note Danny Arnold TEAM 1 - Stepdown ICU Team   [name] ZOX:[id number] DOB: November 05, 1975 DOA: 11/24/2012 PCP: [name], MD  Brief narrative: 37 year old male patient with known alcoholic hepatitis with underlying cirrhosis, baseline total bilirubin around 20. Initially admitted in September of this year with acute jaundice due to same problems. Evaluated by Gastroenterology during that admission and complete alcohol cessation was recommended. Apparently had been in usual state of health until the date of admission when he developed massive rectal bleeding. The patient reported he sat on the toilet for around 1.5 hours but when attempted to stand up he became very dizzy and lightheaded but did not have a syncopal episode. He had some mild abdominal discomfort but no constitutional symptoms. In the ER he was hyponatremic which is a chronic problem for him. He had metabolic acidosis with a bicarbonate of 9. Anion gap 15. Total bilirubin around 18.2. Lipase mildly elevated at 256. Hemoglobin was markedly decreased at 4.8 and his white count was markedly increased at 47,000.  Assessment/Plan:    Cirrhosis, alcoholic/Portal hypertension -cont octreotide -GI following -TB stable    GI bleed/hematochezia -for endoscopy today -no further bleeding since admission    Acute blood loss anemia -hgb up after PRBCs x 4    Hyponatremia -chronic problem is stable    Leukocytosis -trend down - no fever - likely leukomoid rxn    Abdominal pain -doubt SBP but will cont empiric coverage as precaution     DVT prophylaxis: SCDs Code Status: Full Family Communication: Patient only- conversation limited due to patient's minimal understanding of English Disposition Plan/Expected LOS: Stepdown  Consultants: Gastroenterology  Procedures: EGD and colonoscopy are pending  Antibiotics: Cefotaxime 10/12 >>>  HPI/Subjective: Patient alert and sitting on side of  bed. Denies current abdominal pain, nausea vomiting or diarrhea.   Objective: Blood pressure 100/47, pulse 99, temperature 98 F (36.7 C), temperature source Oral, resp. rate 20, height 5\' 7"  (1.702 m), weight 68.5 kg (151 lb 0.2 oz), SpO2 100.00%.  Intake/Output Summary (Last 24 hours) at 11/25/12 1224 Last data filed at 11/25/12 1201  Gross per 24 hour  Intake 5317.5 ml  Output   1151 ml  Net 4166.5 ml   Exam: General: No acute respiratory distress-jaundiced Lungs: Clear to auscultation bilaterally without wheezes or crackles, RA Cardiovascular: Regular rate and rhythm without murmur gallop or rub normal S1 and S2, no peripheral edema or JVD Abdomen: Nontender, slightly distended, soft, bowel sounds positive, no rebound, no ascites, no appreciable mass Musculoskeletal: No significant cyanosis, clubbing of bilateral lower extremities Neurological: Alert, moves all extremities x 4 without focal neurological deficits, CN 2-12 intact  Scheduled Meds:  Scheduled Meds: . cefoTAXime (CLAFORAN) IV  2 g Intravenous Q8H  . folic acid  1 mg Oral Daily  . octreotide  50 mcg Intravenous Once   Continuous Infusions: . octreotide (SANDOSTATIN) infusion 50 mcg/hr (11/25/12 0518)  . sodium chloride 0.9 % 1,000 mL with potassium chloride 20 mEq infusion 150 mL/hr at 11/25/12 0915    Data Reviewed: Basic Metabolic Panel:  Recent Labs Lab 11/24/12 1235 11/25/12 0542  NA 124* 129*  K 3.8 3.2*  CL 100 104  CO2 9* 10*  GLUCOSE 117* 95  BUN 18 16  CREATININE 1.15 1.06  CALCIUM 7.3* 8.0*   Liver Function Tests:  Recent Labs Lab 11/24/12 1235 11/25/12 0542  AST 162* 250*  ALT 62* 74*  ALKPHOS 227* 229*  BILITOT 18.2* 19.0*  PROT 5.7* 6.0  ALBUMIN 1.3* 1.5*    Recent Labs Lab 11/24/12 1235 11/25/12 0542  LIPASE 256* 486*  AMYLASE  --  250*    Recent Labs Lab 11/24/12 1235  AMMONIA 18   CBC:  Recent Labs Lab 11/24/12 1235 11/25/12 0542  WBC 47.0* 30.5*    NEUTROABS 40.3*  --   HGB 4.8* 7.5*  HCT 13.3* 20.8*  MCV 95.0 88.9  PLT 401* 371    Recent Results (from the past 240 hour(s))  CULTURE, BLOOD (ROUTINE X 2)     Status: None   Collection Time    11/24/12  2:10 PM      Result Value Range Status   Specimen Description BLOOD RIGHT ARM   Final   Special Requests     Final   Value: BOTTLES DRAWN AEROBIC AND ANAEROBIC 10CC BLUE 5CCRED   Culture  Setup Time     Final   Value: 11/24/2012 22:17     Performed at [facility]   Culture     Final   Value:        BLOOD CULTURE RECEIVED NO GROWTH TO DATE CULTURE WILL BE HELD FOR 5 DAYS BEFORE ISSUING A FINAL NEGATIVE REPORT     Performed at [facility]   Report Status PENDING   Incomplete  CULTURE, BLOOD (ROUTINE X 2)     Status: None   Collection Time    11/24/12  2:20 PM      Result Value Range Status   Specimen Description BLOOD RIGHT HAND   Final   Special Requests BOTTLES DRAWN AEROBIC ONLY 10CC   Final   Culture  Setup Time     Final   Value: 11/24/2012 22:17     Performed at [facility]   Culture     Final   Value:        BLOOD CULTURE RECEIVED NO GROWTH TO DATE CULTURE WILL BE HELD FOR 5 DAYS BEFORE ISSUING A FINAL NEGATIVE REPORT     Performed at [facility]   Report Status PENDING   Incomplete  MRSA PCR SCREENING     Status: None   Collection Time    11/24/12  6:33 PM      Result Value Range Status   MRSA by PCR NEGATIVE  NEGATIVE Final   Comment:            The GeneXpert MRSA Assay (FDA     approved for NASAL specimens     only), is one component of a     comprehensive MRSA colonization     surveillance program. It is not     intended to diagnose MRSA     infection nor to guide or     monitor treatment for     MRSA infections.     Studies:  Recent x-ray studies have been reviewed in detail by the Attending Physician     Junious Silk, ANP [facility] Office  [phone] Pager [phone]  **If unable to reach  the above provider after paging please contact the Flow Manager @ [phone]  On-Call/Text Page:      [email or web address]      password [email or web address]  If 7PM-7AM, please contact night-coverage [email or web address] Password [email or web address] 11/25/2012, 12:24 PM   LOS: 1 day   I have personally examined this patient and reviewed the entire database. I have reviewed the above note, made any necessary editorial changes, and agree with its content.  Lonia Blood, MD [facility]

## 2012-11-26 ENCOUNTER — Inpatient Hospital Stay: Payer: Self-pay

## 2012-11-26 ENCOUNTER — Ambulatory Visit: Payer: Self-pay

## 2012-11-26 DIAGNOSIS — E872 Acidosis, unspecified: Secondary | ICD-10-CM | POA: Diagnosis present

## 2012-11-26 DIAGNOSIS — D72829 Elevated white blood cell count, unspecified: Secondary | ICD-10-CM

## 2012-11-26 LAB — COMPREHENSIVE METABOLIC PANEL
ALT: 87 U/L — ABNORMAL HIGH (ref 0–53)
BUN: 16 mg/dL (ref 6–23)
CO2: 11 mEq/L — ABNORMAL LOW (ref 19–32)
Calcium: 7.9 mg/dL — ABNORMAL LOW (ref 8.4–10.5)
Chloride: 107 mEq/L (ref 96–112)
Creatinine, Ser: 0.97 mg/dL (ref 0.50–1.35)
GFR calc Af Amer: >90 mL/min (ref >90)
GFR calc non Af Amer: >90 mL/min (ref >90)
Glucose, Bld: 109 mg/dL — ABNORMAL HIGH (ref 70–99)
Sodium: 131 mEq/L — ABNORMAL LOW (ref 135–145)
Total Bilirubin: 16.9 mg/dL — ABNORMAL HIGH (ref 0.3–1.2)
Total Protein: 5.6 g/dL — ABNORMAL LOW (ref 6.0–8.3)

## 2012-11-26 LAB — LACTATE DEHYDROGENASE: LDH: 170 U/L (ref 94–250)

## 2012-11-26 LAB — CBC
Hemoglobin: 7.5 g/dL — ABNORMAL LOW (ref 13.0–17.0)
MCH: 32.8 pg (ref 26.0–34.0)
MCHC: 35.9 g/dL (ref 30.0–36.0)
MCV: 91.3 fL (ref 78.0–100.0)
RBC: 2.29 MIL/uL — ABNORMAL LOW (ref 4.22–5.81)
RDW: 18.4 % — ABNORMAL HIGH (ref 11.5–15.5)
WBC: 20.1 10*3/uL — ABNORMAL HIGH (ref 4.0–10.5)

## 2012-11-26 LAB — BLOOD GAS, ARTERIAL
Drawn by: 32526
O2 Saturation: 98.8 %
pCO2 arterial: 18.5 mmHg — CL (ref 35.0–45.0)
pH, Arterial: 7.383 (ref 7.350–7.450)

## 2012-11-26 LAB — HAPTOGLOBIN: Haptoglobin: 141 mg/dL (ref 45–215)

## 2012-11-26 LAB — PROTIME-INR
INR: 1.57 — ABNORMAL HIGH (ref 0.00–1.49)
Prothrombin Time: 18.3 seconds — ABNORMAL HIGH (ref 11.6–15.2)

## 2012-11-26 LAB — LACTIC ACID, PLASMA: Lactic Acid, Venous: 2.9 mmol/L — ABNORMAL HIGH (ref 0.5–2.2)

## 2012-11-26 LAB — LIPASE, BLOOD: Lipase: 203 U/L — ABNORMAL HIGH (ref 11–59)

## 2012-11-26 NOTE — Progress Notes (Signed)
NURSING PROGRESS NOTE  Danny Arnold [id number] Transfer Data: 11/26/2012 2:07 PM Attending Provider: Calvert Cantor, MD JYN:[id number], Keane Scrape, MD Code Status: full  Danny Arnold is a 37 y.o. male patient transferred from 2C -No acute distress noted.  -No complaints of shortness of breath.  -No complaints of chest pain.    Blood pressure 111/68, pulse 76, temperature 97.8 F (36.6 C), temperature source Oral, resp. rate 25, height 5\' 7"  (1.702 m), weight 68.5 kg (151 lb 0.2 oz), SpO2 100.00%.   IV Fluids:  IV in place, occlusive dsg intact without redness, IV cath forearm left, and left AC condition patent and no redness.   Allergies:  Review of patient's allergies indicates no known allergies.  Past Medical History:   has a past medical history of Jaundice; Hyperbilirubinemia; Anemia; Alcoholic liver disease; Bacteremia; Cholangitis; Hyponatremia; Leucocytosis; and Alcoholic hepatitis.  Past Surgical History:   has past surgical history that includes Hand surgery.  Social History:   reports that he has never smoked. He does not have any smokeless tobacco history on file. He reports that he drinks alcohol. He reports that he does not use illicit drugs.  Skin: jaundice   Patient/Family orientated to room. Information packet given to patient/family. Admission inpatient armband information verified with patient/family to include name and date of birth and placed on patient arm. Side rails up x 2, fall assessment and education completed with patient/family. Patient/family able to verbalize understanding of risk associated with falls and verbalized understanding to call for assistance before getting out of bed. Call light within reach. Patient/family able to voice and demonstrate understanding of unit orientation instructions.    Will continue to evaluate and treat per MD orders.

## 2012-11-26 NOTE — ED Provider Notes (Signed)
Medical screening examination/treatment/procedure(s) were conducted as a shared visit with non-physician practitioner(s) and myself.  I personally evaluated the patient during the encounter  Pt with known liver disease now with GI bleeding and significant anemia. Hemodynamically stable. Admit for transfusion.   Charles B. Bernette Mayers, MD 11/26/12 [phone]

## 2012-11-26 NOTE — Progress Notes (Signed)
[facility] Daily Rounding Note 11/26/2012, 8:23 AM  SUBJECTIVE:       No nausea, no abdominal pain.  No difficulty breathing  OBJECTIVE:         Vital signs in last 24 hours:    Temp:  [97.6 F (36.4 C)-98.8 F (37.1 C)] 98.4 F (36.9 C) (10/14 0300) Pulse Rate:  [74-136] 82 (10/14 0300) Resp:  [11-23] 13 (10/14 0300) BP: (87-114)/(41-74) 91/45 mmHg (10/14 0300) SpO2:  [98 %-100 %] 100 % (10/14 0300)   General: looks better.  comfortable   Heart: RRR Chest: clear.  No SOB or cough Abdomen: soft, ND, active BS.  NT  Extremities: no CCE Neuro/Psych:  Pleasant, alert, no gross deficits.  No tremor or asterixis.   Intake/Output from previous day: 10/13 0701 - 10/14 0700 In: 4600.4 [P.O.:4060; I.V.:540.4] Out: 451 [Urine:450; Stool:1]  Intake/Output this shift:    Lab Results:  Recent Labs  11/25/12 0542 11/25/12 1857 11/26/12 0500  WBC 30.5* 18.9* 20.1*  HGB 7.5* 6.4* 7.5*  HCT 20.8* 18.5* 20.9*  PLT 371 294 309   BMET  Recent Labs  2012-12-06 1235 11/25/12 0542 11/26/12 0500  NA 124* 129* 131*  K 3.8 3.2* 3.6  CL 100 104 107  CO2 9* 10* 11*  GLUCOSE 117* 95 109*  BUN 18 16 16   CREATININE 1.15 1.06 0.97  CALCIUM 7.3* 8.0* 7.9*   LFT  Recent Labs  2012/12/06 1235 11/25/12 0542 11/26/12 0500  PROT 5.7* 6.0 5.6*  ALBUMIN 1.3* 1.5* 1.4*  AST 162* 250* 246*  ALT 62* 74* 87*  ALKPHOS 227* 229* 203*  BILITOT 18.2* 19.0* 16.9*   PT/INR  Recent Labs  12/06/12 1235 11/26/12 0500  LABPROT 20.0* 18.3*  INR 1.76* 1.57*    Studies/Results: Dg Chest 2 View 12-06-2012   CLINICAL DATA:  Shortness of breath, rectal bleeding.  EXAM: CHEST  2 VIEW  COMPARISON:  October 27, 2012.  FINDINGS: The heart size and mediastinal contours are within normal limits. Both lungs are clear. The visualized skeletal structures are unremarkable.  IMPRESSION: No active cardiopulmonary disease.   Electronically Signed   By: Roque Lias M.D.   On: 12-06-12 14:48   US  Abdomen Complete 2012/12/06     IMPRESSION: Gallbladder wall thickening similar to priors. Sludge ball (likely) versus non shadowing stone not seen previously.  Echogenic and heterogeneous liver, question hepatitis versus steatosis.  No visible ascites.   Electronically Signed   By: Davonna Belling M.D.   On: 2012/12/06 20:48    ASSESMENT: * Alcoholic liver disease, slow to clear alcoholic hepatitis. Early cirrhosis. Initial presentation 10/28/12. Hepatitis ABC, HIV all negative 10/2012. Total Bili improving. AST/ALT rising. Off propanolol and aldactone, lasix: all added last admission.  GB sludge vs stone on [place].  * Macrocytic anemia. Hgb nadir of 4.8 on 12-06-2012. S/p 3 units PRBCs this admission. Got 2 units during Sept admission.  * Rectal bleeding Colonoscopy 10/13: internal hemorrhoids with no bleeding, Anusol HC suppository added 10/13. EGD 10/13: single band of grade 1 esophageal varices were not bleeding and no stigmata of bleeding.  Varices not banded.    * Leukocytosis without fever. Present since 10/27/12.  Improved in last 24 hours.   No visible ascites on imaging so unlikely he has SBP. No evidence for UTI or PNA. Empiric Claforan discontinued 11/25/12. Citrobacter bacteremia during 9/14-9/26 admission, discharged on Cipro.  Repeat U/A and culture neg for UTI .  * Coagulopathy with elevated  Protime since 10/2012:  improved.  * Hypokalemia, resolved * Hyponatremia, chronic  * Elevated Lipase: improving, no current ultrasound and no previous CT evidence of pancreatitis.     PLAN: *  Await ANA, AMA, ceruloplasmin, A-1AT:  Rule out non alcoholic causes of liver disease.  *  Would not restart Lasix, Aldactone given lack of ascites on ultrasound. The varices are low risk for bleeding, Dr Juanda Chance does not feel Propanolol is indicated. 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  *  Regular diet.  Ok to go home from GI standpoint.   *  Reminded  pt to stop all ETOH.   *  Has [facility] follow up on 12/12/12.  Can get CBC and CMET then.  *  Anusol PR Prn for bleeding and stool softeners or laxatives as needed.    LOS: 2 days   Jennye Moccasin  11/26/2012, 8:23 AM Pager:  Attending MD note:   I have reviewed the above note, examined the patient with Ms Clarita Leber PA and agree with plan of treatment. Small esophageal varices ,low risk for bleeding. Slowly resolving alcoholic hepatitis.  Willa Rough [facility] Pager # [phone]

## 2012-11-26 NOTE — Progress Notes (Signed)
[facility] Progress Note [facility] TEAM 1 - Stepdown ICU Team   [name] ZOX:[id number] DOB: [birth year] DOA: 11/24/2012 PCP: [name], MD  Brief narrative: 37 year old male patient with known alcoholic hepatitis with underlying cirrhosis, baseline total bilirubin around 20. Initially admitted in September of this year with acute jaundice due to same problems. Evaluated by Gastroenterology during that admission and complete alcohol cessation was recommended. Apparently had been in usual state of health until the date of admission when he developed massive rectal bleeding. The patient reported he sat on the toilet for around 1.5 hours but when attempted to stand up he became very dizzy and lightheaded but did not have a syncopal episode. He had some mild abdominal discomfort but no constitutional symptoms. In the ER he was hyponatremic which is a chronic problem for him. He had metabolic acidosis with a bicarbonate of 9. Anion gap 15. Total bilirubin around 18.2. Lipase mildly elevated at 256. Hemoglobin was markedly decreased at 4.8 and his white count was markedly increased at 47,000.  Assessment/Plan:    Cirrhosis, alcoholic/Portal hypertension -Octreotide dc'd by GI -GI endorsed from GI standpoint OK for dc (see below) -TB stable -Hepatitis panel negative last admit -ANA, AMA, cerulopasmin and alpha-1 anti trypsin pending    GI bleed/hematochezia -upper/lower endoscopy revealed only a few internal hemorrhoids and non bleeding varices- bleeding not well explained -no further visible bleeding since admission but hgb drifted down overnight to <7 and required an add'l unit of PRBCs -FU on H. Pylori bx's    Acute blood loss anemia/macrocytic -hgb up after PRBCs x 5 (5th unit overnight) -?? Hemolysis- check LDH/Haptoglobin- if positive will need to consult heme -check smear and differential -follow CBC   Metabolic acidosis -?? Etiology -ABG PCO2 and bicarb low but  pH normal which is suggestive of a compensated metabolic acidosis- lactic acid 2.9- repeat in am -no current abdominal pain so doubt mesenteric ischemia and also no ischemic findings on colonoscopy    Hyponatremia -chronic problem is stable    Leukocytosis -trend down - no fever - likely leukomoid rxn -had leukocytosis last admit due to citrobacter bacteremia- no fever and has been on empiric anbx's-blood cx's from 10/12 NGTD -may need heme eval for this as well    Abdominal pain -doubt SBP but will cont empiric coverage as precaution given leukocytosis -has resolved     DVT prophylaxis: SCDs Code Status: Full Family Communication: Patient only- conversation limited due to patient's minimal understanding of English-ultimately needed to use interpretor Disposition Plan/Expected LOS: Transfer to floor  Consultants: Gastroenterology  Procedures: EGD  ENDOSCOPIC IMPRESSION:  first grade esophageal varices. One of straining. No stigmata of  bleeding  No blood in the stomach or duodenum  Mild-to-moderate duodenitis. Status post biopsies for H. pylori.  There is nothing to account for patient's GI blood loss  Colonoscopy  ENDOSCOPIC IMPRESSION:  Moderate sized internal hemorrhoids  no blood in colon  No other lesions to explain severe anemia   Antibiotics: Cefotaxime 10/12 >>>  HPI/Subjective: Patient alert and denies abd pain, N/V/D, SOB or CP.  Objective: Blood pressure 106/61, pulse 79, temperature 97.2 F (36.2 C), temperature source Oral, resp. rate 25, height 5\' 7"  (1.702 m), weight 68.5 kg (151 lb 0.2 oz), SpO2 100.00%.  Intake/Output Summary (Last 24 hours) at 11/26/12 1231 Last data filed at 11/26/12 0800  Gross per 24 hour  Intake    300 ml  Output    700 ml  Net   -400  ml   Exam: General: No acute respiratory distress-jaundiced Lungs: Clear to auscultation bilaterally without wheezes or crackles, RA Cardiovascular: Regular rate and rhythm without murmur  gallop or rub normal S1 and S2, no peripheral edema or JVD Abdomen: Nontender, slightly distended, soft, bowel sounds positive, no rebound, no ascites, no appreciable mass Musculoskeletal: No significant cyanosis, clubbing of bilateral lower extremities Neurological: Alert, moves all extremities x 4 without focal neurological deficits, CN 2-12 intact  Scheduled Meds:  Scheduled Meds: . docusate sodium  200 mg Oral BID  . feeding supplement (RESOURCE BREEZE)  1 Container Oral TID BM  . folic acid  1 mg Oral Daily  . hydrocortisone  25 mg Rectal BID  . multivitamin with minerals  1 tablet Oral Daily  . pantoprazole  40 mg Oral Q0600  . potassium chloride SA  40 mEq Oral Daily  . sodium bicarbonate  1,300 mg Oral BID  . thiamine  100 mg Oral Daily   Continuous Infusions: . sodium chloride    . sodium chloride 0.9 % 1,000 mL with potassium chloride 20 mEq infusion 150 mL/hr at 11/25/12 0915    Data Reviewed: Basic Metabolic Panel:  Recent Labs Lab 11/24/12 1235 11/25/12 0542 11/26/12 0500  NA 124* 129* 131*  K 3.8 3.2* 3.6  CL 100 104 107  CO2 9* 10* 11*  GLUCOSE 117* 95 109*  BUN 18 16 16   CREATININE 1.15 1.06 0.97  CALCIUM 7.3* 8.0* 7.9*   Liver Function Tests:  Recent Labs Lab 11/24/12 1235 11/25/12 0542 11/26/12 0500  AST 162* 250* 246*  ALT 62* 74* 87*  ALKPHOS 227* 229* 203*  BILITOT 18.2* 19.0* 16.9*  PROT 5.7* 6.0 5.6*  ALBUMIN 1.3* 1.5* 1.4*    Recent Labs Lab 11/24/12 1235 11/25/12 0542 11/26/12 0500  LIPASE 256* 486* 203*  AMYLASE  --  250*  --     Recent Labs Lab 11/24/12 1235  AMMONIA 18   CBC:  Recent Labs Lab 11/24/12 1235 11/25/12 0542 11/25/12 1857 11/26/12 0500  WBC 47.0* 30.5* 18.9* 20.1*  NEUTROABS 40.3*  --   --   --   HGB 4.8* 7.5* 6.4* 7.5*  HCT 13.3* 20.8* 18.5* 20.9*  MCV 95.0 88.9 88.5 91.3  PLT 401* 371 294 309    Recent Results (from the past 240 hour(s))  CULTURE, BLOOD (ROUTINE X 2)     Status: None    Collection Time    11/24/12  2:10 PM      Result Value Range Status   Specimen Description BLOOD RIGHT ARM   Final   Special Requests     Final   Value: BOTTLES DRAWN AEROBIC AND ANAEROBIC 10CC BLUE 5CCRED   Culture  Setup Time     Final   Value: 11/24/2012 22:17     Performed at [facility]   Culture     Final   Value:        BLOOD CULTURE RECEIVED NO GROWTH TO DATE CULTURE WILL BE HELD FOR 5 DAYS BEFORE ISSUING A FINAL NEGATIVE REPORT     Performed at [facility]   Report Status PENDING   Incomplete  CULTURE, BLOOD (ROUTINE X 2)     Status: None   Collection Time    11/24/12  2:20 PM      Result Value Range Status   Specimen Description BLOOD RIGHT HAND   Final   Special Requests BOTTLES DRAWN AEROBIC ONLY 10CC   Final   Culture  Setup Time     Final   Value: 11/24/2012 22:17     Performed at [facility]   Culture     Final   Value:        BLOOD CULTURE RECEIVED NO GROWTH TO DATE CULTURE WILL BE HELD FOR 5 DAYS BEFORE ISSUING A FINAL NEGATIVE REPORT     Performed at [facility]   Report Status PENDING   Incomplete  URINE CULTURE     Status: None   Collection Time    11/24/12  3:10 PM      Result Value Range Status   Specimen Description URINE, CLEAN CATCH   Final   Special Requests NONE   Final   Culture  Setup Time     Final   Value: 11/24/2012 16:42     Performed at [facility] Count     Final   Value: NO GROWTH     Performed at [facility]   Culture     Final   Value: NO GROWTH     Performed at [facility]   Report Status 11/25/2012 FINAL   Final  MRSA PCR SCREENING     Status: None   Collection Time    11/24/12  6:33 PM      Result Value Range Status   MRSA by PCR NEGATIVE  NEGATIVE Final   Comment:            The GeneXpert MRSA Assay (FDA     approved for NASAL specimens     only), is one component of a     comprehensive MRSA colonization     surveillance program. It is not     intended  to diagnose MRSA     infection nor to guide or     monitor treatment for     MRSA infections.     Studies:  Recent x-ray studies have been reviewed in detail by the Attending Physician     Junious Silk, ANP [facility] Office  [phone] Pager [phone]  **If unable to reach the above provider after paging please contact the Flow Manager @ [phone]  On-Call/Text Page:      [email or web address]      password [email or web address]  If 7PM-7AM, please contact night-coverage [email or web address] Password [facility] 11/26/2012, 12:31 PM   LOS: 2 days    I have examined the patient, reviewed the chart and modified the above note which I agree with.   Rowan Pollman,MD [phone] 11/26/2012, 5:41 PM

## 2012-11-27 ENCOUNTER — Encounter (HOSPITAL_COMMUNITY): Payer: Self-pay | Admitting: Internal Medicine

## 2012-11-27 LAB — COMPREHENSIVE METABOLIC PANEL
ALT: 76 U/L — ABNORMAL HIGH (ref 0–53)
AST: 165 U/L — ABNORMAL HIGH (ref 0–37)
Albumin: 1.3 g/dL — ABNORMAL LOW (ref 3.5–5.2)
Chloride: 109 mEq/L (ref 96–112)
Creatinine, Ser: 1.01 mg/dL (ref 0.50–1.35)
GFR calc non Af Amer: >90 mL/min (ref >90)
Potassium: 3.4 mEq/L — ABNORMAL LOW (ref 3.5–5.1)
Total Bilirubin: 15.6 mg/dL — ABNORMAL HIGH (ref 0.3–1.2)

## 2012-11-27 LAB — CBC WITH DIFFERENTIAL/PLATELET
Basophils Absolute: 0.1 10*3/uL (ref 0.0–0.1)
Basophils Relative: 1 % (ref 0–1)
Lymphocytes Relative: 11 % — ABNORMAL LOW (ref 12–46)
Lymphs Abs: 2.2 10*3/uL (ref 0.7–4.0)
MCHC: 36.7 g/dL — ABNORMAL HIGH (ref 30.0–36.0)
Neutro Abs: 15.1 10*3/uL — ABNORMAL HIGH (ref 1.7–7.7)
Neutrophils Relative %: 76 % (ref 43–77)
RBC: 2.06 MIL/uL — ABNORMAL LOW (ref 4.22–5.81)
RDW: 18 % — ABNORMAL HIGH (ref 11.5–15.5)

## 2012-11-27 LAB — ANA: Anti Nuclear Antibody(ANA): NEGATIVE

## 2012-11-27 LAB — HEMOGLOBIN AND HEMATOCRIT, BLOOD
HCT: 22.5 % — ABNORMAL LOW (ref 39.0–52.0)
Hemoglobin: 7.9 g/dL — ABNORMAL LOW (ref 13.0–17.0)

## 2012-11-27 MED ORDER — DIPHENHYDRAMINE HCL 25 MG PO CAPS
25.0000 mg | ORAL_CAPSULE | Freq: Four times a day (QID) | ORAL | Status: DC | PRN
  Administered 2012-11-27: 25 mg via ORAL
  Filled 2012-11-27: qty 1

## 2012-11-27 NOTE — Progress Notes (Addendum)
CRITICAL VALUE ALERT  Critical value received:  Hemoglobin 6.9  Date of notification: 11/27/12  Time of notification:0735 Critical value read back:yes  Nurse who received alert:  Rosalie Doctor rn   MD notified (1st page):  Dr. Sharon Seller Time of first page:  [phone]  MD notified (2nd page): Dr. Sharon Seller  Time of second page: 0806  Responding MD:    Time MD responded:   RN made aware wrong MD posted in aminon - correct MD paged [phone].

## 2012-11-27 NOTE — Progress Notes (Signed)
[facility] Progress Note   Danny Arnold JXB:[id number] DOB: 1975/10/09 DOA: 11/24/2012 PCP: [name], MD  Brief narrative: 37 year old male patient with known alcoholic hepatitis with underlying cirrhosis, baseline total bilirubin around 20. Initially admitted in September of this year with acute jaundice due to same problems. Evaluated by Gastroenterology during that admission and complete alcohol cessation was recommended. Apparently had been in usual state of health until the date of admission when he developed massive rectal bleeding. The patient reported he sat on the toilet for around 1.5 hours but when attempted to stand up he became very dizzy and lightheaded but did not have a syncopal episode. He had some mild abdominal discomfort but no constitutional symptoms. In the ER he was hyponatremic which is a chronic problem for him. He had metabolic acidosis with a bicarbonate of 9. Anion gap 15. Total bilirubin around 18.2. Lipase mildly elevated at 256. Hemoglobin was markedly decreased at 4.8 and his white count was markedly increased at 47,000.  Assessment/Plan:    Cirrhosis, alcoholic/Portal hypertension -Octreotide dc'd by GI -GI endorsed from GI standpoint OK for dc (see below) -TB stable -Hepatitis panel negative last admit -ANA, AMA, cerulopasmin and alpha-1 anti trypsin pending    GI bleed/hematochezia -upper/lower endoscopy revealed only a few internal hemorrhoids and non bleeding varices- bleeding not well explained -no further visible bleeding since admission but hgb drifted down overnight to <7 and required an add'l unit of PRBCs -FU on H. Pylori bx's    Acute blood loss anemia/macrocytic -hgb up after PRBCs x 5 (5th unit overnight) -?? Hemolysis- check LDH/Haptoglobin- if positive will need to consult heme -check smear and differential -follow CBC   Metabolic acidosis -?? Etiology -ABG PCO2 and bicarb low but pH normal which is suggestive of a  compensated metabolic acidosis- lactic acid 2.9- repeat in am -no current abdominal pain so doubt mesenteric ischemia and also no ischemic findings on colonoscopy    Hyponatremia -chronic problem is stable    Leukocytosis -trend down - no fever - likely leukomoid rxn -had leukocytosis last admit due to citrobacter bacteremia- no fever and has been on empiric anbx's-blood cx's from 10/12 NGTD -may need heme eval for this as well    Abdominal pain -doubt SBP but will cont empiric coverage as precaution given leukocytosis -has resolved     DVT prophylaxis: SCDs Code Status: Full Family Communication: Patient only- conversation limited due to patient's minimal understanding of English-ultimately needed to use interpretor Disposition Plan/Expected LOS: Transfer to floor  Consultants: Gastroenterology  Procedures: EGD  ENDOSCOPIC IMPRESSION:  first grade esophageal varices. One of straining. No stigmata of  bleeding  No blood in the stomach or duodenum  Mild-to-moderate duodenitis. Status post biopsies for H. pylori.  There is nothing to account for patient's GI blood loss  Colonoscopy  ENDOSCOPIC IMPRESSION:  Moderate sized internal hemorrhoids  no blood in colon  No other lesions to explain severe anemia   Antibiotics: Cefotaxime 10/12 >>>  HPI/Subjective: Patient alert and denies abd pain, N/V/D, SOB or CP.  Objective: Blood pressure 124/73, pulse 102, temperature 98.6 F (37 C), temperature source Oral, resp. rate 18, height 5\' 7"  (1.702 m), weight 68.5 kg (151 lb 0.2 oz), SpO2 100.00%.  Intake/Output Summary (Last 24 hours) at 11/27/12 1626 Last data filed at 11/27/12 1240  Gross per 24 hour  Intake    765 ml  Output      0 ml  Net    765 ml   Exam: General:  No acute respiratory distress-jaundiced Lungs: Clear to auscultation bilaterally without wheezes or crackles, RA Cardiovascular: Regular rate and rhythm without murmur gallop or rub normal S1 and S2, no  peripheral edema or JVD Abdomen: Nontender, slightly distended, soft, bowel sounds positive, no rebound, no ascites, no appreciable mass Musculoskeletal: No significant cyanosis, clubbing of bilateral lower extremities Neurological: Alert, moves all extremities x 4 without focal neurological deficits, CN 2-12 intact  Scheduled Meds:  Scheduled Meds: . docusate sodium  200 mg Oral BID  . feeding supplement (RESOURCE BREEZE)  1 Container Oral TID BM  . folic acid  1 mg Oral Daily  . hydrocortisone  25 mg Rectal BID  . multivitamin with minerals  1 tablet Oral Daily  . pantoprazole  40 mg Oral Q0600  . potassium chloride SA  40 mEq Oral Daily  . sodium bicarbonate  1,300 mg Oral BID  . thiamine  100 mg Oral Daily   Continuous Infusions: . sodium chloride 0.9 % 1,000 mL with potassium chloride 20 mEq infusion 150 mL/hr at 11/25/12 0915    Data Reviewed: Basic Metabolic Panel:  Recent Labs Lab 11/24/12 1235 11/25/12 0542 11/26/12 0500 11/27/12 0527  NA 124* 129* 131* 133*  K 3.8 3.2* 3.6 3.4*  CL 100 104 107 109  CO2 9* 10* 11* 13*  GLUCOSE 117* 95 109* 107*  BUN 18 16 16 14   CREATININE 1.15 1.06 0.97 1.01  CALCIUM 7.3* 8.0* 7.9* 7.7*   Liver Function Tests:  Recent Labs Lab 11/24/12 1235 11/25/12 0542 11/26/12 0500 11/27/12 0527  AST 162* 250* 246* 165*  ALT 62* 74* 87* 76*  ALKPHOS 227* 229* 203* 186*  BILITOT 18.2* 19.0* 16.9* 15.6*  PROT 5.7* 6.0 5.6* 5.4*  ALBUMIN 1.3* 1.5* 1.4* 1.3*    Recent Labs Lab 11/24/12 1235 11/25/12 0542 11/26/12 0500  LIPASE 256* 486* 203*  AMYLASE  --  250*  --     Recent Labs Lab 11/24/12 1235  AMMONIA 18   CBC:  Recent Labs Lab 11/24/12 1235 11/25/12 0542 11/25/12 1857 11/26/12 0500 11/27/12 0527  WBC 47.0* 30.5* 18.9* 20.1* 20.0*  NEUTROABS 40.3*  --   --   --  15.1*  HGB 4.8* 7.5* 6.4* 7.5* 6.9*  HCT 13.3* 20.8* 18.5* 20.9* 18.8*  MCV 95.0 88.9 88.5 91.3 91.3  PLT 401* 371 294 309 325    Recent  Results (from the past 240 hour(s))  CULTURE, BLOOD (ROUTINE X 2)     Status: None   Collection Time    11/24/12  2:10 PM      Result Value Range Status   Specimen Description BLOOD RIGHT ARM   Final   Special Requests     Final   Value: BOTTLES DRAWN AEROBIC AND ANAEROBIC 10CC BLUE 5CCRED   Culture  Setup Time     Final   Value: 11/24/2012 22:17     Performed at [facility]   Culture     Final   Value:        BLOOD CULTURE RECEIVED NO GROWTH TO DATE CULTURE WILL BE HELD FOR 5 DAYS BEFORE ISSUING A FINAL NEGATIVE REPORT     Performed at [facility]   Report Status PENDING   Incomplete  CULTURE, BLOOD (ROUTINE X 2)     Status: None   Collection Time    11/24/12  2:20 PM      Result Value Range Status   Specimen Description BLOOD RIGHT HAND   Final  Special Requests BOTTLES DRAWN AEROBIC ONLY 10CC   Final   Culture  Setup Time     Final   Value: 11/24/2012 22:17     Performed at [facility]   Culture     Final   Value:        BLOOD CULTURE RECEIVED NO GROWTH TO DATE CULTURE WILL BE HELD FOR 5 DAYS BEFORE ISSUING A FINAL NEGATIVE REPORT     Performed at [facility]   Report Status PENDING   Incomplete  URINE CULTURE     Status: None   Collection Time    11/24/12  3:10 PM      Result Value Range Status   Specimen Description URINE, CLEAN CATCH   Final   Special Requests NONE   Final   Culture  Setup Time     Final   Value: 11/24/2012 16:42     Performed at [facility] Count     Final   Value: NO GROWTH     Performed at [facility]   Culture     Final   Value: NO GROWTH     Performed at [facility]   Report Status 11/25/2012 FINAL   Final  MRSA PCR SCREENING     Status: None   Collection Time    11/24/12  6:33 PM      Result Value Range Status   MRSA by PCR NEGATIVE  NEGATIVE Final   Comment:            The GeneXpert MRSA Assay (FDA     approved for NASAL specimens     only), is one component of a      comprehensive MRSA colonization     surveillance program. It is not     intended to diagnose MRSA     infection nor to guide or     monitor treatment for     MRSA infections.    Naiyana Barbian,MD [phone] 11/27/2012, 4:26 PM    On-Call/Text Page:      [email or web address]      password [email or web address]  If 7PM-7AM, please contact night-coverage [email or web address] Password [email or web address] 11/27/2012, 4:26 PM   LOS: 3 days

## 2012-11-27 NOTE — Progress Notes (Signed)
NUTRITION FOLLOW-UP  DOCUMENTATION CODES Per approved criteria  -Not Applicable   INTERVENTION: Continue current interventions; RD will monitor oral intake and add nutrition supplements as needed.   NUTRITION DIAGNOSIS: Inadequate oral intake related to inability to eat as evidenced by NPO status. Resolved.  Goal: Patient will meet >/=90% of estimated nutrition needs -met.  Monitor:  PO intake, weights, labs  ASSESSMENT: Patient with history of alcoholic hepatitis, alcohol abuse, with recent admission last month with jaundice. He was admitted with rectal bleeding. Patient does not speak [place].   Underwent endoscopy on 10/13, biopsies were taken for H. Pylori.  Advanced to Regular diet on 10/14. Eating 100% of meals.  Height: Ht Readings from Last 1 Encounters:  11/24/12 5\' 7"  (1.702 m)    Weight: Wt Readings from Last 1 Encounters:  11/25/12 151 lb 0.2 oz (68.5 kg)  Admit wt 163 lb   BMI:  Body mass index is 23.65 kg/(m^2). Normal weight.   Estimated Nutritional Needs: Kcal: 1750-1950 kcal Protein: 70-85 g Fluid: 2.4 L/day  Skin: Intact  Diet Order: General  EDUCATION NEEDS: -No education needs identified at this time   Intake/Output Summary (Last 24 hours) at 11/27/12 0834 Last data filed at 11/26/12 1844  Gross per 24 hour  Intake    240 ml  Output      0 ml  Net    240 ml    Last BM: 10/14  Labs:   Recent Labs Lab 11/25/12 0542 11/26/12 0500 11/27/12 0527  NA 129* 131* 133*  K 3.2* 3.6 3.4*  CL 104 107 109  CO2 10* 11* 13*  BUN 16 16 14   CREATININE 1.06 0.97 1.01  CALCIUM 8.0* 7.9* 7.7*  GLUCOSE 95 109* 107*    CBG (last 3)  No results found for this basename: GLUCAP,  in the last 72 hours  Scheduled Meds: . docusate sodium  200 mg Oral BID  . feeding supplement (RESOURCE BREEZE)  1 Container Oral TID BM  . folic acid  1 mg Oral Daily  . hydrocortisone  25 mg Rectal BID  . multivitamin with minerals  1 tablet Oral Daily  .  pantoprazole  40 mg Oral Q0600  . potassium chloride SA  40 mEq Oral Daily  . sodium bicarbonate  1,300 mg Oral BID  . thiamine  100 mg Oral Daily    Continuous Infusions: . sodium chloride 0.9 % 1,000 mL with potassium chloride 20 mEq infusion 150 mL/hr at 11/25/12 0915    Jarold Motto MS, RD, LDN Pager: [phone] After-hours pager: [phone]

## 2012-11-28 LAB — TYPE AND SCREEN
ABO/RH(D): O POS
Unit division: 0
Unit division: 0
Unit division: 0
Unit division: 0

## 2012-11-28 LAB — CBC
HCT: 22.9 % — ABNORMAL LOW (ref 39.0–52.0)
MCV: 86.4 fL (ref 78.0–100.0)
Platelets: 285 10*3/uL (ref 150–400)
RBC: 2.65 MIL/uL — ABNORMAL LOW (ref 4.22–5.81)
RDW: 18.3 % — ABNORMAL HIGH (ref 11.5–15.5)
WBC: 17.6 10*3/uL — ABNORMAL HIGH (ref 4.0–10.5)

## 2012-11-28 LAB — HEMOGLOBIN AND HEMATOCRIT, BLOOD
HCT: 22.2 % — ABNORMAL LOW (ref 39.0–52.0)
Hemoglobin: 7.9 g/dL — ABNORMAL LOW (ref 13.0–17.0)

## 2012-11-28 LAB — BASIC METABOLIC PANEL
CO2: 13 mEq/L — ABNORMAL LOW (ref 19–32)
Calcium: 7.8 mg/dL — ABNORMAL LOW (ref 8.4–10.5)
Chloride: 105 mEq/L (ref 96–112)
GFR calc Af Amer: >90 mL/min (ref >90)
Glucose, Bld: 154 mg/dL — ABNORMAL HIGH (ref 70–99)
Sodium: 130 mEq/L — ABNORMAL LOW (ref 135–145)

## 2012-11-28 LAB — HEPATIC FUNCTION PANEL
ALT: 62 U/L — ABNORMAL HIGH (ref 0–53)
Alkaline Phosphatase: 194 U/L — ABNORMAL HIGH (ref 39–117)
Bilirubin, Direct: 13 mg/dL — ABNORMAL HIGH (ref 0.0–0.3)
Indirect Bilirubin: 3.7 mg/dL — ABNORMAL HIGH (ref 0.3–0.9)
Total Bilirubin: 16.7 mg/dL — ABNORMAL HIGH (ref 0.3–1.2)
Total Protein: 5.5 g/dL — ABNORMAL LOW (ref 6.0–8.3)

## 2012-11-28 LAB — LACTIC ACID, PLASMA: Lactic Acid, Venous: 2.1 mmol/L (ref 0.5–2.2)

## 2012-11-28 LAB — MITOCHONDRIAL ANTIBODIES: Mitochondrial M2 Ab, IgG: 0.87 (ref <0.91)

## 2012-11-28 MED ORDER — POTASSIUM CHLORIDE CRYS ER 20 MEQ PO TBCR
40.0000 meq | EXTENDED_RELEASE_TABLET | Freq: Two times a day (BID) | ORAL | Status: DC
  Administered 2012-11-28 – 2012-12-02 (×8): 40 meq via ORAL
  Filled 2012-11-28 (×10): qty 2

## 2012-11-28 NOTE — Progress Notes (Signed)
[facility] Progress Note   [name] NWG:[id number] DOB: 1975-08-29 DOA: 11/24/2012 PCP: [name], MD  Brief narrative: 37 year old male patient with known alcoholic hepatitis with underlying cirrhosis, baseline total bilirubin around 20. Initially admitted in September of this year with acute jaundice due to same problems. Evaluated by Gastroenterology during that admission and complete alcohol cessation was recommended. Apparently had been in usual state of health until the date of admission when he developed massive rectal bleeding. The patient reported he sat on the toilet for around 1.5 hours but when attempted to stand up he became very dizzy and lightheaded but did not have a syncopal episode. He had some mild abdominal discomfort but no constitutional symptoms. In the ER he was hyponatremic which is a chronic problem for him. He had metabolic acidosis with a bicarbonate of 9. Anion gap 15. Total bilirubin around 18.2. Lipase mildly elevated at 256. Hemoglobin was markedly decreased at 4.8 and his white count was markedly increased at 47,000.  Assessment/Plan:    Cirrhosis, alcoholic/Portal hypertension -Octreotide dc'd by GI -GI endorsed from GI standpoint OK for dc (see below) -TB stable -Hepatitis panel negative last admit -ANA, AMA, cerulopasmin and alpha-1 anti trypsin negative so far.     GI bleed/hematochezia -upper/lower endoscopy revealed only a few internal hemorrhoids and non bleeding varices- bleeding not well explained -no further visible bleeding since admission but hgb drifted down overnight to <7 and required an add'l unit of PRBCs -negative H. Pylori bx's    Acute blood loss anemia/macrocytic -hgb up after PRBCs transfusions.  -?? Hemolysis- check LDH/Haptoglobin-negative.  -follow CBC   Metabolic acidosis -?? Etiology -ABG PCO2 and bicarb low but pH normal which is suggestive of a compensated metabolic acidosis- lactic acid 2.9- repeat in  am shows improvement.  -no current abdominal pain so doubt mesenteric ischemia and also no ischemic findings on colonoscopy    Hyponatremia -chronic problem is stable    Leukocytosis -trend down - no fever - likely leukomoid rxn -had leukocytosis last admit due to citrobacter bacteremia- no fever and has been on empiric anbx's-blood cx's from 10/12 NGTD -smear showed lymphocytosis and neutrophilia, possibly a reactive process.     Abdominal pain -doubt SBP but will cont empiric coverage as precaution given leukocytosis -has resolved     DVT prophylaxis: SCDs Code Status: Full Family Communication: Patient only- conversation limited due to patient's minimal understanding of English-ultimately needed to use interpretor Disposition Plan/Expected LOS: possibly in am.   Consultants: Gastroenterology  Procedures: EGD  ENDOSCOPIC IMPRESSION:  first grade esophageal varices. One of straining. No stigmata of  bleeding  No blood in the stomach or duodenum  Mild-to-moderate duodenitis. Status post biopsies for H. pylori.  There is nothing to account for patient's GI blood loss  Colonoscopy  ENDOSCOPIC IMPRESSION:  Moderate sized internal hemorrhoids  no blood in colon  No other lesions to explain severe anemia   Antibiotics: Cefotaxime 10/12 >>>  HPI/Subjective: Patient alert and denies abd pain, N/V/D, SOB or CP.  Objective: Blood pressure 113/64, pulse 94, temperature 98.2 F (36.8 C), temperature source Oral, resp. rate 16, height 5\' 7"  (1.702 m), weight 68.5 kg (151 lb 0.2 oz), SpO2 100.00%.  Intake/Output Summary (Last 24 hours) at 11/28/12 1540 Last data filed at 11/28/12 1300  Gross per 24 hour  Intake    360 ml  Output      3 ml  Net    357 ml   Exam: General: No acute respiratory distress-jaundiced Lungs:  Clear to auscultation bilaterally without wheezes or crackles, RA Cardiovascular: Regular rate and rhythm without murmur gallop or rub normal S1 and S2, no  peripheral edema or JVD Abdomen: Nontender, slightly distended, soft, bowel sounds positive, no rebound, no ascites, no appreciable mass Musculoskeletal: No significant cyanosis, clubbing of bilateral lower extremities Neurological: Alert, moves all extremities x 4 without focal neurological deficits, CN 2-12 intact  Scheduled Meds:  Scheduled Meds: . docusate sodium  200 mg Oral BID  . feeding supplement (RESOURCE BREEZE)  1 Container Oral TID BM  . folic acid  1 mg Oral Daily  . hydrocortisone  25 mg Rectal BID  . multivitamin with minerals  1 tablet Oral Daily  . pantoprazole  40 mg Oral Q0600  . potassium chloride SA  40 mEq Oral BID  . sodium bicarbonate  1,300 mg Oral BID  . thiamine  100 mg Oral Daily   Continuous Infusions:    Data Reviewed: Basic Metabolic Panel:  Recent Labs Lab 11/24/12 1235 11/25/12 0542 11/26/12 0500 11/27/12 0527 11/28/12 1140  NA 124* 129* 131* 133* 130*  K 3.8 3.2* 3.6 3.4* 3.3*  CL 100 104 107 109 105  CO2 9* 10* 11* 13* 13*  GLUCOSE 117* 95 109* 107* 154*  BUN 18 16 16 14 11   CREATININE 1.15 1.06 0.97 1.01 0.84  CALCIUM 7.3* 8.0* 7.9* 7.7* 7.8*   Liver Function Tests:  Recent Labs Lab 11/24/12 1235 11/25/12 0542 11/26/12 0500 11/27/12 0527 11/28/12 1140  AST 162* 250* 246* 165* 102*  ALT 62* 74* 87* 76* 62*  ALKPHOS 227* 229* 203* 186* 194*  BILITOT 18.2* 19.0* 16.9* 15.6* 16.7*  PROT 5.7* 6.0 5.6* 5.4* 5.5*  ALBUMIN 1.3* 1.5* 1.4* 1.3* 1.4*    Recent Labs Lab 11/24/12 1235 11/25/12 0542 11/26/12 0500 11/28/12 1140  LIPASE 256* 486* 203* 712*  AMYLASE  --  250*  --   --     Recent Labs Lab 11/24/12 1235  AMMONIA 18   CBC:  Recent Labs Lab 11/24/12 1235 11/25/12 0542 11/25/12 1857 11/26/12 0500 11/27/12 0527 11/27/12 1800 11/28/12 0541 11/28/12 1140  WBC 47.0* 30.5* 18.9* 20.1* 20.0*  --   --  17.6*  NEUTROABS 40.3*  --   --   --  15.1*  --   --   --   HGB 4.8* 7.5* 6.4* 7.5* 6.9* 7.9* 7.9* 8.0*   HCT 13.3* 20.8* 18.5* 20.9* 18.8* 22.5* 22.2* 22.9*  MCV 95.0 88.9 88.5 91.3 91.3  --   --  86.4  PLT 401* 371 294 309 325  --   --  285    Recent Results (from the past 240 hour(s))  CULTURE, BLOOD (ROUTINE X 2)     Status: None   Collection Time    11/24/12  2:10 PM      Result Value Range Status   Specimen Description BLOOD RIGHT ARM   Final   Special Requests     Final   Value: BOTTLES DRAWN AEROBIC AND ANAEROBIC 10CC BLUE 5CCRED   Culture  Setup Time     Final   Value: 11/24/2012 22:17     Performed at [facility]   Culture     Final   Value:        BLOOD CULTURE RECEIVED NO GROWTH TO DATE CULTURE WILL BE HELD FOR 5 DAYS BEFORE ISSUING A FINAL NEGATIVE REPORT     Performed at [facility]   Report Status PENDING  Incomplete  CULTURE, BLOOD (ROUTINE X 2)     Status: None   Collection Time    11/24/12  2:20 PM      Result Value Range Status   Specimen Description BLOOD RIGHT HAND   Final   Special Requests BOTTLES DRAWN AEROBIC ONLY 10CC   Final   Culture  Setup Time     Final   Value: 11/24/2012 22:17     Performed at [facility]   Culture     Final   Value:        BLOOD CULTURE RECEIVED NO GROWTH TO DATE CULTURE WILL BE HELD FOR 5 DAYS BEFORE ISSUING A FINAL NEGATIVE REPORT     Performed at [facility]   Report Status PENDING   Incomplete  URINE CULTURE     Status: None   Collection Time    11/24/12  3:10 PM      Result Value Range Status   Specimen Description URINE, CLEAN CATCH   Final   Special Requests NONE   Final   Culture  Setup Time     Final   Value: 11/24/2012 16:42     Performed at [facility] Count     Final   Value: NO GROWTH     Performed at [facility]   Culture     Final   Value: NO GROWTH     Performed at [facility]   Report Status 11/25/2012 FINAL   Final  MRSA PCR SCREENING     Status: None   Collection Time    11/24/12  6:33 PM      Result Value Range Status   MRSA  by PCR NEGATIVE  NEGATIVE Final   Comment:            The GeneXpert MRSA Assay (FDA     approved for NASAL specimens     only), is one component of a     comprehensive MRSA colonization     surveillance program. It is not     intended to diagnose MRSA     infection nor to guide or     monitor treatment for     MRSA infections.    Pihu Basil,MD [phone] 11/27/2012, 4:26 PM    On-Call/Text Page:      [email or web address]      password [email or web address]  If 7PM-7AM, please contact night-coverage [email or web address] Password [email or web address] 11/28/2012, 3:40 PM   LOS: 4 days

## 2012-11-29 ENCOUNTER — Inpatient Hospital Stay (HOSPITAL_COMMUNITY): Payer: No Typology Code available for payment source

## 2012-11-29 LAB — BASIC METABOLIC PANEL
BUN: 9 mg/dL (ref 6–23)
Calcium: 7.7 mg/dL — ABNORMAL LOW (ref 8.4–10.5)
Creatinine, Ser: 0.79 mg/dL (ref 0.50–1.35)
GFR calc Af Amer: >90 mL/min (ref >90)
GFR calc non Af Amer: >90 mL/min (ref >90)

## 2012-11-29 LAB — HEMOGLOBIN AND HEMATOCRIT, BLOOD: Hemoglobin: 7.1 g/dL — ABNORMAL LOW (ref 13.0–17.0)

## 2012-11-29 LAB — LIPASE, BLOOD: Lipase: 665 U/L — ABNORMAL HIGH (ref 11–59)

## 2012-11-29 LAB — PROCALCITONIN: Procalcitonin: 0.26 ng/mL

## 2012-11-29 MED ORDER — IOHEXOL 300 MG/ML  SOLN
100.0000 mL | Freq: Once | INTRAMUSCULAR | Status: AC | PRN
  Administered 2012-11-29: 18:00:00 100 mL via INTRAVENOUS

## 2012-11-29 MED ORDER — IOHEXOL 300 MG/ML  SOLN
25.0000 mL | INTRAMUSCULAR | Status: AC
  Administered 2012-11-29 (×2): 25 mL via ORAL

## 2012-11-29 NOTE — Progress Notes (Signed)
[facility] Progress Note   Danny Arnold WUJ:[id number] DOB: 18-Oct-1975 DOA: 11/24/2012 PCP: [name], MD  Brief narrative: 37 year old male patient with known alcoholic hepatitis with underlying cirrhosis, baseline total bilirubin around 20. Initially admitted in September of this year with acute jaundice due to same problems. Evaluated by Gastroenterology during that admission and complete alcohol cessation was recommended. Apparently had been in usual state of health until the date of admission when he developed massive rectal bleeding. The patient reported he sat on the toilet for around 1.5 hours but when attempted to stand up he became very dizzy and lightheaded but did not have a syncopal episode. He had some mild abdominal discomfort but no constitutional symptoms. In the ER he was hyponatremic which is a chronic problem for him. He had metabolic acidosis with a bicarbonate of 9. Anion gap 15. Total bilirubin around 18.2. Lipase mildly elevated at 256. Hemoglobin was markedly decreased at 4.8 and his white count was markedly increased at 47,000.  Assessment/Plan:    Cirrhosis, alcoholic/Portal hypertension:  - improving liver function tests.  -Octreotide dc'd by GI -GI endorsed from GI standpoint OK for dc (see below) -Hepatitis panel negative  -ANA, AMA, cerulopasmin and alpha-1 anti trypsin negative so far.     GI bleed/hematochezia -upper/lower endoscopy revealed only a few internal hemorrhoids and non bleeding varices- bleeding not well explained - but hgb drifted down despite 6 units of PRBC transfusions.  -negative H. Pylori bx's - ordered another 2 units of prbc transfusion.       Metabolic acidosis -?? Etiology -ABG PCO2 and bicarb low but pH normal which is suggestive of a compensated metabolic acidosis- lactic acid 2.9- repeat in am shows improvement.  -no current abdominal pain so doubt mesenteric ischemia and also no ischemic findings on  colonoscopy - ? From decompensated liver cirrhosis.     Hyponatremia -chronic problem is stable    Leukocytosis -trend down - no fever - likely leukomoid rxn -had leukocytosis last admit due to citrobacter bacteremia- no fever and has been on empiric anbx's-blood cx's from 10/12 NGTD -smear showed lymphocytosis and neutrophilia, possibly a reactive process.     Abdominal pain -doubt SBP but will cont empiric coverage as precaution given leukocytosis -has resolved  Elevated lipase levels/ metabolic acidosis: ? Pancreatitis. Will get CT abd and pelvis to evaluate for pancreatic necrosis. He is asymptomatic.      DVT prophylaxis: SCDs Code Status: Full Family Communication: Patient only- conversation limited due to patient's minimal understanding of English-ultimately needed to use interpretor Disposition Plan/Expected LOS: possibly in am.   Consultants: Gastroenterology  Procedures: EGD  ENDOSCOPIC IMPRESSION:  first grade esophageal varices. One of straining. No stigmata of  bleeding  No blood in the stomach or duodenum  Mild-to-moderate duodenitis. Status post biopsies for H. pylori.  There is nothing to account for patient's GI blood loss  Colonoscopy  ENDOSCOPIC IMPRESSION:  Moderate sized internal hemorrhoids  no blood in colon  No other lesions to explain severe anemia   Antibiotics: Cefotaxime 10/12 >>>10/15  HPI/Subjective: Patient alert and denies abd pain, N/V/D, SOB or CP.  Objective: Blood pressure 104/62, pulse 98, temperature 98.4 F (36.9 C), temperature source Oral, resp. rate 18, height 5\' 7"  (1.702 m), weight 68.5 kg (151 lb 0.2 oz), SpO2 100.00%.  Intake/Output Summary (Last 24 hours) at 11/29/12 1756 Last data filed at 11/29/12 1300  Gross per 24 hour  Intake    930 ml  Output  3 ml  Net    927 ml   Exam: General: No acute respiratory distress-jaundiced Lungs: Clear to auscultation bilaterally without wheezes or crackles,  RA Cardiovascular: Regular rate and rhythm without murmur gallop or rub normal S1 and S2, no peripheral edema or JVD Abdomen: Nontender, slightly distended, soft, bowel sounds positive, no rebound, no ascites, no appreciable mass Musculoskeletal: No significant cyanosis, clubbing of bilateral lower extremities Neurological: Alert, moves all extremities x 4 without focal neurological deficits, CN 2-12 intact  Scheduled Meds:  Scheduled Meds: . docusate sodium  200 mg Oral BID  . feeding supplement (RESOURCE BREEZE)  1 Container Oral TID BM  . folic acid  1 mg Oral Daily  . hydrocortisone  25 mg Rectal BID  . multivitamin with minerals  1 tablet Oral Daily  . pantoprazole  40 mg Oral Q0600  . potassium chloride SA  40 mEq Oral BID  . sodium bicarbonate  1,300 mg Oral BID  . thiamine  100 mg Oral Daily   Continuous Infusions:    Data Reviewed: Basic Metabolic Panel:  Recent Labs Lab 11/25/12 0542 11/26/12 0500 11/27/12 0527 11/28/12 1140 11/29/12 1021  NA 129* 131* 133* 130* 131*  K 3.2* 3.6 3.4* 3.3* 3.5  CL 104 107 109 105 107  CO2 10* 11* 13* 13* 11*  GLUCOSE 95 109* 107* 154* 109*  BUN 16 16 14 11 9   CREATININE 1.06 0.97 1.01 0.84 0.79  CALCIUM 8.0* 7.9* 7.7* 7.8* 7.7*   Liver Function Tests:  Recent Labs Lab 11/24/12 1235 11/25/12 0542 11/26/12 0500 11/27/12 0527 11/28/12 1140  AST 162* 250* 246* 165* 102*  ALT 62* 74* 87* 76* 62*  ALKPHOS 227* 229* 203* 186* 194*  BILITOT 18.2* 19.0* 16.9* 15.6* 16.7*  PROT 5.7* 6.0 5.6* 5.4* 5.5*  ALBUMIN 1.3* 1.5* 1.4* 1.3* 1.4*    Recent Labs Lab 11/24/12 1235 11/25/12 0542 11/26/12 0500 11/28/12 1140 11/29/12 1021  LIPASE 256* 486* 203* 712* 665*  AMYLASE  --  250*  --   --   --     Recent Labs Lab 11/24/12 1235  AMMONIA 18   CBC:  Recent Labs Lab 11/24/12 1235 11/25/12 0542 11/25/12 1857 11/26/12 0500 11/27/12 0527 11/27/12 1800 11/28/12 0541 11/28/12 1140 11/29/12 1021  WBC 47.0* 30.5*  18.9* 20.1* 20.0*  --   --  17.6*  --   NEUTROABS 40.3*  --   --   --  15.1*  --   --   --   --   HGB 4.8* 7.5* 6.4* 7.5* 6.9* 7.9* 7.9* 8.0* 7.1*  HCT 13.3* 20.8* 18.5* 20.9* 18.8* 22.5* 22.2* 22.9* 19.6*  MCV 95.0 88.9 88.5 91.3 91.3  --   --  86.4  --   PLT 401* 371 294 309 325  --   --  285  --     Recent Results (from the past 240 hour(s))  CULTURE, BLOOD (ROUTINE X 2)     Status: None   Collection Time    11/24/12  2:10 PM      Result Value Range Status   Specimen Description BLOOD RIGHT ARM   Final   Special Requests     Final   Value: BOTTLES DRAWN AEROBIC AND ANAEROBIC 10CC BLUE 5CCRED   Culture  Setup Time     Final   Value: 11/24/2012 22:17     Performed at [facility]   Culture     Final   Value:  BLOOD CULTURE RECEIVED NO GROWTH TO DATE CULTURE WILL BE HELD FOR 5 DAYS BEFORE ISSUING A FINAL NEGATIVE REPORT     Performed at [facility]   Report Status PENDING   Incomplete  CULTURE, BLOOD (ROUTINE X 2)     Status: None   Collection Time    11/24/12  2:20 PM      Result Value Range Status   Specimen Description BLOOD RIGHT HAND   Final   Special Requests BOTTLES DRAWN AEROBIC ONLY 10CC   Final   Culture  Setup Time     Final   Value: 11/24/2012 22:17     Performed at [facility]   Culture     Final   Value:        BLOOD CULTURE RECEIVED NO GROWTH TO DATE CULTURE WILL BE HELD FOR 5 DAYS BEFORE ISSUING A FINAL NEGATIVE REPORT     Performed at [facility]   Report Status PENDING   Incomplete  URINE CULTURE     Status: None   Collection Time    11/24/12  3:10 PM      Result Value Range Status   Specimen Description URINE, CLEAN CATCH   Final   Special Requests NONE   Final   Culture  Setup Time     Final   Value: 11/24/2012 16:42     Performed at [facility] Count     Final   Value: NO GROWTH     Performed at [facility]   Culture     Final   Value: NO GROWTH     Performed at [facility]   Report Status 11/25/2012 FINAL   Final  MRSA PCR SCREENING     Status: None   Collection Time    11/24/12  6:33 PM      Result Value Range Status   MRSA by PCR NEGATIVE  NEGATIVE Final   Comment:            The GeneXpert MRSA Assay (FDA     approved for NASAL specimens     only), is one component of a     comprehensive MRSA colonization     surveillance program. It is not     intended to diagnose MRSA     infection nor to guide or     monitor treatment for     MRSA infections.    Norville Dani,MD [phone] 11/27/2012, 4:26 PM    On-Call/Text Page:      [email or web address]      password [email or web address]  If 7PM-7AM, please contact night-coverage [email or web address] Password [email or web address] 11/29/2012, 5:56 PM   LOS: 5 days

## 2012-11-30 LAB — BASIC METABOLIC PANEL WITH GFR
BUN: 9 mg/dL (ref 6–23)
CO2: 12 meq/L — ABNORMAL LOW (ref 19–32)
Calcium: 7.7 mg/dL — ABNORMAL LOW (ref 8.4–10.5)
Chloride: 107 meq/L (ref 96–112)
Creatinine, Ser: 0.75 mg/dL (ref 0.50–1.35)
GFR calc Af Amer: >90 mL/min
GFR calc non Af Amer: >90 mL/min
Glucose, Bld: 98 mg/dL (ref 70–99)
Potassium: 3.7 meq/L (ref 3.5–5.1)
Sodium: 129 meq/L — ABNORMAL LOW (ref 135–145)

## 2012-11-30 LAB — CULTURE, BLOOD (ROUTINE X 2): Culture: NO GROWTH

## 2012-11-30 LAB — CBC
HCT: 21.5 % — ABNORMAL LOW (ref 39.0–52.0)
Hemoglobin: 7.7 g/dL — ABNORMAL LOW (ref 13.0–17.0)
MCH: 29.8 pg (ref 26.0–34.0)
MCHC: 35.8 g/dL (ref 30.0–36.0)
MCV: 83.3 fL (ref 78.0–100.0)
Platelets: 238 10*3/uL (ref 150–400)
RDW: 18.4 % — ABNORMAL HIGH (ref 11.5–15.5)

## 2012-11-30 NOTE — Progress Notes (Signed)
[facility] Progress Note   Danny Arnold ZOX:[id number] DOB: [birth year] DOA: 11/24/2012 PCP: [name], MD  Brief narrative: 37 year old male patient with known alcoholic hepatitis with underlying cirrhosis, baseline total bilirubin around 20. Initially admitted in September of this year with acute jaundice due to same problems. Evaluated by Gastroenterology during that admission and complete alcohol cessation was recommended. Apparently had been in usual state of health until the date of admission when he developed massive rectal bleeding. The patient reported he sat on the toilet for around 1.5 hours but when attempted to stand up he became very dizzy and lightheaded but did not have a syncopal episode. He had some mild abdominal discomfort but no constitutional symptoms. In the ER he was hyponatremic which is a chronic problem for him. He had metabolic acidosis with a bicarbonate of 9. Anion gap 15. Total bilirubin around 18.2. Lipase mildly elevated at 256. Hemoglobin was markedly decreased at 4.8 and his white count was markedly increased at 47,000.  Assessment/Plan:    Cirrhosis, alcoholic/Portal hypertension:  - improving liver function tests.  -Octreotide dc'd by GI -GI endorsed from GI standpoint OK for dc (see below) -Hepatitis panel negative  -ANA, AMA, cerulopasmin and alpha-1 anti trypsin negative so far.     GI bleed/hematochezia -upper/lower endoscopy revealed only a few internal hemorrhoids and non bleeding varices- bleeding not well explained - but hgb drifted down despite 6 units of PRBC transfusions.  -negative H. Pylori bx's - ordered another 2 units of prbc transfusion.  - repeat cbc showed improvement in hemoglobin. And his rectal bleeding has improved.       Metabolic acidosis -?? Etiology -ABG PCO2 and bicarb low but pH normal which is suggestive of a compensated metabolic acidosis- lactic acid 2.9- repeat in am shows improvement.  -no current  abdominal pain so doubt mesenteric ischemia and also no ischemic findings on colonoscopy - ? From decompensated liver cirrhosis.     Hyponatremia -chronic problem is stable    Leukocytosis -trend down - no fever - likely leukomoid rxn -had leukocytosis last admit due to citrobacter bacteremia- no fever and has been on empiric anbx's-blood cx's from 10/12 NGTD -smear showed lymphocytosis and neutrophilia, possibly a reactive process.     Abdominal pain -doubt SBP but will cont empiric coverage as precaution given leukocytosis -has resolved  Elevated lipase levels/ metabolic acidosis: ? Pancreatitis. Will get CT abd and pelvis to evaluate for pancreatic necrosis. He is asymptomatic.      DVT prophylaxis: SCDs Code Status: Full Family Communication: Patient only- conversation limited due to patient's minimal understanding of English-ultimately needed to use interpretor Disposition Plan/Expected LOS: possibly in am.   Consultants: Gastroenterology  Procedures: EGD  ENDOSCOPIC IMPRESSION:  first grade esophageal varices. One of straining. No stigmata of  bleeding  No blood in the stomach or duodenum  Mild-to-moderate duodenitis. Status post biopsies for H. pylori.  There is nothing to account for patient's GI blood loss  Colonoscopy  ENDOSCOPIC IMPRESSION:  Moderate sized internal hemorrhoids  no blood in colon  No other lesions to explain severe anemia   Antibiotics: Cefotaxime 10/12 >>>10/15  HPI/Subjective: Patient alert and denies abd pain, N/V/D, SOB or CP.his rectal bleeding has improved.   Objective: Blood pressure 112/60, pulse 92, temperature 97.7 F (36.5 C), temperature source Oral, resp. rate 18, height 5\' 7"  (1.702 m), weight 68.5 kg (151 lb 0.2 oz), SpO2 100.00%.  Intake/Output Summary (Last 24 hours) at 11/30/12 1930 Last data filed at 11/30/12 1420  Gross per 24 hour  Intake  352.5 ml  Output      0 ml  Net  352.5 ml   Exam: General: No acute  respiratory distress-jaundiced Lungs: Clear to auscultation bilaterally without wheezes or crackles, RA Cardiovascular: Regular rate and rhythm without murmur gallop or rub normal S1 and S2, no peripheral edema or JVD Abdomen: Nontender, slightly distended, soft, bowel sounds positive, no rebound, no ascites, no appreciable mass Musculoskeletal: No significant cyanosis, clubbing of bilateral lower extremities Neurological: Alert, moves all extremities x 4 without focal neurological deficits, CN 2-12 intact  Scheduled Meds:  Scheduled Meds: . docusate sodium  200 mg Oral BID  . feeding supplement (RESOURCE BREEZE)  1 Container Oral TID BM  . folic acid  1 mg Oral Daily  . hydrocortisone  25 mg Rectal BID  . multivitamin with minerals  1 tablet Oral Daily  . pantoprazole  40 mg Oral Q0600  . potassium chloride SA  40 mEq Oral BID  . sodium bicarbonate  1,300 mg Oral BID  . thiamine  100 mg Oral Daily   Continuous Infusions:    Data Reviewed: Basic Metabolic Panel:  Recent Labs Lab 11/26/12 0500 11/27/12 0527 11/28/12 1140 11/29/12 1021 11/30/12 0648  NA 131* 133* 130* 131* 129*  K 3.6 3.4* 3.3* 3.5 3.7  CL 107 109 105 107 107  CO2 11* 13* 13* 11* 12*  GLUCOSE 109* 107* 154* 109* 98  BUN 16 14 11 9 9   CREATININE 0.97 1.01 0.84 0.79 0.75  CALCIUM 7.9* 7.7* 7.8* 7.7* 7.7*   Liver Function Tests:  Recent Labs Lab 11/24/12 1235 11/25/12 0542 11/26/12 0500 11/27/12 0527 11/28/12 1140  AST 162* 250* 246* 165* 102*  ALT 62* 74* 87* 76* 62*  ALKPHOS 227* 229* 203* 186* 194*  BILITOT 18.2* 19.0* 16.9* 15.6* 16.7*  PROT 5.7* 6.0 5.6* 5.4* 5.5*  ALBUMIN 1.3* 1.5* 1.4* 1.3* 1.4*    Recent Labs Lab 11/24/12 1235 11/25/12 0542 11/26/12 0500 11/28/12 1140 11/29/12 1021  LIPASE 256* 486* 203* 712* 665*  AMYLASE  --  250*  --   --   --     Recent Labs Lab 11/24/12 1235  AMMONIA 18   CBC:  Recent Labs Lab 11/24/12 1235  11/25/12 1857 11/26/12 0500  11/27/12 0527 11/27/12 1800 11/28/12 0541 11/28/12 1140 11/29/12 1021 11/30/12 0648  WBC 47.0*  < > 18.9* 20.1* 20.0*  --   --  17.6*  --  20.8*  NEUTROABS 40.3*  --   --   --  15.1*  --   --   --   --   --   HGB 4.8*  < > 6.4* 7.5* 6.9* 7.9* 7.9* 8.0* 7.1* 7.7*  HCT 13.3*  < > 18.5* 20.9* 18.8* 22.5* 22.2* 22.9* 19.6* 21.5*  MCV 95.0  < > 88.5 91.3 91.3  --   --  86.4  --  83.3  PLT 401*  < > 294 309 325  --   --  285  --  238  < > = values in this interval not displayed.  Recent Results (from the past 240 hour(s))  CULTURE, BLOOD (ROUTINE X 2)     Status: None   Collection Time    11/24/12  2:10 PM      Result Value Range Status   Specimen Description BLOOD RIGHT ARM   Final   Special Requests     Final   Value: BOTTLES DRAWN AEROBIC AND ANAEROBIC 10CC  BLUE 5CCRED   Culture  Setup Time     Final   Value: 11/24/2012 22:17     Performed at [facility]   Culture     Final   Value: NO GROWTH 5 DAYS     Performed at [facility]   Report Status 11/30/2012 FINAL   Final  CULTURE, BLOOD (ROUTINE X 2)     Status: None   Collection Time    11/24/12  2:20 PM      Result Value Range Status   Specimen Description BLOOD RIGHT HAND   Final   Special Requests BOTTLES DRAWN AEROBIC ONLY 10CC   Final   Culture  Setup Time     Final   Value: 11/24/2012 22:17     Performed at [facility]   Culture     Final   Value: NO GROWTH 5 DAYS     Performed at [facility]   Report Status 11/30/2012 FINAL   Final  URINE CULTURE     Status: None   Collection Time    11/24/12  3:10 PM      Result Value Range Status   Specimen Description URINE, CLEAN CATCH   Final   Special Requests NONE   Final   Culture  Setup Time     Final   Value: 11/24/2012 16:42     Performed at [facility] Count     Final   Value: NO GROWTH     Performed at [facility]   Culture     Final   Value: NO GROWTH     Performed at [facility]   Report  Status 11/25/2012 FINAL   Final  MRSA PCR SCREENING     Status: None   Collection Time    11/24/12  6:33 PM      Result Value Range Status   MRSA by PCR NEGATIVE  NEGATIVE Final   Comment:            The GeneXpert MRSA Assay (FDA     approved for NASAL specimens     only), is one component of a     comprehensive MRSA colonization     surveillance program. It is not     intended to diagnose MRSA     infection nor to guide or     monitor treatment for     MRSA infections.    Garnetta Fedrick,MD [phone] 11/27/2012, 4:26 PM    On-Call/Text Page:      [email or web address]      password [email or web address]  If 7PM-7AM, please contact night-coverage [email or web address] Password [email or web address] 11/30/2012, 7:30 PM   LOS: 6 days

## 2012-12-01 LAB — BLOOD GAS, ARTERIAL
Bicarbonate: 10.6 mEq/L — ABNORMAL LOW (ref 20.0–24.0)
FIO2: 0.21 %
O2 Saturation: 98.5 %
Patient temperature: 99.2
pH, Arterial: 7.385 (ref 7.350–7.450)

## 2012-12-01 LAB — TYPE AND SCREEN
ABO/RH(D): O POS
DAT, IgG: NEGATIVE
Unit division: 0
Unit division: 0
Unit division: 0
Unit division: 0
Unit division: 0

## 2012-12-01 LAB — BASIC METABOLIC PANEL
Calcium: 7.9 mg/dL — ABNORMAL LOW (ref 8.4–10.5)
Chloride: 108 mEq/L (ref 96–112)
Creatinine, Ser: 0.79 mg/dL (ref 0.50–1.35)
GFR calc Af Amer: >90 mL/min (ref >90)
GFR calc non Af Amer: >90 mL/min (ref >90)
Glucose, Bld: 124 mg/dL — ABNORMAL HIGH (ref 70–99)
Potassium: 3.5 mEq/L (ref 3.5–5.1)
Sodium: 132 mEq/L — ABNORMAL LOW (ref 135–145)

## 2012-12-01 LAB — CBC
HCT: 22 % — ABNORMAL LOW (ref 39.0–52.0)
Hemoglobin: 7.6 g/dL — ABNORMAL LOW (ref 13.0–17.0)
MCH: 29.3 pg (ref 26.0–34.0)
MCHC: 34.5 g/dL (ref 30.0–36.0)
Platelets: 271 10*3/uL (ref 150–400)
RDW: 19.9 % — ABNORMAL HIGH (ref 11.5–15.5)

## 2012-12-01 LAB — TROPONIN I: Troponin I: <0.3 ng/mL (ref <0.30)

## 2012-12-01 MED ORDER — GI COCKTAIL ~~LOC~~
30.0000 mL | Freq: Once | ORAL | Status: AC
  Administered 2012-12-01: 30 mL via ORAL
  Filled 2012-12-01 (×2): qty 30

## 2012-12-01 MED ORDER — SODIUM BICARBONATE 650 MG PO TABS
1300.0000 mg | ORAL_TABLET | Freq: Four times a day (QID) | ORAL | Status: DC
  Administered 2012-12-01 – 2012-12-02 (×5): 1300 mg via ORAL
  Filled 2012-12-01 (×7): qty 2

## 2012-12-01 MED ORDER — TRAMADOL HCL 50 MG PO TABS
50.0000 mg | ORAL_TABLET | Freq: Once | ORAL | Status: AC
  Administered 2012-12-01: 22:00:00 50 mg via ORAL
  Filled 2012-12-01 (×2): qty 1

## 2012-12-01 MED ORDER — SODIUM BICARBONATE 650 MG PO TABS: 1300.0000 mg | ORAL_TABLET | Freq: Three times a day (TID) | ORAL | Status: DC

## 2012-12-01 NOTE — Progress Notes (Addendum)
CRITICAL VALUE ALERT  Critical value received:  CO2  Date of notification:  12/01/12  Time of notification:  0635  Critical value read back:yes  Nurse who received alert:  Hermelinda Medicus, RN   MD notified (1st page):  T.[email or web address], NP   Time of first page:  [phone]  MD notified (2nd page):  Time of second page:  Responding MD:  Benedetto Coons, NP   Time MD responded:  [phone]

## 2012-12-01 NOTE — Progress Notes (Signed)
[facility] progress note Chief complaint. Chest pain. History of present illness. This 37 year old male in hospital with cirrhosis and portal hypertension, GI bleed, metabolic acidosis, etc. He is no prior cardiac history per review of prior records. There are no prior EKG in the hospital record. Patient had a dinner including fried chicken and shortly afterwards complained of low sternal pain. I was informed and came to the bedside to assess the patient. Communication is somewhat difficult because the patient speaks only Spanish but fortunately a friend was at the bedside to help translate. Patient indicates that the pain has migrated from the lower sternal area now to the epigastric area. There is pain to that area with palpation that duplicates the pain the patient is experiencing. A 12-lead EKG was done at the bedside. Unfortunately there are no prior EKG for comparison but I do note a flipped T wave in V3. Vital signs. Temperature 98.1, pulse 100, respiration 18, blood pressure 104/60. O2 sats 100. General appearance. Ill-appearing middle-aged male who is alert and in no distress. Clearly experiencing a degree of pain in the epigastric area. Cardiac. Rate and rhythm regular. Lungs. Breath sounds clear and equal. Abdomen. Soft and somewhat protuberant with positive bowel sounds present. There is pain with palpation over the epigastric area. Impression/plan. Problem 1. Chest pain. Given patient's presentation and current location of pain I suspect source is GI rather than cardiac. I will give the patient a GI cocktail and a dose of Ultram to hopefully improve his symptoms. Cannot entirely rule out a cardiac source and I will obtain troponin now and then every 6 hours for 3 sets. We'll also repeat EKG in about 8 hours to evaluate for any changes.

## 2012-12-01 NOTE — Plan of Care (Signed)
Problem: Discharge Progression Outcomes Goal: Pain controlled with appropriate interventions Outcome: Completed/Met Date Met:  12/01/12 No complaints of pain during this shift

## 2012-12-01 NOTE — Progress Notes (Signed)
[facility] Progress Note   Danny Arnold UJW:[id number] DOB: Jan 26, 1976 DOA: 11/24/2012 PCP: [name], MD  Brief narrative: 37 year old male patient with known alcoholic hepatitis with underlying cirrhosis, baseline total bilirubin around 20. Initially admitted in September of this year with acute jaundice due to same problems. Evaluated by Gastroenterology during that admission and complete alcohol cessation was recommended. Apparently had been in usual state of health until the date of admission when he developed massive rectal bleeding. The patient reported he sat on the toilet for around 1.5 hours but when attempted to stand up he became very dizzy and lightheaded but did not have a syncopal episode. He had some mild abdominal discomfort but no constitutional symptoms. In the ER he was hyponatremic which is a chronic problem for him. He had metabolic acidosis with a bicarbonate of 9. Anion gap 15. Total bilirubin around 18.2. Lipase mildly elevated at 256. Hemoglobin was markedly decreased at 4.8 and his white count was markedly increased at 47,000.  Assessment/Plan:    Cirrhosis, alcoholic/Portal hypertension:  - improving liver function tests.  -Octreotide dc'd by GI -GI endorsed from GI standpoint OK for dc (see below) -Hepatitis panel negative  -ANA, AMA, cerulopasmin and alpha-1 anti trypsin negative so far.     GI bleed/hematochezia -upper/lower endoscopy revealed only a few internal hemorrhoids and non bleeding varices- bleeding not well explained - but hgb drifted down despite 6 units of PRBC transfusions.  -negative H. Pylori bx's - ordered another 2 units of prbc transfusion.  - repeat cbc showed improvement in hemoglobin. And his rectal bleeding has improved.       Metabolic acidosis -?? Etiology -ABG PCO2 and bicarb low but pH normal which is suggestive of a compensated metabolic acidosis- lactic acid 2.9- repeat in am shows improvement.  -no current  abdominal pain so doubt mesenteric ischemia and also no ischemic findings on colonoscopy - ? From decompensated liver cirrhosis.     Hyponatremia -chronic problem is stable    Leukocytosis -trend down - no fever - likely leukomoid rxn -had leukocytosis last admit due to citrobacter bacteremia- no fever and has been on empiric anbx's-blood cx's from 10/12 NGTD -smear showed lymphocytosis and neutrophilia, possibly a reactive process.     Abdominal pain -doubt SBP but will cont empiric coverage as precaution given leukocytosis -has resolved  Elevated lipase levels/ metabolic acidosis: ? Pancreatitis. Will get CT abd and pelvis to evaluate for pancreatic necrosis. He is asymptomatic.      DVT prophylaxis: SCDs Code Status: Full Family Communication: Patient only- conversation limited due to patient's minimal understanding of English-ultimately needed to use interpretor Disposition Plan/Expected LOS: possibly in am.   Consultants: Gastroenterology  Procedures: EGD  ENDOSCOPIC IMPRESSION:  first grade esophageal varices. One of straining. No stigmata of  bleeding  No blood in the stomach or duodenum  Mild-to-moderate duodenitis. Status post biopsies for H. pylori.  There is nothing to account for patient's GI blood loss  Colonoscopy  ENDOSCOPIC IMPRESSION:  Moderate sized internal hemorrhoids  no blood in colon  No other lesions to explain severe anemia   Antibiotics: Cefotaxime 10/12 >>>10/15  HPI/Subjective: Patient alert and denies abd pain, N/V/D, SOB or CP.his rectal bleeding has improved.   Objective: Blood pressure 111/66, pulse 100, temperature 98.1 F (36.7 C), temperature source Oral, resp. rate 18, height 5\' 7"  (1.702 m), weight 68.5 kg (151 lb 0.2 oz), SpO2 100.00%.  Intake/Output Summary (Last 24 hours) at 12/01/12 1800 Last data filed at 12/01/12 1519  Gross per 24 hour  Intake    360 ml  Output      1 ml  Net    359 ml   Exam: General: No acute  respiratory distress-jaundiced Lungs: Clear to auscultation bilaterally without wheezes or crackles, RA Cardiovascular: Regular rate and rhythm without murmur gallop or rub normal S1 and S2, no peripheral edema or JVD Abdomen: Nontender, slightly distended, soft, bowel sounds positive, no rebound, no ascites, no appreciable mass Musculoskeletal: No significant cyanosis, clubbing of bilateral lower extremities Neurological: Alert, moves all extremities x 4 without focal neurological deficits, CN 2-12 intact  Scheduled Meds:  Scheduled Meds: . docusate sodium  200 mg Oral BID  . feeding supplement (RESOURCE BREEZE)  1 Container Oral TID BM  . folic acid  1 mg Oral Daily  . hydrocortisone  25 mg Rectal BID  . multivitamin with minerals  1 tablet Oral Daily  . pantoprazole  40 mg Oral Q0600  . potassium chloride SA  40 mEq Oral BID  . sodium bicarbonate  1,300 mg Oral QID  . thiamine  100 mg Oral Daily   Continuous Infusions:    Data Reviewed: Basic Metabolic Panel:  Recent Labs Lab 11/27/12 0527 11/28/12 1140 11/29/12 1021 11/30/12 0648 12/01/12 0526  NA 133* 130* 131* 129* 132*  K 3.4* 3.3* 3.5 3.7 3.5  CL 109 105 107 107 108  CO2 13* 13* 11* 12* 10*  GLUCOSE 107* 154* 109* 98 124*  BUN 14 11 9 9 10   CREATININE 1.01 0.84 0.79 0.75 0.79  CALCIUM 7.7* 7.8* 7.7* 7.7* 7.9*   Liver Function Tests:  Recent Labs Lab 11/25/12 0542 11/26/12 0500 11/27/12 0527 11/28/12 1140  AST 250* 246* 165* 102*  ALT 74* 87* 76* 62*  ALKPHOS 229* 203* 186* 194*  BILITOT 19.0* 16.9* 15.6* 16.7*  PROT 6.0 5.6* 5.4* 5.5*  ALBUMIN 1.5* 1.4* 1.3* 1.4*    Recent Labs Lab 11/25/12 0542 11/26/12 0500 11/28/12 1140 11/29/12 1021  LIPASE 486* 203* 712* 665*  AMYLASE 250*  --   --   --    No results found for this basename: AMMONIA,  in the last 168 hours CBC:  Recent Labs Lab 11/26/12 0500 11/27/12 0527  11/28/12 0541 11/28/12 1140 11/29/12 1021 11/30/12 0648 12/01/12 0526   WBC 20.1* 20.0*  --   --  17.6*  --  20.8* 18.3*  NEUTROABS  --  15.1*  --   --   --   --   --   --   HGB 7.5* 6.9*  < > 7.9* 8.0* 7.1* 7.7* 7.6*  HCT 20.9* 18.8*  < > 22.2* 22.9* 19.6* 21.5* 22.0*  MCV 91.3 91.3  --   --  86.4  --  83.3 84.9  PLT 309 325  --   --  285  --  238 271  < > = values in this interval not displayed.  Recent Results (from the past 240 hour(s))  CULTURE, BLOOD (ROUTINE X 2)     Status: None   Collection Time    11/24/12  2:10 PM      Result Value Range Status   Specimen Description BLOOD RIGHT ARM   Final   Special Requests     Final   Value: BOTTLES DRAWN AEROBIC AND ANAEROBIC 10CC BLUE 5CCRED   Culture  Setup Time     Final   Value: 11/24/2012 22:17     Performed at [facility]  Final   Value: NO GROWTH 5 DAYS     Performed at [facility]   Report Status 11/30/2012 FINAL   Final  CULTURE, BLOOD (ROUTINE X 2)     Status: None   Collection Time    11/24/12  2:20 PM      Result Value Range Status   Specimen Description BLOOD RIGHT HAND   Final   Special Requests BOTTLES DRAWN AEROBIC ONLY 10CC   Final   Culture  Setup Time     Final   Value: 11/24/2012 22:17     Performed at [facility]   Culture     Final   Value: NO GROWTH 5 DAYS     Performed at [facility]   Report Status 11/30/2012 FINAL   Final  URINE CULTURE     Status: None   Collection Time    11/24/12  3:10 PM      Result Value Range Status   Specimen Description URINE, CLEAN CATCH   Final   Special Requests NONE   Final   Culture  Setup Time     Final   Value: 11/24/2012 16:42     Performed at [facility] Count     Final   Value: NO GROWTH     Performed at [facility]   Culture     Final   Value: NO GROWTH     Performed at [facility]   Report Status 11/25/2012 FINAL   Final  MRSA PCR SCREENING     Status: None   Collection Time    11/24/12  6:33 PM      Result Value Range Status   MRSA by  PCR NEGATIVE  NEGATIVE Final   Comment:            The GeneXpert MRSA Assay (FDA     approved for NASAL specimens     only), is one component of a     comprehensive MRSA colonization     surveillance program. It is not     intended to diagnose MRSA     infection nor to guide or     monitor treatment for     MRSA infections.    Janaiah Vetrano,MD [phone] 11/27/2012, 4:26 PM    On-Call/Text Page:      [email or web address]      password [email or web address]  If 7PM-7AM, please contact night-coverage [email or web address] Password [email or web address] 12/01/2012, 6:00 PM   LOS: 7 days

## 2012-12-01 NOTE — Discharge Summary (Signed)
Physician Discharge Summary  Danny Arnold ZOX:[id number] DOB: Nov 24, 1975 DOA: 11/24/2012  PCP: [name], MD  Admit date: 11/24/2012 Discharge date: 12/02/2012  Time spent: 40 minutes  Recommendations for Outpatient Follow-up:  1. Follow up PCP in one week 2. Follow up with [facility] in one week.  3. Follow upwith cbc and bmp in 1 to 2 days.  4. Follow up with [facility], if you have any signs of bleeding  5. Follow up with Dr Maisie Fus as recommended  6. You will need referral to tertiary center for possible TIPS.   Discharge Diagnoses:  Rectal bleed from hemorrhoids.   Active Problems:   Cirrhosis, alcoholic   Hyponatremia   Leucocytosis   GI bleed   Acute blood loss anemia   Abdominal pain   Alcoholic hepatitis   Portal hypertension   Esophageal varices in alcoholic cirrhosis   Metabolic acidosis     Diet recommendation: regular  Filed Weights   11/24/12 1255 11/25/12 0425  Weight: 73.936 kg (163 lb) 68.5 kg (151 lb 0.2 oz)    History of present illness:  37 year old male patient with known alcoholic hepatitis with underlying cirrhosis, baseline total bilirubin around 20. Initially admitted in September of this year with acute jaundice due to same problems. Evaluated by Gastroenterology during that admission and complete alcohol cessation was recommended. Apparently had been in usual state of health until the date of admission when he developed massive rectal bleeding. The patient reported he sat on the toilet for around 1.5 hours but when attempted to stand up he became very dizzy and lightheaded but did not have a syncopal episode. He had some mild abdominal discomfort but no constitutional symptoms. He was admitted for GI bleed. AND GI consulted. He underwent EGD, and colonoscopy. The bleeding was not explained. The colonoscopy revealed few internal hemorrhoids. He required a total of 8 units of prbc transfusions during this  hospitalization. His H&H is around 7 /22.    Hospital Course:   Cirrhosis, alcoholic/Portal hypertension:  - improving liver function tests. He was initially started on octreotide, later discontinued by GI. Hepatitis panel negative. ANA, AMA, cerulopasmin and alpha-1 anti trypsin negative so far.   GI bleed/hematochezia  -upper/lower endoscopy revealed only a few internal hemorrhoids and non bleeding varices- bleeding not well explained  - but hgb drifted down despite 6 units of PRBC transfusions. His biopsy show negative H pylori.  HIS h&h has been stable in  the last 3 days. His rectal bleeding has improved. RN and the patient does not reports any blood in the stools. He is recommended to follow up with GI and possible referral to surgery for hemorrhoidal surgery in the future. Recommend checking his hemoglobin in 1 to 2 days. Surgery consulted for hemorrhoidal bleeding. Recommended outpatient follow up with Dr Maisie Fus. He will need a referral to tertiary center for possible TIPS in the near future.   Metabolic acidosis  -?? Etiology . 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  -ABG PCO2 and bicarb low but pH normal which is suggestive of a compensated metabolic acidosis- lactic acid 2.9- repeat in am shows normal..  -no current abdominal pain so doubt mesenteric ischemia and also no ischemic findings on colonoscopy  - ? From decompensated liver cirrhosis.  - discussed with  Dr Eliott Nine, recommended to increase the sodium bicarbonate to 4 times a day.  - follow up with [facility] in 1 to 2 weeks.   Hyponatremia  -chronic problem is stable   Leukocytosis  -  trend down - no fever - likely leukomoid rxn  -had leukocytosis last admit due to citrobacter bacteremia- no fever and has been on empiric anbx's-blood cx's from 10/12 NGTD  -smear showed lymphocytosis and neutrophilia, possibly a reactive process. His pro calcitonin level is also negative.  Abdominal pain  -doubt SBP but will cont  empiric coverage as precaution given leukocytosis  -has resolved  Elevated lipase levels/ metabolic acidosis:  CT abd and pelvis was negative for pancreatitis.  He is asymptomatic.   Anemia: normocytic, from a combination of rectal bleeding and anemia of chronic disease from the cirrhosis of the liver. His haptoglobin and LDH are normal, no hemolysis .    Procedures:  CT abdomen & Pelvis.   EGD  ENDOSCOPIC IMPRESSION:  first grade esophageal varices. One of straining. No stigmata of  bleeding  No blood in the stomach or duodenum  Mild-to-moderate duodenitis. Status post biopsies for H. pylori.  There is nothing to account for patient's GI blood loss  Colonoscopy  ENDOSCOPIC IMPRESSION:  Moderate sized internal hemorrhoids  no blood in colon  No other lesions to explain severe anemia    Consultations:  Gastroenterology  Spoke to Dr Eliott Nine over the phone for metabolic acidosis  Discharge Exam: Filed Vitals:   12/02/12 0500  BP: 120/73  Pulse: 104  Temp: 97.8 F (36.6 C)  Resp:    General: No acute respiratory distress-jaundiced  Lungs: Clear to auscultation bilaterally without wheezes or crackles, RA  Cardiovascular: Regular rate and rhythm without murmur gallop or rub normal S1 and S2, no peripheral edema or JVD  Abdomen: Nontender, slightly distended, soft, bowel sounds positive, no rebound, no ascites, no appreciable mass  Musculoskeletal: No significant cyanosis, clubbing of bilateral lower extremities  Neurological: Alert, moves all extremities x 4 without focal neurological deficits,    Discharge Instructions      Discharge Orders   Future Appointments Provider Department Dept Phone   12/10/2012 12:05 PM Romie Levee, MD [facility], [place] [phone]   12/12/2012 11:30 AM [facility] Covering Provider 2 [facility] [phone]   12/12/2012 12:00 PM [facility] Financial Counselor [facility] [phone]   Future Orders Complete By Expires   Discharge instructions  As directed    Comments:     Follow up with PCP in one week. And repeat BMP at the office.  Please follow up with gastroenterology as recommended Please follow up with [facility] in 1 to 2 weeks, for metabolic acidosis.   Discharge instructions  As directed    Comments:     Follow up with Dr Maisie Fus as recommended.       Medication List    STOP taking these medications       ciprofloxacin 500 MG tablet  Commonly known as:  CIPRO     furosemide 20 MG tablet  Commonly known as:  LASIX     omeprazole 40 MG capsule  Commonly known as:  PRILOSEC     propranolol 10 MG tablet  Commonly known as:  INDERAL     spironolactone 100 MG tablet  Commonly known as:  ALDACTONE      TAKE these medications       DSS 100 MG Caps  Take 200 mg by mouth 2 (two) times daily.     feeding supplement (RESOURCE BREEZE) Liqd  Take 1 Container by mouth 3 (three) times daily between meals.     folic acid 1 MG  tablet  Commonly known as:  FOLVITE  Take 1 tablet (1 mg total) by mouth daily.     hydrocortisone 25 MG suppository  Commonly known as:  ANUSOL-HC  Place 1 suppository (25 mg total) rectally 2 (two) times daily as needed for hemorrhoids.     hydrOXYzine 10 MG tablet  Commonly known as:  ATARAX/VISTARIL  Take 1 tablet (10 mg total) by mouth 3 (three) times daily as needed for itching.     multivitamin with minerals Tabs tablet  Take 1 tablet by mouth daily.     polyethylene glycol packet  Commonly known as:  MIRALAX / GLYCOLAX  Take 17 g by mouth 2 (two) times daily.     potassium chloride SA 20 MEQ tablet  Commonly known as:  K-DUR,KLOR-CON  Take 2 tablets (40 mEq total) by mouth daily.     pramoxine 1 % foam  Commonly known as:  PROCTOFOAM  Place rectally 3 (three) times daily as needed.     sodium bicarbonate 650 MG tablet  Take 2 tablets (1,300 mg total) by mouth 3 (three)  times daily.     thiamine 100 MG tablet  Take 1 tablet (100 mg total) by mouth daily.       No Known Allergies Follow-up Information   Follow up with [facility] On 12/12/2012. (11:30 for hospital follow up, then orange card apt at 12:00 please bring completed paperwork)    Contact information:   [address] [place] [address] [phone]      Follow up with [facility]. Schedule an appointment as soon as possible for a visit in 1 week.   Contact information:   [address] [place] [address] [phone]      Follow up with Lina Sar, MD. (As needed, )    Specialty:  Gastroenterology   Contact information:   [address] [place] [address] [phone]       Follow up with [name]., MD On 12/10/2012. (12:05pm, arrive 11:40am)    Specialty:  General Surgery   Contact information:   [address] [place] [address] [phone]        The results of significant diagnostics from this hospitalization (including imaging, microbiology, ancillary and laboratory) are listed below for reference.    Significant Diagnostic Studies: Dg Chest 2 View  11/24/2012   CLINICAL DATA:  Shortness of breath, rectal bleeding.  EXAM: CHEST  2 VIEW  COMPARISON:  October 27, 2012.  FINDINGS: The heart size and mediastinal contours are within normal limits. Both lungs are clear. The visualized skeletal structures are unremarkable.  IMPRESSION: No active cardiopulmonary disease.   Electronically Signed   By: Roque Lias M.D.   On: 11/24/2012 14:48   US Abdomen Complete  11/24/2012   CLINICAL DATA:  Alcoholic hepatitis with jaundice and elevated enzymes, assess for ascites.  EXAM: ULTRASOUND ABDOMEN COMPLETE  COMPARISON:  CT abdomen 10/28/2012. Ultrasound of the abdomen 10/30/2012.  FINDINGS: Gallbladder  Sludge ball versus non shadowing stone in the neck. Negative sonographic Murphy sign. Abnormal  wall thickness of 4.7 mm.  Common bile duct  Diameter: No biliary ductal dilatation. Diameter 2.3 mm.  Liver  Echogenic and heterogeneous. Considerations include steatosis and or hepatitis. No focal defects.  IVC  No abnormality visualized.  Pancreas  Limited visualization, no abnormality.  Spleen  Splenomegaly with a length of 13.1 cm. Total volume 629 cc.  Right Kidney  Length: 11.7 cm. Echogenicity within normal limits. No mass or hydronephrosis visualized.  Left Kidney  Length: 11.8 cm Echogenicity within normal limits. No mass or hydronephrosis visualized.  Abdominal aorta  2.1 cm proximally. Distal obscured by bowel gas.  No evidence for abdominal ascites in all 4 quadrants.  IMPRESSION: Gallbladder wall thickening similar to priors. Sludge ball (likely) versus non shadowing stone not seen previously.  Echogenic and heterogeneous liver, question hepatitis versus steatosis.  No visible ascites.   Electronically Signed   By: Davonna Belling M.D.   On: 11/24/2012 20:48   Ct Abdomen Pelvis W Contrast  11/29/2012   CLINICAL DATA:  Mid abdominal pain.  EXAM: CT ABDOMEN AND PELVIS WITH CONTRAST  TECHNIQUE: Multidetector CT imaging of the abdomen and pelvis was performed using the standard protocol following bolus administration of intravenous contrast.  CONTRAST:  OMNIPAQUE IOHEXOL 300 MG/ML  SOLN  COMPARISON:  10/28/2012.  FINDINGS: The lung bases are clear of acute findings. Right basilar scarring changes noted.  The liver is mildly enlarged but stable. Heterogeneous liver parenchyma again noted and likely due to cirrhosis or hepatitis. The liver contour or is slightly irregular and the spleen is enlarged suggesting cirrhosis and portal hypertension. The portal and splenic veins are patent. The gallbladder wall is thickened and there of gallstones. This could be due to a low albumin or cholecystitis. The pancreas is unremarkable. No CT findings to suggest acute pancreatitis. The adrenal glands and kidneys  are normal.  The stomach, duodenum, small bowel and colon are unremarkable. No inflammatory changes, mass lesions or obstructive findings. No mesenteric or retroperitoneal mass or adenopathy. There is a small amount of free fluid in the abdomen and pelvis and mild mesenteric vascular congestion and or edema.  The aorta and branch vessels are normal. Small scattered mesenteric and retroperitoneal lymph nodes but no mass or adenopathy.  The bladder, prostate gland and seminal vesicles are unremarkable. No pelvic mass or adenopathy. Small amount of free pelvic fluid. No inguinal mass or hernia.  The bony structures are intact.  IMPRESSION: 1. CT findings suspicious for cirrhosis with portal hypertension, splenomegaly, mesenteric edema and a small amount of ascites. 2. Gallbladder wall thickening may be due to cirrhosis and low albumin. Small gallstones are noted. 3. No CT findings to suggest acute pancreatitis.   Electronically Signed   By: Loralie Champagne M.D.   On: 11/29/2012 18:40    Microbiology: Recent Results (from the past 240 hour(s))  CULTURE, BLOOD (ROUTINE X 2)     Status: None   Collection Time    11/24/12  2:10 PM      Result Value Range Status   Specimen Description BLOOD RIGHT ARM   Final   Special Requests     Final   Value: BOTTLES DRAWN AEROBIC AND ANAEROBIC 10CC BLUE 5CCRED   Culture  Setup Time     Final   Value: 11/24/2012 22:17     Performed at [facility]   Culture     Final   Value: NO GROWTH 5 DAYS     Performed at [facility]   Report Status 11/30/2012 FINAL   Final  CULTURE, BLOOD (ROUTINE X 2)     Status: None   Collection Time    11/24/12  2:20 PM      Result Value Range Status   Specimen Description BLOOD RIGHT HAND   Final   Special Requests BOTTLES DRAWN AEROBIC ONLY 10CC   Final  Culture  Setup Time     Final   Value: 11/24/2012 22:17     Performed at [facility]   Culture     Final   Value: NO GROWTH 5 DAYS     Performed at  [facility]   Report Status 11/30/2012 FINAL   Final  URINE CULTURE     Status: None   Collection Time    11/24/12  3:10 PM      Result Value Range Status   Specimen Description URINE, CLEAN CATCH   Final   Special Requests NONE   Final   Culture  Setup Time     Final   Value: 11/24/2012 16:42     Performed at [facility] Count     Final   Value: NO GROWTH     Performed at [facility]   Culture     Final   Value: NO GROWTH     Performed at [facility]   Report Status 11/25/2012 FINAL   Final  MRSA PCR SCREENING     Status: None   Collection Time    11/24/12  6:33 PM      Result Value Range Status   MRSA by PCR NEGATIVE  NEGATIVE Final   Comment:            The GeneXpert MRSA Assay (FDA     approved for NASAL specimens     only), is one component of a     comprehensive MRSA colonization     surveillance program. It is not     intended to diagnose MRSA     infection nor to guide or     monitor treatment for     MRSA infections.     Labs: Basic Metabolic Panel:  Recent Labs Lab 11/28/12 1140 11/29/12 1021 11/30/12 0648 12/01/12 0526 12/02/12 0835  NA 130* 131* 129* 132* 129*  K 3.3* 3.5 3.7 3.5 3.8  CL 105 107 107 108 104  CO2 13* 11* 12* 10* 12*  GLUCOSE 154* 109* 98 124* 112*  BUN 11 9 9 10 11   CREATININE 0.84 0.79 0.75 0.79 0.81  CALCIUM 7.8* 7.7* 7.7* 7.9* 7.8*   Liver Function Tests:  Recent Labs Lab 11/26/12 0500 11/27/12 0527 11/28/12 1140  AST 246* 165* 102*  ALT 87* 76* 62*  ALKPHOS 203* 186* 194*  BILITOT 16.9* 15.6* 16.7*  PROT 5.6* 5.4* 5.5*  ALBUMIN 1.4* 1.3* 1.4*    Recent Labs Lab 11/26/12 0500 11/28/12 1140 11/29/12 1021  LIPASE 203* 712* 665*   No results found for this basename: AMMONIA,  in the last 168 hours CBC:  Recent Labs Lab 11/26/12 0500 11/27/12 0527  11/28/12 1140 11/29/12 1021 11/30/12 0648 12/01/12 0526 12/02/12 0930  WBC 20.1* 20.0*  --  17.6*  --  20.8*  18.3*  --   NEUTROABS  --  15.1*  --   --   --   --   --   --   HGB 7.5* 6.9*  < > 8.0* 7.1* 7.7* 7.6* 7.3*  HCT 20.9* 18.8*  < > 22.9* 19.6* 21.5* 22.0* 21.1*  MCV 91.3 91.3  --  86.4  --  83.3 84.9  --   PLT 309 325  --  285  --  238 271  --   < > = values in this interval not displayed. Cardiac Enzymes:  Recent Labs Lab 12/01/12 2230 12/02/12 0343 12/02/12 1610  TROPONINI <0.30 <0.30 <0.30   BNP: BNP (last 3 results)  Recent Labs  11/25/12 0542  PROBNP 166.6*   CBG: No results found for this basename: GLUCAP,  in the last 168 hours     Signed:  Allecia Bells  [facility] 12/02/2012, 12:00 PM

## 2012-12-01 NOTE — Progress Notes (Signed)
CRITICAL VALUE ALERT  Critical value received:  CO2 18.2  Date of notification:  12/01/2012  Time of notification:1015   Critical value read back:yes  Nurse who received alert: Nena Polio  MD notified (1st page): Blake Divine  Time of first page:  1025  MD notified (2nd page):  Time of second page:  Responding MD:  Blake Divine   Time MD responded:  [phone]

## 2012-12-02 ENCOUNTER — Encounter (HOSPITAL_COMMUNITY): Payer: Self-pay | Admitting: General Surgery

## 2012-12-02 ENCOUNTER — Telehealth: Payer: Self-pay | Admitting: Emergency Medicine

## 2012-12-02 DIAGNOSIS — K649 Unspecified hemorrhoids: Secondary | ICD-10-CM

## 2012-12-02 DIAGNOSIS — K7460 Unspecified cirrhosis of liver: Secondary | ICD-10-CM

## 2012-12-02 LAB — HEMOGLOBIN AND HEMATOCRIT, BLOOD
HCT: 21.1 % — ABNORMAL LOW (ref 39.0–52.0)
Hemoglobin: 7.3 g/dL — ABNORMAL LOW (ref 13.0–17.0)

## 2012-12-02 LAB — BASIC METABOLIC PANEL
CO2: 12 mEq/L — ABNORMAL LOW (ref 19–32)
Calcium: 7.8 mg/dL — ABNORMAL LOW (ref 8.4–10.5)
Chloride: 104 mEq/L (ref 96–112)
GFR calc non Af Amer: >90 mL/min (ref >90)
Sodium: 129 mEq/L — ABNORMAL LOW (ref 135–145)

## 2012-12-02 LAB — TROPONIN I: Troponin I: <0.3 ng/mL (ref <0.30)

## 2012-12-02 MED ORDER — PRAMOXINE HCL 1 % RE FOAM
Freq: Three times a day (TID) | RECTAL | Status: DC | PRN
  Filled 2012-12-02: qty 15

## 2012-12-02 MED ORDER — PRAMOXINE HCL 1 % RE FOAM: Freq: Three times a day (TID) | RECTAL | Status: DC | PRN

## 2012-12-02 MED ORDER — SODIUM BICARBONATE 650 MG PO TABS: 1300.0000 mg | ORAL_TABLET | Freq: Four times a day (QID) | ORAL | Status: DC

## 2012-12-02 NOTE — Progress Notes (Signed)
Interpreter Tsutomu Barfoot Namihira for RN discharge instructions °

## 2012-12-02 NOTE — Consult Note (Signed)
Agree with PA note above.  Mariella Saa MD, FACS  12/02/2012 12:29 PM

## 2012-12-02 NOTE — Progress Notes (Signed)
This RN called to room for patient c/o chest pain. Pt found in bed with facial grimace, patient speaks minimal english, pacific interpreters called conversation translated by Interpreter number [phone]. Pt complaint of chest pain, pain description pressure 7/10. BP 104/60, pulse 100, EKG obtained. Upon further assessment pt states pain occurred after eating "fried chicken and corn". Notified T. Claiborne Billings, NP. NP on the unit to see the patient and review EKG, orders entered into [facility] by provider. Pt currently in bed, needs being addressed, will continue to monitor and assist as needed. Julien Nordmann [facility]

## 2012-12-02 NOTE — Progress Notes (Signed)
[facility] Progress Note   Danny Arnold FAO:[id number] DOB: May 13, 1975 DOA: 11/24/2012 PCP: [name], MD  Brief narrative: 37 year old male patient with known alcoholic hepatitis with underlying cirrhosis, baseline total bilirubin around 20. Initially admitted in September of this year with acute jaundice due to same problems. Evaluated by Gastroenterology during that admission and complete alcohol cessation was recommended. Apparently had been in usual state of health until the date of admission when he developed massive rectal bleeding. The patient reported he sat on the toilet for around 1.5 hours but when attempted to stand up he became very dizzy and lightheaded but did not have a syncopal episode. He had some mild abdominal discomfort but no constitutional symptoms. In the ER he was hyponatremic which is a chronic problem for him. He had metabolic acidosis with a bicarbonate of 9. Anion gap 15. Total bilirubin around 18.2. Lipase mildly elevated at 256. Hemoglobin was markedly decreased at 4.8 and his white count was markedly increased at 47,000. He was admitted for evaluation of rectal bleed. GI consulted. He underwent EGD, and colonoscopy. The bleeding was not explained. The colonoscopy revealed few internal hemorrhoids. He required a total of 8 units of prbc transfusions during this hospitalization. He reports some bleeding over the last 24 hours . Surgery consulted and initially recommended outpatient follow up with Dr Maisie Fus if the patient is discharged. Since the patient continues to have rectal, his discharge was discontinued. Currently awaiting surgery consultation to see if he is candidate for  Hemorrhoidectomy vs conservative management.    Assessment/Plan:    Cirrhosis, alcoholic/Portal hypertension:  - improving liver function tests.  -Octreotide dc'd by GI -GI endorsed from GI standpoint OK for dc (see below) -Hepatitis panel negative  -ANA, AMA, cerulopasmin  and alpha-1 anti trypsin negative so far.     GI bleed/hematochezia -upper/lower endoscopy revealed only a few internal hemorrhoids and non bleeding varices- bleeding not well explained - but hgb drifted down despite 6 units of PRBC transfusions.  -negative H. Pylori bx's - ordered another 2 units of prbc transfusion.  - repeat cbc showed improvement in hemoglobin. And his rectal bleeding has improved slightly.       Metabolic acidosis -?? Etiology -ABG PCO2 and bicarb low but pH normal which is suggestive of a compensated metabolic acidosis- lactic acid 2.9- repeat in am shows improvement.  -no current abdominal pain so doubt mesenteric ischemia and also no ischemic findings on colonoscopy - ? From decompensated liver cirrhosis.  - spoke to Dr Eliott Nine over the phone on 10/19 recommended to increase the sodium bicarbonate and outpatient follow up    Hyponatremia -chronic problem is stable    Leukocytosis -trend down - no fever - likely leukomoid rxn -had leukocytosis last admit due to citrobacter bacteremia- no fever and has been on empiric anbx's-blood cx's from 10/12 NGTD -smear showed lymphocytosis and neutrophilia, possibly a reactive process.     Abdominal pain -doubt SBP but will cont empiric coverage as precaution given leukocytosis -has resolved  Elevated lipase levels ? Pancreatitis.CT abd and pelvis was negative  for pancreatic necrosis. He is asymptomatic.   Anemia: normocytic, from a combination of rectal bleeding and anemia of chronic disease from the cirrhosis of the liver. His haptoglobin and LDH are normal, no hemolysis       DVT prophylaxis: SCDs Code Status: Full Family Communication: Patient only- conversation limited due to patient's minimal understanding of English-ultimately needed to use interpretor Disposition Plan/Expected LOS: possibly in am.   Consultants:  Gastroenterology  Procedures: EGD  ENDOSCOPIC IMPRESSION:  first grade esophageal  varices. One of straining. No stigmata of  bleeding  No blood in the stomach or duodenum  Mild-to-moderate duodenitis. Status post biopsies for H. pylori.  There is nothing to account for patient's GI blood loss  Colonoscopy  ENDOSCOPIC IMPRESSION:  Moderate sized internal hemorrhoids  no blood in colon  No other lesions to explain severe anemia   Antibiotics: Cefotaxime 10/12 >>>10/15  HPI/Subjective: Patient alert and denies abd pain, N/V/D, SOB or CP.his rectal bleeding has improved.   Objective: Blood pressure 120/73, pulse 104, temperature 97.8 F (36.6 C), temperature source Oral, resp. rate 18, height 5\' 7"  (1.702 m), weight 68.5 kg (151 lb 0.2 oz), SpO2 100.00%.  Intake/Output Summary (Last 24 hours) at 12/02/12 0913 Last data filed at 12/01/12 1519  Gross per 24 hour  Intake    360 ml  Output      1 ml  Net    359 ml   Exam: General: No acute respiratory distress-jaundiced Lungs: Clear to auscultation bilaterally without wheezes or crackles, RA Cardiovascular: Regular rate and rhythm without murmur gallop or rub normal S1 and S2, no peripheral edema or JVD Abdomen: Nontender, slightly distended, soft, bowel sounds positive, no rebound, no ascites, no appreciable mass Musculoskeletal: No significant cyanosis, clubbing of bilateral lower extremities Neurological: Alert, moves all extremities x 4 without focal neurological deficits, CN 2-12 intact  Scheduled Meds:  Scheduled Meds: . docusate sodium  200 mg Oral BID  . feeding supplement (RESOURCE BREEZE)  1 Container Oral TID BM  . folic acid  1 mg Oral Daily  . hydrocortisone  25 mg Rectal BID  . multivitamin with minerals  1 tablet Oral Daily  . pantoprazole  40 mg Oral Q0600  . potassium chloride SA  40 mEq Oral BID  . sodium bicarbonate  1,300 mg Oral QID  . thiamine  100 mg Oral Daily   Continuous Infusions:    Data Reviewed: Basic Metabolic Panel:  Recent Labs Lab 11/27/12 0527 11/28/12 1140  11/29/12 1021 11/30/12 0648 12/01/12 0526  NA 133* 130* 131* 129* 132*  K 3.4* 3.3* 3.5 3.7 3.5  CL 109 105 107 107 108  CO2 13* 13* 11* 12* 10*  GLUCOSE 107* 154* 109* 98 124*  BUN 14 11 9 9 10   CREATININE 1.01 0.84 0.79 0.75 0.79  CALCIUM 7.7* 7.8* 7.7* 7.7* 7.9*   Liver Function Tests:  Recent Labs Lab 11/26/12 0500 11/27/12 0527 11/28/12 1140  AST 246* 165* 102*  ALT 87* 76* 62*  ALKPHOS 203* 186* 194*  BILITOT 16.9* 15.6* 16.7*  PROT 5.6* 5.4* 5.5*  ALBUMIN 1.4* 1.3* 1.4*    Recent Labs Lab 11/26/12 0500 11/28/12 1140 11/29/12 1021  LIPASE 203* 712* 665*   No results found for this basename: AMMONIA,  in the last 168 hours CBC:  Recent Labs Lab 11/26/12 0500 11/27/12 0527  11/28/12 0541 11/28/12 1140 11/29/12 1021 11/30/12 0648 12/01/12 0526  WBC 20.1* 20.0*  --   --  17.6*  --  20.8* 18.3*  NEUTROABS  --  15.1*  --   --   --   --   --   --   HGB 7.5* 6.9*  < > 7.9* 8.0* 7.1* 7.7* 7.6*  HCT 20.9* 18.8*  < > 22.2* 22.9* 19.6* 21.5* 22.0*  MCV 91.3 91.3  --   --  86.4  --  83.3 84.9  PLT 309 325  --   --  285  --  238 271  < > = values in this interval not displayed.  Recent Results (from the past 240 hour(s))  CULTURE, BLOOD (ROUTINE X 2)     Status: None   Collection Time    11/24/12  2:10 PM      Result Value Range Status   Specimen Description BLOOD RIGHT ARM   Final   Special Requests     Final   Value: BOTTLES DRAWN AEROBIC AND ANAEROBIC 10CC BLUE 5CCRED   Culture  Setup Time     Final   Value: 11/24/2012 22:17     Performed at [facility]   Culture     Final   Value: NO GROWTH 5 DAYS     Performed at [facility]   Report Status 11/30/2012 FINAL   Final  CULTURE, BLOOD (ROUTINE X 2)     Status: None   Collection Time    11/24/12  2:20 PM      Result Value Range Status   Specimen Description BLOOD RIGHT HAND   Final   Special Requests BOTTLES DRAWN AEROBIC ONLY 10CC   Final   Culture  Setup Time     Final   Value:  11/24/2012 22:17     Performed at [facility]   Culture     Final   Value: NO GROWTH 5 DAYS     Performed at [facility]   Report Status 11/30/2012 FINAL   Final  URINE CULTURE     Status: None   Collection Time    11/24/12  3:10 PM      Result Value Range Status   Specimen Description URINE, CLEAN CATCH   Final   Special Requests NONE   Final   Culture  Setup Time     Final   Value: 11/24/2012 16:42     Performed at [facility] Count     Final   Value: NO GROWTH     Performed at [facility]   Culture     Final   Value: NO GROWTH     Performed at [facility]   Report Status 11/25/2012 FINAL   Final  MRSA PCR SCREENING     Status: None   Collection Time    11/24/12  6:33 PM      Result Value Range Status   MRSA by PCR NEGATIVE  NEGATIVE Final   Comment:            The GeneXpert MRSA Assay (FDA     approved for NASAL specimens     only), is one component of a     comprehensive MRSA colonization     surveillance program. It is not     intended to diagnose MRSA     infection nor to guide or     monitor treatment for     MRSA infections.    Uel Davidow,MD [phone] 11/27/2012, 4:26 PM    On-Call/Text Page:      [email or web address]      password [email or web address]  If 7PM-7AM, please contact night-coverage [email or web address] Password [email or web address] 12/02/2012, 9:13 AM   LOS: 8 days

## 2012-12-02 NOTE — Consult Note (Signed)
Danny Arnold 15-Oct-1975  [id number].   Primary Care MD: None Requesting MD: Dr. 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  Even with a Nurse, learning disability, the patient is a very poor historian.  He was noted to have a hgb of 4.8 on admission, likely secondary to his chronic bleeding hemorrhoids.  The patient has no idea how long he has been bleeding.  He states that he has a combination of BRBPR and some darker blood with his BMs.  He denies any abdominal pain or rectal pain.  Due to being anemic and heme + upon admission, GI proceeded with an EGD and c-scope.  He was found to have esophageal varices and internal hemorrhoids.  He did require transfusion.  His last unit of blood was 5 days ago and his hgb has been stable since then.  We have been asked to see him for other recommendations.   ROS: denies constipation, 3-4 BMs a day, no rectal pain, otherwise see HPI.  All other systems are currently negative except for lower extremity swelling.  Family History  Problem Relation Age of Onset  . Migraines Mother     Past Medical History  Diagnosis Date  . Jaundice   . Hyperbilirubinemia   . Anemia   . Alcoholic liver disease   . Bacteremia   . Cholangitis   . Hyponatremia   . Leucocytosis   . Alcoholic hepatitis     Past Surgical History  Procedure Laterality Date  . Hand surgery    . Colonoscopy N/A 11/25/2012    Procedure: COLONOSCOPY;  Surgeon: Hart Carwin, MD;  Location: [facility] ENDOSCOPY;  Service: Endoscopy;  Laterality: N/A;  . Esophagogastroduodenoscopy N/A 11/25/2012    Procedure: ESOPHAGOGASTRODUODENOSCOPY (EGD);  Surgeon: Hart Carwin, MD;  Location: [facility] ENDOSCOPY;  Service: Endoscopy;  Laterality: N/A;    Social History:  reports that he has never smoked.  He does not have any smokeless tobacco history on file. He reports that he drinks alcohol. He reports that he does not use illicit drugs.  Allergies: No Known Allergies  Medications Prior to Admission  Medication Sig Dispense Refill  . feeding supplement (RESOURCE BREEZE) LIQD Take 1 Container by mouth 3 (three) times daily between meals.  90 Container  0  . Docusate Sodium (DSS) 100 MG CAPS Take 200 mg by mouth 2 (two) times daily.  120 each  2  . folic acid (FOLVITE) 1 MG tablet Take 1 tablet (1 mg total) by mouth daily.  30 tablet  0  . hydrocortisone (ANUSOL-HC) 25 MG suppository Place 1 suppository (25 mg total) rectally 2 (two) times daily as needed for hemorrhoids.  24 suppository  4  . hydrOXYzine (ATARAX/VISTARIL) 10 MG tablet Take 1 tablet (10 mg total) by mouth 3 (three) times daily as needed for itching.  90 tablet  3  . Multiple Vitamin (MULTIVITAMIN WITH MINERALS) TABS tablet Take 1 tablet by mouth daily.  30 tablet  0  . polyethylene glycol (MIRALAX / GLYCOLAX) packet Take 17 g by mouth 2 (two) times daily.  30 each  4  . potassium chloride SA (K-DUR,KLOR-CON) 20 MEQ tablet Take 2 tablets (40 mEq total) by mouth daily.  60 tablet  2  . thiamine 100 MG tablet Take 1 tablet (100 mg total)  by mouth daily.  30 tablet  0  . [DISCONTINUED] ciprofloxacin (CIPRO) 500 MG tablet Take 1 tablet (500 mg total) by mouth 2 (two) times daily.  18 tablet  0  . [DISCONTINUED] furosemide (LASIX) 20 MG tablet Take 1 tablet (20 mg total) by mouth daily.  30 tablet  2  . [DISCONTINUED] omeprazole (PRILOSEC) 40 MG capsule Take 1 capsule (40 mg total) by mouth daily.  30 capsule  2  . [DISCONTINUED] propranolol (INDERAL) 10 MG tablet Take 1 tablet (10 mg total) by mouth 2 (two) times daily.  60 tablet  2  . [DISCONTINUED] sodium bicarbonate 650 MG tablet Take 2 tablets (1,300 mg total) by mouth 2 (two) times daily.  120 tablet  1  . [DISCONTINUED] spironolactone (ALDACTONE) 100 MG tablet Take 1 tablet  (100 mg total) by mouth daily.  30 tablet  2    Blood pressure 120/73, pulse 104, temperature 97.8 F (36.6 C), temperature source Oral, resp. rate 18, height 5\' 7"  (1.702 m), weight 151 lb 0.2 oz (68.5 kg), SpO2 100.00%. Physical Exam: General: WD, WN Hispanic male who is laying in bed in NAD HEENT: head is normocephalic, atraumatic.  Sclera are icteric.  PERRL.  Ears and nose without any masses or lesions.  Mouth is pink and moist Heart: regular, rate, and rhythm.  Normal s1,s2. No obvious murmurs, gallops, or rubs noted.  Palpable radial and pedal pulses bilaterally Lungs: CTAB, no wheezes, rhonchi, or rales noted.  Respiratory effort nonlabored Abd: soft, NT, mild distention likely secondary to some ascites, +BS, no masses or organomegaly. Reducible umbilical hernia Rectal: no external hemorrhoids visible.  DRE deferred as patient just had coloscopy to confirm internal hemorrhoids  MS: all 4 extremities are symmetrical with no cyanosis, clubbing.  Bilateral lower extremity edema Skin: warm and dry with no masses, lesions, or rashes; however he is very jaundiced Psych: A&Ox3 however, he does not answer all of my questions appropriately, unclear if it's lost in translation or he simply doesn't understand    Results for orders placed during the hospital encounter of 11/24/12 (from the past 48 hour(s))  CBC     Status: Abnormal   Collection Time    12/01/12  5:26 AM      Result Value Range   WBC 18.3 (*) 4.0 - 10.5 K/uL   RBC 2.59 (*) 4.22 - 5.81 MIL/uL   Hemoglobin 7.6 (*) 13.0 - 17.0 g/dL   HCT 40.9 (*) 81.1 - 91.4 %   MCV 84.9  78.0 - 100.0 fL   MCH 29.3  26.0 - 34.0 pg   MCHC 34.5  30.0 - 36.0 g/dL   RDW 78.2 (*) 95.6 - 21.3 %   Platelets 271  150 - 400 K/uL  BASIC METABOLIC PANEL     Status: Abnormal   Collection Time    12/01/12  5:26 AM      Result Value Range   Sodium 132 (*) 135 - 145 mEq/L   Potassium 3.5  3.5 - 5.1 mEq/L   Chloride 108  96 - 112 mEq/L   CO2 10 (*) 19  - 32 mEq/L   Comment: CRITICAL RESULT CALLED TO, READ BACK BY AND VERIFIED WITH:     ABERNATHY V,RN 12/01/12 0640 WAYK   Glucose, Bld 124 (*) 70 - 99 mg/dL   BUN 10  6 - 23 mg/dL   Creatinine, Ser 0.86  0.50 - 1.35 mg/dL   Comment: ICTERUS AT THIS LEVEL MAY AFFECT RESULT  Calcium 7.9 (*) 8.4 - 10.5 mg/dL   GFR calc non Af Amer >90  >90 mL/min   GFR calc Af Amer >90  >90 mL/min   Comment: (NOTE)     The eGFR has been calculated using the CKD EPI equation.     This calculation has not been validated in all clinical situations.     eGFR's persistently <90 mL/min signify possible Chronic Kidney     Disease.  PROCALCITONIN     Status: None   Collection Time    12/01/12  5:26 AM      Result Value Range   Procalcitonin 0.67     Comment:            Interpretation:     PCT > 0.5 ng/mL and <= 2 ng/mL:     Systemic infection (sepsis) is possible,     but other conditions are known to elevate     PCT as well.     (NOTE)             ICU PCT Algorithm               Non ICU PCT Algorithm        ----------------------------     ------------------------------             PCT < 0.25 ng/mL                 PCT < 0.1 ng/mL         Stopping of antibiotics            Stopping of antibiotics           strongly encouraged.               strongly encouraged.        ----------------------------     ------------------------------           PCT level decrease by               PCT < 0.25 ng/mL           >= 80% from peak PCT           OR PCT 0.25 - 0.5 ng/mL          Stopping of antibiotics                                                 encouraged.         Stopping of antibiotics               encouraged.        ----------------------------     ------------------------------           PCT level decrease by              PCT >= 0.25 ng/mL           < 80% from peak PCT            AND PCT >= 0.5 ng/mL            Continuing antibiotics  encouraged.            Continuing antibiotics                encouraged.        ----------------------------     ------------------------------         PCT level increase compared          PCT > 0.5 ng/mL             with peak PCT AND              PCT >= 0.5 ng/mL             Escalation of antibiotics                                              strongly encouraged.          Escalation of antibiotics            strongly encouraged.  LACTIC ACID, PLASMA     Status: None   Collection Time    12/01/12  8:33 AM      Result Value Range   Lactic Acid, Venous 1.7  0.5 - 2.2 mmol/L  BLOOD GAS, ARTERIAL     Status: Abnormal   Collection Time    12/01/12  9:55 AM      Result Value Range   FIO2 0.21     pH, Arterial 7.385  7.350 - 7.450   pCO2 arterial 18.2 (*) 35.0 - 45.0 mmHg   Comment: CRITICAL RESULT CALLED TO, READ BACK BY AND VERIFIED WITH:     SHANITA LEE,RN @1001  BY MORGAN CLOSSON,RRT,RCP ON 12/01/2012   pO2, Arterial 115.0 (*) 80.0 - 100.0 mmHg   Bicarbonate 10.6 (*) 20.0 - 24.0 mEq/L   TCO2 11.2  0 - 100 mmol/L   Acid-base deficit 13.6 (*) 0.0 - 2.0 mmol/L   O2 Saturation 98.5     Patient temperature 99.2     Collection site RIGHT RADIAL     Drawn by [id number]     Sample type ARTERIAL DRAW     Allens test (pass/fail) PASS  PASS  TROPONIN I     Status: None   Collection Time    12/01/12 10:30 PM      Result Value Range   Troponin I <0.30  <0.30 ng/mL   Comment:            Due to the release kinetics of cTnI,     a negative result within the first hours     of the onset of symptoms does not rule out     myocardial infarction with certainty.     If myocardial infarction is still suspected,     repeat the test at appropriate intervals.  TROPONIN I     Status: None   Collection Time    12/02/12  3:43 AM      Result Value Range   Troponin I <0.30  <0.30 ng/mL   Comment:            Due to the release kinetics of cTnI,     a negative result within the first hours     of the onset of symptoms does  not rule out     myocardial infarction with certainty.     If myocardial infarction is still suspected,  repeat the test at appropriate intervals.  TROPONIN I     Status: None   Collection Time    12/02/12  8:35 AM      Result Value Range   Troponin I <0.30  <0.30 ng/mL   Comment:            Due to the release kinetics of cTnI,     a negative result within the first hours     of the onset of symptoms does not rule out     myocardial infarction with certainty.     If myocardial infarction is still suspected,     repeat the test at appropriate intervals.  BASIC METABOLIC PANEL     Status: Abnormal   Collection Time    12/02/12  8:35 AM      Result Value Range   Sodium 129 (*) 135 - 145 mEq/L   Potassium 3.8  3.5 - 5.1 mEq/L   Chloride 104  96 - 112 mEq/L   CO2 12 (*) 19 - 32 mEq/L   Glucose, Bld 112 (*) 70 - 99 mg/dL   BUN 11  6 - 23 mg/dL   Creatinine, Ser 9.81  0.50 - 1.35 mg/dL   Comment: ICTERUS AT THIS LEVEL MAY AFFECT RESULT   Calcium 7.8 (*) 8.4 - 10.5 mg/dL   GFR calc non Af Amer >90  >90 mL/min   GFR calc Af Amer >90  >90 mL/min   Comment: (NOTE)     The eGFR has been calculated using the CKD EPI equation.     This calculation has not been validated in all clinical situations.     eGFR's persistently <90 mL/min signify possible Chronic Kidney     Disease.  HEMOGLOBIN AND HEMATOCRIT, BLOOD     Status: Abnormal   Collection Time    12/02/12  9:30 AM      Result Value Range   Hemoglobin 7.3 (*) 13.0 - 17.0 g/dL   HCT 19.1 (*) 47.8 - 29.5 %   No results found.     Assessment/Plan 1. Child Pugh C cirrhosis 2. Bleeding Internal hemorrhoids, likely secondary to #1 3. Esophageal varices, secondary to #1 4. Suspect portal HTN 5. anemia secondary to #2 6. Deranged LFTs, secondary to #1  Plan: 1. The patient would benefit from seeing our colorectal specialist, Dr. Maisie Fus, in our office for further evaluation and planning.  The patient has severe cirrhosis,  likely with portal HTN, that is contributing to his varices and hemorrhoids.  The patient may ultimately benefit from a TIPPS procedure to reduce pressure to help with his hemorrhoids.  Will defer that and other decision making to Dr. Maisie Fus.  The patient's hgb is currently stable.  I have scheduled him to see her next Tuesday for a follow up appointment.  Lillar Bianca E 12/02/2012, 11:05 AM Pager: [phone]

## 2012-12-02 NOTE — Care Management Note (Signed)
    Page 1 of 1   12/02/2012     5:34:29 PM   CARE MANAGEMENT NOTE 12/02/2012  Patient:  [name]   Account Number:  [id number]  Date Initiated:  12/02/2012  Documentation initiated by:  Letha Cape  Subjective/Objective Assessment:   dx gib  admit- lives wiht family.  pta indep. Pateint has used Match program already, not eligible.     Action/Plan:   Anticipated DC Date:  12/02/2012   Anticipated DC Plan:  HOME/SELF CARE      DC Planning Services  CM consult  [facility]      Choice offered to / List presented to:             Status of service:  Completed, signed off Medicare Important Message given?   (If response is "NO", the following Medicare IM given date fields will be blank) Date Medicare IM given:   Date Additional Medicare IM given:    Discharge Disposition:  HOME/SELF CARE  Per UR Regulation:  Reviewed for med. necessity/level of care/duration of stay  If discussed at Long Length of Stay Meetings, dates discussed:    Comments:  12/02/12 17:31 Letha Cape RN,BSN [phone] patient lives with family, pta indep.  Patient is not eligible for Match Program, he used on previous admission. Patient will need to go to [facility] on 10/21 to get a lab draw.  I called interpreter line and spoke with one of the spanish interpreters, she will contact patient to let him know to go to the Clinic tomorrow.  Patient stated that he could also get his medications.  Patient for dc today.

## 2012-12-02 NOTE — Telephone Encounter (Signed)
Nurse Stanton Kidney from [address] called for verbal orders per Dr. Blake Divine. Pt has scheduled appt to [facility] 12[facility]12/12 but doctor wants blood work done prior to visit Verbal order given  BMP H/H Pt may walk in for labs any time

## 2012-12-02 NOTE — Progress Notes (Signed)
NURSING PROGRESS NOTE  Danny Arnold [id number] Discharge Data: 12/02/2012 3:23 PM Attending Provider: Kathlen Mody, MD JYN:WGNFAO, Keane Scrape, MD     Danny Arnold to be D/C'd Home per MD order. Instructions reviewed with interpreter. All questions answered.     All IV's discontinued with no bleeding noted.  All belongings returned to patient for patient to take home.   Last Vital Signs:  Blood pressure 120/73, pulse 104, temperature 97.8 F (36.6 C), temperature source Oral, resp. rate 18, height 5\' 7"  (1.702 m), weight 68.5 kg (151 lb 0.2 oz), SpO2 100.00%.  Discharge Medication List   Medication List    STOP taking these medications       ciprofloxacin 500 MG tablet  Commonly known as:  CIPRO     furosemide 20 MG tablet  Commonly known as:  LASIX     omeprazole 40 MG capsule  Commonly known as:  PRILOSEC     propranolol 10 MG tablet  Commonly known as:  INDERAL     spironolactone 100 MG tablet  Commonly known as:  ALDACTONE      TAKE these medications       DSS 100 MG Caps  Take 200 mg by mouth 2 (two) times daily.     feeding supplement (RESOURCE BREEZE) Liqd  Take 1 Container by mouth 3 (three) times daily between meals.     folic acid 1 MG tablet  Commonly known as:  FOLVITE  Take 1 tablet (1 mg total) by mouth daily.     hydrocortisone 25 MG suppository  Commonly known as:  ANUSOL-HC  Place 1 suppository (25 mg total) rectally 2 (two) times daily as needed for hemorrhoids.     hydrOXYzine 10 MG tablet  Commonly known as:  ATARAX/VISTARIL  Take 1 tablet (10 mg total) by mouth 3 (three) times daily as needed for itching.     multivitamin with minerals Tabs tablet  Take 1 tablet by mouth daily.     polyethylene glycol packet  Commonly known as:  MIRALAX / GLYCOLAX  Take 17 g by mouth 2 (two) times daily.     potassium chloride SA 20 MEQ tablet  Commonly known as:  K-DUR,KLOR-CON  Take 2 tablets (40 mEq total) by mouth daily.     pramoxine 1 % foam  Commonly known as:  PROCTOFOAM  Place rectally 3 (three) times daily as needed.     sodium bicarbonate 650 MG tablet  Take 2 tablets (1,300 mg total) by mouth 4 (four) times daily.     thiamine 100 MG tablet  Take 1 tablet (100 mg total) by mouth daily.        Madelin Rear, MSN, RN, [employer]

## 2012-12-03 ENCOUNTER — Ambulatory Visit: Payer: No Typology Code available for payment source | Attending: Internal Medicine

## 2012-12-03 VITALS — BP 104/64 | HR 105 | Temp 98.5°F | Resp 16

## 2012-12-03 DIAGNOSIS — Z299 Encounter for prophylactic measures, unspecified: Secondary | ICD-10-CM

## 2012-12-03 NOTE — Progress Notes (Unsigned)
Pt here for pre-op blood work per hospital orders. Pt was d/c'd from [facility] hospital 12/02/12. Has establish care appt 12/12/12. Pt given reminder appt.

## 2012-12-04 LAB — BASIC METABOLIC PANEL
Calcium: 7.6 mg/dL — ABNORMAL LOW (ref 8.4–10.5)
Chloride: 111 mEq/L (ref 96–112)
Creat: 0.9 mg/dL (ref 0.50–1.35)
Glucose, Bld: 74 mg/dL (ref 70–99)

## 2012-12-04 LAB — CBC WITH DIFFERENTIAL/PLATELET
Hemoglobin: 7.4 g/dL — ABNORMAL LOW (ref 13.0–17.0)
Lymphocytes Relative: 15 % (ref 12–46)
Lymphs Abs: 2.5 10*3/uL (ref 0.7–4.0)
Monocytes Relative: 11 % (ref 3–12)
Neutro Abs: 11.5 10*3/uL — ABNORMAL HIGH (ref 1.7–7.7)
Neutrophils Relative %: 72 % (ref 43–77)
RBC: 2.55 MIL/uL — ABNORMAL LOW (ref 4.22–5.81)
WBC: 16.2 10*3/uL — ABNORMAL HIGH (ref 4.0–10.5)

## 2012-12-10 ENCOUNTER — Encounter (INDEPENDENT_AMBULATORY_CARE_PROVIDER_SITE_OTHER): Payer: Self-pay | Admitting: General Surgery

## 2012-12-10 ENCOUNTER — Encounter (INDEPENDENT_AMBULATORY_CARE_PROVIDER_SITE_OTHER): Payer: Self-pay

## 2012-12-10 ENCOUNTER — Ambulatory Visit (INDEPENDENT_AMBULATORY_CARE_PROVIDER_SITE_OTHER): Payer: Self-pay | Admitting: General Surgery

## 2012-12-10 VITALS — BP 140/64 | HR 78 | Temp 97.4°F | Resp 14 | Ht 66.5 in | Wt 167.2 lb

## 2012-12-10 DIAGNOSIS — K648 Other hemorrhoids: Secondary | ICD-10-CM

## 2012-12-10 NOTE — Progress Notes (Signed)
Chief Complaint  Patient presents with  . Routine Post Op    hems    HISTORY: Danny Arnold is a 37 y.o. male who presents to the office with rectal bleeding. This had been occurring for ~2 months.  He has alcoholic hepatitis and liver cirrhosis.  His baseline INR seems to be 1.5-1.7.  He has noticed minimal bleeding recently.  He has had 2 episodes of severe rectal bleeding requiring hospitalization and transfusion.  His recent colonoscopy showed mild-moderate internal hemorrhoid disease.    Past Medical History  Diagnosis Date  . Jaundice   . Hyperbilirubinemia   . Anemia   . Alcoholic liver disease   . Bacteremia   . Cholangitis   . Hyponatremia   . Leucocytosis   . Alcoholic hepatitis       Past Surgical History  Procedure Laterality Date  . Hand surgery    . Colonoscopy N/A 11/25/2012    Procedure: COLONOSCOPY;  Surgeon: Hart Carwin, MD;  Location: [facility] ENDOSCOPY;  Service: Endoscopy;  Laterality: N/A;  . Esophagogastroduodenoscopy N/A 11/25/2012    Procedure: ESOPHAGOGASTRODUODENOSCOPY (EGD);  Surgeon: Hart Carwin, MD;  Location: [facility] ENDOSCOPY;  Service: Endoscopy;  Laterality: N/A;        Current Outpatient Prescriptions  Medication Sig Dispense Refill  . Docusate Sodium (DSS) 100 MG CAPS Take 200 mg by mouth 2 (two) times daily.  120 each  2  . feeding supplement (RESOURCE BREEZE) LIQD Take 1 Container by mouth 3 (three) times daily between meals.  90 Container  0  . folic acid (FOLVITE) 1 MG tablet Take 1 tablet (1 mg total) by mouth daily.  30 tablet  0  . hydrocortisone (ANUSOL-HC) 25 MG suppository Place 1 suppository (25 mg total) rectally 2 (two) times daily as needed for hemorrhoids.  24 suppository  4  . hydrOXYzine (ATARAX/VISTARIL) 10 MG tablet Take 1 tablet (10 mg total) by mouth 3 (three) times daily as needed for itching.  90 tablet  3  . Multiple Vitamin (MULTIVITAMIN WITH MINERALS) TABS tablet Take 1 tablet by mouth daily.  30 tablet  0  . polyethylene  glycol (MIRALAX / GLYCOLAX) packet Take 17 g by mouth 2 (two) times daily.  30 each  4  . potassium chloride SA (K-DUR,KLOR-CON) 20 MEQ tablet Take 2 tablets (40 mEq total) by mouth daily.  60 tablet  2  . pramoxine (PROCTOFOAM) 1 % foam Place rectally 3 (three) times daily as needed.  15 g  0  . sodium bicarbonate 650 MG tablet Take 2 tablets (1,300 mg total) by mouth 4 (four) times daily.  120 tablet  1  . thiamine 100 MG tablet Take 1 tablet (100 mg total) by mouth daily.  30 tablet  0   No current facility-administered medications for this visit.      No Known Allergies    Family History  Problem Relation Age of Onset  . Migraines Mother     History   Social History  . Marital Status: Single    Spouse Name: N/A    Number of Children: N/A  . Years of Education: N/A   Social History Main Topics  . Smoking status: Never Smoker   . Smokeless tobacco: None  . Alcohol Use: Yes     Comment: heavy etoh until 02/2012- since then occasional  . Drug Use: No  . Sexual Activity: None   Other Topics Concern  . None   Social History Narrative  . None  REVIEW OF SYSTEMS - PERTINENT POSITIVES ONLY: Review of Systems - General ROS: negative for - chills, fever or weight loss Hematological and Lymphatic ROS: negative for - bleeding problems, blood clots or bruising Respiratory ROS: no cough, shortness of breath, or wheezing Cardiovascular ROS: no chest pain or dyspnea on exertion Gastrointestinal ROS: no abdominal pain, change in bowel habits, or black or bloody stools Genito-Urinary ROS: no dysuria, trouble voiding, or hematuria  EXAM: Filed Vitals:   12/10/12 1213  BP: 140/64  Pulse: 78  Temp: 97.4 F (36.3 C)  Resp: 14    General appearance: alert and cooperative Resp: clear to auscultation bilaterally Cardio: regular rate and rhythm GI: normal findings: soft, non-tender obvious jaundice    Procedure: Anoscopy Surgeon: Maisie Fus Diagnosis: anal bleeding   Assistant: Christella Scheuermann After the risks and benefits were explained, verbal consent was obtained for above procedure  Anesthesia: none Findings: Grade 1 RA and RP hem, Grade 2 LL hemorrhoid, mild anal irritation     The patient's symptoms are not adequately controlled.  Therefore, I recommended banding to treat the hemorrhoids.  I went over the technique, risks, benefits, and alternatives.   Risks and benefits of banding were explained to patient through a translator.  These include bleeding, pain, swelling and recurrence.  He indicated he understood the risks and agreed to proceed. Goals of post-operative recovery were discussed as well.  Questions were answered.  The patient expressed understanding & wished to proceed.  The patient was positioned in the prone position on the proctology table.  Perianal & rectal examination was done.  Using anoscopy, I ligated the hemorrhoids above the dentate line with banding.  LL lateral hemorrhoid banded with 1 rubber band without complications.  Pt tolerated reasonably well.   Educational handouts further explaining the pathology, treatment options, and bowel regimen were given as well.    ASSESSMENT AND PLAN: Danny Arnold is a 37 y.o. M with liver cirrhosis and a recent episode of severe rectal bleeding.  On exam, he did have a large LL hemorrhoid, and a band was placed without difficulty.  Pt to remain in the office for 15 mins.  Written instructions given.  I will see him back in 3-4 weeks.  Recommend high fiber diet.  Call the office if he develops severe rectal bleeding.      [name], MD Colon and Rectal Surgery / General Surgery [facility]      Visit Diagnoses: No diagnosis found.  Primary Care Physician: [name], MD

## 2012-12-10 NOTE — Patient Instructions (Addendum)
Patient Information following hemorrhoid banding   Hemorrhoid banding is a procedure that places a small rubber band around a hemorrhoid, causing it to clot and then break off and be passed in the stool.  This is a minor procedure, but problems may develop in rare cases.  The following are warning symptoms and signs that should alert you to a possible complication.  Please contact the office if any of these should occur:   Increased pain with bowel movements or sitting  Temperature over 100.4 F (oral)  Bleeding that is excessive (over one cup of clots or blood)  Redness or irritation outside the anus  Difficulty urinating  For your comfort please follow these instructions:   Maintain a high fiber diet so that your bowel movements will be soft  Take a fiber supplement twice a day (such as Metamucil, Benefiber, Citracel or Fibercon)  Sit in a tub of warm water 2-3 times a day for the first 2-3 days, as needed to soothe the area.  You may expect some pressure sensations in the anal area for 1-2 days  If you need pain medication, take Tylenol not aspirin or Ibuprofen products.  In 7-10 days, the banded tissue and rubber band will pass with your stool.  Occasionally there is some bleeding after this.  If this bleeding seems excessive, call the office immediately or go to the Emergency Room.    Make an appointment to see me in 2-3 weeks after the procedure       GETTING TO GOOD BOWEL HEALTH. Irregular bowel habits such as constipation can lead to many problems over time.  Having one soft bowel movement a day is the most important way to prevent further problems.  The anorectal canal is designed to handle stretching and feces to safely manage our ability to get rid of solid waste (feces, poop, stool) out of our body.  BUT, hard constipated stools can act like ripping concrete bricks causing inflamed hemorrhoids, anal fissures, abdominal pain and bloating.     The goal: ONE SOFT BOWEL  MOVEMENT A DAY!  To have soft, regular bowel movements:    Drink at least 8 tall glasses of water a day.     Take plenty of fiber.  Fiber is the undigested part of plant food that passes into the colon, acting s "natures broom" to encourage bowel motility and movement.  Fiber can absorb and hold large amounts of water. This results in a larger, bulkier stool, which is soft and easier to pass. Work gradually over several weeks up to 6 servings a day of fiber (25g a day even more if needed) in the form of: o Vegetables -- Root (potatoes, carrots, turnips), leafy green (lettuce, salad greens, celery, spinach), or cooked high residue (cabbage, broccoli, etc) o Fruit -- Fresh (unpeeled skin & pulp), Dried (prunes, apricots, cherries, etc ),  or stewed ( applesauce)  o Whole grain breads, pasta, etc (whole wheat)  o Bran cereals    Bulking Agents -- This type of water-retaining fiber generally is easily obtained each day by one of the following:  o Psyllium bran -- The psyllium plant is remarkable because its ground seeds can retain so much water. This product is available as Metamucil, Konsyl, Effersyllium, Per Diem Fiber, or the less expensive generic preparation in drug and health food stores. Although labeled a laxative, it really is not a laxative.  o Methylcellulose -- This is another fiber derived from wood which also retains water. It  is available as Citrucel. o Polyethylene Glycol - and "artificial" fiber commonly called Miralax or Glycolax.  It is helpful for people with gassy or bloated feelings with regular fiber o Flax Seed - a less gassy fiber than psyllium   No reading or other relaxing activity while on the toilet. If bowel movements take longer than 5 minutes, you are too constipated   AVOID CONSTIPATION.  High fiber and water intake usually takes care of this.  Sometimes a laxative is needed to stimulate more frequent bowel movements, but    Laxatives are not a good long-term solution as it  can wear the colon out. o Osmotics (Milk of Magnesia, Fleets phosphosoda, Magnesium citrate, MiraLax, GoLytely) are safer than  o Stimulants (Senokot, Castor Oil, Dulcolax, Ex Lax)    o Do not take laxatives for more than 7days in a row.    IF SEVERELY CONSTIPATED, try a Bowel Retraining Program: o Do not use laxatives.  o Eat a diet high in roughage, such as bran cereals and leafy vegetables.  o Drink six (6) ounces of prune or apricot juice each morning.  o Eat two (2) large servings of stewed fruit each day.  o Take one (1) heaping tablespoon of a psyllium-based bulking agent twice a day. Use sugar-free sweetener when possible to avoid excessive calories.  o Eat a normal breakfast.  o Set aside 15 minutes after breakfast to sit on the toilet, but do not strain to have a bowel movement.  o If you do not have a bowel movement by the third day, use an enema and repeat the above steps.

## 2012-12-12 ENCOUNTER — Ambulatory Visit: Payer: Self-pay

## 2012-12-20 ENCOUNTER — Ambulatory Visit: Payer: No Typology Code available for payment source | Attending: Internal Medicine

## 2012-12-20 ENCOUNTER — Other Ambulatory Visit: Payer: Self-pay

## 2012-12-20 DIAGNOSIS — Z0000 Encounter for general adult medical examination without abnormal findings: Secondary | ICD-10-CM

## 2012-12-20 MED ORDER — FOLIC ACID 1 MG PO TABS: 1.0000 mg | ORAL_TABLET | Freq: Every day | ORAL | Status: DC

## 2013-01-01 ENCOUNTER — Ambulatory Visit: Payer: No Typology Code available for payment source | Attending: Internal Medicine | Admitting: Internal Medicine

## 2013-01-01 ENCOUNTER — Encounter: Payer: Self-pay | Admitting: Internal Medicine

## 2013-01-01 VITALS — BP 123/76 | HR 107 | Temp 99.0°F | Resp 16 | Ht 67.0 in | Wt 157.0 lb

## 2013-01-01 DIAGNOSIS — D62 Acute posthemorrhagic anemia: Secondary | ICD-10-CM

## 2013-01-01 DIAGNOSIS — I8510 Secondary esophageal varices without bleeding: Secondary | ICD-10-CM

## 2013-01-01 DIAGNOSIS — K7030 Alcoholic cirrhosis of liver without ascites: Secondary | ICD-10-CM

## 2013-01-01 DIAGNOSIS — R609 Edema, unspecified: Secondary | ICD-10-CM | POA: Insufficient documentation

## 2013-01-01 DIAGNOSIS — K7010 Alcoholic hepatitis without ascites: Secondary | ICD-10-CM

## 2013-01-01 DIAGNOSIS — K766 Portal hypertension: Secondary | ICD-10-CM

## 2013-01-01 LAB — COMPREHENSIVE METABOLIC PANEL
Albumin: 2.4 g/dL — ABNORMAL LOW (ref 3.5–5.2)
Alkaline Phosphatase: 106 U/L (ref 39–117)
BUN: 5 mg/dL — ABNORMAL LOW (ref 6–23)
CO2: 18 mEq/L — ABNORMAL LOW (ref 19–32)
Calcium: 8.7 mg/dL (ref 8.4–10.5)
Chloride: 110 mEq/L (ref 96–112)
Glucose, Bld: 82 mg/dL (ref 70–99)
Potassium: 4.2 mEq/L (ref 3.5–5.3)
Sodium: 136 mEq/L (ref 135–145)
Total Protein: 7 g/dL (ref 6.0–8.3)

## 2013-01-01 MED ORDER — HYDROCORTISONE ACETATE 25 MG RE SUPP: 25.0000 mg | Freq: Two times a day (BID) | RECTAL | Status: DC | PRN

## 2013-01-01 MED ORDER — POLYETHYLENE GLYCOL 3350 17 G PO PACK: 17.0000 g | PACK | Freq: Two times a day (BID) | ORAL | Status: DC

## 2013-01-01 MED ORDER — SODIUM BICARBONATE 650 MG PO TABS: 1300.0000 mg | ORAL_TABLET | Freq: Four times a day (QID) | ORAL | Status: DC

## 2013-01-01 MED ORDER — FUROSEMIDE 20 MG PO TABS: 20.0000 mg | ORAL_TABLET | Freq: Every day | ORAL | Status: DC

## 2013-01-01 MED ORDER — HYDROXYZINE HCL 10 MG PO TABS: 10.0000 mg | ORAL_TABLET | Freq: Three times a day (TID) | ORAL | Status: DC | PRN

## 2013-01-01 MED ORDER — DSS 100 MG PO CAPS: 200.0000 mg | ORAL_CAPSULE | Freq: Two times a day (BID) | ORAL | Status: DC

## 2013-01-01 MED ORDER — PRAMOXINE HCL 1 % RE FOAM: Freq: Three times a day (TID) | RECTAL | Status: DC | PRN

## 2013-01-01 MED ORDER — POTASSIUM CHLORIDE CRYS ER 20 MEQ PO TBCR: 40.0000 meq | EXTENDED_RELEASE_TABLET | Freq: Every day | ORAL | Status: DC

## 2013-01-01 NOTE — Progress Notes (Unsigned)
Pt is here following up on the jaundice. Pt reports for 3 months his having swelling and pain in his ankles, lower back and his left knee and calf. He said that when he lays down on his left side that he has SOB.

## 2013-01-01 NOTE — Progress Notes (Unsigned)
Patient ID: Danny Arnold, male   DOB: Dec 06, 1975, 37 y.o.   MRN: [id number] Patient Demographics  Danny Arnold, is a 37 y.o. male  WNU:[id number]  IHK:[id number]  DOB - 05-28-1975  Chief Complaint  Patient presents with  . Follow-up        Subjective:   Danny Arnold today is here for a follow up visit. Patient is a 37 year old Hispanic male, accompanied with interpreter, history of alcoholic liver disease, hepatitis, anemia from internal hemorrhoids, presented for followup visit. Patient reports that he is considerably doing well these days, yellowness of the skin has improved significantly. Patient reports that his pruritus is improving He does have swelling in his feet and ankles up to the knees, has also started feeling some shortness of breath  Patient has No headache, No chest pain, No abdominal pain - No Nausea, No new weakness tingling or numbness, No Cough - SOB  Objective:    Filed Vitals:   01/01/13 1051  BP: 123/76  Pulse: 107  Temp: 99 F (37.2 C)  TempSrc: Oral  Resp: 16  Height: 5\' 7"  (1.702 m)  Weight: 157 lb (71.215 kg)  SpO2: 100%     ALLERGIES:  No Known Allergies  PAST MEDICAL HISTORY: Past Medical History  Diagnosis Date  . Jaundice   . Hyperbilirubinemia   . Anemia   . Alcoholic liver disease   . Bacteremia   . Cholangitis   . Hyponatremia   . Leucocytosis   . Alcoholic hepatitis     MEDICATIONS AT HOME: Prior to Admission medications   Medication Sig Start Date End Date Taking? Authorizing Provider  Docusate Sodium (DSS) 100 MG CAPS Take 200 mg by mouth 2 (two) times daily. 01/01/13   Ripudeep Jenna Luo, MD  feeding supplement (RESOURCE BREEZE) LIQD Take 1 Container by mouth 3 (three) times daily between meals. 11/08/12   Shanker Levora Dredge, MD  folic acid (FOLVITE) 1 MG tablet Take 1 tablet (1 mg total) by mouth daily. 12/20/12   Shanker Levora Dredge, MD  furosemide (LASIX) 20 MG tablet Take 1 tablet (20 mg total) by mouth daily.  01/01/13   Ripudeep Jenna Luo, MD  hydrocortisone (ANUSOL-HC) 25 MG suppository Place 1 suppository (25 mg total) rectally 2 (two) times daily as needed for hemorrhoids. 01/01/13   Ripudeep Jenna Luo, MD  hydrOXYzine (ATARAX/VISTARIL) 10 MG tablet Take 1 tablet (10 mg total) by mouth 3 (three) times daily as needed for itching. 01/01/13   Ripudeep Jenna Luo, MD  Multiple Vitamin (MULTIVITAMIN WITH MINERALS) TABS tablet Take 1 tablet by mouth daily. 11/08/12   Shanker Levora Dredge, MD  polyethylene glycol (MIRALAX / GLYCOLAX) packet Take 17 g by mouth 2 (two) times daily. 01/01/13   Ripudeep Jenna Luo, MD  potassium chloride SA (K-DUR,KLOR-CON) 20 MEQ tablet Take 2 tablets (40 mEq total) by mouth daily. 01/01/13   Ripudeep Jenna Luo, MD  pramoxine (PROCTOFOAM) 1 % foam Place rectally 3 (three) times daily as needed. 01/01/13   Ripudeep Jenna Luo, MD  sodium bicarbonate 650 MG tablet Take 2 tablets (1,300 mg total) by mouth 4 (four) times daily. 01/01/13   Ripudeep Jenna Luo, MD  thiamine 100 MG tablet Take 1 tablet (100 mg total) by mouth daily. 11/08/12   Shanker Levora Dredge, MD     Exam  General appearance :Awake, alert, NAD, Speech Clear.  HEENT: Atraumatic and Normocephalic, PERLA, acne, scleral icterus has improved Neck: supple, no JVD. No cervical lymphadenopathy.  Chest: Clear to auscultation bilaterally,  no wheezing, rales or rhonchi CVS: S1 S2 regular, no murmurs.  Abdomen: soft, NBS, NT, ND, no gaurding, rigidity or rebound. Extremities: no cyanosis or clubbing, B/L Lower Ext shows 2+ edema bilaterally Neurology: Awake alert, and oriented X 3, CN II-XII intact, Non focal Skin: No Rash or lesions Wounds:N/A    Data Review   Basic Metabolic Panel: No results found for this basename: NA, K, CL, CO2, GLUCOSE, BUN, CREATININE, CALCIUM, MG, PHOS,  in the last 168 hours Liver Function Tests: No results found for this basename: AST, ALT, ALKPHOS, BILITOT, PROT, ALBUMIN,  in the last 168 hours  CBC: No results found  for this basename: WBC, NEUTROABS, HGB, HCT, MCV, PLT,  in the last 168 hours  ------------------------------------------------------------------------------------------------------------------ No results found for this basename: HGBA1C,  in the last 72 hours ------------------------------------------------------------------------------------------------------------------ No results found for this basename: CHOL, HDL, LDLCALC, TRIG, CHOLHDL, LDLDIRECT,  in the last 72 hours ------------------------------------------------------------------------------------------------------------------ No results found for this basename: TSH, T4TOTAL, FREET3, T3FREE, THYROIDAB,  in the last 72 hours ------------------------------------------------------------------------------------------------------------------ No results found for this basename: VITAMINB12, FOLATE, FERRITIN, TIBC, IRON, RETICCTPCT,  in the last 72 hours  Coagulation profile  No results found for this basename: INR, PROTIME,  in the last 168 hours    Assessment & Plan   Active Problems: 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 Check d-dimer although my suspicion for DVT is low. - Check CMET today for potassium and LFTs and renal function  Internal hemorrhoids : - Patient states that he is not bleeding anymore, following high-fiber diet - Continue stool softeners, he has appointment with general surgery on 01/07/2013   Recommendations: follow labs   Follow-up in 4 weeks     RAI,RIPUDEEP M.D. 01/01/2013, 11:27  AM

## 2013-01-02 ENCOUNTER — Other Ambulatory Visit: Payer: Self-pay | Admitting: Internal Medicine

## 2013-01-02 ENCOUNTER — Telehealth: Payer: Self-pay

## 2013-01-02 DIAGNOSIS — R609 Edema, unspecified: Secondary | ICD-10-CM

## 2013-01-02 LAB — D-DIMER, QUANTITATIVE: D-Dimer, Quant: 2.68 ug/mL-FEU — ABNORMAL HIGH (ref 0.00–0.48)

## 2013-01-02 NOTE — Progress Notes (Signed)
Quick Note:  D Dimer elevated, I have ordered Venous Duplex of Lower extremities to rule out DVT , please call patient in AM to get the exam asap ______

## 2013-01-02 NOTE — Telephone Encounter (Signed)
[facility] called with a critical lab  D-Dimer 2.68 Message sent to Dr Isidoro Donning to review his labs

## 2013-01-03 ENCOUNTER — Telehealth: Payer: Self-pay

## 2013-01-03 NOTE — Telephone Encounter (Signed)
Used interpreter [employer] both numbers  Not available Left message on machine to call office for lab results

## 2013-01-03 NOTE — Telephone Encounter (Signed)
Message copied by Lestine Mount on Fri Jan 03, 2013 11:00 AM ------      Message from: RAI, RIPUDEEP K      Created: Thu Jan 02, 2013  8:08 PM       D Dimer elevated, I have ordered Venous Duplex of Lower extremities to rule out DVT , please call patient in AM to get the exam asap ------

## 2013-01-06 ENCOUNTER — Ambulatory Visit (HOSPITAL_COMMUNITY): Admission: RE | Admit: 2013-01-06 | Payer: No Typology Code available for payment source | Source: Ambulatory Visit

## 2013-01-07 ENCOUNTER — Ambulatory Visit (INDEPENDENT_AMBULATORY_CARE_PROVIDER_SITE_OTHER): Payer: Self-pay | Admitting: General Surgery

## 2013-01-07 ENCOUNTER — Encounter (INDEPENDENT_AMBULATORY_CARE_PROVIDER_SITE_OTHER): Payer: Self-pay

## 2013-01-07 ENCOUNTER — Encounter (INDEPENDENT_AMBULATORY_CARE_PROVIDER_SITE_OTHER): Payer: Self-pay | Admitting: General Surgery

## 2013-01-07 VITALS — BP 130/70 | HR 88 | Temp 97.6°F | Resp 18 | Ht 66.0 in | Wt 163.2 lb

## 2013-01-07 DIAGNOSIS — K625 Hemorrhage of anus and rectum: Secondary | ICD-10-CM

## 2013-01-07 NOTE — Patient Instructions (Signed)
Return to the office if your bleeding returns

## 2013-01-07 NOTE — Progress Notes (Signed)
Danny Arnold is a 37 y.o. male who is here for a follow up visit regarding his rectal bleeding.  He has had no other problems since his banding.  He denies any pain and is having regular BM's.    Objective: Filed Vitals:   01/07/13 1138  BP: 130/70  Pulse: 88  Temp: 97.6 F (36.4 C)  Resp: 18    General appearance: alert and cooperative GI: normal findings: soft, non-tender   Assessment and Plan: Doing well post banding.  F/U PRN    .[name], MD [facility], [place] [phone]

## 2013-01-16 ENCOUNTER — Telehealth: Payer: Self-pay | Admitting: Emergency Medicine

## 2013-01-16 NOTE — Telephone Encounter (Signed)
Message copied by Darlis Loan on Thu Jan 16, 2013  1:54 PM ------      Message from: RAI, RIPUDEEP K      Created: Thu Jan 02, 2013  8:02 PM       Please call this patient asap in AM to get Doppler US of lower legs to rule out DVT. I will order the dopplers in the chart. ------

## 2013-01-16 NOTE — Telephone Encounter (Signed)
Pt given U/S appt with clear instructions to hospital via [facility] interpretor line. Pt missed last scheduled appt 01/06/13

## 2013-01-20 ENCOUNTER — Ambulatory Visit (HOSPITAL_COMMUNITY)
Admission: RE | Admit: 2013-01-20 | Discharge: 2013-01-20 | Disposition: A | Discharge status: Home / Self Care | Payer: No Typology Code available for payment source | Source: Ambulatory Visit | Attending: Internal Medicine | Admitting: Internal Medicine

## 2013-01-20 DIAGNOSIS — K766 Portal hypertension: Secondary | ICD-10-CM | POA: Insufficient documentation

## 2013-01-20 DIAGNOSIS — R609 Edema, unspecified: Secondary | ICD-10-CM | POA: Insufficient documentation

## 2013-01-20 DIAGNOSIS — F1020 Alcohol dependence, uncomplicated: Secondary | ICD-10-CM | POA: Insufficient documentation

## 2013-01-20 DIAGNOSIS — K7030 Alcoholic cirrhosis of liver without ascites: Secondary | ICD-10-CM | POA: Insufficient documentation

## 2013-01-20 DIAGNOSIS — M7989 Other specified soft tissue disorders: Secondary | ICD-10-CM

## 2013-01-20 DIAGNOSIS — K7010 Alcoholic hepatitis without ascites: Secondary | ICD-10-CM | POA: Insufficient documentation

## 2013-01-20 NOTE — Progress Notes (Signed)
VASCULAR LAB PRELIMINARY  PRELIMINARY  PRELIMINARY  PRELIMINARY  Bilateral lower extremity venous duplex completed.    Preliminary report:  Bilateral:  No evidence of DVT, superficial thrombosis, or Baker's Cyst. Moderate Interstitial fluid noted throughout the lower leg bilaterally right greater than left. Enlargement of the right inguinal lymph nodes noted.   Cristie Mckinney, RVS 01/20/2013, 12:45 PM

## 2013-02-03 ENCOUNTER — Ambulatory Visit: Payer: No Typology Code available for payment source | Attending: Internal Medicine | Admitting: Internal Medicine

## 2013-02-03 ENCOUNTER — Encounter: Payer: Self-pay | Admitting: Gastroenterology

## 2013-02-03 ENCOUNTER — Encounter: Payer: Self-pay | Admitting: Internal Medicine

## 2013-02-03 VITALS — BP 131/76 | HR 96 | Temp 98.2°F | Resp 16 | Ht 67.0 in | Wt 183.0 lb

## 2013-02-03 DIAGNOSIS — R188 Other ascites: Secondary | ICD-10-CM | POA: Insufficient documentation

## 2013-02-03 DIAGNOSIS — K766 Portal hypertension: Secondary | ICD-10-CM

## 2013-02-03 DIAGNOSIS — R609 Edema, unspecified: Secondary | ICD-10-CM

## 2013-02-03 DIAGNOSIS — K7030 Alcoholic cirrhosis of liver without ascites: Secondary | ICD-10-CM

## 2013-02-03 DIAGNOSIS — R17 Unspecified jaundice: Secondary | ICD-10-CM

## 2013-02-03 HISTORY — DX: Other ascites: R18.8

## 2013-02-03 MED ORDER — ADULT MULTIVITAMIN W/MINERALS CH: 1.0000 | ORAL_TABLET | Freq: Every day | ORAL | Status: DC

## 2013-02-03 MED ORDER — SPIRONOLACTONE 25 MG PO TABS: 25.0000 mg | ORAL_TABLET | Freq: Every day | ORAL | Status: DC

## 2013-02-03 MED ORDER — FUROSEMIDE 20 MG PO TABS: 20.0000 mg | ORAL_TABLET | Freq: Every day | ORAL | Status: DC

## 2013-02-03 NOTE — Progress Notes (Signed)
Pt si here following up on his diagnosis of jaundice. Pt reports having abdomen pain and tightness.  Pt also states that his feet are still swollen.

## 2013-02-03 NOTE — Progress Notes (Signed)
Patient ID: Danny Arnold, male   DOB: 08-08-1975, 37 y.o.   MRN: [id number] Patient Demographics  Danny Arnold, is a 37 y.o. male  QIO:[id number]  LKG:[id number]  DOB - 02-04-1976  Chief Complaint  Patient presents with  . Follow-up        Subjective:   Danny Arnold is a 37 y.o. male here today for a follow up visit. 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 He has bowel movement as usual, no vomiting. He has not seen a gastroenterologist since the diagnosis.  Patient has No headache, No chest pain, No abdominal pain - No Nausea, No new weakness tingling or numbness, No Cough - SOB.  ALLERGIES: No Known Allergies  PAST MEDICAL HISTORY: Past Medical History  Diagnosis Date  . Jaundice   . Hyperbilirubinemia   . Anemia   . Alcoholic liver disease   . Bacteremia   . Cholangitis   . Hyponatremia   . Leucocytosis   . Alcoholic hepatitis     MEDICATIONS AT HOME: Prior to Admission medications   Medication Sig Start Date End Date Taking? Authorizing Provider  Docusate Sodium (DSS) 100 MG CAPS Take 200 mg by mouth 2 (two) times daily. 01/01/13  Yes Ripudeep Jenna Luo, MD  folic acid (FOLVITE) 1 MG tablet Take 1 tablet (1 mg total) by mouth daily. 12/20/12  Yes Shanker Levora Dredge, MD  furosemide (LASIX) 20 MG tablet Take 1 tablet (20 mg total) by mouth daily. 02/03/13  Yes [name], MD  hydrOXYzine (ATARAX/VISTARIL) 10 MG tablet Take 1 tablet (10 mg total) by mouth 3 (three) times daily as needed for itching. 01/01/13  Yes Ripudeep Jenna Luo, MD  Multiple Vitamin (MULTIVITAMIN WITH MINERALS) TABS tablet Take 1 tablet by mouth daily. 02/03/13  Yes [name], MD  potassium chloride SA (K-DUR,KLOR-CON) 20 MEQ tablet Take 2 tablets (40 mEq total) by mouth daily. 01/01/13  Yes  Ripudeep Jenna Luo, MD  feeding supplement (RESOURCE BREEZE) LIQD Take 1 Container by mouth 3 (three) times daily between meals. 11/08/12   Shanker Levora Dredge, MD  hydrocortisone (ANUSOL-HC) 25 MG suppository Place 1 suppository (25 mg total) rectally 2 (two) times daily as needed for hemorrhoids. 01/01/13   Ripudeep Jenna Luo, MD  polyethylene glycol (MIRALAX / GLYCOLAX) packet Take 17 g by mouth 2 (two) times daily. 01/01/13   Ripudeep Jenna Luo, MD  spironolactone (ALDACTONE) 25 MG tablet Take 1 tablet (25 mg total) by mouth daily. 02/03/13   [name], MD  thiamine 100 MG tablet Take 1 tablet (100 mg total) by mouth daily. 11/08/12   Shanker Levora Dredge, MD     Objective:   Filed Vitals:   02/03/13 1200  BP: 131/76  Pulse: 96  Temp: 98.2 F (36.8 C)  TempSrc: Oral  Resp: 16  Height: 5\' 7"  (1.702 m)  Weight: 183 lb (83.008 kg)  SpO2: 100%    Exam General appearance : Awake, alert, not in any distress. Speech Clear. Chronically ill-looking, cachectic, spider angiomata on face and neck HEENT: Atraumatic and Normocephalic, pupils equally reactive to light and accomodation, slight icterus Neck: supple, no JVD. No cervical lymphadenopathy.  Chest:Good air entry bilaterally, no added sounds  CVS: S1 S2 regular, no murmurs.  Abdomen: Massive ascites with telltale signs of end-stage liver disease including spider angiomata and visible anterior abdominal veins. Bowel  sounds present, Non tender Extremities: B/L Lower Ext shows mild edema, both legs are warm to touch Neurology: Awake alert, and oriented X 3, CN II-XII intact, Non focal Skin:No Rash Wounds:N/A   Data Review   CBC No results found for this basename: WBC, HGB, HCT, PLT, MCV, MCH, MCHC, RDW, NEUTRABS, LYMPHSABS, MONOABS, EOSABS, BASOSABS, BANDABS, BANDSABD,  in the last 168 hours  Chemistries   No results found for this basename: NA, K, CL, CO2, GLUCOSE, BUN, CREATININE, GFRCGP, CALCIUM, MG, AST, ALT, ALKPHOS, BILITOT,  in the  last 168 hours ------------------------------------------------------------------------------------------------------------------ No results found for this basename: HGBA1C,  in the last 72 hours ------------------------------------------------------------------------------------------------------------------ No results found for this basename: CHOL, HDL, LDLCALC, TRIG, CHOLHDL, LDLDIRECT,  in the last 72 hours ------------------------------------------------------------------------------------------------------------------ No results found for this basename: TSH, T4TOTAL, FREET3, T3FREE, THYROIDAB,  in the last 72 hours ------------------------------------------------------------------------------------------------------------------ No results found for this basename: VITAMINB12, FOLATE, FERRITIN, TIBC, IRON, RETICCTPCT,  in the last 72 hours  Coagulation profile  No results found for this basename: INR, PROTIME,  in the last 168 hours    Assessment & Plan   1. Peripheral edema - spironolactone (ALDACTONE) 25 MG tablet; Take 1 tablet (25 mg total) by mouth daily.  Dispense: 90 tablet; Refill: 3 - furosemide (LASIX) 20 MG tablet; Take 1 tablet (20 mg total) by mouth daily.  Dispense: 90 tablet; Refill: 3  2. Portal hypertension  - Ambulatory referral to Gastroenterology  3. Cirrhosis, alcoholic  - Ambulatory referral to Gastroenterology - Multiple Vitamin (MULTIVITAMIN WITH MINERALS) TABS tablet; Take 1 tablet by mouth daily.  Dispense: 90 tablet; Refill: 3  4. Jaundice Improved   5. Ascites  - spironolactone (ALDACTONE) 25 MG tablet; Take 1 tablet (25 mg total) by mouth daily.  Dispense: 90 tablet; Refill: 3 - Ambulatory referral to Gastroenterology - furosemide (LASIX) 20 MG tablet; Take 1 tablet (20 mg total) by mouth daily.  Dispense: 90 tablet; Refill: 3   Follow up in 3 months or when necessary   The patient was given clear instructions to go to ER or return to  medical center if symptoms don't improve, worsen or new problems develop. The patient verbalized understanding. The patient was told to call to get lab results if they haven't heard anything in the next week.    [name], MD, MHA, FACP, FAAP [facility] [place], [place] [phone]   02/03/2013, 12:27 PM

## 2013-02-03 NOTE — Patient Instructions (Signed)
Ascitis  (Ascites)  La ascitis es la acumulación de líquido en el abdomen (vientre). Generalmente la causa es una enfermedad del hígado. También puede originarse en otros problemas menos frecuentes. Produce una hinchazón (distensión ) del abdomen.  CAUSAS  La causa más frecuente es la cicatrización hígado (cirrosis) Otras causas son:  · Infección o inflamación en el abdomen.  · Cáncer en el abdomen.  · Insuficiencia cardiaca.  · Ciertas formas de insuficiencia renal (síndrome nefrítico).  · Inflamación del páncreas.  · Coágulos en las venas del hígado.  SÍNTOMAS  Las primeras etapas de ascitis pueden no presentar síntomas. El síntoma principal es la sensación de hinchazón abdominal. Se debe a la presencia de líquido. También puede causar aumento el tamaño del abdomen o de la cintura. Las personas que sufren este problema también pueden observar hinchazón en las piernas y los hombres presentar hinchazón en el escroto. Cuando hay mucho líquido, puede ser difícil respirar. La distensión del abdomen por el líquido puede causar dolor.  DIAGNÓSTICO  Ciertos datos a la historia clínica, como por ejemplo historia de enfermedades hepáticas y de un agrandamiento del abdomen pueden indicar la presencia de ascitis. Generalmente el diagnóstico lo realiza el médico por medio del examen físico. Una ecografía abdominal firmará la presencia de ascitis y estimará la cantidad de líquido.  Una vez que sea confirmado, es importante determinar la causa. Una vez más, la historia de uno de los trastornos enumerados en "CAUSAS" ofrece un firme indicio. El examen físico es importante y será necesario realizar análisis de sangre y tomar radiografías. Durante un procedimiento denominado paracentesis se retira una muestra de líquido del abdomen. Esto puede terminar ciertos factores clave acerca del líquido, como por ejemplo si hay una infección o se trata de cáncer. El médico determinará si es necesaria la paracentesis. Le describirá el  procedimiento.  PREVENCIÓN  La ascitis es una complicación de otras enfermedades. Por lo tanto, para prevenirlo, debe buscar tratamiento para cualquier problema de salud significativo que usted tenga. Una vez que la ascitis está presente, deberá prestarse atención a la ingesta de líquidos y sal para evitar que empeore. Si tiene ascitis, no beba alcohol.  PRONÓSTICO  El pronóstico depende de la enfermedad subyacente. Si la enfermedad rreversible, por ejemplo en el caso de ciertas infecciones, o de insuficiencia cardíaca, la ascitis puede mejorar o desaparecer. Cuando la causa es la cirrosis, la ascitis delata que la enfermedad del hígado ha empeorado y es necesaria una evaluación y tratamiento más profundos. Si la causa es un cáncer, el éxito o el fracaso el tratamiento del cáncer determinará si la ascitis mejorará o no.  RIESGOS Y COMPLICACIONES  Es probable que empeore si no se diagnostica y se trata correctamente. Sí es muy importante puede causar dolor y dificultad para respirar. La principal complicación, además del empeoramiento, en la infección (denominada peritonitis bacteriana espontánea). Esto requiere un tratamiento rápido.  TRATAMIENTO  El tratamiento de la ascitis depende fundamentalmente de la causa. Cuando la causa es una enfermedad del hígado, el tratamiento médico con diuréticos (píldoras para orinar) y la disminución de la ingesta de sal generalmente dan buenos resultados. La ascitis debida a la inflamación (enrojecimiento e irritación) peritoneal o a un tumor maligno (cáncer) no responde al tratamiento de restricción de sal y diuréticos. A veces es necesaria la hospitalización.  Si el tratamiento de la ascitis no puede controlarse con medicamentos, existen otros tratamientos disponibles. El profesional que lo asiste ayudará a determinar qué es lo mejor para usted. Entre   ellos se incluyen:  · Remoción de líquido del abdomen (paracentesis).  · El líquido del abdomen es derivado a una vena (derivación  peritoneo-venosa ).  · Trasplante de hígado.  · Derivación portosistémica intrahepática transyugular con stent.  INSTRUCCIONES PARA EL CUIDADO DOMICILIARIO  Es importante controlar el peso corporal, la ingesta y la eliminación de líquidos. Controle su peso todos los días a la misma hora aproximadamente. Registre estos pesos. Puede ser necesaria la restricción de líquidos. También es importante conocer la cantidad de sodio (sal) que se ingiere. Cuanta más cantidad de sal usted ingiera, más cantidad de líquidos retendrá. El 90% de las personas que sufren ascitis responde a este abordaje.  · Siga cuidadosamente todas las indicaciones para los medicamentos.  · Concurra a las consultas de control con el médico, según le haya indicado.  · Asegúrese de informar cualquier cambio en su salud, especialmente si tiene nuevos síntomas o éstos empeoran.  · Si la causa es una enfermedad hepática debe evitar el consumo de alcohol y otras sustancias tóxicas para el hígado.  SOLICITE ATENCIÓN MÉDICA SI:  · Su peso aumenta más de algunas libras en pocos días.  · Aumenta el tamaño abdominal o de la cintura.  · Comienzan a hincharse las piernas.  · Observa que la hinchazón empeora.  SOLICITE ATENCIÓN MÉDICA DE INMEDIATO SI:  · Le sube la fiebre.  · Siente dolor abdominal.  · Presenta dificultades respiratorias.  · Se siente confuso.  · Tiene una hemorragia por la boca, el estómago o en el recto.  ASEGÚRESE DE QUE:  · Comprende estas instrucciones.  · Controlará su enfermedad.  · Solicitará ayuda inmediatamente si no mejora o si empeora.  Document Released: 01/30/2005 Document Revised: 04/24/2011  [employer]® Patient Information ©2014 [employer].

## 2013-03-04 ENCOUNTER — Other Ambulatory Visit: Payer: Self-pay | Admitting: Internal Medicine

## 2013-03-06 ENCOUNTER — Ambulatory Visit: Payer: Self-pay | Admitting: Gastroenterology

## 2013-03-27 ENCOUNTER — Ambulatory Visit (INDEPENDENT_AMBULATORY_CARE_PROVIDER_SITE_OTHER): Payer: No Typology Code available for payment source | Admitting: Gastroenterology

## 2013-03-27 ENCOUNTER — Other Ambulatory Visit: Payer: Self-pay | Admitting: Gastroenterology

## 2013-03-27 ENCOUNTER — Encounter (INDEPENDENT_AMBULATORY_CARE_PROVIDER_SITE_OTHER): Payer: Self-pay

## 2013-03-27 ENCOUNTER — Encounter: Payer: Self-pay | Admitting: Gastroenterology

## 2013-03-27 VITALS — BP 134/87 | HR 87 | Temp 98.2°F | Wt 159.2 lb

## 2013-03-27 DIAGNOSIS — K922 Gastrointestinal hemorrhage, unspecified: Secondary | ICD-10-CM

## 2013-03-27 DIAGNOSIS — K7030 Alcoholic cirrhosis of liver without ascites: Secondary | ICD-10-CM

## 2013-03-27 DIAGNOSIS — K7460 Unspecified cirrhosis of liver: Secondary | ICD-10-CM

## 2013-03-27 DIAGNOSIS — E872 Acidosis, unspecified: Secondary | ICD-10-CM

## 2013-03-27 DIAGNOSIS — I8510 Secondary esophageal varices without bleeding: Secondary | ICD-10-CM

## 2013-03-27 DIAGNOSIS — R188 Other ascites: Secondary | ICD-10-CM

## 2013-03-27 NOTE — Assessment & Plan Note (Signed)
DUE TO HEMORRHOIDS.  RECHECK CBC TODAY.

## 2013-03-27 NOTE — Progress Notes (Signed)
Subjective:    Patient ID: Danny Arnold, male    DOB: [birth year], 38 y.o.   MRN: [id number]  Angelica Chessman, MD  HPI PT DOES NOT SPEAK ENGLISH. HISTORY OBTAINED VIA LANGUAGE RESOURCE: Danny Arnold.  CIRRHOSIS AND HAS FLUID IN BELLY AND ALSO HIS FEET.  FEET ARE BETTER. STOMACH FEELS LIKE THERE IS FLUID. FLUID PILLS MADE IT BETTER. NO ETOH SINCE OCT 2014. PILLS MAKE HIM FEEL NAUSEOUS. HAS HERNIA AND IT'S UNCOMFORTABLE. ALMOST NO SALT. 6-7 BOTTLES A DAY. LOOSE STOOLS ON DULCOLAX, [place], AND COLACE. PT DENIES FEVER, CHILLS, BRBPR, VOMITING, melena,  constipation, problems swallowing, problems with sedation, OR heartburn or indigestion. NO EXPOSURE TO TB. USU TO WEIGH 70 KG.  Past Medical History  Diagnosis Date  . Jaundice   . Hyperbilirubinemia   . Anemia   . Alcoholic liver disease   . Bacteremia   . Cholangitis   . Hyponatremia   . Leucocytosis   . Alcoholic hepatitis    Past Surgical History  Procedure Laterality Date  . Hand surgery    . Colonoscopy N/A 11/25/2012    IH  . Esophagogastroduodenoscopy N/A 11/25/2012    GRADE 1 VARICES  . Hemorrhoid banding  2014 x1     No Known Allergies  Current Outpatient Prescriptions  Medication Sig Dispense Refill  . Docusate Sodium (DSS) 100 MG CAPS Take 200 mg by mouth 2 (two) times daily.    . folic acid (FOLVITE) 1 MG tablet TAKE 1 TABLET BY MOUTH ONCE DAILY    . furosemide (LASIX) 20 MG tablet Take 1 tablet (20 mg total) by mouth daily.    . hydrOXYzine (ATARAX/VISTARIL) 10 MG tablet Take 1 tablet (10 mg total) by mouth 3 (three) times daily as needed for itching.    . Multiple Vitamin (MULTIVITAMIN WITH MINERALS) TABS tablet Take 1 tablet by mouth daily.    . polyethylene glycol (MIRALAX / GLYCOLAX) packet Take 17 g by mouth 2 (two) times daily.    . potassium chloride SA (K-DUR,KLOR-CON) 20 MEQ tablet Take 2 tablets (40 mEq total) by mouth daily.    . sodium bicarbonate 650 MG tablet Take 650 mg by mouth 4 (four) times  daily.    Marland Kitchen spironolactone (ALDACTONE) 25 MG tablet Take 1 tablet (25 mg total) by mouth daily.    . feeding supplement (RESOURCE BREEZE) LIQD Take 1 Container by mouth 3 (three) times daily between meals.    . hydrocortisone (ANUSOL-HC) 25 MG suppository Place 1 suppository (25 mg total) rectally 2 (two) times daily as needed for hemorrhoids.    . thiamine 100 MG tablet Take 1 tablet (100 mg total) by mouth daily.     Family History  Problem Relation Age of Onset  . Migraines Mother    History  Substance Use Topics  . Smoking status: Never Smoker   .    Marland Kitchen Alcohol Use: Yes     Comment: heavy etoh until 02/2012- since then occasional:STOP 5-6 months ago   NOT MARRIED. 2 BROS AND A SISTER. SOME COUSINS. CONTACT PH#: [phone].  Review of Systems PER HPI OTHERWISE ALL SYSTEMS ARE NEGATIVE.     Objective:   Physical Exam  Vitals reviewed. Constitutional: He is oriented to person, place, and time. He appears well-nourished. No distress.  HENT:  Head: Normocephalic and atraumatic.  Mouth/Throat: Oropharynx is clear and moist. No oropharyngeal exudate.  Eyes: Pupils are equal, round, and reactive to light. No scleral icterus.  Neck: Normal range of motion. Neck supple.  Cardiovascular: Normal rate, regular rhythm and normal heart sounds.   Pulmonary/Chest: Effort normal and breath sounds normal. No respiratory distress.  Abdominal: Soft. Bowel sounds are normal. Distention: moderate. There is no tenderness. There is no rebound and no guarding.  Moderate   Lymphadenopathy:    He has no cervical adenopathy.  Neurological: He is alert and oriented to person, place, and time.  NO FOCAL DEFICITS   Psychiatric: He has a normal mood and affect.          Assessment & Plan:  TIME SPENT REVIEWING RECORD AND OBTAINING H&P, EXPLAINING D/C INSTRUCTIONS: 60 MINS.

## 2013-03-27 NOTE — Assessment & Plan Note (Addendum)
FLUID NOT WELL CONTROLLED.  U/S/ELASTOGRAPHY, PARACENTESIS NEXT WEEK. AVOID ADDING SALT TO YOUR DIET. Stop POTASSIUM. STOP DAILY USE OF MIRALAX, DULCOLAX, AND DOCUSATE. YOU MAY USE AS NEEDED FOR CONSTIPATION. INCREASE ALDACTONE TO FOUR PILLS DAILY. INCREASE LASIX TO 2 PILLS DAILY. ULTRASOUND AND LABS MON FEB 16. HOLD FLUID PILLS ON DAY OF ULTRASOUND.  FOLLOW UP IN 3 MOS.

## 2013-03-27 NOTE — Assessment & Plan Note (Signed)
CONTINUES ON NACO3. WILL D/C IF CO2 NEAR NORMAL.

## 2013-03-27 NOTE — Assessment & Plan Note (Signed)
NO S/SX OF BLEEDING.  EGD IN 2 YEARS

## 2013-03-27 NOTE — Patient Instructions (Addendum)
LIMIT WATER TO 4 BOTTLES A DAY.  AVOID ADDING SALT TO YOUR DIET.  Stop POTASSIUM.  STOP DAILY USE OF MIRALAX, DULCOLAX, AND DOCUSATE. YOU MAY USE AS NEEDED FOR CONSTIPATION.  INCREASE ALDACTONE TO FOUR PILLS DAILY.  INCREASE LASIX TO 2 PILLS DAILY.  COMPLETE ULTRASOUND AND LABS MON FEB 16. HOLD FLUID PILLS ON DAY OF ULTRASOUND.  FOLLOW UP IN 3 MOS.

## 2013-03-31 ENCOUNTER — Encounter (HOSPITAL_COMMUNITY): Payer: Self-pay

## 2013-03-31 ENCOUNTER — Ambulatory Visit (HOSPITAL_COMMUNITY)
Admission: RE | Admit: 2013-03-31 | Discharge: 2013-03-31 | Disposition: A | Discharge status: Home / Self Care | Payer: No Typology Code available for payment source | Source: Ambulatory Visit | Attending: Gastroenterology | Admitting: Gastroenterology

## 2013-03-31 DIAGNOSIS — R932 Abnormal findings on diagnostic imaging of liver and biliary tract: Secondary | ICD-10-CM | POA: Insufficient documentation

## 2013-03-31 DIAGNOSIS — R161 Splenomegaly, not elsewhere classified: Secondary | ICD-10-CM | POA: Insufficient documentation

## 2013-03-31 DIAGNOSIS — K7030 Alcoholic cirrhosis of liver without ascites: Secondary | ICD-10-CM

## 2013-03-31 DIAGNOSIS — R188 Other ascites: Secondary | ICD-10-CM | POA: Insufficient documentation

## 2013-03-31 LAB — BODY FLUID CELL COUNT WITH DIFFERENTIAL
Eos, Fluid: 1 %
LYMPHS FL: 49 %
MONOCYTE-MACROPHAGE-SEROUS FLUID: 31 % — AB (ref 50–90)
NEUTROPHIL FLUID: 19 % (ref 0–25)
WBC FLUID: 1011 uL — AB (ref 0–1000)

## 2013-03-31 LAB — ALBUMIN, FLUID (OTHER): Albumin, Fluid: 2.9 g/dL

## 2013-03-31 NOTE — Progress Notes (Signed)
Paracentesis complete no signs of distress. 4000 ml green/brown colored abdominal fluid removed.

## 2013-03-31 NOTE — Procedures (Signed)
PreOperative Dx: Cirrhosis, ascites Postoperative Dx: Cirrhosis, ascites Procedure:   US guided paracentesis Radiologist:  Thornton Papas Anesthesia:  9 ml of 1% lidocaine Specimen:  4000 ml of yellow ascitic fluid EBL:   None Complications: None

## 2013-03-31 NOTE — Discharge Instructions (Signed)
Ascitis (Ascites) La ascitis es la acumulacin de lquido en el abdomen (vientre). Generalmente la causa es una enfermedad del hgado. Tambin puede originarse en otros problemas menos frecuentes. Produce una hinchazn (distensin ) del abdomen. CAUSAS La causa ms frecuente es la cicatrizacin hgado (cirrosis) Otras causas son:  Infeccin o inflamacin en el abdomen.  Cncer en el abdomen.  Insuficiencia cardiaca.  Ciertas formas de insuficiencia renal (sndrome nefrtico).  Inflamacin del pncreas.  Cogulos en las venas del hgado. SNTOMAS Las primeras etapas de ascitis pueden no presentar sntomas. El sntoma principal es la sensacin de hinchazn abdominal. Se debe a la presencia de lquido. Tambin puede causar aumento el tamao del abdomen o de la cintura. Las personas que sufren este problema tambin pueden observar hinchazn en las piernas y los hombres presentar hinchazn en el escroto. Cuando hay mucho lquido, puede ser difcil respirar. La distensin del abdomen por el lquido puede causar dolor. DIAGNSTICO Ciertos datos a la historia clnica, como por ejemplo historia de enfermedades hepticas y de un agrandamiento del abdomen pueden indicar la presencia de ascitis. Generalmente el diagnstico lo realiza el mdico por medio del examen fsico. Una ecografa abdominal firmar la presencia de ascitis y estimar la cantidad de lquido. Una vez que sea Eurekaconfirmado, es importante determinar la causa. Una vez ms, la historia de uno de los trastornos enumerados en "CAUSAS" ofrece un firme indicio. El examen fsico es importante y ser necesario realizar anlisis de sangre y tomar radiografas. Durante un procedimiento denominado paracentesis se retira Lauris Poaguna muestra de lquido del abdomen. Esto puede terminar ciertos factores clave acerca del lquido, como por ejemplo si hay una infeccin o se trata de cncer. El mdico determinar si es necesaria la paracentesis. Le describir el  procedimiento. PREVENCIN La ascitis es una complicacin de otras enfermedades. Por lo tanto, para prevenirlo, debe buscar tratamiento para cualquier problema de salud significativo que usted tenga. Una vez que la ascitis est presente, deber prestarse atencin a la ingesta de lquidos y sal para evitar que empeore. Si tiene ascitis, no beba alcohol. PRONSTICO El pronstico depende de la enfermedad subyacente. Si la enfermedad rreversible, por ejemplo en el caso de ciertas infecciones, o de insuficiencia cardaca, la ascitis puede mejorar o desaparecer. Cuando la causa es la cirrosis, la ascitis delata que la enfermedad del hgado ha empeorado y es necesaria una evaluacin y tratamiento ms profundos. Si la causa es un cncer, el xito o el fracaso el tratamiento del cncer determinar si la ascitis mejorar o no. RIESGOS Y COMPLICACIONES Es probable que empeore si no se diagnostica y se trata correctamente. S es muy importante puede causar dolor y dificultad para Industrial/product designerrespirar. La principal complicacin, adems del empeoramiento, en la infeccin (denominada peritonitis bacteriana espontnea). Esto requiere un tratamiento rpido. TRATAMIENTO El tratamiento de la ascitis depende fundamentalmente de la causa. Cuando la causa es una enfermedad del hgado, el tratamiento mdico con diurticos (pldoras para Geographical information systems officerorinar) y Engineer, agriculturalla disminucin de la ingesta de sal generalmente dan buenos resultados. La ascitis debida a la inflamacin (enrojecimiento e irritacin) peritoneal o a un tumor maligno (cncer) no responde al tratamiento de restriccin de sal y diurticos. A veces es necesaria la hospitalizacin. Si el tratamiento de la ascitis no puede controlarse con medicamentos, existen otros tratamientos disponibles. El profesional que lo asiste ayudar a Chief Strategy Officerdeterminar qu es lo mejor para usted. Entre ellos se incluyen:  Remocin de lquido del abdomen (paracentesis).  El lquido del abdomen es derivado a una vena (derivacin  peritoneo-venosa ).  Trasplante de hgado.  Derivacin portosistmica intraheptica transyugular con stent. INSTRUCCIONES PARA EL CUIDADO DOMICILIARIO Es importante Facilities manager, la ingesta y la eliminacin de lquidos. Controle su peso [employer] a la misma hora aproximadamente. [employer] Puede ser necesaria la restriccin de lquidos. Tambin es Photographer la cantidad de sodio (sal) que se ingiere. Cuanta ms cantidad de sal usted ingiera, ms cantidad de lquidos retendr. El 90% de las personas que sufren ascitis responde a este abordaje.  Siga cuidadosamente todas las indicaciones para los medicamentos.  Concurra a las consultas de control con el mdico, segn le haya indicado.  Asegrese de informar cualquier cambio en su salud, especialmente si tiene nuevos sntomas o stos empeoran.  Si la causa es una enfermedad heptica debe evitar el consumo de alcohol y otras sustancias txicas para el hgado. SOLICITE ATENCIN MDICA SI:  Su peso aumenta ms de [employer].  Aumenta el tamao abdominal o de la cintura.  Comienzan a hincharse las piernas.  Observa que la hinchazn empeora. SOLICITE ATENCIN MDICA DE INMEDIATO SI:  Le sube la fiebre.  Siente dolor abdominal.  Presenta dificultades respiratorias.  Se siente confuso.  Tiene una hemorragia por la boca, el estmago o en el recto. ASEGRESE DE QUE:  Comprende estas instrucciones.  Controlar su enfermedad.  Solicitar ayuda inmediatamente si no mejora o si empeora. Document Released: 01/30/2005 Document Revised: 04/24/2011 [facility] Patient Information 2014 [place], [place]. Paracentesis (Paracentesis) La paracentesis es un procedimiento que se [place] para retirar el exceso de lquido del vientre (abdomen). El exceso de lquido en el abdomen se denomina ascitis. Puede ser el resultado de ciertos trastornos, como una infeccin, inflamacin, lesiones abdominales,  insuficiencia cardaca, cicatrizacin crnica del hgado (cirrosis) o cncer. El exceso de lquido se retira usando una aguja que se inserta a travs de la piel y los tejidos en el abdomen.  La paracentesis se realiza para:   Determinar la causa del exceso de lquido examinndolo.  Aliviar los sntomas de la falta de aire o dolor que causa el exceso de lquido.  Determinar si hay hemorragia luego de una lesin abdominal. INFORME AL PROFESIONAL SOBRE LOS SIGUIENTES PUNTOS:  Alergias.  Medicamentos que toma, incluyendo hierbas, gotas oftlmicas, medicamentos de venta libre y cremas.  Uso de esteroides (por va oral o cremas).  Problemas anteriores debido a anestsicos o a medicamentos que [employer] sensibilidad.  Posibilidad de embarazo, si correspondiera.  Antecedentes de haber tenido cogulos sanguneos (tromboflebitis).  Antecedentes de hemorragias o problemas sanguneos.  Cirugas previas.  Otros problemas de [place]. RIESGOS Y COMPLICACIONES  Lesiones en un rgano abdominal, como el intestino (intestino grueso), el hgado, el bazo o la vejiga.  Posible infeccin  Hemorragias  Presin arterial baja (hipotensin).   ANTES DEL PROCEDIMIENTO  Este es un procedimiento que puede realizarse de forma ambulatoria. Confirme la hora que debe llegar al procedimiento. Le tomarn Truddie Coco de sangre para determinar el tiempo de coagulacin. La presencia de un trastorno hemorrgico grave (coagulopata que no puede corregirse con rapidez puede hacer desaconsejable este procedimiento. Le indicarn que orine. PROCEDIMIENTO El procedimiento demorar aproximadamente 30 minutos. El tiempo variar segn la cantidad de lquido que se extraiga. Tendr que recostarse sobre la espalda con la Cheri Rous. Luego desinfectarn una zona del abdomen. Le inyectarn un medicamento para adormecer (anestesia local. Elveria Royals se inserta una aguja a travs de la piel y los tejidos hasta ubicarla en el  interior del abdomen. Podr sentir presin o  un leve dolor cuando le inserten la aguja en el abdomen. Con la aguja se extrae el lquido. Comunquele al mdico si se siente mareado. Una vez que se haya extrado el exceso de lquido, se retira la aguja. Se enviar una muestra del lquido para ser examinado.  DESPUS DEL PROCEDIMIENTO  Evaluarn y controlarn su recuperacin. Si no hay problemas, podr volver a su casa poco tiempo despus del procedimiento En los prximos 2 das, podr salir una pequea cantidad de lquido claro por el sitio de insercin de la aguja. Confirme esto con su mdico, as como la cantidad de lquido que Public affairs consultant.  Obtencin de los [place] de las pruebas  Es su responsabilidad retirar el resultado del [place]. No suponga que el resultado es normal si no tiene noticias de su mdico o del establecimiento de salud. Es Building services engineer seguimiento de todos los [place].  INSTRUCCIONES PARA EL CUIDADO DOMICILIARIO  Puede reanudar su dieta y sus actividades normales segn se le haya indicado.  Slo tome medicamentos de Radio broadcast assistant o prescriptos para Glass blower/designer, las molestias, o bajar la fiebre segn las indicaciones de su mdico. SOLICITE ATENCIN MDICA DE INMEDIATO SI:  Siente que le falta el aire o dolor en el pecho.  Presenta dolor, molestias o hinchazn en el abdomen.  Aparece una supuracin o pus en la zona desde donde se ha eliminado el lquido.  Aparece inflamacin o enrojecimiento en la zona desde donde se ha eliminado el lquido.  Presenta una temperatura oral superior a 38,9 C (102 F). Document Released: 05/18/2008 Document Revised: 04/24/2011 [facility] Patient Information 2014 [employer] [employer], [place].

## 2013-04-01 LAB — PATHOLOGIST SMEAR REVIEW

## 2013-04-04 LAB — BODY FLUID CULTURE
Culture: NO GROWTH
GRAM STAIN: NONE SEEN

## 2013-04-05 NOTE — Progress Notes (Addendum)
PLEASE CALL PT. HE NEEDS TO HAVE HIS BLOOD DRAWN MON FEB 23. HIS FLUID ANALYSIS DOES NOT SHOW INFECTION OR CANCER. ONE CULTURE IS STILL PENDING.

## 2013-04-07 LAB — CBC WITH DIFFERENTIAL/PLATELET
Basophils Absolute: 0 10*3/uL (ref 0.0–0.1)
Basophils Relative: 0 % (ref 0–1)
Eosinophils Absolute: 0.3 10*3/uL (ref 0.0–0.7)
Eosinophils Relative: 3 % (ref 0–5)
HEMATOCRIT: 32.5 % — AB (ref 39.0–52.0)
HEMOGLOBIN: 10.3 g/dL — AB (ref 13.0–17.0)
LYMPHS PCT: 29 % (ref 12–46)
Lymphs Abs: 2.7 10*3/uL (ref 0.7–4.0)
MCH: 22.3 pg — ABNORMAL LOW (ref 26.0–34.0)
MCHC: 31.7 g/dL (ref 30.0–36.0)
MCV: 70.5 fL — ABNORMAL LOW (ref 78.0–100.0)
MONO ABS: 1.1 10*3/uL — AB (ref 0.1–1.0)
MONOS PCT: 12 % (ref 3–12)
NEUTROS ABS: 5.3 10*3/uL (ref 1.7–7.7)
NEUTROS PCT: 56 % (ref 43–77)
Platelets: 369 10*3/uL (ref 150–400)
RBC: 4.61 MIL/uL (ref 4.22–5.81)
RDW: 17.7 % — ABNORMAL HIGH (ref 11.5–15.5)
WBC: 9.4 10*3/uL (ref 4.0–10.5)

## 2013-04-07 LAB — PROTIME-INR
INR: 1.33 (ref <1.50)
Prothrombin Time: 16.3 seconds — ABNORMAL HIGH (ref 11.6–15.2)

## 2013-04-07 NOTE — Progress Notes (Signed)
Called and informed pt's friend who speaks [place], [place]. He said he will tell pt to get labs done today.

## 2013-04-08 LAB — COMPLETE METABOLIC PANEL WITH GFR
ALBUMIN: 4.1 g/dL (ref 3.5–5.2)
ALK PHOS: 109 U/L (ref 39–117)
ALT: 14 U/L (ref 0–53)
AST: 28 U/L (ref 0–37)
BUN: 13 mg/dL (ref 6–23)
CALCIUM: 10.1 mg/dL (ref 8.4–10.5)
CO2: 26 meq/L (ref 19–32)
Chloride: 101 mEq/L (ref 96–112)
Creat: 0.8 mg/dL (ref 0.50–1.35)
GLUCOSE: 84 mg/dL (ref 70–99)
POTASSIUM: 4.3 meq/L (ref 3.5–5.3)
Sodium: 133 mEq/L — ABNORMAL LOW (ref 135–145)
Total Bilirubin: 0.8 mg/dL (ref 0.2–1.2)
Total Protein: 8.6 g/dL — ABNORMAL HIGH (ref 6.0–8.3)

## 2013-04-08 LAB — AFP TUMOR MARKER: AFP-Tumor Marker: 1.9 ng/mL (ref 0.0–8.0)

## 2013-04-14 ENCOUNTER — Other Ambulatory Visit: Payer: Self-pay | Admitting: Internal Medicine

## 2013-04-17 ENCOUNTER — Encounter: Payer: Self-pay | Admitting: Gastroenterology

## 2013-04-17 ENCOUNTER — Telehealth: Payer: Self-pay | Admitting: Gastroenterology

## 2013-04-17 DIAGNOSIS — K7030 Alcoholic cirrhosis of liver without ascites: Secondary | ICD-10-CM

## 2013-04-17 NOTE — Telephone Encounter (Addendum)
Called patient TO DISCUSS RESULTS. SPOKE TO Danny Arnold. EXPLAINED PT CAN CUT THE FLUID PILLS IN HALF. HE SHOULD STOP THE SODIUM BICARB TABLETS. CONTINUE TO WATCH THE WATER AND SALT INTAKE. HIS LIVER IS BETTER. HE WILL BE ABLE TO GO BACK TO WORK SOON. OPV E30 MAY 2015 CIRRHOSIS. WILL NEED U/S ELASTOGRAPHY AFTER NEXT VISIT.Marland Kitchen

## 2013-04-17 NOTE — Telephone Encounter (Signed)
ERROR

## 2013-04-22 NOTE — Telephone Encounter (Signed)
Reminder in epic °

## 2013-05-05 ENCOUNTER — Ambulatory Visit: Payer: No Typology Code available for payment source | Attending: Internal Medicine | Admitting: Internal Medicine

## 2013-05-05 VITALS — BP 114/74 | HR 71 | Temp 98.3°F | Resp 16 | Ht 68.0 in | Wt 153.2 lb

## 2013-05-05 DIAGNOSIS — K7030 Alcoholic cirrhosis of liver without ascites: Secondary | ICD-10-CM

## 2013-05-05 NOTE — Progress Notes (Signed)
Follow up on jaundice No other concerns

## 2013-05-05 NOTE — Progress Notes (Signed)
Patient ID: Danny Arnold, male   DOB: 11/01/1975, 38 y.o.   MRN: [id number]   CC:  HPI: Patient has a history of alcoholic cirrhosis, status post paracentesis, followed by Danie Binder, MD. Denies drinking any alcohol. Denies any abdominal distention, GI bleeding, constipation.  Had an ultrasound-guided paracentesis in February which was negative, had 4 L removed Dr. Oneida Alar recommend EGD in 2 years Currently taking Aldactone and Lasix and is compliant  No Known Allergies Past Medical History  Diagnosis Date  . Jaundice   . Hyperbilirubinemia   . Anemia   . Alcoholic liver disease   . Bacteremia   . Cholangitis   . Hyponatremia   . Leucocytosis   . Alcoholic hepatitis    Current Outpatient Prescriptions on File Prior to Visit  Medication Sig Dispense Refill  . feeding supplement (RESOURCE BREEZE) LIQD Take 1 Container by mouth 3 (three) times daily between meals.  90 Container  0  . furosemide (LASIX) 20 MG tablet Take 1 tablet (20 mg total) by mouth daily.  90 tablet  3  . hydrOXYzine (ATARAX/VISTARIL) 10 MG tablet Take 1 tablet (10 mg total) by mouth 3 (three) times daily as needed for itching.  90 tablet  3  . Multiple Vitamin (MULTIVITAMIN WITH MINERALS) TABS tablet Take 1 tablet by mouth daily.  90 tablet  3  . sodium bicarbonate 650 MG tablet TAKE 2 TABLETS BY MOUTH 4 TIMES DAILY.  240 tablet  0  . spironolactone (ALDACTONE) 25 MG tablet Take 1 tablet (25 mg total) by mouth daily.  90 tablet  3   No current facility-administered medications on file prior to visit.   Family History  Problem Relation Age of Onset  . Migraines Mother    History   Social History  . Marital Status: Single    Spouse Name: N/A    Number of Children: N/A  . Years of Education: N/A   Occupational History  . Not on file.   Social History Main Topics  . Smoking status: Never Smoker   . Smokeless tobacco: Not on file  . Alcohol Use: Yes     Comment: heavy etoh until 02/2012- since  then occasional:STOP 5-6 months age  . Drug Use: No  . Sexual Activity: Not on file   Other Topics Concern  . 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    Review of Systems  Constitutional: Negative for fever, chills, diaphoresis, activity change, appetite change and fatigue.  HENT: Negative for ear pain, nosebleeds, congestion, facial swelling, rhinorrhea, neck pain, neck stiffness and ear discharge.   Eyes: Negative for pain, discharge, redness, itching and visual disturbance.  Respiratory: Negative for cough, choking, chest tightness, shortness of breath, wheezing and stridor.   Cardiovascular: Negative for chest pain, palpitations and leg swelling.  Gastrointestinal: Negative for abdominal distention.  Genitourinary: Negative for dysuria, urgency, frequency, hematuria, flank pain, decreased urine volume, difficulty urinating and dyspareunia.  Musculoskeletal: Negative for back pain, joint swelling, arthralgias and gait problem.  Neurological: Negative for dizziness, tremors, seizures, syncope, facial asymmetry, speech difficulty, weakness, light-headedness, numbness and headaches.  Hematological: Negative for adenopathy. Does not bruise/bleed easily.  Psychiatric/Behavioral: Negative for hallucinations, behavioral problems, confusion, dysphoric mood, decreased concentration and agitation.    Objective:   Filed Vitals:   05/05/13 1139  BP: 114/74  Pulse: 71  Temp: 98.3 F (36.8 C)  Resp: 16    Physical  Exam  Constitutional: Appears well-developed and well-nourished. No distress.  HENT: Normocephalic. External right and left ear normal. Oropharynx is clear and moist.  Eyes: Conjunctivae and EOM are normal. PERRLA, no scleral icterus.  Neck: Normal ROM. Neck supple. No JVD. No tracheal deviation. No thyromegaly.  CVS: RRR, S1/S2 +, no murmurs, no gallops, no carotid bruit.  Pulmonary: Effort and breath sounds normal, no  stridor, rhonchi, wheezes, rales.  Abdominal: Soft. BS +,  no distension, tenderness, rebound or guarding.  Musculoskeletal: Normal range of motion. No edema and no tenderness.  Lymphadenopathy: No lymphadenopathy noted, cervical, inguinal. Neuro: Alert. Normal reflexes, muscle tone coordination. No cranial nerve deficit. Skin: Skin is warm and dry. No rash noted. Not diaphoretic. No erythema. No pallor.  Psychiatric: Normal mood and affect. Behavior, judgment, thought content normal.   Lab Results  Component Value Date   WBC 9.4 04/07/2013   HGB 10.3* 04/07/2013   HCT 32.5* 04/07/2013   MCV 70.5* 04/07/2013   PLT 369 04/07/2013   Lab Results  Component Value Date   CREATININE 0.80 04/07/2013   BUN 13 04/07/2013   NA 133* 04/07/2013   K 4.3 04/07/2013   CL 101 04/07/2013   CO2 26 04/07/2013    Lab Results  Component Value Date   HGBA1C 4.9 11/25/2012   Lipid Panel  No results found for this basename: chol, trig, hdl, cholhdl, vldl, ldlcalc       Assessment and plan:   Patient Active Problem List   Diagnosis Date Noted  . Ascites 02/03/2013  . Metabolic acidosis 69/62/9528  . Esophageal varices in alcoholic cirrhosis 41/32/4401  . GI bleed 11/24/2012  . Acute blood loss anemia 11/24/2012  . Abdominal pain 11/24/2012  . Alcoholic hepatitis 02/72/5366  . Cirrhosis, alcoholic 44/04/4740   Alcoholic cirrhosis Continue Lasix Aldactone Followed by gastroenterology Dr. Oneida Alar Followup with GI for EGD in 2 years   Alcohol dependence Patient seems to have quit drinking  Patient will need close outpatient followup every 2-3 months        The patient was given clear instructions to go to ER or return to medical center if symptoms don't improve, worsen or new problems develop. The patient verbalized understanding. The patient was told to call to get any lab results if not heard anything in the next week.

## 2013-05-14 LAB — AFB CULTURE WITH SMEAR (NOT AT ARMC): ACID FAST SMEAR: NONE SEEN

## 2013-06-18 ENCOUNTER — Other Ambulatory Visit: Payer: Self-pay | Admitting: Internal Medicine

## 2013-06-23 ENCOUNTER — Encounter: Payer: Self-pay | Admitting: Gastroenterology

## 2013-07-04 ENCOUNTER — Encounter: Payer: Self-pay | Admitting: Internal Medicine

## 2013-07-04 ENCOUNTER — Ambulatory Visit: Payer: No Typology Code available for payment source | Attending: Internal Medicine | Admitting: Internal Medicine

## 2013-07-04 VITALS — BP 133/78 | HR 74 | Temp 98.2°F | Resp 16 | Ht 67.0 in | Wt 166.0 lb

## 2013-07-04 DIAGNOSIS — K089 Disorder of teeth and supporting structures, unspecified: Secondary | ICD-10-CM | POA: Insufficient documentation

## 2013-07-04 DIAGNOSIS — R17 Unspecified jaundice: Secondary | ICD-10-CM | POA: Insufficient documentation

## 2013-07-04 DIAGNOSIS — K0889 Other specified disorders of teeth and supporting structures: Secondary | ICD-10-CM

## 2013-07-04 DIAGNOSIS — K7030 Alcoholic cirrhosis of liver without ascites: Secondary | ICD-10-CM | POA: Insufficient documentation

## 2013-07-04 DIAGNOSIS — K429 Umbilical hernia without obstruction or gangrene: Secondary | ICD-10-CM | POA: Insufficient documentation

## 2013-07-04 DIAGNOSIS — R188 Other ascites: Secondary | ICD-10-CM | POA: Insufficient documentation

## 2013-07-04 MED ORDER — NAPROXEN 250 MG PO TABS: 250.0000 mg | ORAL_TABLET | Freq: Two times a day (BID) | ORAL | Status: DC | PRN

## 2013-07-04 MED ORDER — FUROSEMIDE 20 MG PO TABS: 20.0000 mg | ORAL_TABLET | Freq: Every day | ORAL | Status: DC

## 2013-07-04 NOTE — Progress Notes (Signed)
Patient ID: Danny Arnold, male   DOB: Aug 04, 1975, 38 y.o.   MRN: [id number] CC: f/u ascites  HPI:  Patient presents today for followup of his ascites.  He is currently being followed by gastroenterologist for alcoholic cirrhosis.  He is taking Lasix and Spironolactone for management of his ascites.  His jaundice has improved significantly.  No Known Allergies Past Medical History  Diagnosis Date  . Jaundice   . Hyperbilirubinemia   . Anemia   . Alcoholic liver disease   . Bacteremia   . Cholangitis   . Hyponatremia   . Leucocytosis   . Alcoholic hepatitis    Current Outpatient Prescriptions on File Prior to Visit  Medication Sig Dispense Refill  . Docusate Sodium (DSS) 100 MG CAPS Take 200 mg by mouth as needed.      . feeding supplement (RESOURCE BREEZE) LIQD Take 1 Container by mouth 3 (three) times daily between meals.  90 Container  0  . furosemide (LASIX) 20 MG tablet Take 1 tablet (20 mg total) by mouth daily.  90 tablet  3  . hydrOXYzine (ATARAX/VISTARIL) 10 MG tablet Take 1 tablet (10 mg total) by mouth 3 (three) times daily as needed for itching.  90 tablet  3  . Multiple Vitamin (MULTIVITAMIN WITH MINERALS) TABS tablet Take 1 tablet by mouth daily.  90 tablet  3  . polyethylene glycol (MIRALAX / GLYCOLAX) packet Take 17 g by mouth daily as needed.      . sodium bicarbonate 650 MG tablet TAKE 2 TABLETS BY MOUTH 4 TIMES DAILY.  240 tablet  0  . spironolactone (ALDACTONE) 25 MG tablet Take 1 tablet (25 mg total) by mouth daily.  90 tablet  3   No current facility-administered medications on file prior to visit.   Family History  Problem Relation Age of Onset  . Migraines Mother    History   Social History  . Marital Status: Single    Spouse Name: N/A    Number of Children: N/A  . Years of Education: N/A   Occupational History  . Not on file.   Social History Main Topics  . Smoking status: Never Smoker   . Smokeless tobacco: Not on file  . Alcohol Use: Yes   Comment: heavy etoh until 02/2012- since then occasional:STOP 5-6 months age  . Drug Use: No  . Sexual Activity: Not on file   Other Topics Concern  . 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   Review of Systems  Constitutional: Negative for fever and malaise/fatigue.  Respiratory: Negative.   Cardiovascular: Negative.   Gastrointestinal: Positive for abdominal pain. Negative for nausea and vomiting.  Genitourinary: Negative.   Musculoskeletal: Negative.   Skin: Positive for itching.      Objective:   Filed Vitals:   07/04/13 1223  BP: 133/78  Pulse: 74  Temp: 98.2 F (36.8 C)  Resp: 16   Physical Exam  Vitals reviewed. Constitutional: He is oriented to person, place, and time.  HENT:  Very poor dentition   Eyes: Scleral icterus (mild) is present.  Neck: Normal range of motion. No JVD present.  Cardiovascular: Normal rate, regular rhythm and normal heart sounds.   Pulmonary/Chest: Effort normal and breath sounds normal.  Abdominal: Soft. Bowel sounds are normal. He exhibits no distension. There is no tenderness. A hernia (umbilical) is present.  Lymphadenopathy:    He has no cervical adenopathy.  Neurological: He is alert and oriented to person, place, and time.  Skin: Skin is warm and dry.  Psychiatric: He has a normal mood and affect.     Lab Results  Component Value Date   WBC 9.4 04/07/2013   HGB 10.3* 04/07/2013   HCT 32.5* 04/07/2013   MCV 70.5* 04/07/2013   PLT 369 04/07/2013   Lab Results  Component Value Date   CREATININE 0.80 04/07/2013   BUN 13 04/07/2013   NA 133* 04/07/2013   K 4.3 04/07/2013   CL 101 04/07/2013   CO2 26 04/07/2013    Lab Results  Component Value Date   HGBA1C 4.9 11/25/2012   Lipid Panel  No results found for this basename: chol, trig, hdl, cholhdl, vldl, ldlcalc       Assessment and plan:   Danny Arnold was seen today for follow-up.  Diagnoses and associated orders for  this visit:  Ascites Continue Lasix and Aldactone - furosemide (LASIX) 20 MG tablet; Take 1 tablet (20 mg total) by mouth daily.  Pain, dental - Ambulatory referral to Dentistry - naproxen (NAPROSYN) 250 MG tablet; Take 1 tablet (250 mg total) by mouth 2 (two) times daily as needed. List of low cost dental clinic given to patient.   Return in about 3 months (around 10/04/2013) for ascities.  Due to language barrier, an interpreter was present during the history-taking and subsequent discussion (and for part of the physical exam) with this patient.   [name], [place] [facility] [phone] 07/04/2013, 12:40 PM

## 2013-07-04 NOTE — Patient Instructions (Signed)
Hernia A hernia occurs when an internal organ pushes out through a weak spot in the abdominal wall. Hernias most commonly occur in the groin and around the navel. Hernias often can be pushed back into place (reduced). Most hernias tend to get worse over time. Some abdominal hernias can get stuck in the opening (irreducible or incarcerated hernia) and cannot be reduced. An irreducible abdominal hernia which is tightly squeezed into the opening is at risk for impaired blood supply (strangulated hernia). A strangulated hernia is a medical emergency. Because of the risk for an irreducible or strangulated hernia, surgery may be recommended to repair a hernia. CAUSES   Heavy lifting.  Prolonged coughing.  Straining to have a bowel movement.  A cut (incision) made during an abdominal surgery. HOME CARE INSTRUCTIONS   Bed rest is not required. You may continue your normal activities.  Avoid lifting more than 10 pounds (4.5 kg) or straining.  Cough gently. If you are a smoker it is best to stop. Even the best hernia repair can break down with the continual strain of coughing. Even if you do not have your hernia repaired, a cough will continue to aggravate the problem.  Do not wear anything tight over your hernia. Do not try to keep it in with an outside bandage or truss. These can damage abdominal contents if they are trapped within the hernia sac.  Eat a normal diet.  Avoid constipation. Straining over long periods of time will increase hernia size and encourage breakdown of repairs. If you cannot do this with diet alone, stool softeners may be used. SEEK IMMEDIATE MEDICAL CARE IF:   You have a fever.  You develop increasing abdominal pain.  You feel nauseous or vomit.  Your hernia is stuck outside the abdomen, looks discolored, feels hard, or is tender.  You have any changes in your bowel habits or in the hernia that are unusual for you.  You have increased pain or swelling around the  hernia.  You cannot push the hernia back in place by applying gentle pressure while lying down. MAKE SURE YOU:   Understand these instructions.  Will watch your condition.  Will get help right away if you are not doing well or get worse. Document Released: 01/30/2005 Document Revised: 04/24/2011 Document Reviewed: 09/19/2007 [employer] Patient Information 2014 [employer].  

## 2013-07-04 NOTE — Progress Notes (Signed)
Pt is here following up on his jaundice. Pt has no C.C. Today. Pt has an interpreter.

## 2013-07-23 ENCOUNTER — Encounter (INDEPENDENT_AMBULATORY_CARE_PROVIDER_SITE_OTHER): Payer: No Typology Code available for payment source | Admitting: Gastroenterology

## 2013-07-23 ENCOUNTER — Encounter: Payer: Self-pay | Admitting: Gastroenterology

## 2013-07-23 ENCOUNTER — Ambulatory Visit (INDEPENDENT_AMBULATORY_CARE_PROVIDER_SITE_OTHER): Payer: Self-pay | Admitting: Gastroenterology

## 2013-07-23 ENCOUNTER — Other Ambulatory Visit: Payer: Self-pay | Admitting: Gastroenterology

## 2013-07-23 VITALS — BP 123/75 | HR 72 | Temp 97.0°F | Ht 66.0 in | Wt 164.4 lb

## 2013-07-23 DIAGNOSIS — K429 Umbilical hernia without obstruction or gangrene: Secondary | ICD-10-CM

## 2013-07-23 DIAGNOSIS — K7010 Alcoholic hepatitis without ascites: Secondary | ICD-10-CM

## 2013-07-23 NOTE — Progress Notes (Signed)
   Subjective:    Patient ID: Danny Arnold, male    DOB: 1975-08-02, 38 y.o.   MRN: [id number]  Angelica Chessman, MD  HISTORY OBTAINED VIA INTERPRETER: ANTHONY B.  HPI Concerned about umbilical hernia. UNAWARE OF RESULTS FROM FEB 2015. STILL TAKING NAHCO3. BMs: NL, 2-3X/DAY. WITH EFFORT HE FEELS TENDER AROUND HERNIA. PT DENIES FEVER, CHILLS, BRBPR, nausea, vomiting, melena, diarrhea, constipation, problems swallowing, OR heartburn or indigestion. LAST ETOH > 10 MOS AGO.  Past Medical History  Diagnosis Date  . Anemia   . Alcoholic liver disease   . Bacteremia   . Alcoholic hepatitis     Past Surgical History  Procedure Laterality Date  . Hand surgery    . Colonoscopy N/A 11/25/2012    [facility]  . Esophagogastroduodenoscopy N/A 11/25/2012    GRADE 1 VARICES  . Hemorrhoid banding  2014 x1   No Known Allergies  Current Outpatient Prescriptions  Medication Sig Dispense Refill  . furosemide (LASIX) 20 MG tablet Take 1 tablet (20 mg total) by mouth daily.  30 tablet  2  . hydrOXYzine (ATARAX/VISTARIL) 10 MG tablet Take 1 tablet (10 mg total) by mouth 3 (three) times daily as needed for itching.  90 tablet  3  . Multiple Vitamin (MULTIVITAMIN WITH MINERALS) TABS tablet Take 1 tablet by mouth daily.  90 tablet  3  . sodium bicarbonate 650 MG tablet TAKE 2 TABLETS BY MOUTH 4 TIMES DAILY.  240 tablet  0  . spironolactone (ALDACTONE) 25 MG tablet Take 4 tablet (25 mg total) by mouth daily.  90 tablet  3           Review of Systems     Objective:   Physical Exam  Vitals reviewed. Constitutional: He is oriented to person, place, and time. He appears well-nourished. No distress.  HENT:  Head: Normocephalic and atraumatic.  Mouth/Throat: Oropharynx is clear and moist. No oropharyngeal exudate.  Eyes: Pupils are equal, round, and reactive to light. No scleral icterus.  Neck: Normal range of motion. Neck supple.  Cardiovascular: Normal rate, regular rhythm and normal heart  sounds.   Pulmonary/Chest: Effort normal and breath sounds normal. No respiratory distress.  Abdominal: Soft. Bowel sounds are normal. He exhibits no distension. There is no tenderness.  REDUCIBLE UMBILICAL HERNIA, NON-TENDER  Musculoskeletal: Normal range of motion. He exhibits no edema.  Lymphadenopathy:    He has no cervical adenopathy.  Neurological: He is alert and oriented to person, place, and time.  SPANISH-SPEAKING ONLY, NO FOCAL DEFICITS   Skin: Skin is warm and dry.  FEW SPIDER ANGIOMATA ON CHEST  Psychiatric: He has a normal mood and affect.          Assessment & Plan:

## 2013-07-23 NOTE — Assessment & Plan Note (Signed)
CLINICALLY IMPROVED. PT AVOIDING ETOH.  D/C ALDACTONE AND LASIX AND NA BICARB CMP. PT/INR. CBC TODAY REFER TO GENERAL SURGERY FOR UMBILICAL HERNIA REPAIR U/S WITH ELASTOGRAPHY OPV IN 6 MOS

## 2013-07-23 NOTE — Progress Notes (Signed)
Reminder in epic °

## 2013-07-23 NOTE — Patient Instructions (Signed)
YOU MAY RETURN TO WORK.  COMPLETE LABS AND ULTRASOUND  STOP USING ALDACTONE, SODIUM BICARBONATE, AND FUROSEMIDE.  SEE SURGERY FOR UMBILICAL HERNIA REPAIR.  FOLLOW UP IN 6 MOS.

## 2013-07-23 NOTE — Progress Notes (Signed)
   Subjective:    Patient ID: Danny Arnold, male    DOB: 1975-04-13, 38 y.o.   MRN: [id number]  HPI   Past Medical History  Diagnosis Date  . Jaundice   . Hyperbilirubinemia   . Anemia   . Alcoholic liver disease   . Bacteremia   . Cholangitis   . Hyponatremia   . Leucocytosis   . Alcoholic hepatitis      Review of Systems     Objective:   Physical Exam        Assessment & Plan:

## 2013-07-24 ENCOUNTER — Telehealth: Payer: Self-pay | Admitting: Gastroenterology

## 2013-07-24 NOTE — Telephone Encounter (Signed)
Message copied by Rodney Langton on Thu Jul 24, 2013  1:58 PM ------      Message from: Barney Drain L      Created: Wed Jul 23, 2013 11:44 AM      Regarding: RE: Juluis Rainier....       OK Chrisotpher Rivero ANN. THX!      ----- Message -----         From: Michelene Gardener Jose Alleyne         Sent: 07/23/2013  11:32 AM           To: Danie Binder, MD      Subject: Juluis Rainier....                                                              ----- Message -----         From: Joya San         Sent: 07/23/2013  11:22 AM           To: Michelene Gardener Leny Morozov            Hey this is Tena from [facility] this patient is an established pt of one of our doctors here and he is in collections from a debt. Can you call pt and have pt to call and discuss bill before we make an appt?       ------

## 2013-07-25 NOTE — Progress Notes (Signed)
Reminder in epic °

## 2013-07-28 ENCOUNTER — Ambulatory Visit (HOSPITAL_COMMUNITY)
Admission: RE | Admit: 2013-07-28 | Discharge: 2013-07-28 | Disposition: A | Discharge status: Home / Self Care | Payer: No Typology Code available for payment source | Source: Ambulatory Visit | Attending: Gastroenterology | Admitting: Gastroenterology

## 2013-07-28 DIAGNOSIS — I8510 Secondary esophageal varices without bleeding: Secondary | ICD-10-CM | POA: Insufficient documentation

## 2013-07-28 DIAGNOSIS — K7010 Alcoholic hepatitis without ascites: Secondary | ICD-10-CM | POA: Insufficient documentation

## 2013-07-28 LAB — CBC WITH DIFFERENTIAL/PLATELET
Basophils Absolute: 0 10*3/uL (ref 0.0–0.1)
Basophils Relative: 0 % (ref 0–1)
Eosinophils Absolute: 0.2 10*3/uL (ref 0.0–0.7)
Eosinophils Relative: 2 % (ref 0–5)
HEMATOCRIT: 39.5 % (ref 39.0–52.0)
HEMOGLOBIN: 14 g/dL (ref 13.0–17.0)
Lymphocytes Relative: 34 % (ref 12–46)
Lymphs Abs: 3.1 10*3/uL (ref 0.7–4.0)
MCH: 29.2 pg (ref 26.0–34.0)
MCHC: 35.4 g/dL (ref 30.0–36.0)
MCV: 82.3 fL (ref 78.0–100.0)
MONO ABS: 0.7 10*3/uL (ref 0.1–1.0)
MONOS PCT: 8 % (ref 3–12)
NEUTROS ABS: 5.1 10*3/uL (ref 1.7–7.7)
Neutrophils Relative %: 56 % (ref 43–77)
Platelets: 224 10*3/uL (ref 150–400)
RBC: 4.8 MIL/uL (ref 4.22–5.81)
RDW: 14.3 % (ref 11.5–15.5)
WBC: 9.1 10*3/uL (ref 4.0–10.5)

## 2013-07-28 LAB — PROTIME-INR
INR: 1.12 (ref <1.50)
Prothrombin Time: 14.3 seconds (ref 11.6–15.2)

## 2013-07-29 NOTE — Progress Notes (Signed)
cc'd to pcp 

## 2013-08-18 NOTE — Progress Notes (Signed)
PLEASE CALL PT. HIS BLOOD AND PLATELET COUNT ARE NOW NORMAL. HIS LIVER U/S SHOWS SCARRING FROM ALCOHOL BUT NOT ENOUGH TO SAY HE HAS CIRRHOSIS. HE SHOULD AVOID ALCOHOL. OPV DEC 2015 E30 ETOH HEPATITIS.

## 2013-08-19 NOTE — Progress Notes (Signed)
Called pt's friend/interpreter, Jose, and gave him the information.

## 2013-08-19 NOTE — Progress Notes (Signed)
Reminder in EPIC 

## 2013-09-03 ENCOUNTER — Ambulatory Visit: Payer: Self-pay | Admitting: Gastroenterology

## 2013-10-06 ENCOUNTER — Encounter: Payer: Self-pay | Admitting: Internal Medicine

## 2013-10-06 ENCOUNTER — Ambulatory Visit: Payer: Self-pay | Attending: Internal Medicine | Admitting: Internal Medicine

## 2013-10-06 VITALS — BP 135/83 | HR 91 | Temp 98.7°F | Resp 16 | Wt 169.6 lb

## 2013-10-06 DIAGNOSIS — K429 Umbilical hernia without obstruction or gangrene: Secondary | ICD-10-CM

## 2013-10-06 DIAGNOSIS — K7030 Alcoholic cirrhosis of liver without ascites: Secondary | ICD-10-CM

## 2013-10-06 DIAGNOSIS — Z79899 Other long term (current) drug therapy: Secondary | ICD-10-CM | POA: Insufficient documentation

## 2013-10-06 DIAGNOSIS — R188 Other ascites: Secondary | ICD-10-CM | POA: Insufficient documentation

## 2013-10-06 DIAGNOSIS — F1020 Alcohol dependence, uncomplicated: Secondary | ICD-10-CM | POA: Insufficient documentation

## 2013-10-06 DIAGNOSIS — Z87891 Personal history of nicotine dependence: Secondary | ICD-10-CM | POA: Insufficient documentation

## 2013-10-06 DIAGNOSIS — K7031 Alcoholic cirrhosis of liver with ascites: Secondary | ICD-10-CM

## 2013-10-06 NOTE — Progress Notes (Signed)
Patient here for hospital follow up Was admitted for cirrhois

## 2013-10-06 NOTE — Progress Notes (Signed)
MRN: [id number] Name: Danny Arnold  Sex: male Age: 38 y.o. DOB: Nov 26, 1975  Allergies: Review of patient's allergies indicates no known allergies.  Chief Complaint  Patient presents with  . Follow-up    HPI: Patient is 38 y.o. male who has to of alcoholic hepatitis/cirrhosis comes today for followup, patient already saw a gastroenterologist and was told to discontinue Aldactone and Lasix, had a blood work done which was reviewed his anemia and electrolytes are improved, patient has umbilical hernia for that patient has already been referred to general surgery. Patient denies drinking alcohol anymore.  Past Medical History  Diagnosis Date  . Anemia   . Alcoholic liver disease   . Bacteremia   . Alcoholic hepatitis     Past Surgical History  Procedure Laterality Date  . Hand surgery    . Colonoscopy N/A 11/25/2012    IH  . Esophagogastroduodenoscopy N/A 11/25/2012    GRADE 1 VARICES  . Hemorrhoid banding  2014 x1      Medication List       This list is accurate as of: 10/06/13 11:18 AM.  Always use your most recent med list.               DSS 100 MG Caps  Take 200 mg by mouth as needed.     feeding supplement (RESOURCE BREEZE) Liqd  Take 1 Container by mouth 3 (three) times daily between meals.     furosemide 20 MG tablet  Commonly known as:  LASIX  Take 1 tablet (20 mg total) by mouth daily.     hydrOXYzine 10 MG tablet  Commonly known as:  ATARAX/VISTARIL  Take 1 tablet (10 mg total) by mouth 3 (three) times daily as needed for itching.     multivitamin with minerals Tabs tablet  Take 1 tablet by mouth daily.     naproxen 250 MG tablet  Commonly known as:  NAPROSYN  Take 1 tablet (250 mg total) by mouth 2 (two) times daily as needed.     polyethylene glycol packet  Commonly known as:  MIRALAX / GLYCOLAX  Take 17 g by mouth daily as needed.     sodium bicarbonate 650 MG tablet  TAKE 2 TABLETS BY MOUTH 4 TIMES DAILY.     spironolactone 25 MG  tablet  Commonly known as:  ALDACTONE  Take 1 tablet (25 mg total) by mouth daily.        No orders of the defined types were placed in this encounter.    Immunization History  Administered Date(s) Administered  . Influenza,inj,Quad PF,36+ Mos 10/29/2012  . Pneumococcal Polysaccharide-23 10/29/2012    Family History  Problem Relation Age of Onset  . Migraines Mother     History  Substance Use Topics  . Smoking status: Never Smoker   . Smokeless tobacco: Not on file     Comment: Quit smoking more than a year ago  . Alcohol Use: Yes     Comment: heavy etoh until 02/2012- since then occasional:STOP 5-6 months age    Review of Systems   As noted in HPI  61 Vitals:   10/06/13 1040  BP: 135/83  Pulse: 91  Temp: 98.7 F (37.1 C)  Resp: 16    Physical Exam  Physical Exam  Constitutional: No distress.  Eyes: EOM are normal. Pupils are equal, round, and reactive to light.  Cardiovascular: Normal rate and regular rhythm.   Pulmonary/Chest: Breath sounds normal. No respiratory distress. He has no wheezes.  He has no rales.  Abdominal: There is no tenderness. There is no rebound.  Umbilical hernia non tender   Musculoskeletal: He exhibits no edema.    CBC    Component Value Date/Time   WBC 9.1 07/28/2013 1028   RBC 4.80 07/28/2013 1028   RBC 2.79* 10/29/2012 1150   HGB 14.0 07/28/2013 1028   HCT 39.5 07/28/2013 1028   PLT 224 07/28/2013 1028   MCV 82.3 07/28/2013 1028   LYMPHSABS 3.1 07/28/2013 1028   MONOABS 0.7 07/28/2013 1028   EOSABS 0.2 07/28/2013 1028   BASOSABS 0.0 07/28/2013 1028    CMP     Component Value Date/Time   NA 133* 04/07/2013 1229   K 4.3 04/07/2013 1229   CL 101 04/07/2013 1229   CO2 26 04/07/2013 1229   GLUCOSE 84 04/07/2013 1229   BUN 13 04/07/2013 1229   CREATININE 0.80 04/07/2013 1229   CREATININE 0.81 12/02/2012 0835   CALCIUM 10.1 04/07/2013 1229   PROT 8.6* 04/07/2013 1229   ALBUMIN 4.1 04/07/2013 1229   AST 28 04/07/2013 1229   ALT 14  04/07/2013 1229   ALKPHOS 109 04/07/2013 1229   BILITOT 0.8 04/07/2013 1229   GFRNONAA >89 04/07/2013 1229   GFRNONAA >90 12/02/2012 0835   GFRAA >89 04/07/2013 1229   GFRAA >90 12/02/2012 0835    No results found for this basename: chol, tri, ldl    No components found with this basename: hga1c    Lab Results  Component Value Date/Time   AST 28 04/07/2013 12:29 PM    Assessment and Plan  Alcoholic cirrhosis of liver with ascites Patient denies drinking alcohol anymore, currently following up with the GI. Not taking any medications.  Umbilical hernia without obstruction and without gangrene Patient has already been referred to general surgery  .  Lorayne Marek, MD

## 2013-11-04 ENCOUNTER — Ambulatory Visit: Payer: Self-pay | Attending: Internal Medicine

## 2013-11-12 ENCOUNTER — Ambulatory Visit (INDEPENDENT_AMBULATORY_CARE_PROVIDER_SITE_OTHER): Payer: Self-pay | Admitting: Gastroenterology

## 2013-11-12 ENCOUNTER — Telehealth: Payer: Self-pay | Admitting: General Practice

## 2013-11-12 ENCOUNTER — Encounter: Payer: Self-pay | Admitting: Gastroenterology

## 2013-11-12 VITALS — BP 131/83 | HR 76 | Temp 97.4°F | Ht 65.0 in | Wt 175.2 lb

## 2013-11-12 DIAGNOSIS — K429 Umbilical hernia without obstruction or gangrene: Secondary | ICD-10-CM

## 2013-11-12 DIAGNOSIS — K7030 Alcoholic cirrhosis of liver without ascites: Secondary | ICD-10-CM

## 2013-11-12 NOTE — Telephone Encounter (Signed)
Per Nelly Rout with [employer] the patient assistance expired in 09/2013 and he has started the process to renew his application, however he is missing two supporting documents(ID, letter of support).  They have contacted the patient in regards to his application status.

## 2013-11-12 NOTE — Assessment & Plan Note (Signed)
WELL COMPENSATED DISEASE. DISCUSSED PLAN VIA INTERPRETER. PT VOICED UNDERSTANDING.  HAV TOTAL IGG, CMP,PT/INR, AFP TODAY. U/S IN 6 MOS AVOID ETOH MAINTAIN WEIGHT < 175 LBS. EXPLAINED RISKS OF CONTINUED WEIGHT GAIN/WORSENING CIRRHOSIS. NOT DRINKING AS IMPORTANT AS MAINTAING A WEIGHT < 175 LBS. FOLLOW UP IN 6 MOS. MERRY CHRISTMAS AND HAPPY NEW YEAR!

## 2013-11-12 NOTE — Progress Notes (Signed)
   Subjective:    Patient ID: Danny Arnold, male    DOB: [birth year], 38 y.o.   MRN: [id number]  Angelica Chessman, MD  INTERPRETER: Elberta Fortis B-PT SPANISH SPEAKING ONLY.  HPI HERE TO FOLLOW UP FOR HIS HERNIA.  FEELING BETTER. BACK TO WORK. BUT WITH RESTRICTIONS. DID NOT SEE [facility]. RECEIVING BILLS CONTACTED OFC BUT NOBODY ANSWERS PHONE. WEIGHT GAIN: 175 LBS. BMs: NL, 2-3X/DAY. NOR DRINKING ETOH ANYMORE. MAY HAVE PAIN IN ABDOMEN IN UPPER ABDOMEN WHEN HE CHANGES POSITION.   PT DENIES FEVER, CHILLS, HEMATOCHEZIA, , nausea, vomiting, melena, diarrhea, CHEST PAIN, SHORTNESS OF BREATH,  CHANGE IN BOWEL IN HABITS, constipation, problems swallowing, OR heartburn or indigestion.  Past Medical History  Diagnosis Date  . Anemia   . Alcoholic liver disease   . Bacteremia   . Alcoholic hepatitis    Past Surgical History  Procedure Laterality Date  . Hand surgery    . Colonoscopy N/A 11/25/2012    IH  . Esophagogastroduodenoscopy N/A 11/25/2012    GRADE 1 VARICES  . Hemorrhoid banding  2014 x1   No Known Allergies  Current Outpatient Prescriptions: NONE      Review of Systems     Objective:   Physical Exam  Vitals reviewed. Constitutional: He is oriented to person, place, and time. He appears well-developed and well-nourished. No distress.  HENT:  Head: Normocephalic and atraumatic.  Mouth/Throat: Oropharynx is clear and moist. No oropharyngeal exudate.  Eyes: Pupils are equal, round, and reactive to light. No scleral icterus.  Neck: Normal range of motion. Neck supple.  Cardiovascular: Normal rate, regular rhythm and normal heart sounds.   Pulmonary/Chest: Effort normal and breath sounds normal. No respiratory distress.  Abdominal: Soft. Bowel sounds are normal. He exhibits no distension. There is no tenderness.  Musculoskeletal: He exhibits no edema.  Lymphadenopathy:    He has no cervical adenopathy.  Neurological: He is alert and oriented to person, place, and time.  NO  FOCAL DEFICITS'  Psychiatric: He has a normal mood and affect.          Assessment & Plan:

## 2013-11-12 NOTE — Progress Notes (Signed)
1100: Pt's [employer] ASSISTANCE HAS LAPSED. WILL AWAIT CONTINUED COVERAGE TO DRAW LABS.

## 2013-11-12 NOTE — Patient Instructions (Signed)
GET LABS TODAY.  REPEAT LABS AND U/S 1 WEEK PRIOR TO YOUR NEXT VISIT.  MAINTAIN A WEIGHT LESS THAN 175 LBS. MAINTAING A WEIGHT < 175 LBS. IS AS IMPORTANT AS  NOT DRINKING ALCOHOL.   FOLLOW UP IN 6 MOS. MERRY CHRISTMAS AND HAPPY NEW YEAR!

## 2013-11-12 NOTE — Assessment & Plan Note (Signed)
NO SIGNS OR SYMPTOMS OF INCARCERATION.  REFER TO [facility] FOR REPAIR USING [employer] CARD. PT MAY CALL [phone] TO RESOLVE BILLING ISSUES WITH [facility].

## 2013-11-12 NOTE — Progress Notes (Signed)
cc'ed to pcp °

## 2013-11-12 NOTE — Progress Notes (Signed)
APP NIC'D

## 2013-11-19 NOTE — Telephone Encounter (Signed)
I spoke with Danny Arnold at [facility] and she stated that he will pay the physician fee upfront and they will schedule place him on the schedule with in the next couple of weeks.  She is going to communicate this to the patient.

## 2013-11-19 NOTE — Telephone Encounter (Signed)
[facility] called and [facility] answered. They had questions about if patient had [place] assistance and he was showing them the [facility] orange card that he has.  I told Erline Levine that we don't accept the orange card and we were trying to find out at his last OV if he was approved for [facility]. The lady from [facility] said that the patient was there and was trying to schedule his procedure that we referred him to and needed clarification of his insurance.  I picked up and told the lady that CM was on the phone with the financial office and for her to continue holding.  After a few moments she hung up and called back saying all she needed to know was do we have a surgeon in [place] that would accept his insurance because they don't accept the orange card and we couldn't tell them at that moment if he had [facility] or not.  She also said that his interpreter had to leave because they were only scheduled for 30 minutes and had gone over their time.  I told her the referral coordinator had left early today and I would have to check with the office manager. Before I could get back to the phone she had hung up again.

## 2013-11-19 NOTE — Telephone Encounter (Signed)
Per Danny Arnold with [facility] the patient is going to complete his financial application this Friday.

## 2014-04-14 ENCOUNTER — Telehealth: Payer: Self-pay | Admitting: Gastroenterology

## 2014-04-14 ENCOUNTER — Encounter: Payer: Self-pay | Admitting: Gastroenterology

## 2014-04-14 NOTE — Telephone Encounter (Signed)
Letter mail for him to call office

## 2014-04-14 NOTE — Telephone Encounter (Signed)
PT ON RECALL FOR ULTRASOUND

## 2014-04-21 ENCOUNTER — Other Ambulatory Visit: Payer: Self-pay

## 2014-04-21 ENCOUNTER — Telehealth: Payer: Self-pay | Admitting: Gastroenterology

## 2014-04-21 DIAGNOSIS — K7030 Alcoholic cirrhosis of liver without ascites: Secondary | ICD-10-CM

## 2014-04-21 NOTE — Telephone Encounter (Signed)
See other phone note

## 2014-04-21 NOTE — Telephone Encounter (Signed)
Please call [phone] mary about his ultrasound.

## 2014-04-21 NOTE — Telephone Encounter (Signed)
Spoke with Stanton Kidney.   Pt is set up for [place] on 04/27/2014 @ 1115 and has and office visit on 05/12/2014 @ 9:30am

## 2014-04-21 NOTE — Telephone Encounter (Signed)
Pt is due for a 6 month follow up and also needs RUQ U/S one week prior to OV.  Please call Stanton Kidney at [phone] (she is interpreter) for him.  I told her once U/S was scheduled then I would make OV.

## 2014-04-27 ENCOUNTER — Ambulatory Visit (HOSPITAL_COMMUNITY)
Admission: RE | Admit: 2014-04-27 | Discharge: 2014-04-27 | Disposition: A | Discharge status: Home / Self Care | Payer: Self-pay | Source: Ambulatory Visit | Attending: Gastroenterology | Admitting: Gastroenterology

## 2014-04-27 ENCOUNTER — Other Ambulatory Visit: Payer: Self-pay | Admitting: Gastroenterology

## 2014-04-27 DIAGNOSIS — K7030 Alcoholic cirrhosis of liver without ascites: Secondary | ICD-10-CM

## 2014-04-27 DIAGNOSIS — R932 Abnormal findings on diagnostic imaging of liver and biliary tract: Secondary | ICD-10-CM | POA: Insufficient documentation

## 2014-05-05 ENCOUNTER — Telehealth: Payer: Self-pay

## 2014-05-05 NOTE — Telephone Encounter (Signed)
I called pt to remind he has labs due prior to [place] on 05/12/2014. He does not speak [place]. Spoke to [place] and he will tell pt to go to the lab and get these done prior to [place].

## 2014-05-12 ENCOUNTER — Encounter: Payer: Self-pay | Admitting: Gastroenterology

## 2014-05-12 ENCOUNTER — Ambulatory Visit (INDEPENDENT_AMBULATORY_CARE_PROVIDER_SITE_OTHER): Payer: Self-pay | Admitting: Gastroenterology

## 2014-05-12 VITALS — BP 132/84 | HR 68 | Temp 97.0°F | Ht 68.0 in | Wt 174.8 lb

## 2014-05-12 DIAGNOSIS — K420 Umbilical hernia with obstruction, without gangrene: Secondary | ICD-10-CM

## 2014-05-12 DIAGNOSIS — K7030 Alcoholic cirrhosis of liver without ascites: Secondary | ICD-10-CM

## 2014-05-12 NOTE — Patient Instructions (Addendum)
1. Keep up the good work with your diet and avoiding alcohol.  2. Keep your weight stable, no more than 175 pounds. 3. Please fill out the [facility] patient assistance forms.  4. Please have your blood work done.

## 2014-05-12 NOTE — Progress Notes (Signed)
Primary Care Physician: Angelica Chessman, MD  Primary Gastroenterologist:  Barney Drain, MD   Chief Complaint  Patient presents with  . Follow-up    HPI: Danny Arnold is a 39 y.o. male here for follow-up. Presents with interpreter today. Last seen September 2015. He has a history of alcoholic cirrhosis, grade 1 esophageal varices on EGD in October 2014. Has not had his labs done. He did have his ultrasound done a couple of weeks ago which shows probable cholelithiasis and findings suggestive of cirrhosis. Previous elastographies with F3/F4 scores.  Never had surgery for umbilical hernia because of lack of medical insurance or coverage. No issues with abdominal swelling or lower extremity swelling. Feels well. Danny Arnold is his boss. He wants [place] to provide all communication through him.   Hernia bothering him. Uncomfortable when bending over. No vomiting. Appetite good. BM normal. No melena, brbpr. No etoh since 2014.   No current outpatient prescriptions on file.   No current facility-administered medications for this visit.    Allergies as of 05/12/2014  . (No Known Allergies)   Past Medical History  Diagnosis Date  . Anemia   . Alcoholic liver disease   . Bacteremia   . Alcoholic hepatitis    Past Surgical History  Procedure Laterality Date  . Hand surgery    . Colonoscopy N/A 11/25/2012    IH  . Esophagogastroduodenoscopy N/A 11/25/2012    GRADE 1 VARICES  . Hemorrhoid banding  2014 x1   Family History  Problem Relation Age of Onset  . Migraines Mother    History   Social History  . Marital Status: Single    Spouse Name: N/A  . Number of Children: N/A  . Years of Education: N/A   Social History Main Topics  . Smoking status: Never Smoker   . Smokeless tobacco: Not on file     Comment: Quit smoking more than a year ago  . Alcohol Use: 0.0 oz/week    0 Standard drinks or equivalent per week     Comment: heavy etoh until 02/2012- none since 2014.  .  Drug Use: No  . Sexual Activity: Not on file   Other Topics Concern  . 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    ROS:  General: Negative for anorexia, weight loss, fever, chills, fatigue, weakness. ENT: Negative for hoarseness, difficulty swallowing , nasal congestion. CV: Negative for chest pain, angina, palpitations, dyspnea on exertion, peripheral edema.  Respiratory: Negative for dyspnea at rest, dyspnea on exertion, cough, sputum, wheezing.  GI: See history of present illness. GU:  Negative for dysuria, hematuria, urinary incontinence, urinary frequency, nocturnal urination.  Endo: Negative for unusual weight change.    Physical Examination:   BP 132/84 mmHg  Pulse 68  Temp(Src) 97 F (36.1 C)  Ht 5\' 8"  (1.727 m)  Wt 174 lb 12.8 oz (79.289 kg)  BMI 26.58 kg/m2  General: Well-nourished, well-developed in no acute distress.  Eyes: No icterus. Mouth: Oropharyngeal mucosa moist and pink , no lesions erythema or exudate. Lungs: Clear to auscultation bilaterally.  Heart: Regular rate and rhythm, no murmurs rubs or gallops.  Abdomen: Bowel sounds are normal, nontender, nondistended, no hepatosplenomegaly or masses, no abdominal bruits or hernia , no rebound or guarding.   Extremities: No lower extremity edema. No clubbing or deformities. Neuro: Alert and oriented x 4   Skin: Warm and dry, no jaundice.  Psych: Alert and cooperative, normal mood and affect.   Imaging Studies: US Abdomen Limited Ruq  05/06/2014   CLINICAL DATA:  Patient with history of alcoholic cirrhosis.  EXAM: US ABDOMEN LIMITED - RIGHT UPPER QUADRANT  COMPARISON:  Abdominal ultrasound 03/31/2013  FINDINGS: Gallbladder:  Probable small gallstone within the gallbladder lumen. No gallbladder wall thickening or pericholecystic fluid. Negative sonographic Murphy sign.  Common bile duct:  Diameter: 3 mm  Liver:  Liver is nodular in contour and heterogeneous in  echogenicity. No definite focal lesion identified.  IMPRESSION: Heterogeneous hepatic parenchymal echogenicity, raising the possibility of hepatic cirrhosis. No definite focal hepatic lesion identified.  Probable cholelithiasis.   Electronically Signed   By: Lovey Newcomer M.D.   On: 05/06/14 12:15

## 2014-05-13 ENCOUNTER — Ambulatory Visit: Payer: Self-pay | Attending: Internal Medicine

## 2014-05-13 LAB — FERRITIN: FERRITIN: 54 ng/mL (ref 22–322)

## 2014-05-14 ENCOUNTER — Encounter: Payer: Self-pay | Admitting: Gastroenterology

## 2014-05-14 LAB — CBC WITH DIFFERENTIAL/PLATELET
Basophils Absolute: 0 10*3/uL (ref 0.0–0.1)
Basophils Relative: 0 % (ref 0–1)
Eosinophils Absolute: 0.2 10*3/uL (ref 0.0–0.7)
Eosinophils Relative: 2 % (ref 0–5)
HCT: 48.6 % (ref 39.0–52.0)
Hemoglobin: 17 g/dL (ref 13.0–17.0)
LYMPHS ABS: 3.4 10*3/uL (ref 0.7–4.0)
LYMPHS PCT: 34 % (ref 12–46)
MCH: 30.5 pg (ref 26.0–34.0)
MCHC: 35 g/dL (ref 30.0–36.0)
MCV: 87.1 fL (ref 78.0–100.0)
MPV: 9.8 fL (ref 8.6–12.4)
Monocytes Absolute: 1 10*3/uL (ref 0.1–1.0)
Monocytes Relative: 10 % (ref 3–12)
Neutro Abs: 5.3 10*3/uL (ref 1.7–7.7)
Neutrophils Relative %: 54 % (ref 43–77)
PLATELETS: 243 10*3/uL (ref 150–400)
RBC: 5.58 MIL/uL (ref 4.22–5.81)
RDW: 13.5 % (ref 11.5–15.5)
WBC: 9.9 10*3/uL (ref 4.0–10.5)

## 2014-05-14 NOTE — Assessment & Plan Note (Signed)
No signs or symptoms of incarceration. Does have frequent discomfort which is positional. Previously referred to [facility] surgical to have repaired however patient did not have patient assistance at that time so surgery was deferred. Currently working on obtaining patient assistance and would like to be referred again when he has coverage.

## 2014-05-14 NOTE — Progress Notes (Signed)
Quick Note:  CBC and ferritin are normal.  He was supposed to have also had Hep A total Ab, Hep B surface Antibody, AFP tumor marker, PT/INR, LFTs.  Please add on (looks like lab didn't do these). ______

## 2014-05-14 NOTE — Assessment & Plan Note (Signed)
Well compensated disease. No alcohol since 2014. Abdominal ultrasound with no evidence of hepatoma. He is due for blood work today. Advised to maintain weight loss and her 75 pounds, continue to avoid all alcohol. Last EGD 11/2012, grade 1 varices. Still waiting for labs to determine Hep A and B immune status and hepatic function. Further recommendations to follow.

## 2014-05-15 NOTE — Progress Notes (Signed)
CC'ED TO PCP 

## 2014-05-15 NOTE — Progress Notes (Signed)
Quick Note:  I called lab. These could not be added ( Those orders were placed by Dr. Oneida Alar in 10/2013 except the LFT's. Also on her orders were CBC and CMP.  When I printed the labs they all print together. Please advise! ______

## 2014-05-19 ENCOUNTER — Other Ambulatory Visit: Payer: Self-pay

## 2014-05-19 DIAGNOSIS — K7030 Alcoholic cirrhosis of liver without ascites: Secondary | ICD-10-CM

## 2014-05-19 NOTE — Progress Notes (Signed)
Quick Note:  As discussed today with Doris. ______

## 2014-05-19 NOTE — Addendum Note (Signed)
Addended by: Everardo All on: 05/19/2014 02:30 PM   Modules accepted: Orders

## 2014-05-19 NOTE — Progress Notes (Signed)
Quick Note:  I called the lab and told Maudie Mercury I have just faxed the orders from 10/2013 to them again. I have called pt and spoke to [facility] he will get the pt to the lab for the blood work. ______

## 2014-05-20 NOTE — Progress Notes (Signed)
REVIEWED-NO ADDITIONAL RECOMMENDATIONS. 

## 2014-07-02 NOTE — Progress Notes (Signed)
Quick Note:  Letter mailed to pt with the lab orders. ______

## 2014-07-02 NOTE — Progress Notes (Signed)
Quick Note:  Looks like patient never had his labs done.  Can we reach out to him once more? ______

## 2014-07-06 ENCOUNTER — Telehealth: Payer: Self-pay | Admitting: General Practice

## 2014-07-06 NOTE — Telephone Encounter (Signed)
Patient's friend Verdis Frederickson called in stating the patient received a letter concerning the repeat labs we need him to have.  I instructed Verdis Frederickson to call [facility] and have them schedule him an appt to have his labs drawn, because he will not have to pay anything there.  She stated she will have her daughter call and make an appt.

## 2014-07-08 ENCOUNTER — Ambulatory Visit: Payer: Self-pay | Attending: Internal Medicine

## 2014-07-09 LAB — COMPREHENSIVE METABOLIC PANEL
ALBUMIN: 4.4 g/dL (ref 3.5–5.2)
ALK PHOS: 114 U/L (ref 39–117)
ALT: 42 U/L (ref 0–53)
AST: 32 U/L (ref 0–37)
BUN: 13 mg/dL (ref 6–23)
CHLORIDE: 104 meq/L (ref 96–112)
CO2: 25 mEq/L (ref 19–32)
Calcium: 10 mg/dL (ref 8.4–10.5)
Creat: 0.91 mg/dL (ref 0.50–1.35)
GLUCOSE: 88 mg/dL (ref 70–99)
POTASSIUM: 5.3 meq/L (ref 3.5–5.3)
Sodium: 139 mEq/L (ref 135–145)
Total Bilirubin: 0.8 mg/dL (ref 0.2–1.2)
Total Protein: 8 g/dL (ref 6.0–8.3)

## 2014-07-09 LAB — AFP TUMOR MARKER: AFP-Tumor Marker: 2.2 ng/mL (ref <6.1)

## 2014-07-09 LAB — PROTIME-INR
INR: 1.11 (ref <1.50)
Prothrombin Time: 14.3 seconds (ref 11.6–15.2)

## 2014-07-09 LAB — HEPATITIS B SURFACE ANTIBODY,QUALITATIVE: Hep B S Ab: NEGATIVE

## 2014-07-09 LAB — HEPATITIS A ANTIBODY, TOTAL: HEP A TOTAL AB: REACTIVE — AB

## 2014-07-13 NOTE — Progress Notes (Signed)
Quick Note:  Please let patient know his liver numbers are normal. He is immune to Hepatitis A. He needs Hepatitis B vaccinations (series of 3 shots) to PROTECT his liver since he has cirrhosis. Please make arrangements.  Needs f/u OV with [facility] in 10/2014.  ______

## 2014-07-14 NOTE — Progress Notes (Signed)
Quick Note:  Pt is aware and the order for the Hep B series has been mailed to pt. ______

## 2014-08-03 ENCOUNTER — Ambulatory Visit: Payer: Self-pay | Attending: Internal Medicine

## 2014-08-03 VITALS — BP 124/83 | HR 68 | Temp 98.0°F | Resp 18 | Ht 67.5 in | Wt 173.6 lb

## 2014-08-03 DIAGNOSIS — K7010 Alcoholic hepatitis without ascites: Secondary | ICD-10-CM

## 2014-08-03 NOTE — Progress Notes (Signed)
Patient here for Hep B series. Patient rescheduled with provider appointment because patient needs routine chek up prior to receiving immunization. Patient has not been to PCP since 09/2014.

## 2014-08-12 ENCOUNTER — Ambulatory Visit: Payer: Self-pay | Attending: Internal Medicine | Admitting: Internal Medicine

## 2014-08-12 ENCOUNTER — Encounter: Payer: Self-pay | Admitting: Internal Medicine

## 2014-08-12 VITALS — BP 124/82 | HR 94 | Temp 98.4°F | Wt 175.4 lb

## 2014-08-12 DIAGNOSIS — Z23 Encounter for immunization: Secondary | ICD-10-CM

## 2014-08-12 DIAGNOSIS — Z0000 Encounter for general adult medical examination without abnormal findings: Secondary | ICD-10-CM

## 2014-08-12 NOTE — Progress Notes (Signed)
Patient ID: Danny Arnold, male   DOB: [birth year], 39 y.o.   MRN: [id number]  CC: Hep B injection   HPI: Danny Arnold is a 39 y.o. male here today for a follow up visit.  Patient has past medical history of alcoholic liver disease and anemia.  Patient is being seen by [facility]. He was told that he needed to come here to start a 3 dose Hep B series. He is not on any current medications. He has no concerns today.  Patient has No headache, No chest pain, No abdominal pain - No Nausea, No new weakness tingling or numbness, No Cough - SOB.  No Known Allergies Past Medical History  Diagnosis Date  . Anemia   . Alcoholic liver disease   . Bacteremia   . Alcoholic hepatitis    No current outpatient prescriptions on file prior to visit.   No current facility-administered medications on file prior to visit.   Family History  Problem Relation Age of Onset  . Migraines Mother    History   Social History  . Marital Status: Single    Spouse Name: N/A  . Number of Children: N/A  . Years of Education: N/A   Occupational History  . Not on file.   Social History Main Topics  . Smoking status: Never Smoker   . Smokeless tobacco: Not on file     Comment: Quit smoking more than a year ago  . Alcohol Use: 0.0 oz/week    0 Standard drinks or equivalent per week     Comment: heavy etoh until 02/2012- none since 2014.  . Drug Use: No  . Sexual Activity: Not on file   Other Topics Concern  . 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    Review of Systems: See HPI    Objective:   Filed Vitals:   08/12/14 1454  BP: 124/82  Pulse: 94  Temp: 98.4 F (36.9 C)    Physical Exam  Cardiovascular: Normal rate, regular rhythm and normal heart sounds.   Pulmonary/Chest: Effort normal and breath sounds normal.  Abdominal: He exhibits no distension. There is no tenderness.     Lab Results  Component Value Date   WBC 9.9  05/12/2014   HGB 17.0 05/12/2014   HCT 48.6 05/12/2014   MCV 87.1 05/12/2014   PLT 243 05/12/2014   Lab Results  Component Value Date   CREATININE 0.91 07/08/2014   BUN 13 07/08/2014   NA 139 07/08/2014   K 5.3 07/08/2014   CL 104 07/08/2014   CO2 25 07/08/2014    Lab Results  Component Value Date   HGBA1C 4.9 11/25/2012   Lipid Panel  No results found for: CHOL, TRIG, HDL, CHOLHDL, VLDL, LDLCALC     Assessment and plan:   Danny was seen today for discuss hep b.  Diagnoses and all orders for this visit:  Routine health maintenance Orders: -     Hepatitis B vaccine adult IM Patient will come back in 1 month from today for second injection. His third injection will be 6 months from today. Patient is aware of return dates and the importance of completing all 3 injections in 6 months.      Due to language barrier, an interpreter was present during the history-taking and subsequent discussion (and for part of the physical exam) with this patient.  RN visit for Hep injection #2 in 1  month  Chari Manning, [facility] [phone] 08/12/2014, 3:01 PM

## 2014-09-09 ENCOUNTER — Ambulatory Visit: Payer: Self-pay | Attending: Internal Medicine | Admitting: *Deleted

## 2014-09-09 DIAGNOSIS — Z23 Encounter for immunization: Secondary | ICD-10-CM | POA: Insufficient documentation

## 2014-09-09 NOTE — Progress Notes (Signed)
Spoke with patient via IT sales professional, [place] Patient presents for Hep B #2 injection States feeling well Last injection received 08/12/14 Next injection due 02/09/2015  Patient filled out PHQ-9: score of 9 GAD-7 score of 1  Patient offered counseling with LCSW States not at this time. States sx mostly due to hernia. Patient wearing brace and avoiding lifting heavy objects Patient given LCSW business card in case he decides to seek counseling in future.  Note routed to LCSW

## 2014-09-15 ENCOUNTER — Encounter: Payer: Self-pay | Admitting: Gastroenterology

## 2014-11-05 ENCOUNTER — Encounter: Payer: Self-pay | Admitting: Gastroenterology

## 2014-11-05 ENCOUNTER — Other Ambulatory Visit: Payer: Self-pay | Admitting: Gastroenterology

## 2014-11-05 ENCOUNTER — Ambulatory Visit (INDEPENDENT_AMBULATORY_CARE_PROVIDER_SITE_OTHER): Payer: Self-pay | Admitting: Gastroenterology

## 2014-11-05 VITALS — BP 127/81 | HR 70 | Temp 98.1°F | Ht 68.0 in | Wt 176.4 lb

## 2014-11-05 DIAGNOSIS — K429 Umbilical hernia without obstruction or gangrene: Secondary | ICD-10-CM

## 2014-11-05 DIAGNOSIS — I8510 Secondary esophageal varices without bleeding: Secondary | ICD-10-CM

## 2014-11-05 DIAGNOSIS — K7030 Alcoholic cirrhosis of liver without ascites: Secondary | ICD-10-CM

## 2014-11-05 NOTE — Patient Instructions (Signed)
I WILL SEE YOU IN SIX MOS.  ONE WEEK BEFORE YOUR APPOINTMENT YOU NEED SLABS AND ULTRASOUND. YOU CAN HAVE THEM DONE IN [place].  GO TO [facility] AND YOU CAN PAY THE BALANCE ON  YOUR BILL.

## 2014-11-05 NOTE — Progress Notes (Signed)
CC'D TO PCP °

## 2014-11-05 NOTE — Progress Notes (Signed)
ON RECALL  °

## 2014-11-05 NOTE — Assessment & Plan Note (Addendum)
WELL COMPENSATED DISEASE.  FOLLOW UP IN 6 MOS.  LABS/ U/S ONE WEEK PRIOR TO NEXT VISIT IN 6 MOSPT/INR/AFP/CBC/CMP  GREATER THAN 50% WAS SPENT IN COUNSELING & COORDINATION OF CARE WITH THE PATIENT: DISCUSSED PROCEDURE, BENEFITS, AND MANAGEMENT OF UMBILICAL HERNIA/CIRRHOSIS. TOTAL ENCOUNTER TIME: 25 MINS.

## 2014-11-05 NOTE — Progress Notes (Signed)
   Subjective:    Patient ID: Danny Arnold, male    DOB: 06-Nov-1975, 39 y.o.   MRN: [id number]  [name], NP  HPI Back to work BUT NOT FULL TIME DUE TO HERNIA. FEELS GOOD. BMs: NL. MAY HAVE MILD PAIN OFF AND ON. NOTICES IT WHEN HE MOVES.  PT DENIES FEVER, CHILLS, HEMATOCHEZIA, HEMATEMESIS, nausea, vomiting, melena, diarrhea, CHEST PAIN, SHORTNESS OF BREATH,  CHANGE IN BOWEL IN HABITS, constipation, problems swallowing, OR heartburn or indigestion.  Past Medical History  Diagnosis Date  . Anemia   . Alcoholic liver disease   . Bacteremia   . Alcoholic hepatitis     Past Surgical History  Procedure Laterality Date  . Hand surgery    . Colonoscopy N/A 11/25/2012    [facility]  . Esophagogastroduodenoscopy N/A 11/25/2012    GRADE 1 VARICES  . Hemorrhoid banding  2014 x1   No Known Allergies   No current outpatient prescriptions on file.   Review of Systems PER HPI OTHERWISE ALL SYSTEMS ARE NEGATIVE.     Objective:   Physical Exam  Constitutional: He is oriented to person, place, and time. He appears well-developed and well-nourished. No distress.  HENT:  Head: Normocephalic and atraumatic.  Mouth/Throat: Oropharynx is clear and moist. No oropharyngeal exudate.  Eyes: Pupils are equal, round, and reactive to light. No scleral icterus.  Neck: Normal range of motion. Neck supple.  Cardiovascular: Normal rate, regular rhythm and normal heart sounds.   Pulmonary/Chest: Effort normal and breath sounds normal. No respiratory distress.  Abdominal: Soft. Bowel sounds are normal. He exhibits no distension. There is no tenderness.  PROMINENT BULGE AT UMBILICUS ~2 IN BY 4 IN, REDUCIBLE, NONTENDER  Musculoskeletal: He exhibits no edema.  Lymphadenopathy:    He has no cervical adenopathy.  Neurological: He is alert and oriented to person, place, and time.  NO FOCAL DEFICITS   Psychiatric: He has a normal mood and affect.  Vitals reviewed.         Assessment & Plan:

## 2014-11-05 NOTE — Assessment & Plan Note (Signed)
NO SIGNS OR SYMPTOMS OF INCARCERATION.  SPOKE TO STEPHANIE AT [facility]. PT MAY STOP BY AND PAY BILL AT [facility]. EXPLAINED TO PT VIA INTERPRETER: MARITZA & ADDRESS/INSTRUCTION S GIVEN.

## 2014-11-05 NOTE — Assessment & Plan Note (Signed)
NO BRBPR OR MELENA.  NEXT EGD 2017.

## 2015-02-10 ENCOUNTER — Ambulatory Visit: Payer: Self-pay | Attending: Internal Medicine

## 2015-02-10 VITALS — BP 144/86 | HR 89 | Temp 98.4°F | Resp 18 | Ht 68.0 in | Wt 185.0 lb

## 2015-02-10 DIAGNOSIS — Z23 Encounter for immunization: Secondary | ICD-10-CM | POA: Insufficient documentation

## 2015-02-10 DIAGNOSIS — Z0000 Encounter for general adult medical examination without abnormal findings: Secondary | ICD-10-CM

## 2015-02-10 NOTE — Progress Notes (Signed)
Patient here for 3rd hepatitis B injection. Patient reports feeling well today.  Patient denies any thoughts of harming self.

## 2015-02-16 ENCOUNTER — Ambulatory Visit: Payer: Self-pay | Attending: Internal Medicine

## 2015-03-08 ENCOUNTER — Telehealth: Payer: Self-pay | Admitting: Gastroenterology

## 2015-03-08 NOTE — Telephone Encounter (Signed)
Mailed letter °

## 2015-03-08 NOTE — Telephone Encounter (Signed)
PATIENT ON FEB RECALL FOR ULTRASOUND

## 2015-03-29 ENCOUNTER — Other Ambulatory Visit: Payer: Self-pay

## 2015-03-29 DIAGNOSIS — K7030 Alcoholic cirrhosis of liver without ascites: Secondary | ICD-10-CM

## 2015-03-30 ENCOUNTER — Encounter: Payer: Self-pay | Admitting: Gastroenterology

## 2015-04-08 IMAGING — CT CT ABD-PELV W/ CM
2 of 4 series · 15 of 46 positions shown, 17 images · IV contrast (APPLIED)
Comparison: None.

CLINICAL DATA: Jaundice

EXAM:
CT ABDOMEN AND PELVIS WITH CONTRAST
TECHNIQUE: Multidetector CT imaging of the abdomen and pelvis was performed
using the standard protocol following bolus administration of
intravenous contrast.
CONTRAST:  100mL OMNIPAQUE IOHEXOL 300 MG/ML  SOLN

[Series 2: abd/ pelvis 5.0 i30f 1 · axial · 0.80mm/px · z∈[+65,+565]mm · 12 of 110 slices shown, 14 images]
[im 5/110  soft-tissue]
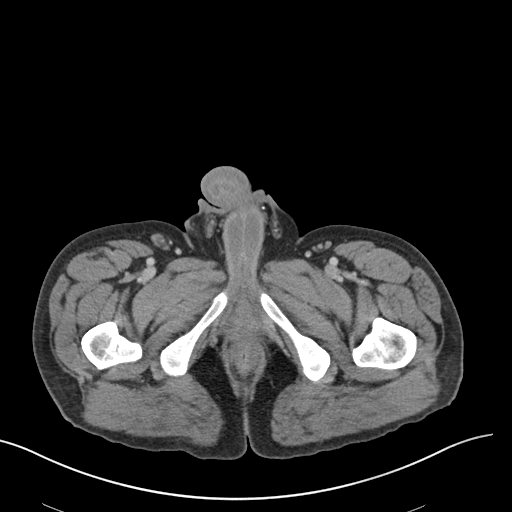
[im 5/110  bone]
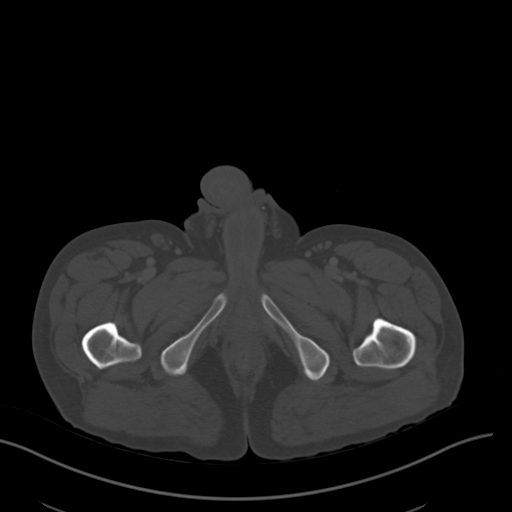
[im 14/110  soft-tissue]
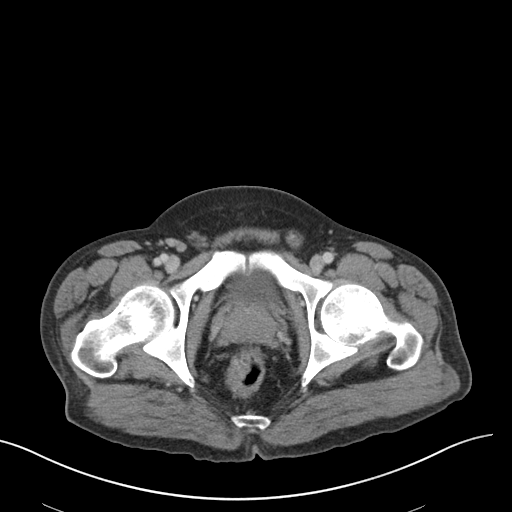
[im 23/110  soft-tissue]
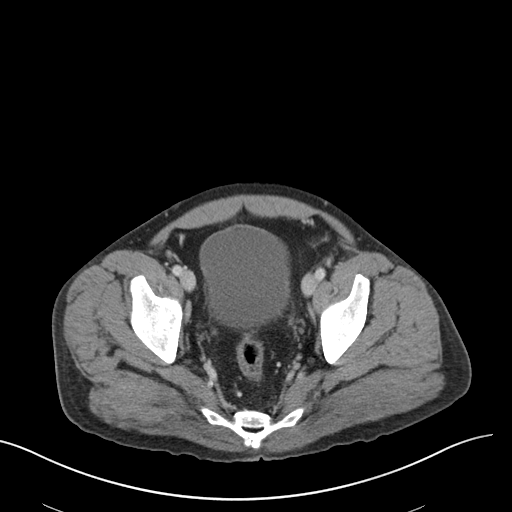
[im 32/110  soft-tissue]
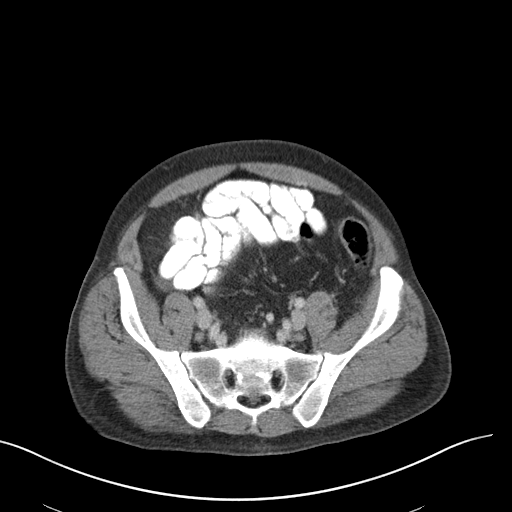
[im 41/110  soft-tissue]
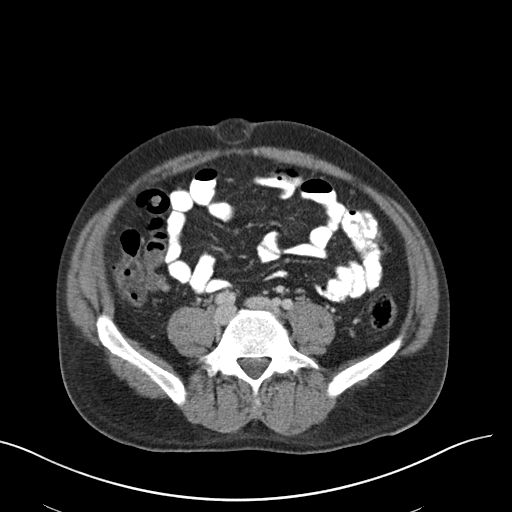
[im 50/110  soft-tissue]
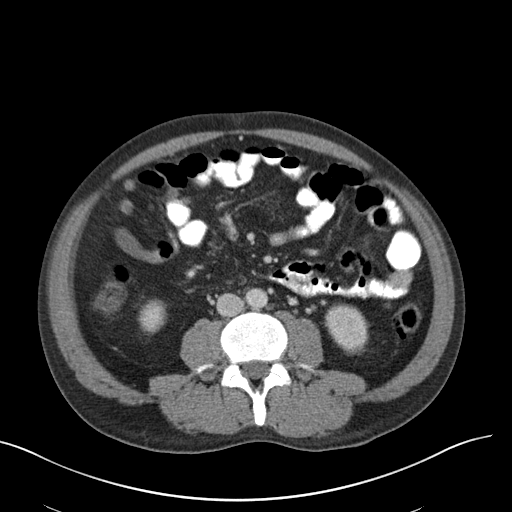
[im 60/110  soft-tissue]
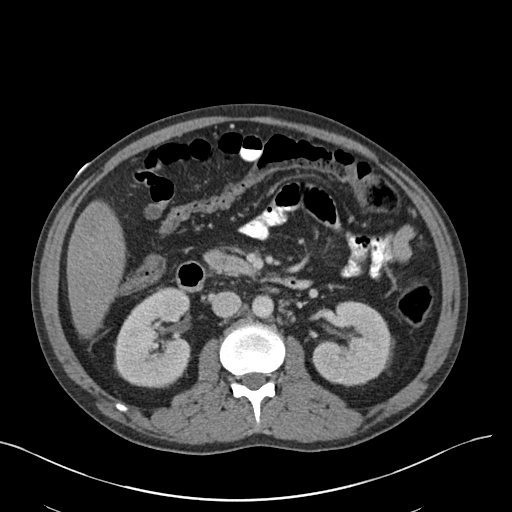
[im 69/110  soft-tissue]
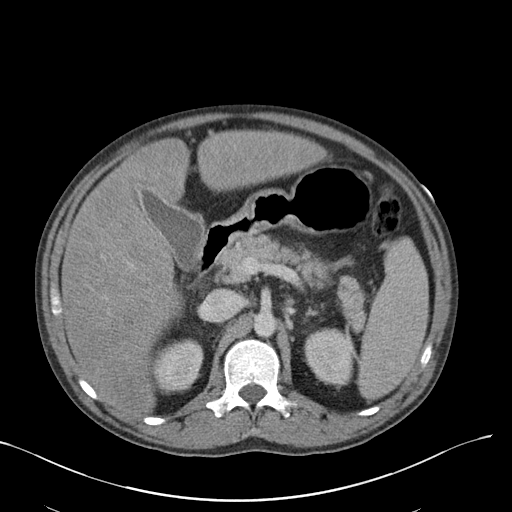
[im 78/110  soft-tissue]
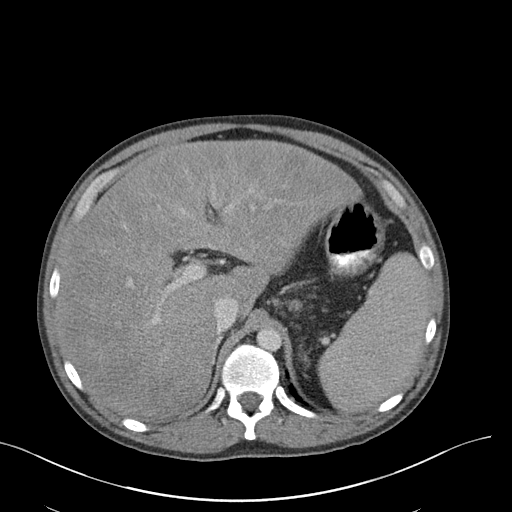
[im 78/110  bone]
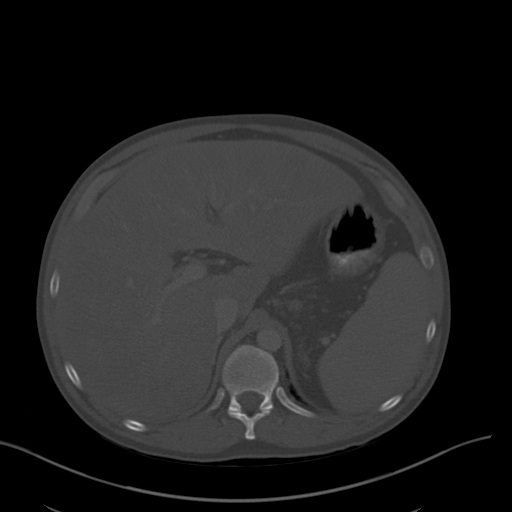
[im 87/110  soft-tissue]
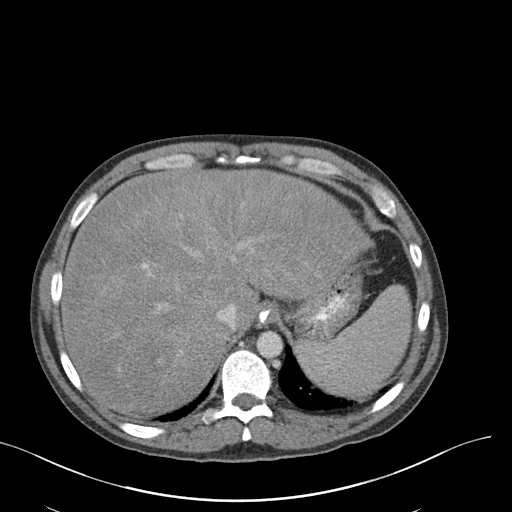
[im 96/110  soft-tissue]
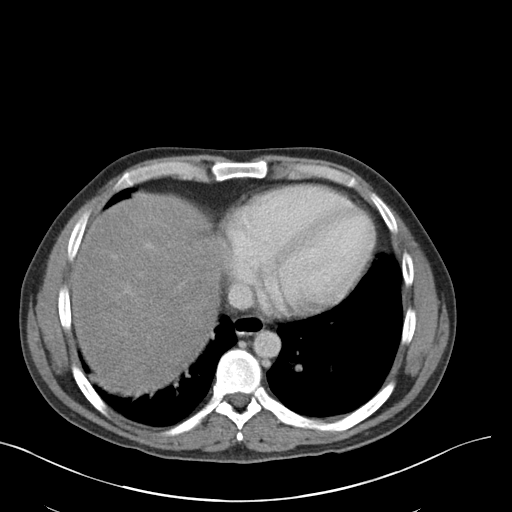
[im 105/110  soft-tissue]
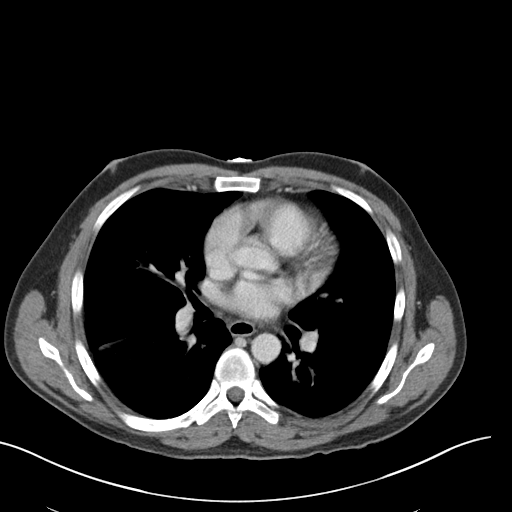

[Series 5: cor · coronal · 0.71mm/px · 3 of 133 slices shown]
[im 45/133  soft-tissue]
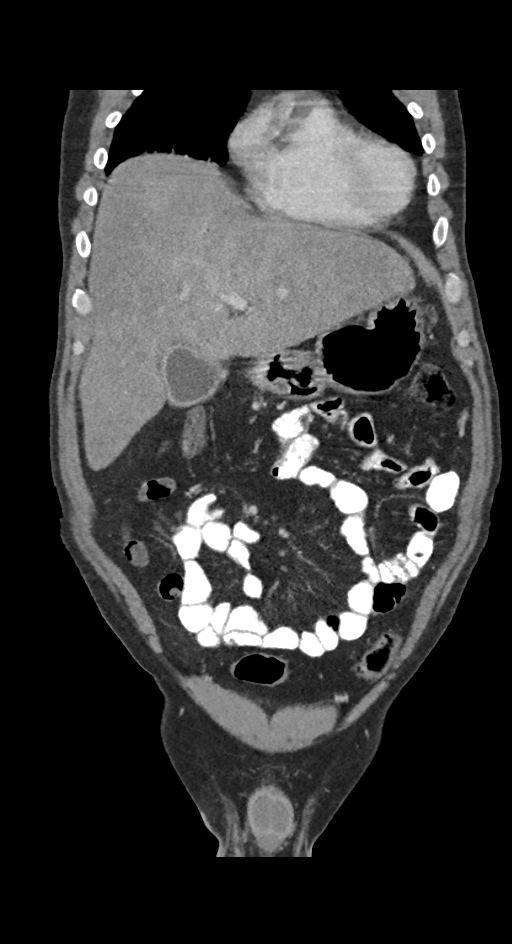
[im 59/133  soft-tissue]
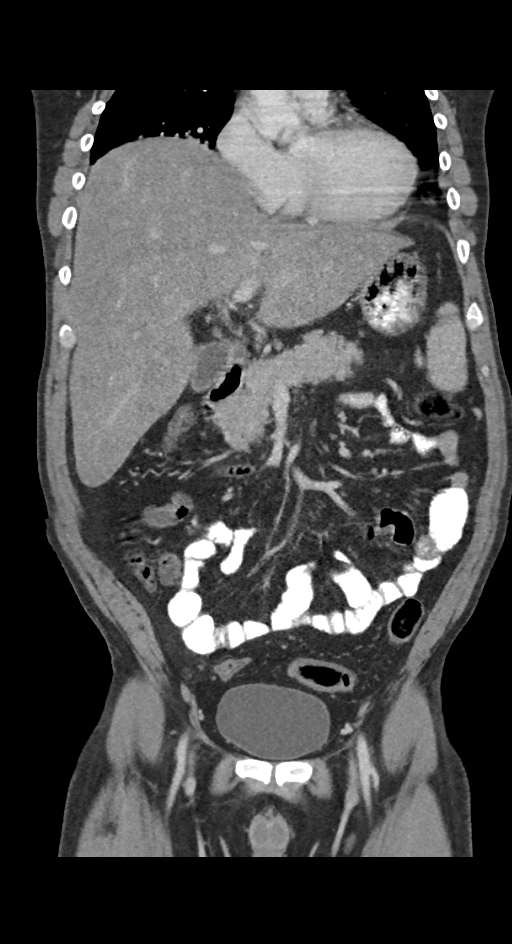
[im 74/133  soft-tissue]
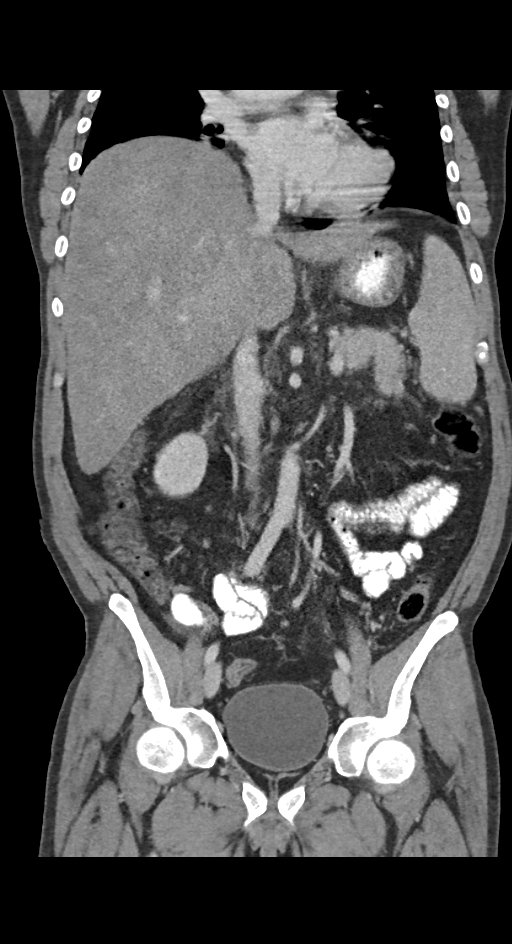

[15 of 46 positions shown; findings below may reference images not displayed]

FINDINGS: BODY WALL: Fat containing umbilical hernia

LOWER CHEST:

Mediastinum: Unremarkable.

Lungs/pleura: Elevated right diaphragm with mild basilar atelectasis

ABDOMEN/PELVIS:

Liver: Enlarged liver (25 cm craniocaudal span midclavicular line),
with diffuse patchy hypodensity or hypo-enhancement. No focal
abnormality or definitive nodular contour. There is a
recannulized/enlarged periumbilical vein.

Biliary: No evidence of biliary obstruction or stone.

Pancreas: Unremarkable.

Spleen: Enlarged at 14 cm craniocaudal span. No focal abnormality.

Adrenals: Unremarkable.

Kidneys and ureters: No hydronephrosis or stone.

Bladder: Unremarkable.

Bowel: No obstruction. Normal appendix.

Retroperitoneum: No mass or adenopathy.

Peritoneum: No free fluid or gas.

Reproductive: Unremarkable.

Vascular: No acute abnormality.

OSSEOUS: No acute abnormalities. No suspicious lytic or blastic
lesions.
IMPRESSION: 1. Hepatomegaly and heterogeneous liver parenchyma that could
represent fatty infiltration or hepatitis.
2. Although no overt cirrhotic change, there is evidence of portal
hypertension with splenomegaly and enlarged paraumbilical vein.
3. No evidence of biliary obstruction to explain jaundice.

## 2015-04-10 IMAGING — CR DG ABDOMEN 2V
2 series · 2 of 2 positions shown · non-contrast
Comparison: CT abdomen 10/28/2012

CLINICAL DATA: Mid abdominal pain and pressure

EXAM:
ABDOMEN - 2 VIEW

[w abdomen upright]
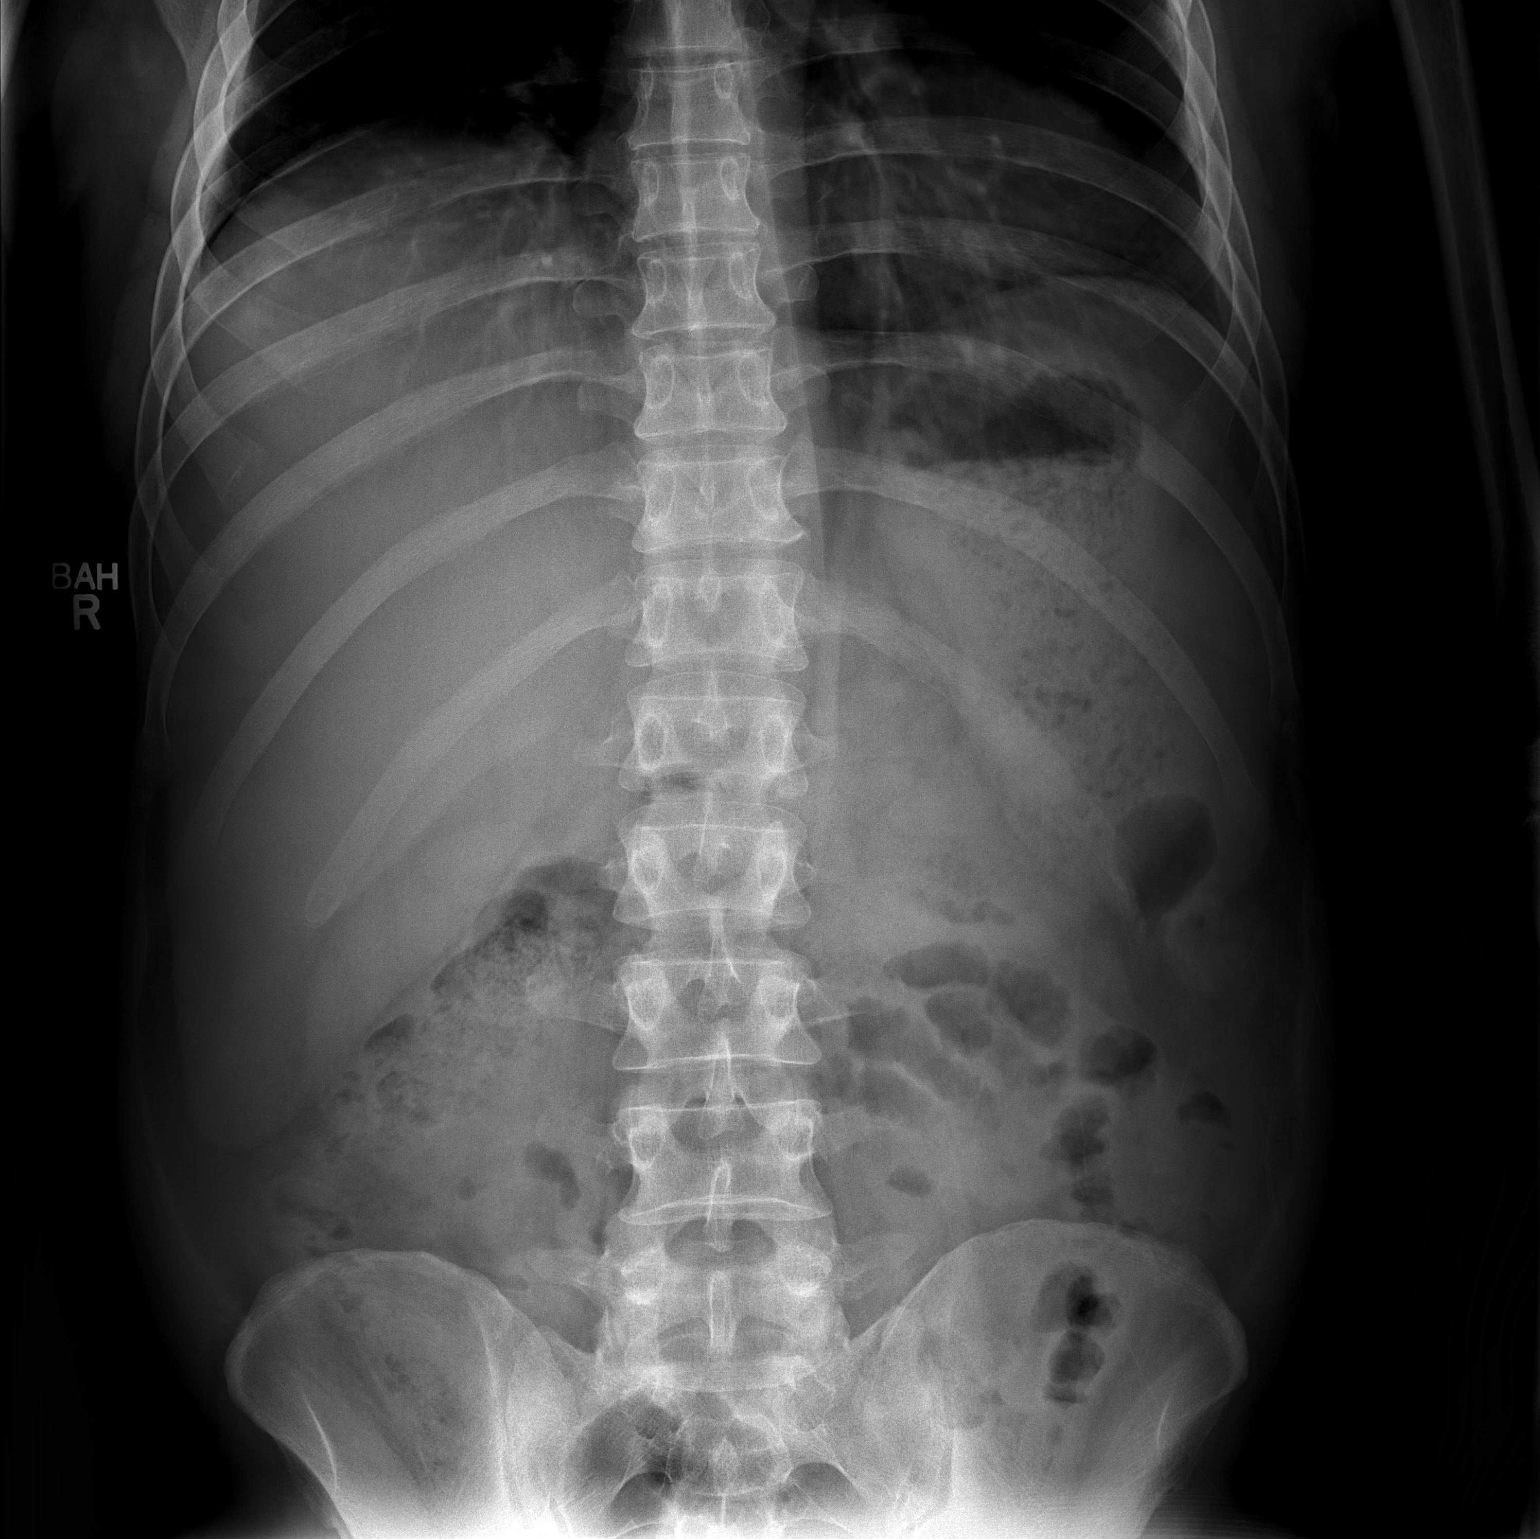

[t abdomen supine]
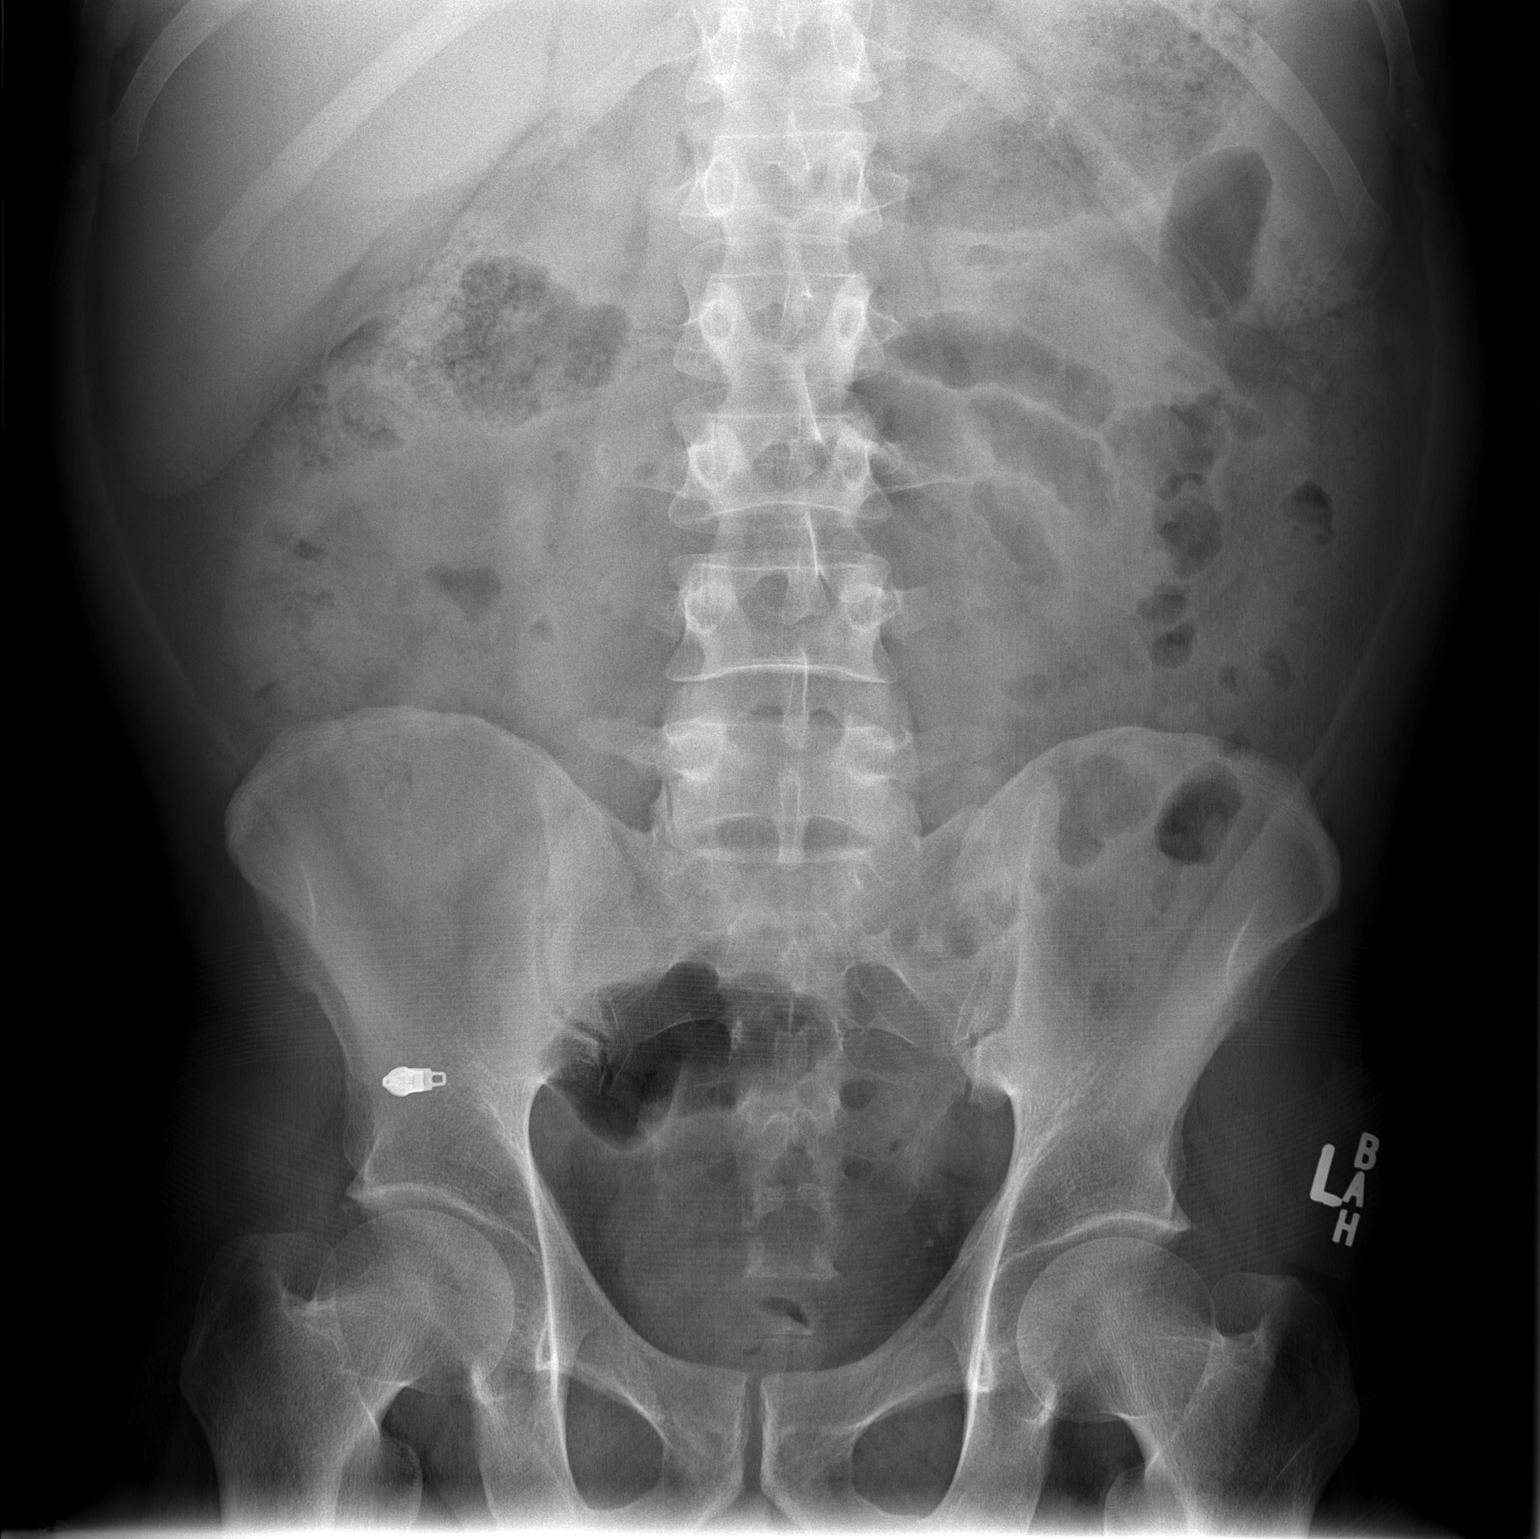

[2 of 2 positions shown; findings below may reference images not displayed]

FINDINGS: The liver is enlarged similar to comparison CT. There are no dilated
loops of large or small bowel. Moderate volume of stool in the
ascending colon. Gas in the rectum. No pathologic calcifications.
IMPRESSION: 1. Hepatomegaly.

2. No evidence of bowel obstruction.

## 2015-04-30 ENCOUNTER — Ambulatory Visit: Payer: Self-pay | Attending: Internal Medicine

## 2015-06-03 ENCOUNTER — Encounter: Payer: Self-pay | Admitting: Gastroenterology

## 2015-06-03 ENCOUNTER — Telehealth: Payer: Self-pay | Admitting: Gastroenterology

## 2015-06-03 ENCOUNTER — Encounter (INDEPENDENT_AMBULATORY_CARE_PROVIDER_SITE_OTHER): Payer: Self-pay | Admitting: Gastroenterology

## 2015-06-03 NOTE — Telephone Encounter (Signed)
NO SHOW; NO CALL

## 2015-06-03 NOTE — Progress Notes (Signed)
   Subjective:    Patient ID: Danny Arnold, male    DOB: 1975/09/28, 40 y.o.   MRN: [id number]   Patient no-showed/NOCALL today's appointment  HPI   Past Medical History  Diagnosis Date  . Anemia   . Alcoholic liver disease ([place])   . Bacteremia   . Alcoholic hepatitis     Review of Systems     Objective:   Physical Exam        Assessment & Plan:

## 2015-06-03 NOTE — Telephone Encounter (Signed)
PATIENT WAS A NO SHOW AND LETTER SENT  °

## 2015-07-14 ENCOUNTER — Encounter (INDEPENDENT_AMBULATORY_CARE_PROVIDER_SITE_OTHER): Payer: Self-pay | Admitting: Gastroenterology

## 2015-07-14 NOTE — Progress Notes (Signed)
   Subjective:    Patient ID: Danny Arnold, male    DOB: [birth year], 40 y.o.   MRN: [id number]  HPI   Past Medical History  Diagnosis Date  . Anemia   . Alcoholic liver disease ([place])   . Bacteremia   . Alcoholic hepatitis    Past Surgical History  Procedure Laterality Date  . Hand surgery    . Colonoscopy N/A 11/25/2012    [facility]  . Esophagogastroduodenoscopy N/A 11/25/2012    GRADE 1 VARICES  . Hemorrhoid banding  2014 x1   No Known Allergies  Review of Systems     Objective:   Physical Exam        Assessment & Plan:

## 2015-08-04 ENCOUNTER — Encounter: Payer: Self-pay | Admitting: Gastroenterology

## 2015-08-04 ENCOUNTER — Other Ambulatory Visit: Payer: Self-pay

## 2015-08-04 ENCOUNTER — Ambulatory Visit (INDEPENDENT_AMBULATORY_CARE_PROVIDER_SITE_OTHER): Payer: Self-pay | Admitting: Gastroenterology

## 2015-08-04 VITALS — BP 127/84 | HR 74 | Temp 97.9°F | Ht 68.0 in | Wt 188.6 lb

## 2015-08-04 DIAGNOSIS — I8510 Secondary esophageal varices without bleeding: Secondary | ICD-10-CM

## 2015-08-04 DIAGNOSIS — K7469 Other cirrhosis of liver: Secondary | ICD-10-CM

## 2015-08-04 DIAGNOSIS — K7030 Alcoholic cirrhosis of liver without ascites: Secondary | ICD-10-CM

## 2015-08-04 DIAGNOSIS — K429 Umbilical hernia without obstruction or gangrene: Secondary | ICD-10-CM

## 2015-08-04 DIAGNOSIS — K7460 Unspecified cirrhosis of liver: Secondary | ICD-10-CM

## 2015-08-04 MED ORDER — PROBIOTIC PO CAPS: ORAL_CAPSULE | ORAL | Status: DC

## 2015-08-04 NOTE — Progress Notes (Signed)
   Subjective:    Patient ID: Danny Arnold, male    DOB: 12-09-1975, 40 y.o.   MRN: [id number]  [name], NP  VIA INTERPRETER-MARIEL  HPI HAS GROWTH ON LEFT FACE AND IT'S GETTING BIGGER. CONCERNED BECAUSE HE GETS BLOATED/PRESSURE AFTER EATING THROUGH OUT THE DAY. WORKING A LITTLE MORE BUT NO SURGERY FOR HERNIA BECAUSE DIFFICULT TO SEE SURGERY. PER HPI OTHERWISE ALL SYSTEMS ARE NEGATIVE. COMPLETED HEPB VACCINE.  FEELS WELL FROM HIS LIVER. MAY HAVE DIZZY. HAPPENS WHEN HE DOESN'T HAVE THE BINDER SITTING TO STANDING. WEIGHT LAST TIME 176 LBS BUT IT WAS 159 LBS. BMS: NL. HAPPENS WHEN FOOD REALLY HOT NOT SPICY.   PT DENIES FEVER, CHILLS, HEMATOCHEZIA, nausea, vomiting, melena, diarrhea, CHEST PAIN, SHORTNESS OF BREATH, CHANGE IN BOWEL IN HABITS, constipation, abdominal pain, problems swallowing, OR heartburn or indigestion.  Past Medical History  Diagnosis Date  . Anemia   . Alcoholic liver disease ([place])   . Bacteremia   . Alcoholic hepatitis    Past Surgical History  Procedure Laterality Date  . Hand surgery    . Colonoscopy N/A 11/25/2012    [facility]  . Esophagogastroduodenoscopy N/A 11/25/2012    GRADE 1 VARICES  . Hemorrhoid banding  2014 x1   No Known Allergies  No current outpatient prescriptions on file.   Review of Systems PER HPI OTHERWISE ALL SYSTEMS ARE NEGATIVE.    Objective:   Physical Exam  Constitutional: He is oriented to person, place, and time. He appears well-developed and well-nourished. No distress.  HENT:  Head: Normocephalic and atraumatic.  Mouth/Throat: Oropharynx is clear and moist. No oropharyngeal exudate.  Soft mobile SQ CYSTIC LESION ON LEFT FACE, NON-TENDER, HALITOSIS PRESENT, POOR DENTITION.  Eyes: Pupils are equal, round, and reactive to light. No scleral icterus.  Neck: Normal range of motion. Neck supple.  Cardiovascular: Normal rate, regular rhythm and normal heart sounds.   Pulmonary/Chest: Effort normal and breath sounds normal. No  respiratory distress.  Abdominal: Soft. Bowel sounds are normal. He exhibits no distension. There is no tenderness.  Musculoskeletal: He exhibits no edema.  Lymphadenopathy:    He has no cervical adenopathy.  Neurological: He is alert and oriented to person, place, and time.  NO FOCAL DEFICITS  Psychiatric: He has a normal mood and affect.  Vitals reviewed.     Assessment & Plan:

## 2015-08-04 NOTE — Assessment & Plan Note (Signed)
GRADE 1 IN 2014.  EGD OCT 2017

## 2015-08-04 NOTE — Patient Instructions (Addendum)
AVOID ITEMS THAT CAUSE BLOATING & GAS. SEE INFO BELOW.  TAKE A PROBIOTIC DAILY FOR FOUR MOS.  COMPLETE FINANCIAL ASSISTANCE PAPERWORK.  SEE DR. Arnoldo Morale TO FIX HERNIA AND REMOVE CYST FROM LEFT FACE.  COMPLETE ULTRASOUND AND LABS.  FOLLOW UP IN 6 MOS.   BLOATING AND GAS PREVENTION  Although gas may be uncomfortable and embarrassing, it is not life-threatening. Understanding causes, ways to reduce symptoms, and treatment will help most people find some relief. Points to remember . Everyone has gas in the digestive tract. Marland Kitchen People often believe normal passage of gas to be excessive. . Gas comes from two main sources: swallowed air and normal breakdown of certain foods by harmless bacteria naturally present in the large intestine. . Many foods with carbohydrates can cause gas. Fats and proteins cause little gas. .  . Foods that may cause gas include o beans  o vegetables, such as broccoli, cabbage, brussels sprouts, onions, artichokes, and asparagus  o fruits, such as pears, apples, and peaches  o whole grains, such as whole wheat and bran  o soft drinks and fruit drinks  o milk and milk products, such as cheese and ice cream, and packaged foods prepared with lactose, such as bread, cereal, and salad dressing  o foods containing sorbitol, such as dietetic foods and sugar free candies and gums o  . The most common symptoms of gas are belching, flatulence, bloating, and abdominal pain. However, some of these symptoms are often caused by an intestinal disorder, such as irritable bowel syndrome, rather than too much gas. . The most common ways to reduce the discomfort of gas are changing diet, taking nonprescription medicines, and reducing the amount of air swallowed. . Digestive enzymes, such as lactase supplements, actually help digest carbohydrates and may allow people to eat foods that normally cause gas.

## 2015-08-04 NOTE — Assessment & Plan Note (Signed)
WELL COMPENSATED DISEASE. HEP B VACCINE COMPLETE. NO ETOH. IMMUNE TO HEP A.  EGD IN OCT 2017 LABS AND U/S Q6 MOS. FOLLOW UP IN 6 MOS.

## 2015-08-04 NOTE — Progress Notes (Signed)
ON RECALL  °

## 2015-08-04 NOTE — Assessment & Plan Note (Addendum)
REFER TO DR. Arnoldo Morale FOR HERNIA REPAIR AND REMOVAL OF CYST ON LEFT FACE.  PT'S AWARE HE NEEDS TO APPLY FOR CFA.

## 2015-08-04 NOTE — Progress Notes (Signed)
cc'ed to pcp °

## 2015-08-09 ENCOUNTER — Ambulatory Visit: Payer: Self-pay | Attending: Internal Medicine

## 2015-08-11 ENCOUNTER — Other Ambulatory Visit: Payer: Self-pay

## 2015-08-11 DIAGNOSIS — K429 Umbilical hernia without obstruction or gangrene: Secondary | ICD-10-CM

## 2015-08-13 LAB — COMPLETE METABOLIC PANEL WITH GFR
ALBUMIN: 4.2 g/dL (ref 3.6–5.1)
ALK PHOS: 75 U/L (ref 40–115)
ALT: 32 U/L (ref 9–46)
AST: 26 U/L (ref 10–40)
BUN: 16 mg/dL (ref 7–25)
CALCIUM: 9.3 mg/dL (ref 8.6–10.3)
CO2: 24 mmol/L (ref 20–31)
Chloride: 105 mmol/L (ref 98–110)
Creat: 1.02 mg/dL (ref 0.60–1.35)
Glucose, Bld: 90 mg/dL (ref 65–99)
POTASSIUM: 3.8 mmol/L (ref 3.5–5.3)
Sodium: 138 mmol/L (ref 135–146)
Total Bilirubin: 0.7 mg/dL (ref 0.2–1.2)
Total Protein: 7.7 g/dL (ref 6.1–8.1)

## 2015-08-13 LAB — CBC WITH DIFFERENTIAL/PLATELET
BASOS PCT: 0 %
Basophils Absolute: 0 cells/uL (ref 0–200)
EOS ABS: 192 {cells}/uL (ref 15–500)
Eosinophils Relative: 2 %
HCT: 46.7 % (ref 38.5–50.0)
Hemoglobin: 16 g/dL (ref 13.2–17.1)
LYMPHS PCT: 31 %
Lymphs Abs: 2976 cells/uL (ref 850–3900)
MCH: 29.6 pg (ref 27.0–33.0)
MCHC: 34.3 g/dL (ref 32.0–36.0)
MCV: 86.3 fL (ref 80.0–100.0)
MONOS PCT: 10 %
MPV: 9.7 fL (ref 7.5–12.5)
Monocytes Absolute: 960 cells/uL — ABNORMAL HIGH (ref 200–950)
NEUTROS PCT: 57 %
Neutro Abs: 5472 cells/uL (ref 1500–7800)
PLATELETS: 240 10*3/uL (ref 140–400)
RBC: 5.41 MIL/uL (ref 4.20–5.80)
RDW: 13.5 % (ref 11.0–15.0)
WBC: 9.6 10*3/uL (ref 3.8–10.8)

## 2015-08-13 LAB — PROTIME-INR
INR: 1
PROTHROMBIN TIME: 10.7 s (ref 9.0–11.5)

## 2015-08-13 LAB — AFP TUMOR MARKER: AFP-Tumor Marker: 1.9 ng/mL (ref <6.1)

## 2015-08-16 ENCOUNTER — Ambulatory Visit (HOSPITAL_COMMUNITY)
Admission: RE | Admit: 2015-08-16 | Discharge: 2015-08-16 | Disposition: A | Discharge status: Home / Self Care | Payer: Self-pay | Source: Ambulatory Visit | Attending: Gastroenterology | Admitting: Gastroenterology

## 2015-08-16 DIAGNOSIS — K824 Cholesterolosis of gallbladder: Secondary | ICD-10-CM | POA: Insufficient documentation

## 2015-08-16 DIAGNOSIS — K7469 Other cirrhosis of liver: Secondary | ICD-10-CM | POA: Insufficient documentation

## 2015-08-26 NOTE — Patient Instructions (Addendum)
Umbilical Herniorrhaphy Herniorrhaphy is surgery to repair a hernia. A hernia is the protrusion of   Danny Arnold  08/26/2015     @PREFPERIOPPHARMACY @   Your procedure is scheduled on 08/30/2015.  Report to [facility] at 6:15 A.M.  Call this number if you have problems the morning of surgery:  [phone]   Remember:  Do not eat food or drink liquids after midnight.  Take these medicines the morning of surgery with A SIP OF WATER None   Do not wear jewelry, make-up or nail polish.  Do not wear lotions, powders, or perfumes.  You may wear deoderant.  Do not shave 48 hours prior to surgery.  Men may shave face and neck.  Do not bring valuables to the hospital.  [facility] is not responsible for any belongings or valuables.  Contacts, dentures or bridgework may not be worn into surgery.  Leave your suitcase in the car.  After surgery it may be brought to your room.  For patients admitted to the hospital, discharge time will be determined by your treatment team.  Patients discharged the day of surgery will not be allowed to drive home.    Please read over the following fact sheets that you were given. Surgical Site Infection Prevention and Anesthesia Post-op Instructions     PATIENT INSTRUCTIONS POST-ANESTHESIA  IMMEDIATELY FOLLOWING SURGERY:  Do not drive or operate machinery for the first twenty four hours after surgery.  Do not make any important decisions for twenty four hours after surgery or while taking narcotic pain medications or sedatives.  If you develop intractable nausea and vomiting or a severe headache please notify your doctor immediately.  FOLLOW-UP:  Please make an appointment with your surgeon as instructed. You do not need to follow up with anesthesia unless specifically instructed to do so.  WOUND CARE INSTRUCTIONS (if applicable):  Keep a dry clean dressing on the anesthesia/puncture wound site if there is drainage.  Once the wound has quit draining  you may leave it open to air.  Generally you should leave the bandage intact for twenty four hours unless there is drainage.  If the epidural site drains for more than 36-48 hours please call the anesthesia department.  QUESTIONS?:  Please feel free to call your physician or the hospital operator if you have any questions, and they will be happy to assist you.       a part of an organ through an abdominal opening. An umbilical hernia means that your hernia is in the area around your navel. If the hernia is not repaired, the gap could get bigger. Your intestines or other tissues, such as fat, could get trapped in the gap. This can lead to other health problems, such as blocked intestines. If the hernia is fixed before problems set in, you may be allowed to go home the same day as the surgery (outpatient). LET [facility] CARE PROVIDER KNOW ABOUT:  Allergies to food or medicine.  Medicines taken, including vitamins, herbs, eye drops, over-the-counter medicines, and creams.  Use of steroids (by mouth or creams).  Previous problems with anesthetics or numbing medicines.  History of bleeding problems or blood clots.  Previous surgery.  Other health problems, including diabetes and kidney problems.  Possibility of pregnancy, if this applies. RISKS AND COMPLICATIONS  Pain.  Excessive bleeding.  Hematoma. This is a pocket of blood that collects under the surgery site.  Infection at the surgery site.  Numbness at the surgery site.  Swelling and  bruising.  Blood clots.  Intestinal damage (rare).  Scarring.  Skin damage.  Development of another hernia. This may require another surgery. BEFORE THE PROCEDURE  Ask your health care provider about changing or stopping your regular medicines. You may need to stop taking aspirin, nonsteroidal anti-inflammatory drugs (NSAIDs), vitamin E, and blood thinners as early as 2 weeks before the procedure.  Do not eat or drink for 8 hours  before the procedure, or as directed by your health care provider.  You might be asked to shower or wash with an antibacterial soap before the procedure.  Wear comfortable clothes that will be easy to put on after the procedure. PROCEDURE You will be given an intravenous (IV) tube. A needle will be inserted in your arm. Medicine will flow directly into your body through this needle. You might be given medicine to help you relax (sedative). You will be given medicine that numbs the area (local anesthetic) or medicine that makes you sleep (general anesthetic). If you have open surgery:  The surgeon will make a cut (incision) in your abdomen.  The gap in the muscle wall will be repaired. The surgeon may sew the edges together over the gap or use a mesh material to strengthen the area. When mesh is used, the body grows new, strong tissue into and around it. This new tissue closes the gap.  A drain might be put in to remove excess fluid from the body after surgery.  The surgeon will close the incision with stitches, glue, or staples. If you have laparoscopic surgery:  The surgeon will make several small incisions in your abdomen.  A thin, lighted tube (laparoscope) will be inserted into the abdomen through an incision. A camera is attached to the laparoscope that allows the surgeon to see inside the abdomen.  Tools will be inserted through the other incisions to repair the hernia. Usually, mesh is used to cover the gap.  The surgeon will close the incisions with stitches. AFTER THE PROCEDURE  You will be taken to a recovery area. A nurse will watch and check your progress.  When you are awake, feeling well, and taking fluids well, you may be allowed to go home. In some cases, you may need to stay overnight in the hospital.  Arrange for someone to drive you home.   This information is not intended to replace advice given to you by your health care provider. Make sure you discuss any  questions you have with your health care provider.   Document Released: 04/28/2008 Document Revised: 02/20/2014 Document Reviewed: 05/03/2011 Elsevier Interactive Patient Education 2016 [place] umbilical  (Umbilical Herniorrhaphy) La herniorrafia es una ciruga para reparar una hernia. Una hernia es la protrusin de una parte de un rgano a travs de una abertura abdominal. Ardelia Mems hernia umbilical significa que la hernia se encuentra en el rea alrededor del ombligo. Si la hernia no se repara, la abertura podra ser ms grande. Sus intestinos u otros tejidos, como la [place], podran quedar atrapados en la abertura. Esto puede llevar a otros problemas de salud, tales como una obstruccin intestinal. Si la hernia se repara antes de que los Chief Financial Officer, es posible que le permitan regresar a casa el mismo da de la ciruga (paciente ambulatorio).  INFORME A SU MDICO SOBRE:   Alergias a alimentos o medicamentos.  Medicamentos que [place], incluyendo vitaminas, hierbas, gotas oftlmicas, medicamentos de venta libre y cremas.  Uso de corticoides (por va oral o cremas).  Problemas anteriores  debido a anestsicos o a medicamentos que disminuyen la sensibilidad.  Antecedentes de hemorragias o cogulos sanguneos.  Cirugas anteriores.  Otros problemas de salud, incluyendo diabetes y problemas renales.  Posibilidad de embarazo, si corresponde. Max.  Hemorragia excesiva.  Hematoma Se trata de una acumulacin de sangre que se forma debajo de la zona de la [place].  Infeccin en el sitio de la [place].  Adormecimiento en el sitio de la [place].  Hinchazn y moretones.  Cogulos sanguneos.  Dao intestinal (poco frecuente).  Cicatrizacin.  Dao en la piel.  Desarrollo de otra hernia. Esto puede requerir [place]. ANTES DEL PROCEDIMIENTO   Consulte a su mdico si debe cambiar o suspender los medicamentos que toma  habitualmente. Es posible que tenga que dejar de tomar aspirinas, medicamentos antiinflamatorios no esteroideos (AINE), vitamina E y anticoagulantes 2 semanas antes del procedimiento.  No coma ni beba 8 horas antes del procedimiento, o segn le indique el mdico.  Le indicarn que se bae o se lave con un jabn antibacterial antes del procedimiento.  Use ropa cmoda que sean fciles de poner despus del procedimiento. PROCEDIMIENTO  Le colocarn una va intravenosa (IV). Le insertarn una aguja en el brazo. Los medicamentos fluirn directamente hacia su cuerpo a travs de [place]. Le administrarn medicamentos para ayudarle a relajarse (sedantes). Le aplicarn un medicamento para adormecer el rea (anestesia local)o un medicamento que lo har dormir (anestesia general).  Si le harn una ciruga abierta:   El cirujano har un corte (incisin) en el abdomen.  Se reparar la abertura de la pared muscular . El cirujano suturar los bordes o Risk manager un material de [place] para Tax adviser rea. Cuando se [employer], el organismo desarrolla un tejido fuerte sobre la [place]. Este nuevo tejido cierra la abertura.  Le colocarn un drenaje para eliminar el exceso de lquido del cuerpo despus de la [place].  El cirujano cerrar la incisin con puntos de sutura, pegamento o grapas. Si le hacen una ciruga laparoscpica:   El cirujano har varias incisiones pequeas en el abdomen.  Se insertar un tubo delgado con una fuente de luz (laparoscopio) en el abdomen a travs de una de las incisiones. Carlota Raspberry unida al laparoscopio le permitir al Gretchen Short ver el interior del abdomen.  Se insertarn instrumentos a travs de las otras incisiones para reparar la hernia. Por lo general, se [place] para cubrir la abertura.  El cirujano cerrar las incisiones con puntos de sutura. DESPUS DEL PROCEDIMIENTO   Lo trasladarn una sala de recuperacin. Una enfermera lo observar y Environmental manager.  Una vez que despierte, se encuentre bien y pueda ingerir lquidos, podr volver a su casa. En algunos casos puede ser necesario que pase una noche en el hospital.  Pdale a alguna persona que la lleve a su casa.   Esta informacin no tiene Marine scientist el consejo del mdico. Asegrese de hacerle al mdico cualquier pregunta que tenga.   Document Released: 08/01/2011 Elsevier Interactive Patient Education 2016 [place] umbilical - Cuidados posteriores  (Umbilical Herniorrhaphy, Care After) Siga estas instrucciones durante las prximas semanas. Estas indicaciones le proporcionan informacin general acerca de cmo deber cuidarse despus del procedimiento. El mdico tambin podr darle instrucciones ms especficas. El tratamiento ha sido planificado segn las prcticas mdicas actuales, pero en algunos casos pueden ocurrir problemas. Comunquese con el mdico si tiene algn problema o tiene preguntas despus del procedimiento.  [place]  EN EL HOGAR   Si le han recetado antibiticos, tmelos segn las indicaciones. Finalice la prescripcin completa, aunque se sienta mejor.  Slo tome medicamentos de venta libre o recetados para [employer], la fiebre, o el [place], segn las indicaciones de su mdico. No tome aspirina. Puede ocasionar hemorragias.  No deje que la zona del corte quirrgico se moje (incisin) excepto que su mdico lo autorice.  Evite levantar objetos que pesen ms de 10 libras (4,5 kg) durante 8 semanas despus de la [place].  Evite la actividad sexual durante 5 semanas despus de la ciruga o como lo indique su mdico.  No conduzca automviles mientras est tomando medicamentos para el dolor recetados.  Podr regresar a sus actividades diarias normales despus de [place] indicaciones de su mdico. SOLICITE ATENCIN MDICA SI:   Nota que pierde sangre o lquido por el sitio de la  incisin.  Siente dolor, observa enrojecimiento o hinchazn.  El dolor en el sitio quirrgico empeora o no se alivia con los medicamentos.  Tiene dificultad para orinar.  Siente nuseas o vmitos durante ms de 2 das despus de la [place].  Nota un bulto en el abdomen que aparece nuevamente despus del procedimiento. SOLICITE ATENCIN MDICA DE INMEDIATO SI:   Tiene fiebre.  Siente nuseas o vmitos que no se detienen.   Esta informacin no tiene Marine scientist el consejo del mdico. Asegrese de hacerle al mdico cualquier pregunta que tenga.   Document Released: 08/01/2011 Document Revised: 02/20/2014 Elsevier Interactive Patient Education [employer].

## 2015-08-27 ENCOUNTER — Encounter (HOSPITAL_COMMUNITY)
Admission: RE | Admit: 2015-08-27 | Discharge: 2015-08-27 | Disposition: A | Discharge status: Home / Self Care | Payer: Self-pay | Source: Ambulatory Visit | Attending: General Surgery | Admitting: General Surgery

## 2015-08-27 ENCOUNTER — Encounter (HOSPITAL_COMMUNITY): Payer: Self-pay

## 2015-08-27 DIAGNOSIS — Z01812 Encounter for preprocedural laboratory examination: Secondary | ICD-10-CM | POA: Insufficient documentation

## 2015-08-27 LAB — BASIC METABOLIC PANEL
ANION GAP: 9 (ref 5–15)
BUN: 16 mg/dL (ref 6–20)
CHLORIDE: 104 mmol/L (ref 101–111)
CO2: 24 mmol/L (ref 22–32)
CREATININE: 1.1 mg/dL (ref 0.61–1.24)
Calcium: 9.3 mg/dL (ref 8.9–10.3)
GFR calc non Af Amer: >60 mL/min (ref >60)
Glucose, Bld: 110 mg/dL — ABNORMAL HIGH (ref 65–99)
Potassium: 3.7 mmol/L (ref 3.5–5.1)
SODIUM: 137 mmol/L (ref 135–145)

## 2015-08-27 LAB — CBC WITH DIFFERENTIAL/PLATELET
BASOS PCT: 0 %
Basophils Absolute: 0 10*3/uL (ref 0.0–0.1)
EOS ABS: 0.2 10*3/uL (ref 0.0–0.7)
Eosinophils Relative: 2 %
HEMATOCRIT: 48 % (ref 39.0–52.0)
HEMOGLOBIN: 16.8 g/dL (ref 13.0–17.0)
Lymphocytes Relative: 28 %
Lymphs Abs: 2.9 10*3/uL (ref 0.7–4.0)
MCH: 30.2 pg (ref 26.0–34.0)
MCHC: 35 g/dL (ref 30.0–36.0)
MCV: 86.3 fL (ref 78.0–100.0)
MONOS PCT: 8 %
Monocytes Absolute: 0.8 10*3/uL (ref 0.1–1.0)
NEUTROS ABS: 6.3 10*3/uL (ref 1.7–7.7)
NEUTROS PCT: 62 %
Platelets: 247 10*3/uL (ref 150–400)
RBC: 5.56 MIL/uL (ref 4.22–5.81)
RDW: 12.7 % (ref 11.5–15.5)
WBC: 10.3 10*3/uL (ref 4.0–10.5)

## 2015-08-27 LAB — PROTIME-INR
INR: 1.13 (ref 0.00–1.49)
PROTHROMBIN TIME: 14.7 s (ref 11.6–15.2)

## 2015-08-27 NOTE — H&P (Signed)
  NTS SOAP Note  Vital Signs:  Vitals as of: [id number]: Systolic [id number]: Diastolic 82: Heart Rate 72: Temp 98.42F (Temporal): Height 62ft 8in: Weight 188Lbs 0 Ounces: Pain Level 3: BMI 28.59   BMI : 28.59 kg/m2  Subjective: This 40 year old male presents for of an umbilical hernia.  Has been present for some time.  Has been increasing in size and causing him discomfort.  Made worse with straining.  Resolves somewhat when lying down.  No nausea, vomiting.  Talked with patient thru interpreter.  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 Allergies: nkda Medications: align   Social History:Reviewed  Social History  Preferred Language: English Race:  Other Ethnicity: Not Hispanic / Latino Age: 65 year Marital Status:  S Alcohol: quit   Smoking Status: Never smoker reviewed on 08/26/2015 Functional Status reviewed on 08/26/2015 ------------------------------------------------ Bathing: Normal Cooking: Normal Dressing: Normal Driving: Normal Eating: Normal Managing Meds: Normal Oral Care: Normal Shopping: Normal Toileting: Normal Transferring: Normal Walking: Normal Cognitive Status reviewed on 08/26/2015 ------------------------------------------------ Attention: Normal Decision Making: Normal Language: Normal Memory: Normal Motor: Normal Perception: Normal Problem Solving: Normal Visual and Spatial: Normal   Family History:Reviewed  Family Health History Family History is Unknown    Objective Information: General:Well  appearing, well nourished in no distress. Skin:no rash or prominent lesions Head:Atraumatic; no masses; no abnormalities Neck:Supple without lymphadenopathy.  Heart:RRR, no murmur or gallop.  Normal S1, S2.  No S3, S4.  Lungs:CTA bilaterally, no wheezes, rhonchi, rales.  Breathing unlabored. Abdomen:Soft, NT/ND, normal bowel sounds, no HSM, no masses.  No peritoneal signs.  Large reducible umbilical hernia present.  No ascites present.  Assessment:Umbilical hernia  Diagnoses: [id number]  [id number] Umbilical hernia (Umbilical hernia without obstruction or gangrene)  Procedures: [id number] - OFFICE OUTPATIENT NEW 30 MINUTES    Plan:  Scheduled for umbilical herniorrhaphy with mesh on 08/30/15.   Patient Education:Alternative treatments to surgery were discussed with patient (and family).Risks and benefits  of procedure including bleeding, infection, recurrence, and mesh usewere fully explained to the patient (and family) who gave informed consent. Patient/family questions were addressed.  Follow-up:Pending Surgery

## 2015-08-30 ENCOUNTER — Encounter (HOSPITAL_COMMUNITY): Payer: Self-pay | Admitting: *Deleted

## 2015-08-30 ENCOUNTER — Encounter (HOSPITAL_COMMUNITY)
Admission: RE | Disposition: A | Discharge status: Home / Self Care | Payer: Self-pay | Source: Ambulatory Visit | Attending: General Surgery

## 2015-08-30 ENCOUNTER — Ambulatory Visit (HOSPITAL_COMMUNITY): Payer: Self-pay | Admitting: Anesthesiology

## 2015-08-30 ENCOUNTER — Ambulatory Visit (HOSPITAL_COMMUNITY)
Admission: RE | Admit: 2015-08-30 | Discharge: 2015-08-30 | Disposition: A | Discharge status: Home / Self Care | Payer: Self-pay | Source: Ambulatory Visit | Attending: General Surgery | Admitting: General Surgery

## 2015-08-30 DIAGNOSIS — Z87891 Personal history of nicotine dependence: Secondary | ICD-10-CM | POA: Insufficient documentation

## 2015-08-30 DIAGNOSIS — K429 Umbilical hernia without obstruction or gangrene: Secondary | ICD-10-CM | POA: Insufficient documentation

## 2015-08-30 HISTORY — PX: UMBILICAL HERNIA REPAIR: SHX196

## 2015-08-30 SURGERY — REPAIR, HERNIA, UMBILICAL, ADULT
Anesthesia: General | Site: Abdomen

## 2015-08-30 MED ORDER — BUPIVACAINE HCL (PF) 0.5 % IJ SOLN
INTRAMUSCULAR | Status: DC | PRN
  Administered 2015-08-30: 9 mL

## 2015-08-30 MED ORDER — ROCURONIUM BROMIDE 100 MG/10ML IV SOLN
INTRAVENOUS | Status: DC | PRN
  Administered 2015-08-30: 35 mg via INTRAVENOUS

## 2015-08-30 MED ORDER — FENTANYL CITRATE (PF) 100 MCG/2ML IJ SOLN
INTRAMUSCULAR | Status: DC | PRN
  Administered 2015-08-30 (×3): 50 ug via INTRAVENOUS

## 2015-08-30 MED ORDER — FENTANYL CITRATE (PF) 100 MCG/2ML IJ SOLN
25.0000 ug | INTRAMUSCULAR | Status: DC | PRN
  Administered 2015-08-30 (×2): 25 ug via INTRAVENOUS
  Filled 2015-08-30: qty 2

## 2015-08-30 MED ORDER — KETOROLAC TROMETHAMINE 30 MG/ML IJ SOLN
30.0000 mg | Freq: Once | INTRAMUSCULAR | Status: AC
  Administered 2015-08-30: 30 mg via INTRAVENOUS
  Filled 2015-08-30: qty 1

## 2015-08-30 MED ORDER — MIDAZOLAM HCL 2 MG/2ML IJ SOLN
INTRAMUSCULAR | Status: AC
  Filled 2015-08-30: qty 2

## 2015-08-30 MED ORDER — ONDANSETRON HCL 4 MG/2ML IJ SOLN
INTRAMUSCULAR | Status: AC
  Filled 2015-08-30: qty 2

## 2015-08-30 MED ORDER — GLYCOPYRROLATE 0.2 MG/ML IJ SOLN
INTRAMUSCULAR | Status: AC
  Filled 2015-08-30: qty 1

## 2015-08-30 MED ORDER — NEOSTIGMINE METHYLSULFATE 10 MG/10ML IV SOLN
INTRAVENOUS | Status: AC
  Filled 2015-08-30: qty 1

## 2015-08-30 MED ORDER — BUPIVACAINE HCL (PF) 0.5 % IJ SOLN
INTRAMUSCULAR | Status: AC
  Filled 2015-08-30: qty 30

## 2015-08-30 MED ORDER — CEFAZOLIN SODIUM-DEXTROSE 2-4 GM/100ML-% IV SOLN
2.0000 g | INTRAVENOUS | Status: AC
  Administered 2015-08-30: 2 g via INTRAVENOUS

## 2015-08-30 MED ORDER — GLYCOPYRROLATE 0.2 MG/ML IJ SOLN
INTRAMUSCULAR | Status: DC | PRN
  Administered 2015-08-30: 0.6 mg via INTRAVENOUS
  Administered 2015-08-30: 0.2 mg via INTRAVENOUS

## 2015-08-30 MED ORDER — LIDOCAINE HCL (PF) 1 % IJ SOLN
INTRAMUSCULAR | Status: AC
  Filled 2015-08-30: qty 5

## 2015-08-30 MED ORDER — LIDOCAINE HCL (CARDIAC) 20 MG/ML IV SOLN
INTRAVENOUS | Status: DC | PRN
  Administered 2015-08-30: 50 mg via INTRAVENOUS

## 2015-08-30 MED ORDER — MIDAZOLAM HCL 2 MG/2ML IJ SOLN
1.0000 mg | INTRAMUSCULAR | Status: DC | PRN
  Administered 2015-08-30: 2 mg via INTRAVENOUS

## 2015-08-30 MED ORDER — NEOSTIGMINE METHYLSULFATE 10 MG/10ML IV SOLN
INTRAVENOUS | Status: DC | PRN
  Administered 2015-08-30: 4 mg via INTRAVENOUS

## 2015-08-30 MED ORDER — ROCURONIUM BROMIDE 50 MG/5ML IV SOLN
INTRAVENOUS | Status: AC
  Filled 2015-08-30: qty 1

## 2015-08-30 MED ORDER — POVIDONE-IODINE 10 % EX OINT
TOPICAL_OINTMENT | CUTANEOUS | Status: AC
  Filled 2015-08-30: qty 1

## 2015-08-30 MED ORDER — GLYCOPYRROLATE 0.2 MG/ML IJ SOLN
INTRAMUSCULAR | Status: AC
  Filled 2015-08-30: qty 3

## 2015-08-30 MED ORDER — OXYCODONE HCL 10 MG PO TABS: 10.0000 mg | ORAL_TABLET | ORAL | Status: DC | PRN

## 2015-08-30 MED ORDER — SUCCINYLCHOLINE CHLORIDE 20 MG/ML IJ SOLN
INTRAMUSCULAR | Status: AC
  Filled 2015-08-30: qty 1

## 2015-08-30 MED ORDER — ONDANSETRON HCL 4 MG/2ML IJ SOLN: 4.0000 mg | Freq: Once | INTRAMUSCULAR | Status: DC | PRN

## 2015-08-30 MED ORDER — ONDANSETRON HCL 4 MG/2ML IJ SOLN
4.0000 mg | Freq: Once | INTRAMUSCULAR | Status: AC
  Administered 2015-08-30: 4 mg via INTRAVENOUS

## 2015-08-30 MED ORDER — PROPOFOL 10 MG/ML IV BOLUS
INTRAVENOUS | Status: AC
  Filled 2015-08-30: qty 40

## 2015-08-30 MED ORDER — SODIUM CHLORIDE 0.9 % IR SOLN
Status: DC | PRN
  Administered 2015-08-30: 1000 mL

## 2015-08-30 MED ORDER — FENTANYL CITRATE (PF) 100 MCG/2ML IJ SOLN
INTRAMUSCULAR | Status: AC
  Filled 2015-08-30: qty 2

## 2015-08-30 MED ORDER — LACTATED RINGERS IV SOLN
INTRAVENOUS | Status: DC
  Administered 2015-08-30: 1000 mL via INTRAVENOUS

## 2015-08-30 MED ORDER — PROPOFOL 10 MG/ML IV BOLUS
INTRAVENOUS | Status: DC | PRN
  Administered 2015-08-30: 150 mg via INTRAVENOUS
  Administered 2015-08-30: 50 mg via INTRAVENOUS

## 2015-08-30 MED ORDER — POVIDONE-IODINE 10 % OINT PACKET
TOPICAL_OINTMENT | CUTANEOUS | Status: DC | PRN
  Administered 2015-08-30: 1 via TOPICAL

## 2015-08-30 MED ORDER — CEFAZOLIN SODIUM-DEXTROSE 2-4 GM/100ML-% IV SOLN
INTRAVENOUS | Status: AC
  Filled 2015-08-30: qty 100

## 2015-08-30 SURGICAL SUPPLY — 34 items
BAG HAMPER (MISCELLANEOUS) ×3 IMPLANT
BLADE SURG SZ11 CARB STEEL (BLADE) ×3 IMPLANT
CHLORAPREP W/TINT 26ML (MISCELLANEOUS) ×3 IMPLANT
CLOTH BEACON ORANGE TIMEOUT ST (SAFETY) ×3 IMPLANT
COVER LIGHT HANDLE STERIS (MISCELLANEOUS) ×6 IMPLANT
DECANTER SPIKE VIAL GLASS SM (MISCELLANEOUS) ×3 IMPLANT
ELECT REM PT RETURN 9FT ADLT (ELECTROSURGICAL) ×3
ELECTRODE REM PT RTRN 9FT ADLT (ELECTROSURGICAL) ×1 IMPLANT
GLOVE BIOGEL PI IND STRL 7.0 (GLOVE) ×2 IMPLANT
GLOVE BIOGEL PI INDICATOR 7.0 (GLOVE) ×4
GLOVE ECLIPSE 6.5 STRL STRAW (GLOVE) ×3 IMPLANT
GLOVE EXAM NITRILE MD LF STRL (GLOVE) ×3 IMPLANT
GLOVE SURG SS PI 7.5 STRL IVOR (GLOVE) ×3 IMPLANT
GOWN STRL REUS W/ TWL LRG LVL3 (GOWN DISPOSABLE) ×1 IMPLANT
GOWN STRL REUS W/TWL LRG LVL3 (GOWN DISPOSABLE) ×5 IMPLANT
INST SET MINOR GENERAL (KITS) ×3 IMPLANT
KIT ROOM TURNOVER APOR (KITS) ×3 IMPLANT
MANIFOLD NEPTUNE II (INSTRUMENTS) ×3 IMPLANT
MESH VENTRALEX ST 1-7/10 CRC S (Mesh General) ×3 IMPLANT
NEEDLE HYPO 25X1 1.5 SAFETY (NEEDLE) ×3 IMPLANT
NS IRRIG 1000ML POUR BTL (IV SOLUTION) ×3 IMPLANT
PACK MINOR (CUSTOM PROCEDURE TRAY) ×3 IMPLANT
PAD ARMBOARD 7.5X6 YLW CONV (MISCELLANEOUS) ×3 IMPLANT
SET BASIN LINEN APH (SET/KITS/TRAYS/PACK) ×3 IMPLANT
SPONGE GAUZE 2X2 8PLY STER LF (GAUZE/BANDAGES/DRESSINGS) ×2
SPONGE GAUZE 2X2 8PLY STRL LF (GAUZE/BANDAGES/DRESSINGS) ×4 IMPLANT
STAPLER VISISTAT (STAPLE) ×3 IMPLANT
SUT ETHIBOND NAB MO 7 #0 18IN (SUTURE) ×3 IMPLANT
SUT VIC AB 2-0 CT2 27 (SUTURE) ×3 IMPLANT
SUT VIC AB 3-0 SH 27 (SUTURE) ×2
SUT VIC AB 3-0 SH 27X BRD (SUTURE) ×1 IMPLANT
SUT VICRYL AB 3 0 TIES (SUTURE) IMPLANT
SYR CONTROL 10ML LL (SYRINGE) ×3 IMPLANT
TAPE CLOTH SURG 4X10 WHT LF (GAUZE/BANDAGES/DRESSINGS) ×3 IMPLANT

## 2015-08-30 NOTE — Discharge Instructions (Signed)
Umbilical Herniorrhaphy, Care After Refer to this sheet in the next few weeks. These instructions provide you with information on caring for yourself after your procedure. Your health care provider may also give you more specific instructions. Your treatment has been planned according to current medical practices, but problems sometimes occur. Call your health care provider if you have any problems or questions after your procedure. HOME CARE INSTRUCTIONS  If you are given antibiotic medicine, take it as directed. Finish it even if you start to feel better.  Only take over-the-counter or prescription medicines for pain, fever, or discomfort as directed by your health care provider. Do not take aspirin. It can cause bleeding.  Do not get your surgical cut (incision) area wet unless your health care provider says it is okay.  Avoid lifting objects heavier than 10 lb (4.5 kg) for 8 weeks after surgery.  Avoid sexual activity for 5 weeks after surgery or as directed by your health care provider.  Do not drive while taking prescription pain medicine.  You may return to your other normal, daily activities after 3 days or as directed by your health care provider. SEEK MEDICAL CARE IF:  You notice blood or fluid leaking from the surgical site.  Your incision area becomes red or swollen.  Your pain at the surgical site becomes worse or is not relieved by medicine.  You have problems urinating.  You feel nauseous or vomit more than 2 days after surgery.  You notice the bulge in your abdomen returns after the procedure. SEEK IMMEDIATE MEDICAL CARE IF:  You have a fever.  You have nausea or vomiting that will not stop.   This information is not intended to replace advice given to you by your health care provider. Make sure you discuss any questions you have with your health care provider.   Document Released: 08/01/2011 Document Revised: 02/20/2014 Document Reviewed: 08/01/2011 Elsevier  Interactive Patient Education 2016 [place] umbilical - Cuidados posteriores  (Umbilical Herniorrhaphy, Care After) Siga estas instrucciones durante las prximas semanas. Estas indicaciones le proporcionan informacin general acerca de cmo deber cuidarse despus del procedimiento. El mdico tambin podr darle instrucciones ms especficas. El tratamiento ha sido planificado segn las prcticas mdicas actuales, pero en algunos casos pueden ocurrir problemas. Comunquese con el mdico si tiene algn problema o tiene preguntas despus del procedimiento.  INSTRUCCIONES PARA EL CUIDADO EN EL HOGAR   Si le han recetado antibiticos, tmelos segn las indicaciones. Finalice la prescripcin completa, aunque se sienta mejor.  Slo tome medicamentos de venta libre o recetados para [employer], la fiebre, o el [place], segn las indicaciones de su mdico. No tome aspirina. Puede ocasionar hemorragias.  No deje que la zona del corte quirrgico se moje (incisin) excepto que su mdico lo autorice.  Evite levantar objetos que pesen ms de 10 libras (4,5 kg) durante 8 semanas despus de la [place].  Evite la actividad sexual durante 5 semanas despus de la ciruga o como lo indique su mdico.  No conduzca automviles mientras est tomando medicamentos para el dolor recetados.  Podr regresar a sus actividades diarias normales despus de [place] indicaciones de su mdico. SOLICITE ATENCIN MDICA SI:   Nota que pierde sangre o lquido por el sitio de la incisin.  Siente dolor, observa enrojecimiento o hinchazn.  El dolor en el sitio quirrgico empeora o no se alivia con los medicamentos.  Tiene dificultad para orinar.  Siente nuseas o vmitos  durante ms de 2 das despus de la [place].  Nota un bulto en el abdomen que aparece nuevamente despus del procedimiento. SOLICITE ATENCIN MDICA DE INMEDIATO SI:   Tiene fiebre.  Siente nuseas o vmitos  que no se detienen.   Esta informacin no tiene Marine scientist el consejo del mdico. Asegrese de hacerle al mdico cualquier pregunta que tenga.   Document Released: 08/01/2011 Document Revised: 02/20/2014 Elsevier Interactive Patient Education 2016 [place] POST-ANESTHESIA  IMMEDIATELY FOLLOWING SURGERY:  Do not drive or operate machinery for the first twenty four hours after surgery.  Do not make any important decisions for twenty four hours after surgery or while taking narcotic pain medications or sedatives.  If you develop intractable nausea and vomiting or a severe headache please notify your doctor immediately.  FOLLOW-UP:  Please make an appointment with your surgeon as instructed. You do not need to follow up with anesthesia unless specifically instructed to do so.  WOUND CARE INSTRUCTIONS (if applicable):  Keep a dry clean dressing on the anesthesia/puncture wound site if there is drainage.  Once the wound has quit draining you may leave it open to air.  Generally you should leave the bandage intact for twenty four hours unless there is drainage.  If the epidural site drains for more than 36-48 hours please call the anesthesia department.  QUESTIONS?:  Please feel free to call your physician or the hospital operator if you have any questions, and they will be happy to assist you.      Anestesia general, adultos, cuidados posteriores (General Anesthesia, Adult, Care After) Siga estas instrucciones durante las prximas semanas. Estas indicaciones le proporcionan informacin acerca de cmo deber cuidarse despus del procedimiento. El mdico tambin podr darle instrucciones ms especficas. El tratamiento se ha planificado de acuerdo a las prcticas mdicas actuales, pero a veces se producen problemas. Comunquese con el mdico si tiene algn problema o tiene dudas despus del procedimiento. QU ESPERAR DESPUS DEL PROCEDIMIENTO Despus del  procedimiento es habitual experimentar:  Somnolencia.  Nuseas y vmitos. INSTRUCCIONES PARA EL CUIDADO EN EL HOGAR  Durante las primeras 24 horas luego de la anestesia general:  Haga que una persona responsable se quede con usted.  No conduzca un automvil. Si est solo, no viaje en transporte pblico.  No beba alcohol.  No tome medicamentos que no le haya recetado su mdico.  No firme documentos importantes ni tome decisiones trascendentes.  Puede reanudar su dieta y sus actividades normales segn le haya indicado el mdico.  Cambie los vendajes (apsitos) tal como se le indic.  Si tiene preguntas o se le presenta algn problema relacionado con la anestesia general, comunquese con el hospital y pida por el anestesista o anestesilogo de [place]. SOLICITE ATENCIN MDICA SI:  Tiene nuseas y [place] posterior a la anestesia.  Le aparece una erupcin cutnea. SOLICITE ATENCIN MDICA DE INMEDIATO SI:   Tiene dificultad para respirar.  Siente dolor en el pecho.  Tiene algn problema alrgico.   Esta informacin no tiene como fin reemplazar el consejo del mdico. Asegrese de hacerle al mdico cualquier pregunta que tenga.   Document Released: 01/30/2005 Document Revised: 02/20/2014 Elsevier Interactive Patient Education [employer].

## 2015-08-30 NOTE — Anesthesia Postprocedure Evaluation (Signed)
Anesthesia Post Note  Patient: Danny Arnold  Procedure(s) Performed: Procedure(s) (LRB): UMBILICAL HERNIORRHAPHY WITH MESH (N/A)  Patient location during evaluation: PACU Anesthesia Type: General Level of consciousness: awake and oriented Pain management: pain level controlled Vital Signs Assessment: post-procedure vital signs reviewed and stable Respiratory status: spontaneous breathing and patient connected to face mask oxygen Cardiovascular status: stable Postop Assessment: no signs of nausea or vomiting Anesthetic complications: no    Last Vitals:  Filed Vitals:   08/30/15 0700 08/30/15 0715  BP:  123/85  Pulse:    Temp:    Resp: 19 19    Last Pain: There were no vitals filed for this visit.               Rashon Westrup A

## 2015-08-30 NOTE — Anesthesia Procedure Notes (Signed)
Procedure Name: Intubation Date/Time: 08/30/2015 7:36 AM Performed by: Andree Elk, AMY A Pre-anesthesia Checklist: Patient identified, Patient being monitored, Timeout performed, Emergency Drugs available and Suction available Patient Re-evaluated:Patient Re-evaluated prior to inductionOxygen Delivery Method: Circle system utilized Preoxygenation: Pre-oxygenation with 100% oxygen Intubation Type: IV induction Ventilation: Mask ventilation without difficulty Laryngoscope Size: 3 and Miller Grade View: Grade I Tube type: Oral Tube size: 7.0 mm Number of attempts: 1 Airway Equipment and Method: Stylet Placement Confirmation: ETT inserted through vocal cords under direct vision,  positive ETCO2 and breath sounds checked- equal and bilateral Secured at: 21 cm Tube secured with: Tape Dental Injury: Teeth and Oropharynx as per pre-operative assessment

## 2015-08-30 NOTE — Interval H&P Note (Signed)
History and Physical Interval Note:  08/30/2015 7:04 AM  Danny Arnold  has presented today for surgery, with the diagnosis of umbilical hernia  The various methods of treatment have been discussed with the patient and family. After consideration of risks, benefits and other options for treatment, the patient has consented to  Procedure(s) with comments: [place] (N/A) - Interpreter scheduled, do NOT move as a surgical intervention .  The patient's history has been reviewed, patient examined, no change in status, stable for surgery.  I have reviewed the patient's chart and labs.  Questions were answered to the patient's satisfaction.     Aviva Signs A

## 2015-08-30 NOTE — Transfer of Care (Signed)
Immediate Anesthesia Transfer of Care Note  Patient: Danny Arnold  Procedure(s) Performed: Procedure(s): UMBILICAL HERNIORRHAPHY WITH MESH (N/A)  Patient Location: PACU  Anesthesia Type:General  Level of Consciousness: awake, oriented and patient cooperative  Airway & Oxygen Therapy: Patient Spontanous Breathing and Patient connected to face mask oxygen  Post-op Assessment: Report given to RN and Post -op Vital signs reviewed and stable  Post vital signs: Reviewed and stable  Last Vitals:  Filed Vitals:   08/30/15 0700 08/30/15 0715  BP:  123/85  Pulse:    Temp:    Resp: 19 19    Last Pain: There were no vitals filed for this visit.       Complications: No apparent anesthesia complications

## 2015-08-30 NOTE — Progress Notes (Signed)
Interpreter with pt. Pt speaks no [place].

## 2015-08-30 NOTE — Anesthesia Preprocedure Evaluation (Signed)
Anesthesia Evaluation  Patient identified by MRN, date of birth, ID band Patient awake    Reviewed: Allergy & Precautions, NPO status , Patient's Chart, lab work & pertinent test results  Airway Mallampati: II  TM Distance: >3 FB     Dental  (+) Teeth Intact, Dental Advisory Given   Pulmonary former smoker,    breath sounds clear to auscultation       Cardiovascular negative cardio ROS   Rhythm:Regular Rate:Normal     Neuro/Psych    GI/Hepatic (+) Cirrhosis   Esophageal Varices and ascites  substance abuse (in remission)  alcohol use, Hepatitis -  Endo/Other    Renal/GU      Musculoskeletal   Abdominal   Peds  Hematology   Anesthesia Other Findings   Reproductive/Obstetrics                             Anesthesia Physical Anesthesia Plan  ASA: III  Anesthesia Plan: General   Post-op Pain Management:    Induction: Intravenous  Airway Management Planned: Oral ETT  Additional Equipment:   Intra-op Plan:   Post-operative Plan: Extubation in OR  Informed Consent: I have reviewed the patients History and Physical, chart, labs and discussed the procedure including the risks, benefits and alternatives for the proposed anesthesia with the patient or authorized representative who has indicated his/her understanding and acceptance.     Plan Discussed with:   Anesthesia Plan Comments:         Anesthesia Quick Evaluation

## 2015-08-30 NOTE — Op Note (Signed)
Patient:  Danny Arnold  DOB:  December 06, 1975  MRN:  [id number]   Preop Diagnosis:  Umbilical hernia  Postop Diagnosis:  Same  Procedure:  Umbilical herniorrhaphy with mesh  Surgeon:  Aviva Signs, M.D.  Anes:  Gen. endotracheal  Indications:  Patient is a 40 year old Hispanic male who presents with an umbilical hernia. The risks and benefits of procedure including bleeding, infection, mesh use, and a possibly recurrence of the hernia were fully explained to the patient, who gave informed consent.  Procedure note:  The patient was placed the supine position. After induction of general endotracheal anesthesia, the abdomen was prepped and draped using the usual sterile technique with DuraPrep. Surgical site confirmation was performed.   From umbilical incision was made down to the fascia. The umbilicus was freed away from the underlying hernia sac. The hernia sac was excised down to the fascia. The hernia defect measured approximately 1 cm in its greatest diameter. The underside of the anterior abdominal wall was digitally examined and no adhesions were noted. A 4.3 cm Bard ventralax ST patch was then inserted and secured to the fascia using 0 Ethibond interrupted sutures. The overlying fascia was reapproximated transversely using 0 Ethibond interrupted sutures. The umbilicus was secured back to the fascia using a 2-0 Vicryl interrupted suture. The subcutaneous layer was reapproximated using 3-0 Vicryl interrupted suture. 0.5% Sensorcaine was instilled into the surrounding wound. Skin was closed using staples. Betadine ointment dry sterile dressings were applied.  All tape and needle counts were correct at the end of the procedure. Patient was extubated in the operating room and transferred to PACU in stable condition.  Complications:  None  EBL:  Minimal  Specimen:  None

## 2015-08-31 ENCOUNTER — Telehealth: Payer: Self-pay | Admitting: Gastroenterology

## 2015-08-31 NOTE — Telephone Encounter (Addendum)
PLEASE CALL PT. HIS LIVER PANEL & BLOOD COUNT ARE NORMAL. HIS ULTRASOUND SHOWED CIRRHOSIS. WE WILL CHECK LABS & U/S EVERY 6 MOS.

## 2015-09-01 NOTE — Telephone Encounter (Signed)
Unable to reach pt by phone. Mailed a letter with the info and to call if questions.

## 2015-09-01 NOTE — Telephone Encounter (Signed)
Reminder in epic °

## 2015-09-03 ENCOUNTER — Encounter (HOSPITAL_COMMUNITY): Payer: Self-pay | Admitting: General Surgery

## 2015-09-27 ENCOUNTER — Other Ambulatory Visit (INDEPENDENT_AMBULATORY_CARE_PROVIDER_SITE_OTHER): Payer: Self-pay | Admitting: Otolaryngology

## 2015-09-27 ENCOUNTER — Ambulatory Visit (INDEPENDENT_AMBULATORY_CARE_PROVIDER_SITE_OTHER): Payer: Self-pay | Admitting: Otolaryngology

## 2015-09-27 DIAGNOSIS — R221 Localized swelling, mass and lump, neck: Secondary | ICD-10-CM

## 2015-09-27 DIAGNOSIS — D442 Neoplasm of uncertain behavior of parathyroid gland: Secondary | ICD-10-CM

## 2015-10-07 ENCOUNTER — Ambulatory Visit (HOSPITAL_COMMUNITY): Payer: Self-pay

## 2015-10-11 ENCOUNTER — Ambulatory Visit (HOSPITAL_COMMUNITY)
Admission: RE | Admit: 2015-10-11 | Discharge: 2015-10-11 | Disposition: A | Discharge status: Home / Self Care | Payer: Self-pay | Source: Ambulatory Visit | Attending: Otolaryngology | Admitting: Otolaryngology

## 2015-10-11 DIAGNOSIS — R221 Localized swelling, mass and lump, neck: Secondary | ICD-10-CM | POA: Insufficient documentation

## 2015-10-11 MED ORDER — IOPAMIDOL (ISOVUE-300) INJECTION 61%
75.0000 mL | Freq: Once | INTRAVENOUS | Status: AC | PRN
  Administered 2015-10-11: 75 mL via INTRAVENOUS

## 2015-11-10 ENCOUNTER — Ambulatory Visit: Payer: Self-pay | Attending: Internal Medicine

## 2015-12-22 ENCOUNTER — Telehealth: Payer: Self-pay | Admitting: Gastroenterology

## 2015-12-22 ENCOUNTER — Encounter: Payer: Self-pay | Admitting: Gastroenterology

## 2015-12-22 NOTE — Telephone Encounter (Signed)
Letter mailed

## 2015-12-22 NOTE — Telephone Encounter (Signed)
Recall for ultrasound 

## 2016-01-26 ENCOUNTER — Ambulatory Visit: Payer: Self-pay | Admitting: Nurse Practitioner

## 2016-02-23 ENCOUNTER — Ambulatory Visit: Payer: Self-pay | Admitting: Nurse Practitioner

## 2016-02-23 ENCOUNTER — Ambulatory Visit: Payer: Self-pay | Attending: Internal Medicine

## 2016-03-13 ENCOUNTER — Ambulatory Visit: Payer: Self-pay | Admitting: Nurse Practitioner

## 2016-04-04 ENCOUNTER — Ambulatory Visit (INDEPENDENT_AMBULATORY_CARE_PROVIDER_SITE_OTHER): Payer: Self-pay | Admitting: Nurse Practitioner

## 2016-04-04 ENCOUNTER — Other Ambulatory Visit: Payer: Self-pay

## 2016-04-04 ENCOUNTER — Encounter: Payer: Self-pay | Admitting: Nurse Practitioner

## 2016-04-04 VITALS — BP 135/93 | HR 79 | Temp 97.9°F | Ht 68.0 in | Wt 191.0 lb

## 2016-04-04 DIAGNOSIS — K7030 Alcoholic cirrhosis of liver without ascites: Secondary | ICD-10-CM

## 2016-04-04 DIAGNOSIS — I8510 Secondary esophageal varices without bleeding: Secondary | ICD-10-CM

## 2016-04-04 DIAGNOSIS — K429 Umbilical hernia without obstruction or gangrene: Secondary | ICD-10-CM

## 2016-04-04 NOTE — Assessment & Plan Note (Signed)
Grade 1 varices noted in 2014, overdue for surveillance. This had to be put off due to umbilical hernia surgical repair. He is ready to schedule at this time.  Proceed with EGD with possible esophageal variceal banding (if necessary) with Dr. Oneida Alar in near future: the risks, benefits, and alternatives have been discussed with the patient in detail. The patient states understanding and desires to proceed.  The patient is not on any medications. No drinking since 2014. Previous endoscopy completed on conscious sedation and this should be adequate for his procedure this time as well.

## 2016-04-04 NOTE — Patient Instructions (Addendum)
[place]   1. Have your labs drawn when you are able to. 2. We will help you schedule your ultrasound. 3. We will schedule your endoscopy or you. 4. Continue to not drink alcohol. Notify us of any new prescription medications. 5. Continue trying to lose weight to a goal weight of 165 pounds to 170 pounds. 6. We will refer you to a dietitian to help. 7. Return for follow-up in 6 months.  [place]  1. Haga examenes de laboratorios cuando pueda. 2. [place] Comment ayudaremos a programar su ultrasonido. 3. Programaremos su endoscopia o usted. 4. Contina sin beber alcohol. Notifquenos de cualquier medicamento recetado nuevo. 5. Contine tratando de perder peso a un peso objetivo de 165 libras a 170 libras. 6. Lo referiremos a un dietista para que lo ayude. 7. Regreso para seguimiento en 6 meses.   **Note: the instructions translated to Spanish were reviewed by the system translator who verified their accuracy**

## 2016-04-04 NOTE — Progress Notes (Addendum)
REVIEWED-NO ADDITIONAL RECOMMENDATIONS.  Referring Provider: [name], NP Primary Care Physician:  [name], NP Primary GI:  Dr. Oneida Alar  Chief Complaint  Patient presents with  . Cirrhosis    f/u, doing ok    HPI:   Danny Arnold is a 41 y.o. 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 The patient was last seen in our office 08/04/2015 for the same. At that time it was noted he completed a hepatitis B vaccine series and was feeling well. Is not going to have surgery on his hernia because it is difficult to get seen by surgery (per the patient). He was referred to surgery for hernia repair and removal of cyst on his face. Varices noted to be grade 1 in 2014 with recommended EGD October 2017 which does not appear to been completed. Overall well compensated disease, cirrhosis labs and imaging, follow-up in 6 months. CBC, CMP, INR, AFP all normal when drawn 08/12/2015. Ultrasound completed 08/16/2015 with cirrhosis, resolution of small amount of ascites, 2 small polyps in the gallbladder.  It appears he had surgical repair of his hernia. The facial cyst was unable to be removed due to swelling. It resolved on its own and "accidentally" popped, no recurrence.   Today he is accompanied by an Chief Executive Officer. Today he states he's doing well. Has surgical repair of his hernia and it feels good. Denies yellowing of skin/eyes, hematochezia, melena, abdominal pain, N/V, acute episodic confusion, darkened urine. Does have occasional dyspnea and wonders if this is from increased weight. He was recommended to lose weight to 165-170, thinks he has gained weight. He initially followed diet recommendations but then stopped.   Past Medical History:  Diagnosis Date  . Alcoholic hepatitis   . Alcoholic liver disease ([place])   . Anemia   . Ascites 02/03/2013  . Bacteremia     Past Surgical History:    Procedure Laterality Date  . COLONOSCOPY N/A 11/25/2012   IH  . ESOPHAGOGASTRODUODENOSCOPY N/A 11/25/2012   GRADE 1 VARICES  . HAND SURGERY    . HEMORRHOID BANDING  2014 x1  . UMBILICAL HERNIA REPAIR N/A 08/30/2015   Procedure: UMBILICAL HERNIORRHAPHY WITH MESH;  Surgeon: Aviva Signs, MD;  Location: [facility];  Service: General;  Laterality: N/A;    No current outpatient prescriptions on file.   No current facility-administered medications for this visit.     Allergies as of 04/04/2016  . (No Known Allergies)    Family History  Problem Relation Age of Onset  . Migraines Mother     Social History   Social History  . Marital status: Single    Spouse name: N/A  . Number of children: N/A  . Years of education: N/A   Social History Main Topics  . Smoking status: Former Research scientist (life sciences)  . Smokeless tobacco: Never Used     Comment: Quit smoking more than a year ago  . Alcohol use No     Comment: heavy etoh until 02/2012- none since 2014.  . Drug use: No  . Sexual activity: Not Asked   Other Topics Concern  . 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    Review of Systems: Complete ROS negative except as per HPI.   Physical Exam: BP (!) 135/93   Pulse 79   Temp 97.9 F (36.6 C) (Oral)   Ht 5\' 8"  (1.727  m)   Wt 191 lb (86.6 kg)   BMI 29.04 kg/m  General:   Alert and oriented. Pleasant and cooperative. Well-nourished and well-developed. Obese male. Eyes:  Without icterus, sclera clear and conjunctiva pink.  Ears:  Normal auditory acuity. Cardiovascular:  S1, S2 present without murmurs appreciated. Extremities without clubbing or edema. Respiratory:  Clear to auscultation bilaterally. No wheezes, rales, or rhonchi. No distress.  Gastrointestinal:  +BS, rounded but soft, non-tender and non-distended. No signs of ascites noted. No HSM noted. No guarding or rebound. No masses appreciated. No umbilical hernia noted. Rectal:  Deferred   Musculoskalatal:  Symmetrical without gross deformities. Skin:  Intact without significant lesions or rashes. Previous area of concern (facial cyst) barely appreciable at this time. Neurologic:  Alert and oriented x4;  grossly normal neurologically. Psych:  Alert and cooperative. Normal mood and affect. Heme/Lymph/Immune: No excessive bruising noted.    04/04/2016 9:41 AM   Disclaimer: This note was dictated with voice recognition software. Similar sounding words can inadvertently be transcribed and may not be corrected upon review.

## 2016-04-04 NOTE — Assessment & Plan Note (Signed)
Umbilical hernia is now status post surgical repair. The area looks good, no further hernia, no tenderness to palpation. Continue to monitor.

## 2016-04-04 NOTE — Assessment & Plan Note (Signed)
Continues to abstain from alcohol. Understands he should clear any prescription or over-the-counter medications with GI first to prevent hepatotoxic medications. His liver disease has been well compensated to now. I will recheck his labs including CBC, CMP, PTT/INR, AFP. I will also update his right upper quadrant ultrasound imaging. Recommend he continue to abstain from alcohol and hepatotoxic medications. Reinforced weight loss recommendations. I will refer him to a dietitian to help as he seems to have put on weight. Return for follow-up in 6 months.

## 2016-04-04 NOTE — Progress Notes (Signed)
CC'D TO PCP °

## 2016-04-05 ENCOUNTER — Other Ambulatory Visit: Payer: Self-pay

## 2016-04-05 DIAGNOSIS — Z789 Other specified health status: Secondary | ICD-10-CM

## 2016-04-14 ENCOUNTER — Ambulatory Visit (HOSPITAL_COMMUNITY)
Admission: RE | Admit: 2016-04-14 | Discharge: 2016-04-14 | Disposition: A | Discharge status: Home / Self Care | Payer: Self-pay | Source: Ambulatory Visit | Attending: Nurse Practitioner | Admitting: Nurse Practitioner

## 2016-04-14 ENCOUNTER — Other Ambulatory Visit (HOSPITAL_COMMUNITY)
Admission: RE | Admit: 2016-04-14 | Discharge: 2016-04-14 | Disposition: A | Discharge status: Home / Self Care | Payer: Self-pay | Source: Ambulatory Visit | Attending: Nurse Practitioner | Admitting: Nurse Practitioner

## 2016-04-14 DIAGNOSIS — K7030 Alcoholic cirrhosis of liver without ascites: Secondary | ICD-10-CM | POA: Insufficient documentation

## 2016-04-14 LAB — CBC WITH DIFFERENTIAL/PLATELET
BASOS ABS: 0 10*3/uL (ref 0.0–0.1)
Basophils Relative: 0 %
EOS ABS: 0.2 10*3/uL (ref 0.0–0.7)
EOS PCT: 2 %
HCT: 50.4 % (ref 39.0–52.0)
Hemoglobin: 17.8 g/dL — ABNORMAL HIGH (ref 13.0–17.0)
LYMPHS ABS: 3.1 10*3/uL (ref 0.7–4.0)
Lymphocytes Relative: 33 %
MCH: 30.5 pg (ref 26.0–34.0)
MCHC: 35.3 g/dL (ref 30.0–36.0)
MCV: 86.3 fL (ref 78.0–100.0)
Monocytes Absolute: 0.9 10*3/uL (ref 0.1–1.0)
Monocytes Relative: 10 %
Neutro Abs: 5.2 10*3/uL (ref 1.7–7.7)
Neutrophils Relative %: 55 %
PLATELETS: 216 10*3/uL (ref 150–400)
RBC: 5.84 MIL/uL — AB (ref 4.22–5.81)
RDW: 12.8 % (ref 11.5–15.5)
WBC: 9.4 10*3/uL (ref 4.0–10.5)

## 2016-04-14 LAB — COMPREHENSIVE METABOLIC PANEL
ALT: 43 U/L (ref 17–63)
AST: 31 U/L (ref 15–41)
Albumin: 4.2 g/dL (ref 3.5–5.0)
Alkaline Phosphatase: 67 U/L (ref 38–126)
Anion gap: 9 (ref 5–15)
BUN: 17 mg/dL (ref 6–20)
CHLORIDE: 103 mmol/L (ref 101–111)
CO2: 25 mmol/L (ref 22–32)
CREATININE: 0.96 mg/dL (ref 0.61–1.24)
Calcium: 9.6 mg/dL (ref 8.9–10.3)
GFR calc non Af Amer: >60 mL/min (ref >60)
Glucose, Bld: 111 mg/dL — ABNORMAL HIGH (ref 65–99)
POTASSIUM: 3.9 mmol/L (ref 3.5–5.1)
SODIUM: 137 mmol/L (ref 135–145)
Total Bilirubin: 0.9 mg/dL (ref 0.3–1.2)
Total Protein: 8.5 g/dL — ABNORMAL HIGH (ref 6.5–8.1)

## 2016-04-14 LAB — PROTIME-INR
INR: 1.03
Prothrombin Time: 13.5 seconds (ref 11.4–15.2)

## 2016-04-15 LAB — AFP TUMOR MARKER: AFP TUMOR MARKER: 1.9 ng/mL (ref 0.0–8.3)

## 2016-04-17 NOTE — Progress Notes (Signed)
LMOM to call.

## 2016-04-18 NOTE — Progress Notes (Signed)
Letter mailed with results. 

## 2016-04-19 ENCOUNTER — Telehealth: Payer: Self-pay

## 2016-04-19 NOTE — Telephone Encounter (Signed)
Called [phone] to f/u on referral for nutrition referral. Receptionist advised that they called pt yesterday and left a message.

## 2016-04-20 ENCOUNTER — Encounter (HOSPITAL_COMMUNITY)
Admission: RE | Disposition: A | Discharge status: Home / Self Care | Payer: Self-pay | Source: Ambulatory Visit | Attending: Gastroenterology

## 2016-04-20 ENCOUNTER — Encounter (HOSPITAL_COMMUNITY): Payer: Self-pay | Admitting: *Deleted

## 2016-04-20 ENCOUNTER — Ambulatory Visit (HOSPITAL_COMMUNITY)
Admission: RE | Admit: 2016-04-20 | Discharge: 2016-04-20 | Disposition: A | Discharge status: Home / Self Care | Payer: Self-pay | Source: Ambulatory Visit | Attending: Gastroenterology | Admitting: Gastroenterology

## 2016-04-20 DIAGNOSIS — K7030 Alcoholic cirrhosis of liver without ascites: Secondary | ICD-10-CM

## 2016-04-20 DIAGNOSIS — I8500 Esophageal varices without bleeding: Secondary | ICD-10-CM | POA: Insufficient documentation

## 2016-04-20 DIAGNOSIS — K2990 Gastroduodenitis, unspecified, without bleeding: Secondary | ICD-10-CM

## 2016-04-20 DIAGNOSIS — Z87891 Personal history of nicotine dependence: Secondary | ICD-10-CM | POA: Insufficient documentation

## 2016-04-20 DIAGNOSIS — I8510 Secondary esophageal varices without bleeding: Secondary | ICD-10-CM

## 2016-04-20 DIAGNOSIS — K2950 Unspecified chronic gastritis without bleeding: Secondary | ICD-10-CM | POA: Insufficient documentation

## 2016-04-20 DIAGNOSIS — K2980 Duodenitis without bleeding: Secondary | ICD-10-CM | POA: Insufficient documentation

## 2016-04-20 DIAGNOSIS — K2970 Gastritis, unspecified, without bleeding: Secondary | ICD-10-CM

## 2016-04-20 HISTORY — PX: ESOPHAGOGASTRODUODENOSCOPY: SHX5428

## 2016-04-20 SURGERY — EGD (ESOPHAGOGASTRODUODENOSCOPY)
Anesthesia: Moderate Sedation

## 2016-04-20 MED ORDER — SIMETHICONE 40 MG/0.6ML PO SUSP
ORAL | Status: DC | PRN
  Administered 2016-04-20: 09:00:00

## 2016-04-20 MED ORDER — SIMETHICONE 40 MG/0.6ML PO SUSP
ORAL | Status: AC
  Filled 2016-04-20: qty 30

## 2016-04-20 MED ORDER — MIDAZOLAM HCL 5 MG/5ML IJ SOLN
INTRAMUSCULAR | Status: AC
  Filled 2016-04-20: qty 10

## 2016-04-20 MED ORDER — MIDAZOLAM HCL 5 MG/5ML IJ SOLN
INTRAMUSCULAR | Status: DC | PRN
  Administered 2016-04-20 (×2): 2 mg via INTRAVENOUS

## 2016-04-20 MED ORDER — SODIUM CHLORIDE 0.9 % IV SOLN
INTRAVENOUS | Status: DC
  Administered 2016-04-20: 08:00:00 via INTRAVENOUS

## 2016-04-20 MED ORDER — MEPERIDINE HCL 100 MG/ML IJ SOLN
INTRAMUSCULAR | Status: AC
  Filled 2016-04-20: qty 2

## 2016-04-20 MED ORDER — LIDOCAINE VISCOUS 2 % MT SOLN
OROMUCOSAL | Status: DC | PRN
  Administered 2016-04-20: 1 via OROMUCOSAL

## 2016-04-20 MED ORDER — MEPERIDINE HCL 100 MG/ML IJ SOLN
INTRAMUSCULAR | Status: DC | PRN
Start: 2016-04-20 — End: 2016-04-20
  Administered 2016-04-20: 25 mg via INTRAVENOUS
  Administered 2016-04-20: 50 mg via INTRAVENOUS

## 2016-04-20 MED ORDER — LIDOCAINE VISCOUS 2 % MT SOLN
OROMUCOSAL | Status: AC
  Filled 2016-04-20: qty 15

## 2016-04-20 NOTE — OR Nursing (Signed)
Used [employer], [phone] for pre-op assessment.

## 2016-04-20 NOTE — Discharge Instructions (Signed)
Tienes varices pequenas y gastritis. Hice una biopsia de tu estomago.   U BIOPSIA REGRESARA EN 10-14 DIAS.  SEGUIMIENTO EN 6 MESES.    Esofagogastroduodenoscopia, cuidados posteriores (Esophagogastroduodenoscopy, Care After) Siga estas instrucciones durante las prximas semanas. Estas indicaciones le proporcionan informacin acerca de cmo deber cuidarse despus del procedimiento. El mdico tambin podr darle instrucciones ms especficas. El tratamiento ha sido planificado segn las prcticas mdicas actuales, pero en algunos casos pueden ocurrir problemas. Comunquese con el mdico si tiene algn problema o dudas despus del procedimiento. QU ESPERAR DESPUS DEL PROCEDIMIENTO Despus del procedimiento, es comn [employer] siguientes sntomas:  Dolor de Investment banker, operational.  Nuseas.  Meteorismo.  Mareos.  Fatiga. INSTRUCCIONES PARA EL CUIDADO EN EL HOGAR  No coma ni beba nada hasta que el efecto del anestsico (anestesia local) haya desaparecido y vuelva a tener reflejo farngeo. Si puede tragar con comodidad, significa que el efecto de la anestesia local ha desaparecido.  No conduzca durante 24horas si le dieron un medicamento para ayudarlo a que se relaje (sedante).  Si durante el procedimiento el mdico tom [place] de tejido para Personnel officer, asegrese de [employer] de la prueba. Esta es su responsabilidad. Consulte a su mdico o en el departamento donde se realice el estudio cundo estarn [employer].  Concurra a todas las visitas de control como se lo haya indicado el mdico. Esto es importante. SOLICITE ATENCIN MDICA SI:  No puede dejar de toser.  No puede orinar.  Orina menos que lo habitual. SOLICITE ATENCIN MDICA DE INMEDIATO SI:  Presenta dificultad para tragar.  No puede comer ni beber.  Tiene dolor de garganta o de pecho que [place].  Se siente mareado o sufre un desmayo.  Se desmaya.  Tiene nuseas o vmitos.  Tiene  escalofros.  Tiene fiebre.  Siente un dolor abdominal intenso.  La materia fecal es negra, de aspecto alquitranado o sanguinolenta. Esta informacin no tiene Marine scientist el consejo del mdico. Asegrese de hacerle al mdico cualquier pregunta que tenga. Document Released: 05/27/2012 Document Revised: 05/24/2015 Document Reviewed: 12/24/2014 Elsevier Interactive Patient Education  2017 Elsevier   Vrices esofgicas (Esophageal Varices)  El esfago es el pasaje que conecta la garganta con el [place]. Las vrices esofgicas son vasos sanguneos del esfago que se han agrandado. Aparecen cuando hay un exceso de sangre que circula por los vasos debido a que la va normal de la sangre se ha obstruido. Sin tratamiento, estos vasos sanguneos finalmente se rompen y Control and instrumentation engineer (hemorragia). La hemorragia es potencialmente mortal. CAUSAS Esta afeccin puede ser causada por lo siguiente:  Cicatrices en el hgado (cirrosis) debido a alcoholismo. Esta es la causa ms frecuente.  Enfermedad heptica.  Insuficiencia cardaca grave.  Un cogulo sanguneo en la vena porta.  Sarcoidosis. Esta es una enfermedad inflamatoria que puede afectar el hgado.  Esquistosomosis. Esta es una infeccin por parsitos que puede causar daos en el hgado. SNTOMAS Por lo general, no hay sntomas, a menos que las vrices esofgicas sangren. Entre los sntomas de vrices esofgicas sangrantes, se incluyen los siguientes:  Vomito de una sustancia roja brillante o de color negro y con aspecto similar a los granos del caf.  Tos con sangre.  Heces de color negro, de aspecto alquitranado.  Mareos o sensacin de desvanecimiento.  Presin arterial baja.  Prdida de la conciencia. DIAGNSTICO Esta afeccin se diagnostica mediante estudios, por ejemplo, los siguientes:  Endoscopia. [employer], se introduce en la boca un tubo delgado y con Hali Marry  hasta llegar al esfago.  Anlisis de [place].  Estos pueden verificar la funcin del hgado, los recuentos de sangre y la capacidad del cuerpo de formar cogulos sanguneos. TRATAMIENTO El tratamiento de esta afeccin puede incluir lo siguiente:  Medicamentos que reducen la presin de las vrices esofgicas y el riesgo de Social research officer, government.  Procedimientos que reducen la presin de las vrices esofgicas y el riesgo de Social research officer, government, o que detienen la hemorragia. Estos incluyen los siguientes: ? Ligadura de vrices. En este procedimiento, se coloca una banda de goma alrededor de las vrices esofgicas para evitar que sangren. ? Tratamiento con una inyeccin. En este tratamiento, se aplica una inyeccin que hace que las vrices esofgicas se encojan y se cierren (escleroterapia). Tambin pueden utilizarse medicamentos que reducen el tamao de los vasos sanguneos o modifican el flujo sanguneo. ? Taponamiento con un baln. En este procedimiento, se introduce un baln en el esfago a travs de un tubo y se infla. ? Colocacin de una derivacin portosistmica intraheptica transyugular (DPIT). En este procedimiento, se introduce un pequeo tubo en las venas del hgado. Esto disminuye el flujo sanguneo y la presin en las vrices esofgicas.  Un trasplante de hgado. Esto se puede realizar si otros tratamientos no funcionan. Curtice los medicamentos solamente como se lo haya indicado el [place] indicaciones del mdico con respecto al reposo y la actividad fsica. SOLICITE ATENCIN MDICA DE INMEDIATO SI:  Tiene cualquier sntoma de esta afeccin despus del tratamiento.  No puede comer ni beber normalmente.  Siente dolor en el pecho. Esta informacin no tiene Marine scientist el consejo del mdico. Asegrese de hacerle al mdico cualquier pregunta que tenga. Document Released: 05/18/2008 Document Revised: 06/16/2014 Document Reviewed: 01/26/2014 Elsevier Interactive Patient Education  2017  [employer].

## 2016-04-20 NOTE — H&P (Signed)
  Primary Care Physician:  [name], NP Primary Gastroenterologist:  Dr. Oneida Alar  Pre-Procedure History & Physical: HPI:  Danny Arnold is a 41 y.o. male here for [place].  Past Medical History:  Diagnosis Date  . Alcoholic hepatitis   . Alcoholic liver disease ([place])   . Anemia   . Ascites 02/03/2013  . Bacteremia     Past Surgical History:  Procedure Laterality Date  . COLONOSCOPY N/A 11/25/2012   IH  . ESOPHAGOGASTRODUODENOSCOPY N/A 11/25/2012   GRADE 1 VARICES  . HAND SURGERY    . HEMORRHOID BANDING  2014 x1  . UMBILICAL HERNIA REPAIR N/A 08/30/2015   Procedure: UMBILICAL HERNIORRHAPHY WITH MESH;  Surgeon: Aviva Signs, MD;  Location: [facility];  Service: General;  Laterality: N/A;    Prior to Admission medications   Not on File    Allergies as of 04/04/2016  . (No Known Allergies)    Family History  Problem Relation Age of Onset  . Migraines Mother     Social History   Social History  . Marital status: Single    Spouse name: N/A  . Number of children: N/A  . Years of education: N/A   Occupational History  . Not on file.   Social History Main Topics  . Smoking status: Former Research scientist (life sciences)  . Smokeless tobacco: Never Used     Comment: Quit smoking more than a year ago  . Alcohol use No     Comment: heavy etoh until 02/2012- none since 2014.  . Drug use: No  . Sexual activity: Not on file   Other Topics Concern  . 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    Review of Systems: See HPI, otherwise negative ROS   Physical Exam: BP 127/87   Pulse 83   Temp 98.1 F (36.7 C) (Oral)   Resp 18   Ht 5\' 8"  (1.727 m)   Wt 191 lb (86.6 kg)   SpO2 97%   BMI 29.04 kg/m  General:   Alert,  pleasant and cooperative in NAD Head:  Normocephalic and atraumatic. Neck:  Supple; Lungs:  Clear throughout to auscultation.    Heart:  Regular rate and rhythm. Abdomen:  Soft, nontender and  nondistended. Normal bowel sounds, without guarding, and without rebound.   Neurologic:  Alert and  oriented x4;  grossly normal neurologically.  Impression/Plan:     SCREENING VARICES  PLAN:  1.EGD TODAY. DISCUSSED PROCEDURE, BENEFITS, & RISKS: < 1% chance of medication reaction, bleeding, or perforation.

## 2016-04-20 NOTE — Op Note (Signed)
[facility] Patient Name: Danny Arnold Procedure Date: 04/20/2016 8:33 AM MRN: [id number] Date of Birth: 06-05-1975 Attending MD: Barney Drain , MD CSN: [id number] Age: 41 Admit Type: Outpatient Procedure:                Upper GI endoscopy WITH COLD FORCEPS BIOPSY Indications:              Follow-up of esophageal varices Providers:                Barney Drain, MD, Janeece Riggers, RN, Purcell Nails Herndon,                            Merchant navy officer Referring MD:              Medicines:                Meperidine 75 mg IV, Midazolam 4 mg IV Complications:            No immediate complications. Estimated Blood Loss:     Estimated blood loss was minimal. Procedure:                Pre-Anesthesia Assessment:                           - Prior to the procedure, a History and Physical                            was performed, and patient medications and                            allergies were reviewed. The patient's tolerance of                            previous anesthesia was also reviewed. The risks                            and benefits of the procedure and the sedation                            options and risks were discussed with the patient.                            All questions were answered, and informed consent                            was obtained. Prior Anticoagulants: The patient has                            taken no previous anticoagulant or antiplatelet                            agents. ASA Grade Assessment: I - A normal, healthy                            patient. After reviewing the risks and benefits,  the patient was deemed in satisfactory condition to                            undergo the procedure. After obtaining informed                            consent, the endoscope was passed under direct                            vision. Throughout the procedure, the patient's                            blood pressure, pulse, and oxygen  saturations were                            monitored continuously. The EG-[id number] ([id number])                            scope was introduced through the mouth, and                            advanced to the second part of duodenum. The upper                            GI endoscopy was accomplished without difficulty.                            The patient tolerated the procedure well. Scope In: 8:58:30 AM Scope Out: 9:04:12 AM Total Procedure Duration: 0 hours 5 minutes 42 seconds  Findings:      Grade I varices were found in the lower third of the esophagus.      Patchy mild inflammation characterized by congestion (edema) and       erythema was found in the gastric antrum. Biopsies were taken with a       cold forceps for Helicobacter pylori testing.      Patchy mild inflammation characterized by congestion (edema) and       erythema was found in the duodenal bulb.      The second portion of the duodenum was normal. Impression:               - Grade I esophageal varices.                           - MILD Gastritis/DUODENITIS Moderate Sedation:      Moderate (conscious) sedation was administered by the endoscopy nurse       and supervised by the endoscopist. The following parameters were       monitored: oxygen saturation, heart rate, blood pressure, and response       to care. Total physician intraservice time was 20 minutes. Recommendation:           - Resume previous diet.                           - Continue present medications.                           -  Await pathology results. CONSIDER PPI.                           - Return to my office in 6 months.                           - Patient has a contact number available for                            emergencies. The signs and symptoms of potential                            delayed complications were discussed with the                            patient. Return to normal activities tomorrow.                            Written  discharge instructions were provided to the                            patient. Procedure Code(s):        --- Professional ---                           [phone], Esophagogastroduodenoscopy, flexible,                            transoral; with biopsy, single or multiple                           99152, Moderate sedation services provided by the                            same physician or other qualified health care                            professional performing the diagnostic or                            therapeutic service that the sedation supports,                            requiring the presence of an independent trained                            observer to assist in the monitoring of the                            patient's level of consciousness and physiological                            status; initial 15 minutes of intraservice time,  patient age 82 years or older Diagnosis Code(s):        --- Professional ---                           I85.00, Esophageal varices without bleeding                           K29.70, Gastritis, unspecified, without bleeding                           K29.80, Duodenitis without bleeding CPT copyright 2016 American Medical Association. All rights reserved. The codes documented in this report are preliminary and upon coder review may  be revised to meet current compliance requirements. Barney Drain, MD Barney Drain, MD 04/20/2016 9:31:51 AM This report has been signed electronically. Number of Addenda: 0

## 2016-04-20 NOTE — OR Nursing (Signed)
Zella Ball interpretor #[id number] used to explain risks and benefits of procedure per Dr. Oneida Alar

## 2016-04-25 ENCOUNTER — Encounter (HOSPITAL_COMMUNITY): Payer: Self-pay | Admitting: Gastroenterology

## 2016-05-01 NOTE — Telephone Encounter (Signed)
Called pt. He has not scheduled appt with nutritionist. Gave pt phone number to call [phone]).

## 2016-05-01 NOTE — Telephone Encounter (Signed)
According to Epic, pt has nutrition appt 05/09/16 at 2:30pm.

## 2016-05-09 ENCOUNTER — Encounter: Payer: Self-pay | Admitting: Dietician

## 2016-05-09 ENCOUNTER — Encounter: Payer: Self-pay | Attending: Nurse Practitioner | Admitting: Dietician

## 2016-05-09 DIAGNOSIS — E663 Overweight: Secondary | ICD-10-CM | POA: Insufficient documentation

## 2016-05-09 DIAGNOSIS — Z713 Dietary counseling and surveillance: Secondary | ICD-10-CM | POA: Insufficient documentation

## 2016-05-09 NOTE — Patient Instructions (Signed)
Bake, broil or grill rather than fried. More vegetables. Continue to avoid salt. Continue to watch portion sizes. Stop eating when satisfied. Continue to avoid alcohol. Be more active.  Aim for 1 hour of walking most days or other exercise as approved by your doctor.

## 2016-05-09 NOTE — Progress Notes (Signed)
  Medical Nutrition Therapy:  Appt start time: 0388 end time:  8280.   Assessment:  Primary concerns today: Patient is here today with his girlfriend Caryl Pina and his 39 yo son.  He has been referred for weight loss to goal of 165-170 lbs.  He has had a recent hospital admission for ETOH liver disease.  He sates that he wants to lose weight to the goal weight for his liver.  Caryl Pina is interpreting and they have signed the interpretor waiver.  Patient speaks Spanish.  Weight today is 195 lbs.  Patient lives with Orma Flaming (a friend).  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  They get home late and eat out most often.  Preferred Learning Style:   No preference indicated   Learning Readiness:   Ready  MEDICATIONS: none   DIETARY INTAKE:  Usual eating pattern includes 3 meals and 1 snacks per day. Everyday foods include eating out.  Avoided foods include alcohol, added salt.    24-hr recall:  B ( AM): 3 corn tortillas with eggs Snk ( AM):apple and banana and Naked Juice. L ( PM): slice of pizza and salad or boiled shrimp, veges, rice (chinese) Snk ( PM): none D ( PM): meat or fried shrimp and rice (chinese) or tacos or quesadila or  Snk ( PM): none Beverages: water, Naked Juice, or carrot juice or grape juice  Usual physical activity: Umbilical Hernia surgery 4 months ago  Estimated energy needs: 1600 calories 180 g carbohydrates 120 g protein 44 g fat  Progress Towards Goal(s):  In progress.   Nutritional Diagnosis:  NB-1.1 Food and nutrition-related knowledge deficit As related to weight loss.  As evidenced by patient report.    Intervention:  Nutrition counseling/education regarding healthy eating for weight loss.  Patient has already made changes by choosing less rich food and fried less often and watching portion sizes more.  Discussed sources of protein and recommendations as well as continued recommendations to avoid added salt.   Portion sizes also discussed.  Discussed need for increased activity when approved by MD.  Leslee Home, broil or grill rather than fried. More vegetables. Continue to avoid salt. Continue to watch portion sizes. Stop eating when satisfied. Continue to avoid alcohol. Be more active.  Aim for 1 hour of walking most days or other exercise as approved by your doctor.  Teaching Method Utilized:  Visual Auditory  Handouts given during visit include:  Spanish meal plan card  Spanish my plate  Barriers to learning/adherence to lifestyle change: Language barrier, eating out, possible activity restrictions.  Demonstrated degree of understanding via:  Teach Back   Monitoring/Evaluation:  Dietary intake, exercise, and body weight prn.

## 2016-05-15 ENCOUNTER — Telehealth: Payer: Self-pay | Admitting: Gastroenterology

## 2016-05-15 NOTE — Telephone Encounter (Signed)
Please call pt. His stomach Bx shows mild gastritis. HIS U/S SHOWS CHANGES OF CIRRHOSIS. HIS LIVER TEST AND BLOOD COUNT ARE NORMAL. OPV IN OCT 2018 E30 GASTRITIS/ETOHIC CIRRHOSIS. NO NEED FOR REPEAT EGD.

## 2016-05-16 NOTE — Telephone Encounter (Signed)
Pt is aware.  

## 2016-05-16 NOTE — Telephone Encounter (Signed)
Reminder in epic °

## 2016-05-17 ENCOUNTER — Ambulatory Visit: Payer: Self-pay | Attending: Internal Medicine

## 2016-05-29 ENCOUNTER — Ambulatory Visit: Payer: Self-pay | Attending: Family Medicine | Admitting: Family Medicine

## 2016-05-29 VITALS — BP 116/72 | HR 72 | Temp 98.1°F | Resp 18 | Ht 68.0 in | Wt 186.4 lb

## 2016-05-29 DIAGNOSIS — K047 Periapical abscess without sinus: Secondary | ICD-10-CM | POA: Insufficient documentation

## 2016-05-29 DIAGNOSIS — K029 Dental caries, unspecified: Secondary | ICD-10-CM | POA: Insufficient documentation

## 2016-05-29 DIAGNOSIS — Z23 Encounter for immunization: Secondary | ICD-10-CM | POA: Insufficient documentation

## 2016-05-29 DIAGNOSIS — Z0000 Encounter for general adult medical examination without abnormal findings: Secondary | ICD-10-CM

## 2016-05-29 MED ORDER — BENZOCAINE 10 % MT GEL: 1.0000 "application " | OROMUCOSAL | 0 refills | Status: DC | PRN

## 2016-05-29 MED ORDER — TRAMADOL HCL 50 MG PO TABS: 50.0000 mg | ORAL_TABLET | Freq: Two times a day (BID) | ORAL | 0 refills | Status: DC | PRN

## 2016-05-29 MED ORDER — PENICILLIN V POTASSIUM 250 MG PO TABS: 250.0000 mg | ORAL_TABLET | Freq: Four times a day (QID) | ORAL | 0 refills | Status: DC

## 2016-05-29 NOTE — Progress Notes (Signed)
Subjective:  Patient ID: Danny Arnold, male    DOB: 01-28-1976  Age: 41 y.o. MRN: [id number]  CC: Establish Care   HPI Kouper Stark Jock presents for   Dental problems: History of dental caries, decay,  and dental pain for 3 years. Reports symptoms worsened 1 year ago. Denies any facial swelling or drainage. He does report intermittent swelling along the gumline. Denies currently taking any medication.      No outpatient prescriptions prior to visit.   No facility-administered medications prior to visit.     ROS Review of Systems  Constitutional: Negative.   HENT: Positive for dental problem.   Respiratory: Negative.   Cardiovascular: Negative.   Gastrointestinal: Negative.   Skin: Negative.     Objective:  BP 116/72 (BP Location: Left Arm, Patient Position: Sitting, Cuff Size: Normal)   Pulse 72   Temp 98.1 F (36.7 C) (Oral)   Resp 18   Ht 5\' 8"  (1.727 m)   Wt 186 lb 6.4 oz (84.6 kg)   SpO2 97%   BMI 28.34 kg/m   BP/Weight 05/29/2016 7/67/2094 7/0/9628  Systolic BP 366 - 294  Diastolic BP 72 - 84  Wt. (Lbs) 186.4 195 191  BMI 28.34 29.65 29.04     Physical Exam  Constitutional: He appears well-developed and well-nourished.  HENT:  Head: Normocephalic.  Right Ear: External ear normal.  Left Ear: External ear normal.  Nose: Nose normal.  Mouth/Throat: Oropharynx is clear and moist. Dental caries present.  Neck: Normal range of motion.  Cardiovascular: Normal rate, regular rhythm, normal heart sounds and intact distal pulses.   Pulmonary/Chest: Effort normal and breath sounds normal.  Abdominal: Soft. Bowel sounds are normal.  Lymphadenopathy:    He has no cervical adenopathy.  Skin: Skin is warm and dry.  Nursing note and vitals reviewed.   Assessment & Plan:   Problem List Items Addressed This Visit    None    Visit Diagnoses    Dental abscess    -  Primary   Relevant Medications   traMADol (ULTRAM) 50 MG tablet   penicillin v  potassium (VEETID) 250 MG tablet   Other Relevant Orders   Ambulatory referral to Dentistry   Dental caries       Relevant Medications   traMADol (ULTRAM) 50 MG tablet   benzocaine (ORAJEL) 10 % mucosal gel   Other Relevant Orders   Ambulatory referral to Dentistry   Dental decay       Relevant Orders   Ambulatory referral to Dentistry   Healthcare maintenance       Relevant Orders   Tdap vaccine greater than or equal to 7yo IM (Completed)      Meds ordered this encounter  Medications  . traMADol (ULTRAM) 50 MG tablet    Sig: Take 1 tablet (50 mg total) by mouth every 12 (twelve) hours as needed for severe pain.    Dispense:  14 tablet    Refill:  0    Order Specific Question:   Supervising Provider    Answer:   Tresa Garter [id number]  . benzocaine (ORAJEL) 10 % mucosal gel    Sig: Use as directed 1 application in the mouth or throat as needed for mouth pain.    Dispense:  5.3 g    Refill:  0    Order Specific Question:   Supervising Provider    Answer:   Tresa Garter [id number]  . penicillin v potassium (VEETID) 250 MG tablet  Sig: Take 1 tablet (250 mg total) by mouth every 6 (six) hours.    Dispense:  20 tablet    Refill:  0    Order Specific Question:   Supervising Provider    Answer:   Tresa Garter [id number]    Follow-up: Return As needed.   Alfonse Spruce FNP

## 2016-05-29 NOTE — Progress Notes (Signed)
Patient is here for dental referral  Patient stated that 3 years ago he was in the ED & with the medication he was taking long time ago it was messing up his teeth so he needed e dental appt  But they never give it to him  He stated that his teeth are loose it even fell 1  Patient denies pain for today  Patient is not taking any current medication at all

## 2016-05-29 NOTE — Patient Instructions (Signed)
Absceso dental (Dental Abscess) Un absceso dental es pus en una pieza dental o alrededor de esta. CUIDADOS EN EL HOGAR  Tome los medicamentos solamente como se lo haya indicado el dentista.  Si le recetaron antibiticos, asegrese de terminarlos, incluso si comienza a sentirse mejor.  Enjuguese la boca (haga grgaras) frecuentemente con agua con sal.  No conduzca ni use maquinaria pesada, como una cortadora de csped, mientras est tomando analgsicos.  No se aplique calor en la parte externa de la boca.  Concurra a todas las visitas de control como se lo haya indicado el dentista. Esto es importante. SOLICITE AYUDA SI:  El dolor empeora, y los medicamentos no surten efecto. SOLICITE AYUDA DE INMEDIATO SI:  Tiene fiebre o siente escalofros.  Los sntomas empeoran repentinamente.  Siente un dolor de cabeza muy intenso.  Tiene problemas para respirar o tragar.  Tiene dificultad para abrir la boca.  Tiene inflamacin (hinchazn) en el cuello o alrededor del ojo. Esta informacin no tiene como fin reemplazar el consejo del mdico. Asegrese de hacerle al mdico cualquier pregunta que tenga. Document Released: 06/16/2014 Document Revised: 06/16/2014 Document Reviewed: 01/27/2014 Elsevier Interactive Patient Education  2017 Elsevier Inc.  

## 2016-08-14 ENCOUNTER — Ambulatory Visit: Payer: Self-pay | Attending: Family Medicine

## 2016-10-02 ENCOUNTER — Ambulatory Visit: Payer: Self-pay | Admitting: Nurse Practitioner

## 2016-11-21 ENCOUNTER — Ambulatory Visit: Payer: Self-pay | Admitting: Nurse Practitioner

## 2016-11-22 ENCOUNTER — Ambulatory Visit: Payer: Self-pay | Attending: Family Medicine

## 2016-12-14 ENCOUNTER — Encounter: Payer: Self-pay | Admitting: Nurse Practitioner

## 2016-12-14 ENCOUNTER — Encounter: Payer: Self-pay | Admitting: *Deleted

## 2016-12-14 ENCOUNTER — Encounter: Payer: Self-pay | Admitting: Gastroenterology

## 2016-12-14 ENCOUNTER — Ambulatory Visit (INDEPENDENT_AMBULATORY_CARE_PROVIDER_SITE_OTHER): Payer: Self-pay | Admitting: Nurse Practitioner

## 2016-12-14 ENCOUNTER — Other Ambulatory Visit: Payer: Self-pay | Admitting: *Deleted

## 2016-12-14 VITALS — BP 121/82 | HR 82 | Temp 97.8°F | Ht 68.0 in | Wt 186.8 lb

## 2016-12-14 DIAGNOSIS — I8510 Secondary esophageal varices without bleeding: Secondary | ICD-10-CM

## 2016-12-14 DIAGNOSIS — K7030 Alcoholic cirrhosis of liver without ascites: Secondary | ICD-10-CM

## 2016-12-14 NOTE — Progress Notes (Signed)
Referring Provider: [name], NP Primary Care Physician:  Alfonse Spruce, FNP Primary GI:  Dr. Oneida Alar  Chief Complaint  Patient presents with  . Cirrhosis    f/u doing okay  . Esophageal Varices    no complaints    HPI:   Danny Arnold is a 41 y.o. male who presents for follow-up on alcoholic cirrhosis and esophageal varices. He has completed hepatitis B series. Grade 1 esophageal varices noted in 2014. Overall well compensated disease. He had surgical repair of a umbilical hernia. At his last visit he was accompanied by a translator and noted to be doing well. Denied overt hepatic symptoms. Recommended weight loss with a goal weight of 165 pounds to 170 pounds. He initially was following diet recommendations but then stopped and feels he has gained weight. Recommended up-to-date EGD with possible variceal banding. Has not drank since 2014. Updated labs and imaging including CBC, CMP, PT/INR, AFP as well as right upper quadrant ultrasound.  Labs completed 04/14/2016 which found essentially normal CBC, normal CMP, normal INR, normal AFP. Calculated meld score of 7 and child Pugh class A.  Right upper quadrant ultrasound for hepatoma screening found increased liver echogenicity, overall stable appearance, no focal abnormality noted.  EGD completed 04/20/2016 which found grade 1 varices in the lower third of the esophagus, patchy mild inflammation in the gastric antrum status post biopsy, patchy mild inflammation in the duodenum. Deemed likely mild gastritis/duodenitis. Recommend continue current medications. Surgical pathology of the biopsies found benign gastric mucosa. Recommended no further EGD at this time.  Today he states he's doing well. Denies abdominal pain, N/V, hematochezia, melena, yellowing of skin or eyes, darkened urine, acute episodic confusion, tremors/shakes. Has had 1-2 episodes of dizziness when he walks a lot. Denies chest pain, dyspnea, syncope, near  syncope. Denies any other upper or lower GI symptoms.  Past Medical History:  Diagnosis Date  . Alcoholic hepatitis   . Alcoholic liver disease ([place])   . Anemia   . Ascites 02/03/2013  . Bacteremia     Past Surgical History:  Procedure Laterality Date  . COLONOSCOPY N/A 11/25/2012   IH  . ESOPHAGOGASTRODUODENOSCOPY N/A 11/25/2012   GRADE 1 VARICES  . ESOPHAGOGASTRODUODENOSCOPY N/A 04/20/2016   Procedure: ESOPHAGOGASTRODUODENOSCOPY (EGD);  Surgeon: Danie Binder, MD;  Location: [facility];  Service: Endoscopy;  Laterality: N/A;  8:30am  . HAND SURGERY    . HEMORRHOID BANDING  2014 x1  . UMBILICAL HERNIA REPAIR N/A 08/30/2015   Procedure: UMBILICAL HERNIORRHAPHY WITH MESH;  Surgeon: Aviva Signs, MD;  Location: [facility];  Service: General;  Laterality: N/A;    Current Outpatient Prescriptions  Medication Sig Dispense Refill  . benzocaine (ORAJEL) 10 % mucosal gel Use as directed 1 application in the mouth or throat as needed for mouth pain. (Patient not taking: Reported on 12/14/2016) 5.3 g 0  . penicillin v potassium (VEETID) 250 MG tablet Take 1 tablet (250 mg total) by mouth every 6 (six) hours. (Patient not taking: Reported on 12/14/2016) 20 tablet 0  . traMADol (ULTRAM) 50 MG tablet Take 1 tablet (50 mg total) by mouth every 12 (twelve) hours as needed for severe pain. (Patient not taking: Reported on 12/14/2016) 14 tablet 0   No current facility-administered medications for this visit.     Allergies as of 12/14/2016  . (No Known Allergies)    Family History  Problem Relation Age of Onset  . Migraines Mother   . Colon cancer Neg  Hx     Social History   Social History  . Marital status: Single    Spouse name: N/A  . Number of children: N/A  . Years of education: N/A   Social History Main Topics  . Smoking status: Former Research scientist (life sciences)  . Smokeless tobacco: Never Used     Comment: Quit smoking more than a year ago  . Alcohol use No     Comment: heavy etoh until 02/2012-  none since 2014.  . Drug use: No  . Sexual activity: Not Asked   Other Topics Concern  . 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    Review of Systems: General: Negative for anorexia, weight loss, fever, chills, fatigue, weakness. Eyes: Negative for vision changes.  ENT: Negative for hoarseness, difficulty swallowing , nasal congestion. CV: Negative for chest pain, angina, palpitations, dyspnea on exertion, peripheral edema.  Respiratory: Negative for dyspnea at rest, dyspnea on exertion, cough, sputum, wheezing.  GI: See history of present illness. GU:  Negative for dysuria, hematuria, urinary incontinence, urinary frequency, nocturnal urination.  MS: Negative for joint pain, low back pain.  Derm: Negative for rash or itching.  Neuro: Negative for weakness, abnormal sensation, seizure, frequent headaches, memory loss, confusion.  Psych: Negative for anxiety, depression, suicidal ideation, hallucinations.  Endo: Negative for unusual weight change.  Heme: Negative for bruising or bleeding. Allergy: Negative for rash or hives.   Physical Exam: BP 121/82   Pulse 82   Temp 97.8 F (36.6 C) (Oral)   Ht 5\' 8"  (1.727 m)   Wt 186 lb 12.8 oz (84.7 kg)   BMI 28.40 kg/m  General:   Alert and oriented. Pleasant and cooperative. Well-nourished and well-developed.  Head:  Normocephalic and atraumatic. Eyes:  Without icterus, sclera clear and conjunctiva pink.  Ears:  Normal auditory acuity. Mouth:  No deformity or lesions, oral mucosa pink.  Throat/Neck:  Supple, without mass or thyromegaly. Cardiovascular:  S1, S2 present without murmurs appreciated. Normal pulses noted. Extremities without clubbing or edema. Respiratory:  Clear to auscultation bilaterally. No wheezes, rales, or rhonchi. No distress.  Gastrointestinal:  +BS, soft, non-tender and non-distended. No HSM noted. No guarding or rebound. No masses appreciated.  Rectal:  Deferred    Musculoskalatal:  Symmetrical without gross deformities. Normal posture. Skin:  Intact without significant lesions or rashes. Neurologic:  Alert and oriented x4;  grossly normal neurologically. Psych:  Alert and cooperative. Normal mood and affect. Heme/Lymph/Immune: No significant cervical adenopathy. No excessive bruising noted.    12/14/2016 11:28 AM   Disclaimer: This note was dictated with voice recognition software. Similar sounding words can inadvertently be transcribed and may not be corrected upon review.

## 2016-12-14 NOTE — Patient Instructions (Signed)
Letter in patient chart dated for 12/14/16 regarding clenpiq prep was placed in wrong patient chart. This was never mailed to the patient.

## 2016-12-14 NOTE — Patient Instructions (Signed)
English: 1. Continue to avoid alcohol. 2. Have your labs done when you are able to. 3. We will help schedule your ultrasound of your liver. 4. Turn for follow-up in 6 months. 5. Call us if you have any questions or concerns.    Spanish: 1. Continuar evitando el alcohol. 2. Hgase el trabajo de laboratorio cuando pueda. 3. Le ayudaremos a programar su ultrasonido de su hgado. 4. Gire para seguimiento en 6 meses. 5. Llmenos si tiene alguna pregunta o inquietud.

## 2016-12-18 ENCOUNTER — Other Ambulatory Visit (HOSPITAL_COMMUNITY)
Admission: RE | Admit: 2016-12-18 | Discharge: 2016-12-18 | Disposition: A | Discharge status: Home / Self Care | Payer: Self-pay | Source: Ambulatory Visit | Attending: Nurse Practitioner | Admitting: Nurse Practitioner

## 2016-12-18 DIAGNOSIS — K7030 Alcoholic cirrhosis of liver without ascites: Secondary | ICD-10-CM | POA: Insufficient documentation

## 2016-12-18 LAB — COMPREHENSIVE METABOLIC PANEL
ALT: 35 U/L (ref 17–63)
ANION GAP: 18 — AB (ref 5–15)
AST: 24 U/L (ref 15–41)
Albumin: 4 g/dL (ref 3.5–5.0)
Alkaline Phosphatase: 65 U/L (ref 38–126)
BUN: 16 mg/dL (ref 6–20)
CALCIUM: 9.9 mg/dL (ref 8.9–10.3)
CHLORIDE: 99 mmol/L — AB (ref 101–111)
CO2: 24 mmol/L (ref 22–32)
CREATININE: 0.97 mg/dL (ref 0.61–1.24)
Glucose, Bld: 113 mg/dL — ABNORMAL HIGH (ref 65–99)
POTASSIUM: 3.9 mmol/L (ref 3.5–5.1)
Sodium: 141 mmol/L (ref 135–145)
TOTAL PROTEIN: 7.8 g/dL (ref 6.5–8.1)
Total Bilirubin: 0.8 mg/dL (ref 0.3–1.2)

## 2016-12-18 LAB — CBC WITH DIFFERENTIAL/PLATELET
BASOS PCT: 0 %
Basophils Absolute: 0 10*3/uL (ref 0.0–0.1)
EOS ABS: 0.2 10*3/uL (ref 0.0–0.7)
Eosinophils Relative: 2 %
HCT: 49.1 % (ref 39.0–52.0)
HEMOGLOBIN: 16.9 g/dL (ref 13.0–17.0)
Lymphocytes Relative: 29 %
Lymphs Abs: 2.6 10*3/uL (ref 0.7–4.0)
MCH: 30.4 pg (ref 26.0–34.0)
MCHC: 34.4 g/dL (ref 30.0–36.0)
MCV: 88.3 fL (ref 78.0–100.0)
MONOS PCT: 8 %
Monocytes Absolute: 0.7 10*3/uL (ref 0.1–1.0)
NEUTROS PCT: 61 %
Neutro Abs: 5.6 10*3/uL (ref 1.7–7.7)
Platelets: 212 10*3/uL (ref 150–400)
RBC: 5.56 MIL/uL (ref 4.22–5.81)
RDW: 12.8 % (ref 11.5–15.5)
WBC: 9 10*3/uL (ref 4.0–10.5)

## 2016-12-18 LAB — PROTIME-INR
INR: 1.05
PROTHROMBIN TIME: 13.6 s (ref 11.4–15.2)

## 2016-12-19 LAB — AFP TUMOR MARKER: AFP, SERUM, TUMOR MARKER: 2.3 ng/mL (ref 0.0–8.3)

## 2016-12-19 NOTE — Progress Notes (Signed)
cc'd to pcp 

## 2016-12-21 ENCOUNTER — Ambulatory Visit (HOSPITAL_COMMUNITY)
Admission: RE | Admit: 2016-12-21 | Discharge: 2016-12-21 | Disposition: A | Discharge status: Home / Self Care | Payer: Self-pay | Source: Ambulatory Visit | Attending: Nurse Practitioner | Admitting: Nurse Practitioner

## 2016-12-21 DIAGNOSIS — K824 Cholesterolosis of gallbladder: Secondary | ICD-10-CM | POA: Insufficient documentation

## 2016-12-21 DIAGNOSIS — I8510 Secondary esophageal varices without bleeding: Secondary | ICD-10-CM | POA: Insufficient documentation

## 2016-12-21 DIAGNOSIS — K7030 Alcoholic cirrhosis of liver without ascites: Secondary | ICD-10-CM | POA: Insufficient documentation

## 2016-12-29 NOTE — Progress Notes (Signed)
Pt is aware.  

## 2016-12-29 NOTE — Progress Notes (Signed)
Called. Many rings and no answer.  

## 2016-12-29 NOTE — Progress Notes (Signed)
Called, many rings and no answer.

## 2016-12-29 NOTE — Progress Notes (Signed)
PT is aware.

## 2017-03-05 ENCOUNTER — Ambulatory Visit: Payer: Self-pay | Attending: Family Medicine

## 2017-06-05 ENCOUNTER — Ambulatory Visit: Payer: Self-pay | Attending: Internal Medicine | Admitting: Internal Medicine

## 2017-06-05 ENCOUNTER — Encounter: Payer: Self-pay | Admitting: Internal Medicine

## 2017-06-05 VITALS — BP 126/82 | HR 85 | Temp 98.3°F | Resp 16 | Wt 188.6 lb

## 2017-06-05 DIAGNOSIS — K219 Gastro-esophageal reflux disease without esophagitis: Secondary | ICD-10-CM | POA: Insufficient documentation

## 2017-06-05 DIAGNOSIS — K7030 Alcoholic cirrhosis of liver without ascites: Secondary | ICD-10-CM | POA: Insufficient documentation

## 2017-06-05 DIAGNOSIS — Z87891 Personal history of nicotine dependence: Secondary | ICD-10-CM | POA: Insufficient documentation

## 2017-06-05 NOTE — Progress Notes (Signed)
Patient ID: Danny Arnold, male    DOB: November 16, 1975  MRN: [id number]  CC: re-establish and Gastroesophageal Reflux   Subjective: Danny Arnold is a 42 y.o. male who presents for chronic disease management and to establish with me as PCP.  Previous PCP was NP Hairston who has left the practice. His concerns today include:  Hx of ETOH cirrhosis, esophageal varices  Patient with history of alcoholic cirrhosis.  He is followed by [facility] gastroenterology.  He has quit drinking completely.  He has completed hepatitis B vaccine series.  Last liver ultrasound was in November 2018.  No liver masses identified. No N/V.  Moving bowels ok. No blood in stools.   Does not smoke Currently not on any medications.  Patient Active Problem List   Diagnosis Date Noted  . Gastritis and gastroduodenitis   . Umbilical hernia 46/27/0350  . Metabolic acidosis 09/38/1829  . Esophageal varices in alcoholic cirrhosis ([place]) 93/71/6967  . GI bleed 11/24/2012  . Acute blood loss anemia 11/24/2012  . Abdominal pain 11/24/2012  . Alcoholic hepatitis 89/38/1017  . Cirrhosis, alcoholic ([place]) 51/03/5850     No current outpatient medications on file prior to visit.   No current facility-administered medications on file prior to visit.     No Known Allergies  Social History   Socioeconomic History  . Marital status: Single    Spouse name: Not on file  . Number of children: Not on file  . Years of education: Not on file  . Highest education level: Not on file  Occupational History  . Not on file  Social Needs  . Financial resource strain: Not on file  . Food insecurity:    Worry: Not on file    Inability: Not on file  . Transportation needs:    Medical: Not on file    Non-medical: Not on file  Tobacco Use  . Smoking status: Former Research scientist (life sciences)  . Smokeless tobacco: Never Used  . Tobacco comment: Quit smoking more than a year ago  Substance and Sexual Activity  . Alcohol use: No   Alcohol/week: 0.0 oz    Comment: heavy etoh until 02/2012- none since 2014.  . Drug use: No  . Sexual activity: Not on file  Lifestyle  . Physical activity:    Days per week: Not on file    Minutes per session: Not on file  . Stress: Not on file  Relationships  . Social connections:    Talks on phone: Not on file    Gets together: Not on file    Attends religious service: Not on file    Active member of club or organization: Not on file    Attends meetings of clubs or organizations: Not on file    Relationship status: Not on file  . Intimate partner violence:    Fear of current or ex partner: Not on file    Emotionally abused: Not on file    Physically abused: Not on file    Forced sexual activity: Not on file  Other Topics Concern  . 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    Family History  Problem Relation Age of Onset  . Migraines Mother   . Colon cancer Neg Hx     Past Surgical History:  Procedure Laterality Date  . COLONOSCOPY N/A 11/25/2012   IH  . ESOPHAGOGASTRODUODENOSCOPY N/A 11/25/2012   GRADE 1 VARICES  . ESOPHAGOGASTRODUODENOSCOPY  N/A 04/20/2016   Procedure: ESOPHAGOGASTRODUODENOSCOPY (EGD);  Surgeon: Danie Binder, MD;  Location: [facility];  Service: Endoscopy;  Laterality: N/A;  8:30am  . HAND SURGERY    . HEMORRHOID BANDING  2014 x1  . UMBILICAL HERNIA REPAIR N/A 08/30/2015   Procedure: UMBILICAL HERNIORRHAPHY WITH MESH;  Surgeon: Aviva Signs, MD;  Location: [facility] ORS;  Service: General;  Laterality: N/A;    ROS: Review of Systems Negative except as stated above PHYSICAL EXAM: BP 126/82   Pulse 85   Temp 98.3 F (36.8 C) (Oral)   Resp 16   Wt 188 lb 9.6 oz (85.5 kg)   SpO2 95%   BMI 28.68 kg/m   Physical Exam General appearance - alert, well appearing, and in no distress Mental status - alert, oriented to person, place, and time, normal mood, behavior, speech, dress, motor activity, and  thought processes Neck - supple, no significant adenopathy Chest - clear to auscultation, no wheezes, rales or rhonchi, symmetric air entry Heart - normal rate, regular rhythm, normal S1, S2, no murmurs, rubs, clicks or gallops Abdomen - soft, nontender, nondistended, no masses or organomegaly Extremities - peripheral pulses normal, no pedal edema, no clubbing or cyanosis    Chemistry      Component Value Date/Time   NA 141 12/18/2016 1024   K 3.9 12/18/2016 1024   CL 99 (L) 12/18/2016 1024   CO2 24 12/18/2016 1024   BUN 16 12/18/2016 1024   CREATININE 0.97 12/18/2016 1024   CREATININE 1.02 08/12/2015 1516      Component Value Date/Time   CALCIUM 9.9 12/18/2016 1024   ALKPHOS 65 12/18/2016 1024   AST 24 12/18/2016 1024   ALT 35 12/18/2016 1024   BILITOT 0.8 12/18/2016 1024     Lab Results  Component Value Date   WBC 9.0 12/18/2016   HGB 16.9 12/18/2016   HCT 49.1 12/18/2016   MCV 88.3 12/18/2016   PLT 212 12/18/2016    ASSESSMENT AND PLAN: 1. Alcoholic cirrhosis of liver without ascites (HCC) -Clinically he is doing well.  Encouraged him to continue to abstain from alcohol.  Follow-up again in 6 months.   Patient was given the opportunity to ask questions.  Patient verbalized understanding of the plan and was able to repeat key elements of the plan.   No orders of the defined types were placed in this encounter.    Requested Prescriptions    No prescriptions requested or ordered in this encounter    Return in about 6 months (around 12/05/2017).  Karle Plumber, MD, FACP

## 2017-06-08 ENCOUNTER — Ambulatory Visit: Payer: Self-pay | Attending: Internal Medicine

## 2017-06-19 ENCOUNTER — Ambulatory Visit (INDEPENDENT_AMBULATORY_CARE_PROVIDER_SITE_OTHER): Payer: Self-pay | Admitting: Nurse Practitioner

## 2017-06-19 ENCOUNTER — Other Ambulatory Visit (HOSPITAL_COMMUNITY)
Admission: RE | Admit: 2017-06-19 | Discharge: 2017-06-19 | Disposition: A | Discharge status: Home / Self Care | Payer: Self-pay | Source: Ambulatory Visit | Attending: Nurse Practitioner | Admitting: Nurse Practitioner

## 2017-06-19 ENCOUNTER — Encounter: Payer: Self-pay | Admitting: *Deleted

## 2017-06-19 ENCOUNTER — Encounter: Payer: Self-pay | Admitting: Nurse Practitioner

## 2017-06-19 VITALS — BP 130/81 | HR 88 | Temp 97.1°F | Ht 68.0 in | Wt 186.6 lb

## 2017-06-19 DIAGNOSIS — Z87898 Personal history of other specified conditions: Secondary | ICD-10-CM | POA: Insufficient documentation

## 2017-06-19 DIAGNOSIS — K7030 Alcoholic cirrhosis of liver without ascites: Secondary | ICD-10-CM | POA: Insufficient documentation

## 2017-06-19 DIAGNOSIS — F1011 Alcohol abuse, in remission: Secondary | ICD-10-CM | POA: Insufficient documentation

## 2017-06-19 LAB — PROTIME-INR
INR: 1.01
PROTHROMBIN TIME: 13.2 s (ref 11.4–15.2)

## 2017-06-19 LAB — CBC WITH DIFFERENTIAL/PLATELET
BASOS PCT: 0 %
Basophils Absolute: 0 10*3/uL (ref 0.0–0.1)
Eosinophils Absolute: 0.2 10*3/uL (ref 0.0–0.7)
Eosinophils Relative: 2 %
HEMATOCRIT: 49 % (ref 39.0–52.0)
Hemoglobin: 16.8 g/dL (ref 13.0–17.0)
Lymphocytes Relative: 25 %
Lymphs Abs: 2.8 10*3/uL (ref 0.7–4.0)
MCH: 30.3 pg (ref 26.0–34.0)
MCHC: 34.3 g/dL (ref 30.0–36.0)
MCV: 88.3 fL (ref 78.0–100.0)
MONO ABS: 1.2 10*3/uL — AB (ref 0.1–1.0)
MONOS PCT: 11 %
NEUTROS ABS: 7.1 10*3/uL (ref 1.7–7.7)
Neutrophils Relative %: 62 %
Platelets: 249 10*3/uL (ref 150–400)
RBC: 5.55 MIL/uL (ref 4.22–5.81)
RDW: 12.8 % (ref 11.5–15.5)
WBC: 11.4 10*3/uL — ABNORMAL HIGH (ref 4.0–10.5)

## 2017-06-19 LAB — COMPREHENSIVE METABOLIC PANEL
ALBUMIN: 4.1 g/dL (ref 3.5–5.0)
ALT: 42 U/L (ref 17–63)
ANION GAP: 8 (ref 5–15)
AST: 27 U/L (ref 15–41)
Alkaline Phosphatase: 74 U/L (ref 38–126)
BUN: 18 mg/dL (ref 6–20)
CO2: 26 mmol/L (ref 22–32)
Calcium: 9.6 mg/dL (ref 8.9–10.3)
Chloride: 105 mmol/L (ref 101–111)
Creatinine, Ser: 1 mg/dL (ref 0.61–1.24)
Glucose, Bld: 98 mg/dL (ref 65–99)
POTASSIUM: 4.1 mmol/L (ref 3.5–5.1)
Sodium: 139 mmol/L (ref 135–145)
Total Bilirubin: 0.6 mg/dL (ref 0.3–1.2)
Total Protein: 8.3 g/dL — ABNORMAL HIGH (ref 6.5–8.1)

## 2017-06-19 NOTE — Patient Instructions (Signed)
English  1. Have your labs completed when you can. 2. Our office will help schedule your ultrasound of your liver. 3. Follow-up in 6 months. 4. Call us if you have any questions or concerns   Spanish  1. Completa tus laboratorios cuando puedas. 2. Danny Arnold oficina le ayudar a programar su ultrasonido de su hgado. 3. Seguimiento en 6 meses. 4. Llmenos si tiene alguna pregunta o inquietud.  It was good seeing you today!  I hope you have a great summer!! Fue bueno verte hoy! Espero que tengas un buen verano !!    NOTE: The AVS translation was verified for accuracy with a system translator.

## 2017-06-19 NOTE — Assessment & Plan Note (Signed)
Historically has had well compensated disease.  He is generally asymptomatic from a hepatic standpoint today.  His last meld score was 7, child Pugh class A.  We will recheck routine liver labs as well as liver imaging for hepatoma screening.  Follow-up in 6 months.  Call with any worsening symptoms, new symptoms, questions, or concerns.

## 2017-06-19 NOTE — Assessment & Plan Note (Signed)
History of alcohol abuse, has abstained from alcohol since 2008.  Recommend he continue alcohol abstinence.  Return for follow-up in 6 months.

## 2017-06-19 NOTE — Progress Notes (Signed)
Referring Provider: Orlando Penner* Primary Care Physician:  Ladell Pier, MD Primary GI:  Dr. Oneida Alar  Chief Complaint  Patient presents with  . Cirrhosis    HPI:   Danny Arnold is a 42 y.o. male who presents for follow-up on alcoholic cirrhosis and esophageal varices.  The patient was last seen in our office 12/14/2016 for the same.  Hepatitis B series has been completed.  Grade 1 esophageal varices in 2014.  History of overall well compensated disease.  History of surgical repair of umbilical hernia.  No alcohol consumption since 2014.  EGD updated 04/20/2016 with grade 1 varices in the lower third of the esophagus, patchy mild inflammation in the gastric antrum status post biopsy, patchy mild inflammation in the duodenum.  Deemed likely mild gastritis/duodenitis.  Recommended continue current medications.  Biopsies found to be benign gastric mucosa.  No further EGD at this time.  His last visit he was doing well, no overt GI or hepatic symptoms.  Recommend continue alcohol cessation, routine liver labs, liver imaging, follow-up in 6 months.  Labs completed 12/18/2016 which found normal AFP, normal INR, LFTs normal, no anemia or leukocytosis.  Calculated meld score of 7 and child Pugh class A. Overall continues well compensated disease in the setting of alcohol cessation.  Today he is accompanied by a Publishing copy.  Today he states he's feeling well. Denies abdominal pain, N/V, hematochezia, melena, fever, chills, unintentional weight loss. Denies yellowing of skin/eyes, darkened urine, acute episodic confusion, tremors/shakes, bruising/bleeding. Denies chest pain, dyspnea, dizziness, lightheadedness, syncope, near syncope. Denies any other upper or lower GI symptoms.  Not currently on any medications. Continues to abstain from ETOH.  Past Medical History:  Diagnosis Date  . Alcoholic hepatitis   . Alcoholic liver disease ([place])   . Anemia   . Ascites 02/03/2013    . Bacteremia     Past Surgical History:  Procedure Laterality Date  . COLONOSCOPY N/A 11/25/2012   IH  . ESOPHAGOGASTRODUODENOSCOPY N/A 11/25/2012   GRADE 1 VARICES  . ESOPHAGOGASTRODUODENOSCOPY N/A 04/20/2016   Procedure: ESOPHAGOGASTRODUODENOSCOPY (EGD);  Surgeon: Danie Binder, MD;  [facility];  Service: Endoscopy;  Laterality: N/A;  8:30am  . HAND SURGERY    . HEMORRHOID BANDING  2014 x1  . UMBILICAL HERNIA REPAIR N/A 08/30/2015   Procedure: UMBILICAL HERNIORRHAPHY WITH MESH;  Surgeon: Aviva Signs, MD;  Location: [facility];  Service: General;  Laterality: N/A;    No current outpatient medications on file.   No current facility-administered medications for this visit.     Allergies as of 06/19/2017  . (No Known Allergies)    Family History  Problem Relation Age of Onset  . Migraines Mother   . Colon cancer Neg Hx     Social History   Socioeconomic History  . Marital status: Single    Spouse name: Not on file  . Number of children: Not on file  . Years of education: Not on file  . Highest education level: Not on file  Occupational History  . Not on file  Social Needs  . Financial resource strain: Not on file  . Food insecurity:    Worry: Not on file    Inability: Not on file  . Transportation needs:    Medical: Not on file    Non-medical: Not on file  Tobacco Use  . Smoking status: Former Research scientist (life sciences)  . Smokeless tobacco: Never Used  . Tobacco comment: Quit smoking more than  a year ago  Substance and Sexual Activity  . Alcohol use: No    Alcohol/week: 0.0 oz    Comment: heavy etoh until 02/2012- none since 2014.  . Drug use: No  . Sexual activity: Not on file  Lifestyle  . Physical activity:    Days per week: Not on file    Minutes per session: Not on file  . Stress: Not on file  Relationships  . Social connections:    Talks on phone: Not on file    Gets together: Not on file    Attends religious service: Not on file    Active member of  club or organization: Not on file    Attends meetings of clubs or organizations: Not on file    Relationship status: Not on file  Other Topics Concern  . 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    Review of Systems: Complete ROS negative except as per HPI.   Physical Exam: BP 130/81   Pulse 88   Temp (!) 97.1 F (36.2 C) (Oral)   Ht 5\' 8"  (1.727 m)   Wt 186 lb 9.6 oz (84.6 kg)   BMI 28.37 kg/m  General:   Alert and oriented. Pleasant and cooperative. Well-nourished and well-developed.  Eyes:  Without icterus, sclera clear and conjunctiva pink.  Ears:  Normal auditory acuity. Cardiovascular:  S1, S2 present without murmurs appreciated. Extremities without clubbing or edema. Respiratory:  Clear to auscultation bilaterally. No wheezes, rales, or rhonchi. No distress.  Gastrointestinal:  +BS, soft, non-tender and non-distended. No HSM noted. No guarding or rebound. No masses appreciated.  Rectal:  Deferred  Musculoskalatal:  Symmetrical without gross deformities. Skin:  Intact without significant lesions or rashes. Neurologic:  Alert and oriented x4;  grossly normal neurologically. Psych:  Alert and cooperative. Normal mood and affect. Heme/Lymph/Immune: No excessive bruising noted.    06/19/2017 10:34 AM   Disclaimer: This note was dictated with voice recognition software. Similar sounding words can inadvertently be transcribed and may not be corrected upon review.

## 2017-06-20 LAB — AFP TUMOR MARKER: AFP, SERUM, TUMOR MARKER: 1.9 ng/mL (ref 0.0–8.3)

## 2017-06-25 ENCOUNTER — Other Ambulatory Visit: Payer: Self-pay | Admitting: Nurse Practitioner

## 2017-06-25 ENCOUNTER — Ambulatory Visit (HOSPITAL_COMMUNITY)
Admission: RE | Admit: 2017-06-25 | Discharge: 2017-06-25 | Disposition: A | Discharge status: Home / Self Care | Payer: Self-pay | Source: Ambulatory Visit | Attending: Nurse Practitioner | Admitting: Nurse Practitioner

## 2017-06-25 DIAGNOSIS — K760 Fatty (change of) liver, not elsewhere classified: Secondary | ICD-10-CM

## 2017-06-25 DIAGNOSIS — F1011 Alcohol abuse, in remission: Secondary | ICD-10-CM

## 2017-06-25 DIAGNOSIS — K7030 Alcoholic cirrhosis of liver without ascites: Secondary | ICD-10-CM | POA: Insufficient documentation

## 2017-06-25 DIAGNOSIS — Z87898 Personal history of other specified conditions: Secondary | ICD-10-CM | POA: Insufficient documentation

## 2017-06-25 NOTE — Progress Notes (Signed)
LMOM to call.

## 2017-06-25 NOTE — Progress Notes (Signed)
See U/S liver result note for more details

## 2017-06-26 NOTE — Progress Notes (Signed)
I called interpreter @ [phone] and spoke to [place] [phone]). He called pt and we gave the info and pt said he has not felt sick. See [place] result.

## 2017-06-26 NOTE — Progress Notes (Signed)
I called interpreter @ [phone] and spoke to [place] [phone]). We informed pt of results and he said it is fine to schedule the MRI.  Forwarding to [facility].

## 2017-06-29 ENCOUNTER — Ambulatory Visit (HOSPITAL_COMMUNITY)
Admission: RE | Admit: 2017-06-29 | Discharge: 2017-06-29 | Disposition: A | Discharge status: Home / Self Care | Payer: Self-pay | Source: Ambulatory Visit | Attending: Nurse Practitioner | Admitting: Nurse Practitioner

## 2017-06-29 DIAGNOSIS — K760 Fatty (change of) liver, not elsewhere classified: Secondary | ICD-10-CM | POA: Insufficient documentation

## 2017-06-29 DIAGNOSIS — K769 Liver disease, unspecified: Secondary | ICD-10-CM | POA: Insufficient documentation

## 2017-06-29 MED ORDER — GADOBENATE DIMEGLUMINE 529 MG/ML IV SOLN
15.0000 mL | Freq: Once | INTRAVENOUS | Status: AC | PRN
  Administered 2017-06-29: 15 mL via INTRAVENOUS

## 2017-07-04 NOTE — Progress Notes (Signed)
I called pt via interpreter [phone] [place]) and pt is aware of results.

## 2017-08-09 ENCOUNTER — Encounter: Payer: Self-pay | Admitting: Gastroenterology

## 2017-12-04 ENCOUNTER — Encounter: Payer: Self-pay | Admitting: Internal Medicine

## 2017-12-04 ENCOUNTER — Ambulatory Visit: Payer: Self-pay | Attending: Internal Medicine | Admitting: Internal Medicine

## 2017-12-04 VITALS — BP 128/87 | HR 95 | Temp 98.2°F | Resp 16 | Wt 193.2 lb

## 2017-12-04 DIAGNOSIS — K7010 Alcoholic hepatitis without ascites: Secondary | ICD-10-CM | POA: Insufficient documentation

## 2017-12-04 DIAGNOSIS — Z23 Encounter for immunization: Secondary | ICD-10-CM

## 2017-12-04 DIAGNOSIS — Z87891 Personal history of nicotine dependence: Secondary | ICD-10-CM | POA: Insufficient documentation

## 2017-12-04 DIAGNOSIS — R030 Elevated blood-pressure reading, without diagnosis of hypertension: Secondary | ICD-10-CM | POA: Insufficient documentation

## 2017-12-04 DIAGNOSIS — K7030 Alcoholic cirrhosis of liver without ascites: Secondary | ICD-10-CM | POA: Insufficient documentation

## 2017-12-04 NOTE — Progress Notes (Signed)
Patient ID: Danny Arnold, male    DOB: 1975/07/15  MRN: [id number]  CC: Follow-up (6 month)   Subjective: Danny Arnold is a 42 y.o. male who presents for chronic disease management His concerns today include:  Hx of ETOH cirrhosis, esophageal varices  Since last visit, he reports he is doing well He remains free of ETOH.   Saw GI 06/2017.  MRI abdomen was negative for signs of [facility]  Patient Active Problem List   Diagnosis Date Noted  . History of alcohol abuse 06/19/2017  . Gastritis and gastroduodenitis   . Umbilical hernia 40/98/1191  . Metabolic acidosis 47/82/9562  . Esophageal varices in alcoholic cirrhosis ([place]) 13/09/6576  . GI bleed 11/24/2012  . Acute blood loss anemia 11/24/2012  . Abdominal pain 11/24/2012  . Alcoholic hepatitis 46/96/2952  . Cirrhosis, alcoholic ([place]) 84/13/2440     No current outpatient medications on file prior to visit.   No current facility-administered medications on file prior to visit.     No Known Allergies  Social History   Socioeconomic History  . Marital status: Single    Spouse name: Not on file  . Number of children: Not on file  . Years of education: Not on file  . Highest education level: Not on file  Occupational History  . Not on file  Social Needs  . Financial resource strain: Not on file  . Food insecurity:    Worry: Not on file    Inability: Not on file  . Transportation needs:    Medical: Not on file    Non-medical: Not on file  Tobacco Use  . Smoking status: Former Research scientist (life sciences)  . Smokeless tobacco: Never Used  . Tobacco comment: Quit smoking more than a year ago  Substance and Sexual Activity  . Alcohol use: No    Alcohol/week: 0.0 standard drinks    Comment: heavy etoh until 02/2012- none since 2014.  . Drug use: No  . Sexual activity: Not on file  Lifestyle  . Physical activity:    Days per week: Not on file    Minutes per session: Not on file  . Stress: Not on file  Relationships  . Social  connections:    Talks on phone: Not on file    Gets together: Not on file    Attends religious service: Not on file    Active member of club or organization: Not on file    Attends meetings of clubs or organizations: Not on file    Relationship status: Not on file  . Intimate partner violence:    Fear of current or ex partner: Not on file    Emotionally abused: Not on file    Physically abused: Not on file    Forced sexual activity: Not on file  Other Topics Concern  . 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    Family History  Problem Relation Age of Onset  . Migraines Mother   . Colon cancer Neg Hx     Past Surgical History:  Procedure Laterality Date  . COLONOSCOPY N/A 11/25/2012   IH  . ESOPHAGOGASTRODUODENOSCOPY N/A 11/25/2012   GRADE 1 VARICES  . ESOPHAGOGASTRODUODENOSCOPY N/A 04/20/2016   Procedure: ESOPHAGOGASTRODUODENOSCOPY (EGD);  Surgeon: Danie Binder, MD;  [facility];  Service: Endoscopy;  Laterality: N/A;  8:30am  . HAND SURGERY    . HEMORRHOID BANDING  2014 x1  .  UMBILICAL HERNIA REPAIR N/A 08/30/2015   Procedure: UMBILICAL HERNIORRHAPHY WITH MESH;  Surgeon: Aviva Signs, MD;  Location: [facility];  Service: General;  Laterality: N/A;    ROS: Review of Systems  Constitutional: Negative for appetite change.       Not getting much exercise. "I keep saying I will start tomorrow."   Respiratory: Negative for cough and shortness of breath.   Cardiovascular: Negative for chest pain.  Gastrointestinal: Negative for abdominal distention and abdominal pain.  Psychiatric/Behavioral: Negative for dysphoric mood. The patient is not nervous/anxious.    PHYSICAL EXAM: BP 128/87   Pulse 95   Temp 98.2 F (36.8 C) (Oral)   Resp 16   Wt 193 lb 3.2 oz (87.6 kg)   SpO2 95%   BMI 29.38 kg/m   Physical Exam  General appearance - alert, well appearing, and in no distress Mental status - normal mood,  behavior, speech, dress, motor activity, and thought processes Neck - supple, no significant adenopathy Chest - clear to auscultation, no wheezes, rales or rhonchi, symmetric air entry Heart - normal rate, regular rhythm, normal S1, S2, no murmurs, rubs, clicks or gallops Abdomen - soft, nontender, nondistended, no masses or organomegaly Extremities - peripheral pulses normal, no pedal edema, no clubbing or cyanosis   ASSESSMENT AND PLAN: 1. Alcoholic cirrhosis of liver without ascites ([place]) Compensated and followed by gastroenterology  2. Need for influenza vaccination   3. Elevated blood pressure reading DASH diet discussed.  Encourage some form of regular exercise at least 3 to 4 days a week for 30 minutes.  We will recheck blood pressure on follow-up visit.   Patient was given the opportunity to ask questions.  Patient verbalized understanding of the plan and was able to repeat key elements of the plan.  [employer] interpreter used during this encounter. #[id number]  No orders of the defined types were placed in this encounter.    Requested Prescriptions    No prescriptions requested or ordered in this encounter    No follow-ups on file.  Karle Plumber, MD, FACP

## 2017-12-20 ENCOUNTER — Ambulatory Visit: Payer: Self-pay | Admitting: Nurse Practitioner

## 2017-12-21 ENCOUNTER — Encounter: Payer: Self-pay | Admitting: *Deleted

## 2017-12-21 ENCOUNTER — Other Ambulatory Visit (HOSPITAL_COMMUNITY)
Admission: RE | Admit: 2017-12-21 | Discharge: 2017-12-21 | Disposition: A | Discharge status: Home / Self Care | Payer: Self-pay | Source: Ambulatory Visit | Attending: Nurse Practitioner | Admitting: Nurse Practitioner

## 2017-12-21 ENCOUNTER — Encounter: Payer: Self-pay | Admitting: Nurse Practitioner

## 2017-12-21 ENCOUNTER — Ambulatory Visit (INDEPENDENT_AMBULATORY_CARE_PROVIDER_SITE_OTHER): Payer: Self-pay | Admitting: Nurse Practitioner

## 2017-12-21 ENCOUNTER — Encounter: Payer: Self-pay | Admitting: Gastroenterology

## 2017-12-21 VITALS — BP 138/79 | HR 82 | Temp 97.0°F | Ht 68.0 in | Wt 187.2 lb

## 2017-12-21 DIAGNOSIS — K7030 Alcoholic cirrhosis of liver without ascites: Secondary | ICD-10-CM | POA: Insufficient documentation

## 2017-12-21 DIAGNOSIS — K5900 Constipation, unspecified: Secondary | ICD-10-CM | POA: Insufficient documentation

## 2017-12-21 DIAGNOSIS — F1011 Alcohol abuse, in remission: Secondary | ICD-10-CM

## 2017-12-21 LAB — COMPREHENSIVE METABOLIC PANEL
ALBUMIN: 4.3 g/dL (ref 3.5–5.0)
ALT: 47 U/L — ABNORMAL HIGH (ref 0–44)
AST: 27 U/L (ref 15–41)
Alkaline Phosphatase: 70 U/L (ref 38–126)
Anion gap: 7 (ref 5–15)
BUN: 19 mg/dL (ref 6–20)
CHLORIDE: 105 mmol/L (ref 98–111)
CO2: 25 mmol/L (ref 22–32)
CREATININE: 1.01 mg/dL (ref 0.61–1.24)
Calcium: 9.7 mg/dL (ref 8.9–10.3)
GFR calc Af Amer: >60 mL/min (ref >60)
GLUCOSE: 98 mg/dL (ref 70–99)
POTASSIUM: 5.2 mmol/L — AB (ref 3.5–5.1)
Sodium: 137 mmol/L (ref 135–145)
Total Bilirubin: 0.8 mg/dL (ref 0.3–1.2)
Total Protein: 8.5 g/dL — ABNORMAL HIGH (ref 6.5–8.1)

## 2017-12-21 LAB — CBC WITH DIFFERENTIAL/PLATELET
Abs Immature Granulocytes: 0.03 10*3/uL (ref 0.00–0.07)
BASOS ABS: 0 10*3/uL (ref 0.0–0.1)
BASOS PCT: 0 %
Eosinophils Absolute: 0.2 10*3/uL (ref 0.0–0.5)
Eosinophils Relative: 2 %
HCT: 51.9 % (ref 39.0–52.0)
Hemoglobin: 17.1 g/dL — ABNORMAL HIGH (ref 13.0–17.0)
IMMATURE GRANULOCYTES: 0 %
LYMPHS ABS: 2.8 10*3/uL (ref 0.7–4.0)
Lymphocytes Relative: 30 %
MCH: 29.3 pg (ref 26.0–34.0)
MCHC: 32.9 g/dL (ref 30.0–36.0)
MCV: 88.9 fL (ref 80.0–100.0)
MONOS PCT: 10 %
Monocytes Absolute: 0.9 10*3/uL (ref 0.1–1.0)
NEUTROS ABS: 5.4 10*3/uL (ref 1.7–7.7)
Neutrophils Relative %: 58 %
PLATELETS: 278 10*3/uL (ref 150–400)
RBC: 5.84 MIL/uL — ABNORMAL HIGH (ref 4.22–5.81)
RDW: 13 % (ref 11.5–15.5)
WBC: 9.4 10*3/uL (ref 4.0–10.5)
nRBC: 0 % (ref 0.0–0.2)

## 2017-12-21 LAB — PROTIME-INR
INR: 0.98
Prothrombin Time: 12.9 seconds (ref 11.4–15.2)

## 2017-12-21 NOTE — Progress Notes (Signed)
Referring Provider: Ladell Pier, MD Primary Care Physician:  Ladell Pier, MD Primary GI:  Dr. Oneida Alar  Chief Complaint  Patient presents with  . Cirrhosis  . Constipation    feels tight in abd; relieved with bm    HPI:   Danny Arnold is a 42 y.o. male who presents for follow-up on alcoholic cirrhosis.  Patient was last seen in our office 07/16/9526 for alcoholic cirrhosis and history of alcohol abuse.  Also noted history of esophageal varices with EGD up-to-date 04/20/2016 with grade 1 varices in the lower third of the esophagus and likely mild gastritis/duodenitis.  His liver disease is typically well compensated with his most recent MELD score 7, Child Pugh Class A.  This is undoubtably due to alcohol cessation with no alcohol since 2014.  At his last visit he was doing well, denied any overt GI or hepatic symptoms.  Continued with alcohol cessation.  Recommended continued alcohol cessation, update routine cirrhosis labs and imaging.  Follow-up in 6 months.  Right upper quadrant ultrasound found consistent with known cirrhosis, area of decreased echogenicity in the subcapsular portion of the left lobe there is more conspicuous today which may reflect focal fatty sparing in the setting of fatty infiltration but hepatic neoplasm not excluded.  Recommended MRI follow-up.  MRI was completed 06/29/2017 with morphologic features of early cirrhosis, no findings suspicious for hepatoma.  Noted enhancing lesion in the liver on ultrasound compatible with benign hemangioma.  His labs were completed 06/19/2017 which found mild leukocytosis 11.4 otherwise with normal hemoglobin and platelet count at 249.  CMP essentially normal with no elevated liver enzymes.  AFP normal at 1.9 which is reassuring.  INR normal at 1.01.  Today he is accompanied by system translator.  Today he states he's having some constipation. Will get abdominal discomfort and bloating/tightness if he holds on having a  bowel movement (ie- if driving or no available bowel movement) and this is relieved with a bowel movement. Typically has a bowel movement daily, consistent with [place] 4. Denies abdominal pain, N/V, hematochezia, melena. Denies yellowing of skin/eyes, darkened urine, acute eisodic confusion, tremors, generalized pruritis. Denies chest pain, dyspnea, dizziness, lightheadedness, syncope, near syncope. Denies any other upper or lower GI symptoms. Still abstaining from alcohol.  Past Medical History:  Diagnosis Date  . Alcoholic hepatitis   . Alcoholic liver disease ([place])   . Anemia   . Ascites 02/03/2013  . Bacteremia     Past Surgical History:  Procedure Laterality Date  . COLONOSCOPY N/A 11/25/2012   IH  . ESOPHAGOGASTRODUODENOSCOPY N/A 11/25/2012   GRADE 1 VARICES  . ESOPHAGOGASTRODUODENOSCOPY N/A 04/20/2016   Procedure: ESOPHAGOGASTRODUODENOSCOPY (EGD);  Surgeon: Danie Binder, MD;  [facility];  Service: Endoscopy;  Laterality: N/A;  8:30am  . HAND SURGERY    . HEMORRHOID BANDING  2014 x1  . UMBILICAL HERNIA REPAIR N/A 08/30/2015   Procedure: UMBILICAL HERNIORRHAPHY WITH MESH;  Surgeon: Aviva Signs, MD;  Location: [facility];  Service: General;  Laterality: N/A;    No current outpatient medications on file.   No current facility-administered medications for this visit.     Allergies as of 12/21/2017  . (No Known Allergies)    Family History  Problem Relation Age of Onset  . Migraines Mother   . Colon cancer Neg Hx     Social History   Socioeconomic History  . Marital status: Single    Spouse name: Not on file  . Number  of children: Not on file  . Years of education: Not on file  . Highest education level: Not on file  Occupational History  . Not on file  Social Needs  . Financial resource strain: Not on file  . Food insecurity:    Worry: Not on file    Inability: Not on file  . Transportation needs:    Medical: Not on file    Non-medical: Not on file   Tobacco Use  . Smoking status: Former Research scientist (life sciences)  . Smokeless tobacco: Never Used  . Tobacco comment: Quit smoking more than a year ago  Substance and Sexual Activity  . Alcohol use: No    Alcohol/week: 0.0 standard drinks    Comment: heavy etoh until 02/2012- none since 2014.  . Drug use: No  . Sexual activity: Not on file  Lifestyle  . Physical activity:    Days per week: Not on file    Minutes per session: Not on file  . Stress: Not on file  Relationships  . Social connections:    Talks on phone: Not on file    Gets together: Not on file    Attends religious service: Not on file    Active member of club or organization: Not on file    Attends meetings of clubs or organizations: Not on file    Relationship status: Not on file  Other Topics Concern  . 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    Review of Systems: Complete ROS negative except as per HPI.   Physical Exam: BP 138/79   Pulse 82   Temp (!) 97 F (36.1 C) (Oral)   Ht 5\' 8"  (1.727 m)   Wt 187 lb 3.2 oz (84.9 kg)   BMI 28.46 kg/m  General:   Alert and oriented. Pleasant and cooperative. Well-nourished and well-developed.  Eyes:  Without icterus, sclera clear and conjunctiva pink.  Ears:  Normal auditory acuity. Cardiovascular:  S1, S2 present without murmurs appreciated. Extremities without clubbing or edema. Respiratory:  Clear to auscultation bilaterally. No wheezes, rales, or rhonchi. No distress.  Gastrointestinal:  +BS, soft, non-tender and non-distended. No HSM noted. No guarding or rebound. No masses appreciated.  Rectal:  Deferred  Musculoskalatal:  Symmetrical without gross deformities. Neurologic:  Alert and oriented x4;  grossly normal neurologically. Psych:  Alert and cooperative. Normal mood and affect. Heme/Lymph/Immune: No excessive bruising noted.    12/21/2017 10:31 AM   Disclaimer: This note was dictated with voice recognition software.  Similar sounding words can inadvertently be transcribed and may not be corrected upon review.  **NOTE: discharge information translated into Spanish. Translation was verified by an Chief Executive Officer who verified it's accuracy**

## 2017-12-21 NOTE — Assessment & Plan Note (Signed)
With history of alcohol abuse but continues to abstain from alcohol.  This is likely why his liver disease continues to be well compensated.  No alcohol since 2014.  Recommend he continue to abstain from alcohol.  Follow-up in 6 months.

## 2017-12-21 NOTE — Assessment & Plan Note (Signed)
The patient initially felt he had constipation.  However, with further discussion he has regular daily bowel movements consistent with Bristol 4.  It seems he has an issue more if he needs to have a bowel movement and does not have an available bathroom, such as when he is driving.  He will have some bloating and discomfort.  This is all relieved when he is able to get to a bathroom and have a bowel movement.  No need for medication intervention at this time.  Follow-up as needed.

## 2017-12-21 NOTE — Assessment & Plan Note (Signed)
History of alcoholic cirrhosis.  His liver function continues to do well.  His meld score is typically a 7.  He continues to abstain from alcohol which is likely why he is doing well.  Right upper quadrant ultrasound at last point showed an abnormal area which was further characterized by MRI as likely a benign hemangioma.  At this point we will check routine liver labs and right upper quadrant ultrasound.  EGD up-to-date with a history of grade 1 esophageal varices.  Platelet count remains normal.  Return for follow-up in 6 months.

## 2017-12-21 NOTE — Patient Instructions (Signed)
1. Continue to avoid alcohol. 2. Have your labs completed when you can. 3. We will help schedule your appointment for your ultrasound. 4. Return for follow-up in 6 months. 5. Call us if you have any questions or concerns.  Spanish: 1. Contina evitando el alcohol. 2. Haga que sus laboratorios se completen cuando pueda. 3. Le ayudaremos a programar su cita para su ultrasonido. 4. Regreso para seguimiento en 6 meses. 5. Llmenos si tiene alguna pregunta o inquietud.  Fue genial verte hoy! Espero que tengas un gran [place]!

## 2017-12-21 NOTE — Addendum Note (Signed)
Addended by: Gordy Levan, ERIC A on: 12/21/2017 10:45 AM   Modules accepted: Orders

## 2017-12-22 LAB — AFP TUMOR MARKER: AFP, SERUM, TUMOR MARKER: 2 ng/mL (ref 0.0–8.3)

## 2017-12-25 NOTE — Progress Notes (Signed)
CC'D TO PCP °

## 2017-12-26 ENCOUNTER — Ambulatory Visit: Payer: Self-pay | Attending: Family Medicine

## 2017-12-26 ENCOUNTER — Ambulatory Visit (HOSPITAL_COMMUNITY)
Admission: RE | Admit: 2017-12-26 | Discharge: 2017-12-26 | Disposition: A | Discharge status: Home / Self Care | Payer: Self-pay | Source: Ambulatory Visit | Attending: Nurse Practitioner | Admitting: Nurse Practitioner

## 2017-12-26 ENCOUNTER — Telehealth: Payer: Self-pay

## 2017-12-26 DIAGNOSIS — F1011 Alcohol abuse, in remission: Secondary | ICD-10-CM | POA: Insufficient documentation

## 2017-12-26 DIAGNOSIS — K5900 Constipation, unspecified: Secondary | ICD-10-CM | POA: Insufficient documentation

## 2017-12-26 DIAGNOSIS — R935 Abnormal findings on diagnostic imaging of other abdominal regions, including retroperitoneum: Secondary | ICD-10-CM

## 2017-12-26 DIAGNOSIS — K7030 Alcoholic cirrhosis of liver without ascites: Secondary | ICD-10-CM | POA: Insufficient documentation

## 2017-12-26 DIAGNOSIS — R160 Hepatomegaly, not elsewhere classified: Secondary | ICD-10-CM | POA: Insufficient documentation

## 2017-12-26 DIAGNOSIS — K769 Liver disease, unspecified: Secondary | ICD-10-CM

## 2017-12-26 NOTE — Telephone Encounter (Signed)
Call report from [facility] Radiology on the Abdominal US.  Note especially Impression #2.  Decreased attenuation mass in the left lobe of the liver measuring 1.9 x 2.9 x 1.9 cm.   Forwarding to Walden Field, NP who ordered this and I have already spoken to him about the results.

## 2017-12-26 NOTE — Progress Notes (Signed)
PT was informed via interpreter at same time as informed of [place] results. ( See that note).

## 2017-12-26 NOTE — Progress Notes (Signed)
I called Spanish interpreter, Fara Olden # [phone]. We gave the message to pt and them Tretha Sciara took over to give pt info on MRI.

## 2017-12-26 NOTE — Telephone Encounter (Signed)
Spoke to pt via interpreter line (Joel-interpreter). Informed of MRI appt 12/28/17.

## 2017-12-26 NOTE — Telephone Encounter (Signed)
Please see result note for what to tell the patient. I am ordering a liver MRI for follow-up. Please expedite scheduling (ordering ASAP for completion by end of week..I don't want to delay this)  cc: MB for scheduling

## 2017-12-26 NOTE — Telephone Encounter (Signed)
MRI scheduled for 12/28/17 at 3:00pm, arrive at 2:30pm. NPO 4 hours prior to test.

## 2017-12-28 ENCOUNTER — Ambulatory Visit (HOSPITAL_COMMUNITY)
Admission: RE | Admit: 2017-12-28 | Discharge: 2017-12-28 | Disposition: A | Discharge status: Home / Self Care | Payer: Self-pay | Source: Ambulatory Visit | Attending: Nurse Practitioner | Admitting: Nurse Practitioner

## 2017-12-28 DIAGNOSIS — K769 Liver disease, unspecified: Secondary | ICD-10-CM | POA: Insufficient documentation

## 2017-12-28 DIAGNOSIS — R935 Abnormal findings on diagnostic imaging of other abdominal regions, including retroperitoneum: Secondary | ICD-10-CM | POA: Insufficient documentation

## 2017-12-28 DIAGNOSIS — K7460 Unspecified cirrhosis of liver: Secondary | ICD-10-CM | POA: Insufficient documentation

## 2017-12-28 MED ORDER — GADOBUTROL 1 MMOL/ML IV SOLN
7.5000 mL | Freq: Once | INTRAVENOUS | Status: AC | PRN
  Administered 2017-12-28: 7.5 mL via INTRAVENOUS

## 2018-01-03 NOTE — Progress Notes (Signed)
I called interpreter line @ [phone] and spoke with [facility] # [phone].  We informed pt of the results and he will call if he has questions or concerns.

## 2018-06-24 ENCOUNTER — Ambulatory Visit (INDEPENDENT_AMBULATORY_CARE_PROVIDER_SITE_OTHER): Payer: Self-pay | Admitting: Nurse Practitioner

## 2018-06-24 ENCOUNTER — Telehealth: Payer: Self-pay

## 2018-06-24 ENCOUNTER — Encounter: Payer: Self-pay | Admitting: Nurse Practitioner

## 2018-06-24 ENCOUNTER — Encounter: Payer: Self-pay | Admitting: Gastroenterology

## 2018-06-24 ENCOUNTER — Other Ambulatory Visit: Payer: Self-pay

## 2018-06-24 DIAGNOSIS — I8510 Secondary esophageal varices without bleeding: Secondary | ICD-10-CM

## 2018-06-24 DIAGNOSIS — F1011 Alcohol abuse, in remission: Secondary | ICD-10-CM

## 2018-06-24 DIAGNOSIS — K7030 Alcoholic cirrhosis of liver without ascites: Secondary | ICD-10-CM

## 2018-06-24 NOTE — Progress Notes (Signed)
Referring Provider: Ladell Pier, MD Primary Care Physician:  Ladell Pier, MD Primary GI:  Dr. Oneida Alar  NOTE: Service was provided via telemedicine and was requested by the patient due to COVID-19 pandemic.  Method of visit: Doxy.Me  Patient Location: At work, in a parked Scientist, clinical (histocompatibility and immunogenetics) Location: Office  Reason for Phone Visit: Follow-up  The patient was consented to phone follow-up via telephone encounter including billing of the encounter (yes/no): Yes  Persons present on the phone encounter, with roles: Coworker/translator  Total time (minutes) spent on medical discussion: 18 minutes  Chief Complaint  Patient presents with  . Cirrhosis    f/u.     HPI:   Danny Arnold is a 43 y.o. male who presents for virtual visit regarding: Follow-up on constipation and cirrhosis.  The patient was last seen in our office 98/04/3823 for alcoholic cirrhosis, history of alcohol abuse, constipation.  History of esophageal varices EGD up-to-date 04/20/2016 with grade 1 varices and mild gastritis/duodenitis.  Typically well controlled liver disease with meld score 7.  He has abstained from alcohol since 2014.  Recent ultrasound with area of decreased echogenicity in the subcapsular portion and follow-up MRI completed 06/29/2017 with no findings suspicious for hepatoma.  Enhancing lesion in the liver noted to be compatible with benign hemangioma.  The last visit he was accompanied by a Optometrist.  Having some constipation with associated bloating/tightness if he holds his bowel movement.  This is all relieved when he does have a bowel movement, which typically occurs daily and consistently Bristol 4.  No other GI or hepatic symptoms.  Still abstaining from alcohol.  Recommended continue alcohol avoidance, routine liver labs and imaging, follow-up in 6 months.  Labs completed 12/21/2017 which did have mild elevation in ALT of 47, otherwise liver labs, INR, AFP normal.  CBC  with normal platelets at 278. MELD: 7, Child-Pugh: A  Right upper quadrant ultrasound completed 12/26/2017 which found a decreased attenuation mass in the left lobe of the liver measuring 1.9 x 2.9 x 1.9 cm not appreciable on prior MRI which concern for possible neoplasia development.  Recommended MRI follow-up.  MRI completed 12/28/2017 found stable appearance of the liver without suspicious focal finding, segment 2 enhancing subcapsular lesion again likely hemangioma and sparing of steatosis with heterogenous steatosis.  Overall noted cirrhosis without secondary signs of portal hypertension.  Today he is accompanied by a work friend who he wants to serve as Optometrist for him (and he gives verbal consent for this, given virtual nature of the visit). Today he states he's doing ok overall. Denies abdominal pain, N/V, hematochezia, melena, fever, chills, unintentional weight loss. Denies yellowing of skin/eyes, darkened urine, acute episodic confusion, tremors. Denies chest pain, dyspnea, dizziness, lightheadedness, syncope, near syncope. Denies URI and flu-like symptoms. Denies any other upper or lower GI symptoms.  Not on any medications. Denies any recent alcohol.   Past Medical History:  Diagnosis Date  . Alcoholic hepatitis   . Alcoholic liver disease ([place])   . Anemia   . Ascites 02/03/2013  . Bacteremia     Past Surgical History:  Procedure Laterality Date  . COLONOSCOPY N/A 11/25/2012   IH  . ESOPHAGOGASTRODUODENOSCOPY N/A 11/25/2012   GRADE 1 VARICES  . ESOPHAGOGASTRODUODENOSCOPY N/A 04/20/2016   Procedure: ESOPHAGOGASTRODUODENOSCOPY (EGD);  Surgeon: Danie Binder, MD;  Location: [facility];  Service: Endoscopy;  Laterality: N/A;  8:30am  . HAND SURGERY    . HEMORRHOID BANDING  2014 x1  .  UMBILICAL HERNIA REPAIR N/A 08/30/2015   Procedure: UMBILICAL HERNIORRHAPHY WITH MESH;  Surgeon: Aviva Signs, MD;  Location: [facility] ORS;  Service: General;  Laterality: N/A;    No current  outpatient medications on file.   No current facility-administered medications for this visit.     Allergies as of 06/24/2018  . (No Known Allergies)    Family History  Problem Relation Age of Onset  . Migraines Mother   . Colon cancer Neg Hx     Social History   Socioeconomic History  . Marital status: Single    Spouse name: Not on file  . Number of children: Not on file  . Years of education: Not on file  . Highest education level: Not on file  Occupational History  . Not on file  Social Needs  . Financial resource strain: Not on file  . Food insecurity:    Worry: Not on file    Inability: Not on file  . Transportation needs:    Medical: Not on file    Non-medical: Not on file  Tobacco Use  . Smoking status: Former Research scientist (life sciences)  . Smokeless tobacco: Never Used  . Tobacco comment: Quit smoking more than a year ago  Substance and Sexual Activity  . Alcohol use: No    Alcohol/week: 0.0 standard drinks    Comment: heavy etoh until 02/2012- none since 2014.  . Drug use: No  . Sexual activity: Not on file  Lifestyle  . Physical activity:    Days per week: Not on file    Minutes per session: Not on file  . Stress: Not on file  Relationships  . Social connections:    Talks on phone: Not on file    Gets together: Not on file    Attends religious service: Not on file    Active member of club or organization: Not on file    Attends meetings of clubs or organizations: Not on file    Relationship status: Not on file  Other Topics Concern  . 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    Review of Systems: Complete ROS negative except as per HPI.  Physical Exam: Note: limited exam due to virtual visit General:   Alert and oriented. Pleasant and cooperative. Well-nourished and well-developed.  Head:  Normocephalic and atraumatic. Eyes:  Without icterus, sclera clear and conjunctiva pink.  Ears:  Normal auditory acuity.  Skin:  Intact without facial significant lesions or rashes. Neurologic:  Alert and oriented x4;  grossly normal neurologically. Psych:  Alert and cooperative. Normal mood and affect. Heme/Lymph/Immune: No excessive bruising noted.

## 2018-06-24 NOTE — Assessment & Plan Note (Signed)
The patient has a history of alcoholic cirrhosis.  He has not had any alcohol since 2014.  He is done quite well with his liver disease.  Most recent meld calculated at 62, child Pugh class A.  Labs generally stable.  Has had some "bumps in the road" related to his imaging with suspicious spots on ultrasound requiring MRI follow-up at his 2 most recent imaging appointments.  At this point we will proceed with routine liver labs and right upper quadrant ultrasound.  Follow-up in 6 months.

## 2018-06-24 NOTE — Progress Notes (Signed)
CC'D TO PCP °

## 2018-06-24 NOTE — Patient Instructions (Signed)
English:  Your health issues we discussed today were:   Cirrhosis: 1. Congratulations on avoiding alcohol since 2014!  You have done great with this! 2. We will check your routine labs for your liver 3. We will have an ultrasound of your liver scheduled. 4. We need to do both of these about every 6 months to continue to monitor your disease 5. At your next appointment we will discuss if we need to repeat your EGD 6. Call us if you have any worrisome symptoms  Overall I recommend:  1. Call if any questions or concerns 2. Follow-up in 6 months.  Spanish:  Sus problemas de salud que discutimos hoy fueron:  Cirrosis: 1. Felicitaciones por evitar el alcohol desde 2014! Lo has hecho muy bien con esto! 2. Revisaremos sus laboratorios de rutina en busca de su hgado. 3. Le haremos una ecografa de su hgado programada. 4. Necesitamos hacer ambas cosas cada 6 meses para continuar monitoreando su enfermedad. 5. En su prxima cita discutiremos si necesitamos repetir su EGD 6. Llmenos si tiene algn sntoma preocupante.  Because of recent events of COVID-19 ("Coronavirus"), follow CDC recommendations:  1. Wash your hand frequently 2. Avoid touching your face 3. Stay away from people who are sick 4. If you have symptoms such as fever, cough, shortness of breath then call your healthcare provider for further guidance 5. If you are sick, STAY AT HOME unless otherwise directed by your healthcare provider. 6. Follow directions from state and national officials regarding staying safe   Debido a eventos recientes de COVID-19 ("Coronavirus"), [employer] los CDC:  1. Lava tu mano frecuentemente 2. Evita tocarte la cara 3. Mantngase alejado de las personas que estn enfermas. 4. Si tiene sntomas como [place], tos, falta de [place], llame a su proveedor de atencin mdica para obtener ms ayuda. 5. Si est enfermo, qudese en casa a menos que su proveedor de atencin [employer] indique lo contrario. 6. Siga las instrucciones de los funcionarios estatales y nacionales con respecto a Sports administrator    At [facility] we value your feedback. You may receive a survey about your visit today. Please share your experience as we strive to create trusting relationships with our patients to provide genuine, compassionate, quality care.  We appreciate your understanding and patience as we review any laboratory studies, imaging, and other diagnostic tests that are ordered as we care for you. Our office policy is 5 business days for review of these results, and any emergent or urgent results are addressed in a timely manner for your best interest. If you do not hear from our office in 1 week, please contact us.   We also encourage the use of MyChart, which contains your medical information for your review as well. If you are not enrolled in this feature, an access code is on this after visit summary for your convenience. Thank you for allowing [place] to be involved in your care.  It was great to see you today!  I hope you have a great day!!   En [facility] valoramos sus comentarios. Puede recibir [facility] su visita hoy. Comparta su experiencia mientras nos esforzamos por crear relaciones de confianza con nuestros pacientes para brindar atencin [place], compasiva y de calidad.  Agradecemos su comprensin y [employer] estudios de laboratorio, las imgenes y Scientist, research (medical) pruebas de diagnstico que se solicitan mientras nos ocupamos de usted. La poltica de [place] oficina es de 5 das hbiles para  la revisin de estos resultados, y cualquier resultado emergente o urgente se aborda de manera oportuna para su mejor inters. Si no tiene noticias de [place] oficina en 1 semana, contctenos.  Tambin alentamos el uso de MyChart, que tambin contiene su informacin mdica para su revisin. Si no est inscrito en esta funcin,  encontrar un cdigo de acceso despus del resumen de la visita para su conveniencia. Gracias por permitirnos participar en su atencin.  Fue genial verte hoy! Espero que tengas un buen da!!

## 2018-06-24 NOTE — Assessment & Plan Note (Signed)
Denies any recent alcohol use.  No alcohol since 2014.  He is congratulated on his successes.  Recommended continue abstain from alcohol.

## 2018-06-24 NOTE — Telephone Encounter (Signed)
[place] abd RUQ scheduled for 06/28/18 at 1:30pm, arrive at 1:15pm. NPO 6 hours prior to test. Called and informed pt's friend. Letter mailed.

## 2018-06-24 NOTE — Assessment & Plan Note (Signed)
Small/grade 1 varices on EGD.  He is not currently on nonselective beta-blocker.  His platelet count has been normal likely indicating no portal hypertension.  At his next office visit in the office we can discuss utility of a follow-up EGD for surveillance of esophageal varices.  Follow-up in 6 months.

## 2018-06-28 ENCOUNTER — Other Ambulatory Visit: Payer: Self-pay

## 2018-06-28 ENCOUNTER — Ambulatory Visit (HOSPITAL_COMMUNITY)
Admission: RE | Admit: 2018-06-28 | Discharge: 2018-06-28 | Disposition: A | Discharge status: Home / Self Care | Payer: Self-pay | Source: Ambulatory Visit | Attending: Nurse Practitioner | Admitting: Nurse Practitioner

## 2018-06-28 DIAGNOSIS — K7030 Alcoholic cirrhosis of liver without ascites: Secondary | ICD-10-CM | POA: Insufficient documentation

## 2018-06-28 DIAGNOSIS — F1011 Alcohol abuse, in remission: Secondary | ICD-10-CM | POA: Insufficient documentation

## 2018-06-28 DIAGNOSIS — I8510 Secondary esophageal varices without bleeding: Secondary | ICD-10-CM | POA: Insufficient documentation

## 2018-07-01 ENCOUNTER — Other Ambulatory Visit (HOSPITAL_COMMUNITY)
Admission: RE | Admit: 2018-07-01 | Discharge: 2018-07-01 | Disposition: A | Discharge status: Home / Self Care | Payer: Self-pay | Source: Ambulatory Visit | Attending: Nurse Practitioner | Admitting: Nurse Practitioner

## 2018-07-01 DIAGNOSIS — I8510 Secondary esophageal varices without bleeding: Secondary | ICD-10-CM | POA: Insufficient documentation

## 2018-07-01 DIAGNOSIS — K7030 Alcoholic cirrhosis of liver without ascites: Secondary | ICD-10-CM | POA: Insufficient documentation

## 2018-07-01 DIAGNOSIS — R1011 Right upper quadrant pain: Secondary | ICD-10-CM | POA: Insufficient documentation

## 2018-07-01 LAB — CBC WITH DIFFERENTIAL/PLATELET
Abs Immature Granulocytes: 0.03 10*3/uL (ref 0.00–0.07)
Basophils Absolute: 0 10*3/uL (ref 0.0–0.1)
Basophils Relative: 0 %
Eosinophils Absolute: 0.1 10*3/uL (ref 0.0–0.5)
Eosinophils Relative: 2 %
HCT: 47.8 % (ref 39.0–52.0)
Hemoglobin: 16.3 g/dL (ref 13.0–17.0)
Immature Granulocytes: 0 %
Lymphocytes Relative: 29 %
Lymphs Abs: 2.7 10*3/uL (ref 0.7–4.0)
MCH: 29.8 pg (ref 26.0–34.0)
MCHC: 34.1 g/dL (ref 30.0–36.0)
MCV: 87.4 fL (ref 80.0–100.0)
Monocytes Absolute: 0.8 10*3/uL (ref 0.1–1.0)
Monocytes Relative: 9 %
Neutro Abs: 5.6 10*3/uL (ref 1.7–7.7)
Neutrophils Relative %: 60 %
Platelets: 237 10*3/uL (ref 150–400)
RBC: 5.47 MIL/uL (ref 4.22–5.81)
RDW: 12.4 % (ref 11.5–15.5)
WBC: 9.3 10*3/uL (ref 4.0–10.5)
nRBC: 0 % (ref 0.0–0.2)

## 2018-07-01 LAB — COMPREHENSIVE METABOLIC PANEL
ALT: 43 U/L (ref 0–44)
AST: 27 U/L (ref 15–41)
Albumin: 4.3 g/dL (ref 3.5–5.0)
Alkaline Phosphatase: 67 U/L (ref 38–126)
Anion gap: 6 (ref 5–15)
BUN: 11 mg/dL (ref 6–20)
CO2: 26 mmol/L (ref 22–32)
Calcium: 9.3 mg/dL (ref 8.9–10.3)
Chloride: 107 mmol/L (ref 98–111)
Creatinine, Ser: 0.97 mg/dL (ref 0.61–1.24)
GFR calc Af Amer: >60 mL/min (ref >60)
GFR calc non Af Amer: >60 mL/min (ref >60)
Glucose, Bld: 73 mg/dL (ref 70–99)
Potassium: 3.7 mmol/L (ref 3.5–5.1)
Sodium: 139 mmol/L (ref 135–145)
Total Bilirubin: 0.8 mg/dL (ref 0.3–1.2)
Total Protein: 7.9 g/dL (ref 6.5–8.1)

## 2018-07-01 LAB — PROTIME-INR
INR: 1.1 (ref 0.8–1.2)
Prothrombin Time: 13.8 seconds (ref 11.4–15.2)

## 2018-07-02 LAB — AFP TUMOR MARKER: AFP, Serum, Tumor Marker: 2.2 ng/mL (ref 0.0–8.3)

## 2018-07-22 NOTE — Progress Notes (Signed)
Pt was notified via Administrator, sports, [place] # [id number].

## 2018-07-22 NOTE — Progress Notes (Signed)
I called Spanish Interpreter # [phone] and spoke to [place]. She called and informed pt.  He will call if any questions or concerns.

## 2018-07-23 ENCOUNTER — Ambulatory Visit: Payer: Self-pay | Attending: Internal Medicine

## 2018-07-23 ENCOUNTER — Other Ambulatory Visit: Payer: Self-pay

## 2018-12-25 ENCOUNTER — Encounter: Payer: Self-pay | Admitting: Gastroenterology

## 2018-12-25 ENCOUNTER — Encounter: Payer: Self-pay | Admitting: Nurse Practitioner

## 2018-12-25 ENCOUNTER — Other Ambulatory Visit: Payer: Self-pay

## 2018-12-25 ENCOUNTER — Ambulatory Visit (INDEPENDENT_AMBULATORY_CARE_PROVIDER_SITE_OTHER): Payer: Self-pay | Admitting: Nurse Practitioner

## 2018-12-25 ENCOUNTER — Other Ambulatory Visit (HOSPITAL_COMMUNITY)
Admission: RE | Admit: 2018-12-25 | Discharge: 2018-12-25 | Disposition: A | Discharge status: Home / Self Care | Payer: Self-pay | Source: Ambulatory Visit | Attending: Nurse Practitioner | Admitting: Nurse Practitioner

## 2018-12-25 VITALS — BP 121/81 | HR 85 | Temp 97.3°F | Ht 68.0 in | Wt 186.0 lb

## 2018-12-25 DIAGNOSIS — I8510 Secondary esophageal varices without bleeding: Secondary | ICD-10-CM

## 2018-12-25 DIAGNOSIS — K7030 Alcoholic cirrhosis of liver without ascites: Secondary | ICD-10-CM | POA: Insufficient documentation

## 2018-12-25 DIAGNOSIS — F1011 Alcohol abuse, in remission: Secondary | ICD-10-CM | POA: Insufficient documentation

## 2018-12-25 LAB — CBC WITH DIFFERENTIAL/PLATELET
Abs Immature Granulocytes: 0.03 10*3/uL (ref 0.00–0.07)
Basophils Absolute: 0 10*3/uL (ref 0.0–0.1)
Basophils Relative: 0 %
Eosinophils Absolute: 0.2 10*3/uL (ref 0.0–0.5)
Eosinophils Relative: 2 %
HCT: 51.2 % (ref 39.0–52.0)
Hemoglobin: 17.3 g/dL — ABNORMAL HIGH (ref 13.0–17.0)
Immature Granulocytes: 0 %
Lymphocytes Relative: 24 %
Lymphs Abs: 2.3 10*3/uL (ref 0.7–4.0)
MCH: 30.2 pg (ref 26.0–34.0)
MCHC: 33.8 g/dL (ref 30.0–36.0)
MCV: 89.5 fL (ref 80.0–100.0)
Monocytes Absolute: 0.8 10*3/uL (ref 0.1–1.0)
Monocytes Relative: 8 %
Neutro Abs: 6.4 10*3/uL (ref 1.7–7.7)
Neutrophils Relative %: 66 %
Platelets: 267 10*3/uL (ref 150–400)
RBC: 5.72 MIL/uL (ref 4.22–5.81)
RDW: 12.7 % (ref 11.5–15.5)
WBC: 9.7 10*3/uL (ref 4.0–10.5)
nRBC: 0 % (ref 0.0–0.2)

## 2018-12-25 LAB — COMPREHENSIVE METABOLIC PANEL
ALT: 43 U/L (ref 0–44)
AST: 31 U/L (ref 15–41)
Albumin: 4.5 g/dL (ref 3.5–5.0)
Alkaline Phosphatase: 64 U/L (ref 38–126)
Anion gap: 7 (ref 5–15)
BUN: 14 mg/dL (ref 6–20)
CO2: 22 mmol/L (ref 22–32)
Calcium: 9.3 mg/dL (ref 8.9–10.3)
Chloride: 107 mmol/L (ref 98–111)
Creatinine, Ser: 0.88 mg/dL (ref 0.61–1.24)
GFR calc Af Amer: >60 mL/min (ref >60)
GFR calc non Af Amer: >60 mL/min (ref >60)
Glucose, Bld: 97 mg/dL (ref 70–99)
Potassium: 3.9 mmol/L (ref 3.5–5.1)
Sodium: 136 mmol/L (ref 135–145)
Total Bilirubin: 1.2 mg/dL (ref 0.3–1.2)
Total Protein: 8.2 g/dL — ABNORMAL HIGH (ref 6.5–8.1)

## 2018-12-25 LAB — PROTIME-INR
INR: 1.1 (ref 0.8–1.2)
Prothrombin Time: 13.6 seconds (ref 11.4–15.2)

## 2018-12-25 NOTE — Assessment & Plan Note (Signed)
History of alcoholic cirrhosis.  Generally well compensated disease with meld score around 7.  He has not had any alcohol since 2014.  Denies any overt hepatic or GI symptoms.  At this point we will recheck labs and imaging.  He is also due for updated endoscopy.  Return for follow-up in 6 months.

## 2018-12-25 NOTE — Patient Instructions (Addendum)
English:  Your health issues we discussed today were:   Cirrhosis due to previous alcohol and esophageal varices (swollen blood vessels in your swallowing tube): 1. Have your labs checked when you are able to 2. We will help schedule your ultrasound of your liver 3. We will schedule an upper endoscopy to evaluate the swollen blood vessels in your esophagus (swallowing tube). 4. Call us if you have any black stools or vomiting blood 5. Call us for any yellowing of your skin/eyes, real dark urine, sudden confusion.  Overall I recommend:  1. Return for follow-up in 6 months 2. Call us if you have any questions or concerns   Spanish:  Sus problemas de salud que discutimos hoy fueron:  Cirrosis debido a alcohol previo y vrices esofgicas (vasos sanguneos inflamados en su tubo de deglucin): 1. Haga que revisen sus laboratorios cuando pueda 2. Le ayudaremos a programar su ecografa de su hgado 3. Programaremos una endoscopia superior para evaluar los vasos sanguneos inflamados en su esfago (tubo de deglucin). 4. Llmanos si tienes heces negras o vomitas sangre. 5. Llmenos por cualquier color amarillento de su piel / ojos, orina muy oscura, confusin repentina.  En general recomiendo: 1. Regrese para seguimiento en 6 meses 2. Llmanos si tienes alguna pregunta o inquietud.    Because of recent events of COVID-19 ("Coronavirus"), follow CDC recommendations:  1. Wash your hand frequently 2. Avoid touching your face 3. Stay away from people who are sick 4. If you have symptoms such as fever, cough, shortness of breath then call your healthcare provider for further guidance 5. If you are sick, STAY AT HOME unless otherwise directed by your healthcare provider. 6. Follow directions from state and national officials regarding staying safe   At [facility] we value your feedback. You may receive a survey about your visit today. Please share your experience as we  strive to create trusting relationships with our patients to provide genuine, compassionate, quality care.  We appreciate your understanding and patience as we review any laboratory studies, imaging, and other diagnostic tests that are ordered as we care for you. Our office policy is 5 business days for review of these results, and any emergent or urgent results are addressed in a timely manner for your best interest. If you do not hear from our office in 1 week, please contact us.   We also encourage the use of MyChart, which contains your medical information for your review as well. If you are not enrolled in this feature, an access code is on this after visit summary for your convenience. Thank you for allowing [place] to be involved in your care.  It was great to see you today!  I hope you have a Happy Thanksgiving!!

## 2018-12-25 NOTE — Assessment & Plan Note (Signed)
History of alcohol abuse including alcoholic hepatitis and alcoholic cirrhosis.  He has continue to abstain from alcohol.  No alcohol since 2014.  He was commended on his continued abstinence.  Recommend he continue to avoid all alcohol.

## 2018-12-25 NOTE — Progress Notes (Addendum)
REVIEWED-NO ADDITIONAL RECOMMENDATIONS.  Referring Provider: Ladell Pier, MD Primary Care Physician:  Ladell Pier, MD Primary GI:  Dr. Oneida Alar  Chief Complaint  Patient presents with  . Cirrhosis    f/u.    HPI:   Danny Arnold is a 43 y.o. male who presents for follow-up on cirrhosis.  The patient was last seen in our office [id number] for alcoholic cirrhosis, esophageal varices, history of alcohol abuse.  His last visit was a virtual office visit due to COVID-19/coronavirus pandemic.  History of esophageal varices with EGD up-to-date 2018 noted grade 1 varices and mild gastritis/duodenitis.  Alcoholic cirrhosis generally well compensated with a meld score typically around 7.  No alcohol since 2014.  A previous right upper quadrant ultrasound at the end of 2019 found a decreased attenuation mass in the left lobe of the liver measuring 1.9 x 2.9 x 1.9 cm concerning for possible neoplasia development and recommended MRI follow-up.  MRI completed 12/28/2017 found stable appearance of the liver without suspicious focal finding in segment 2 enhancing subcapsular lesion again likely hemangioma and sparing of steatosis.  At his last visit he was accompanied by a friend who served as Optometrist for him after giving verbal consent for this.  Doing okay overall.  Denies any overt GI or hepatic symptoms.  Not on any medications.  Recommended updated labs and imaging.  Discussed possible need for repeat EGD at next office visit.  Follow-up in 6 months.  Labs updated 07/01/2018 with CBC normal and a platelet count of 237, INR normal at 1.1, LFTs completely normal, AFP normal.  Right upper quadrant ultrasound completed 06/28/2018 found decrease in size of previously identified mass felt to represent benign hemangioma, diffuse hepatic steatosis and/or hepatocellular disease with no new or suspicious masses or lesions, normal gallbladder.  Today he is accompanied by a Nurse, learning disability.  Today he states he's doing well overall. Denies abdominal pain, N/V, hematochezia, melena, fever, chills, unintentional weight loss. Denies yellowing of skin/eyes, darkened urine, acute episodic confusion, generalized pruritis, tremors. Denies URI or flu-like symptoms. Denies loss of sense of taste or smell. Denies chest pain, dyspnea, dizziness, lightheadedness, syncope, near syncope. Denies any other upper or lower GI symptoms.  Not currently on any medications.  Past Medical History:  Diagnosis Date  . Alcoholic hepatitis   . Alcoholic liver disease ([place])   . Anemia   . Ascites 02/03/2013  . Bacteremia     Past Surgical History:  Procedure Laterality Date  . COLONOSCOPY N/A 11/25/2012   IH  . ESOPHAGOGASTRODUODENOSCOPY N/A 11/25/2012   GRADE 1 VARICES  . ESOPHAGOGASTRODUODENOSCOPY N/A 04/20/2016   Procedure: ESOPHAGOGASTRODUODENOSCOPY (EGD);  Surgeon: Danie Binder, MD;  Location: [facility];  Service: Endoscopy;  Laterality: N/A;  8:30am  . HAND SURGERY    . HEMORRHOID BANDING  2014 x1  . UMBILICAL HERNIA REPAIR N/A 08/30/2015   Procedure: UMBILICAL HERNIORRHAPHY WITH MESH;  Surgeon: Aviva Signs, MD;  Location: [facility];  Service: General;  Laterality: N/A;    No current outpatient medications on file.   No current facility-administered medications for this visit.     Allergies as of 12/25/2018  . (No Known Allergies)    Family History  Problem Relation Age of Onset  . Migraines Mother   . Colon cancer Neg Hx     Social History   Socioeconomic History  . Marital status: Single    Spouse name: Not on file  . Number of children: Not on  file  . Years of education: Not on file  . Highest education level: Not on file  Occupational History  . Not on file  Social Needs  . Financial resource strain: Not on file  . Food insecurity    Worry: Not on file    Inability: Not on file  . Transportation needs    Medical: Not on file    Non-medical: Not on  file  Tobacco Use  . Smoking status: Former Research scientist (life sciences)  . Smokeless tobacco: Never Used  . Tobacco comment: Quit smoking more than a year ago  Substance and Sexual Activity  . Alcohol use: No    Alcohol/week: 0.0 standard drinks    Comment: heavy etoh until 02/2012- none since 2014.  . Drug use: No  . Sexual activity: Not on file  Lifestyle  . Physical activity    Days per week: Not on file    Minutes per session: Not on file  . Stress: Not on file  Relationships  . Social Herbalist on phone: Not on file    Gets together: Not on file    Attends religious service: Not on file    Active member of club or organization: Not on file    Attends meetings of clubs or organizations: Not on file    Relationship status: Not on file  Other Topics Concern  . 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    Review of Systems: General: Negative for anorexia, weight loss, fever, chills, fatigue, weakness. ENT: Negative for hoarseness, difficulty swallowing. CV: Negative for chest pain, angina, palpitations, peripheral edema.  Respiratory: Negative for dyspnea at rest, cough, sputum, wheezing.  GI: See history of present illness. Derm: Negative for rash or itching.  Neuro: Negative for memory loss, confusion.  Endo: Negative for unusual weight change.  Heme: Negative for bruising or bleeding. Allergy: Negative for rash or hives.   Physical Exam: BP 121/81   Pulse 85   Temp (!) 97.3 F (36.3 C) (Oral)   Ht 5\' 8"  (1.727 m)   Wt 186 lb (84.4 kg)   BMI 28.28 kg/m  General:   Alert and oriented. Pleasant and cooperative. Well-nourished and well-developed.  Eyes:  Without icterus, sclera clear and conjunctiva pink.  Ears:  Normal auditory acuity. Cardiovascular:  S1, S2 present without murmurs appreciated. Extremities without clubbing or edema. Respiratory:  Clear to auscultation bilaterally. No wheezes, rales, or rhonchi. No  distress.  Gastrointestinal:  +BS, soft, non-tender and non-distended. No HSM noted. No guarding or rebound. No masses appreciated.  Rectal:  Deferred  Musculoskalatal:  Symmetrical without gross deformities. Neurologic:  Alert and oriented x4;  grossly normal neurologically. Psych:  Alert and cooperative. Normal mood and affect. Heme/Lymph/Immune: No excessive bruising noted.    12/25/2018 1:27 PM   Disclaimer: This note was dictated with voice recognition software. Similar sounding words can inadvertently be transcribed and may not be corrected upon review.

## 2018-12-25 NOTE — Assessment & Plan Note (Addendum)
Noted grade 1 esophageal varices in 2014 and again in 2018.  Platelet count remains normal.  Is been 2 years since his last endoscopic evaluation and we will schedule him for variceal surveillance.  Return for follow-up in 6 months.  Recommended he call us immediately for any melena or hematemesis.  Proceed with EGD on propofol/MAC with Dr. Oneida Alar in near future: the risks, benefits, and alternatives have been discussed with the patient in detail. The patient states understanding and desires to proceed.  Patient is not currently on any medications.  History of chronic alcohol abuse although no alcohol since 2014.  We will plan for the procedure on propofol/MAC to promote adequate sedation.

## 2018-12-26 LAB — AFP TUMOR MARKER: AFP, Serum, Tumor Marker: 2.1 ng/mL (ref 0.0–8.3)

## 2018-12-30 ENCOUNTER — Other Ambulatory Visit: Payer: Self-pay

## 2018-12-30 ENCOUNTER — Ambulatory Visit (HOSPITAL_COMMUNITY)
Admission: RE | Admit: 2018-12-30 | Discharge: 2018-12-30 | Disposition: A | Discharge status: Home / Self Care | Payer: Self-pay | Source: Ambulatory Visit | Attending: Nurse Practitioner | Admitting: Nurse Practitioner

## 2018-12-30 DIAGNOSIS — F1011 Alcohol abuse, in remission: Secondary | ICD-10-CM | POA: Insufficient documentation

## 2018-12-30 DIAGNOSIS — I8510 Secondary esophageal varices without bleeding: Secondary | ICD-10-CM | POA: Insufficient documentation

## 2018-12-30 DIAGNOSIS — K7030 Alcoholic cirrhosis of liver without ascites: Secondary | ICD-10-CM | POA: Insufficient documentation

## 2019-04-01 NOTE — Patient Instructions (Signed)
Instrucciones Para Antes de la Ciruga   Su ciruga est programada para-(your procedure is scheduled on)  04/08/2019 at 62 AM   [place]     Por favor llame al [phone] si tiene algn problema en la maana de la ciruga. (please call if you have any problems the morning of surgery.)                  Recuerde: (Remember)   Follow the diet instructions given to you from Dr Oneida Alar office.   Tome estas medicinas en la maana de la ciruga con un SORBITO de agua (take these meds the morning of surgery with a SIP of water) None   Puede cepillarse los dientes en la maana de la [place]. (you may brush your teeth the morning of surgery)   No use joyas, maquillaje de ojos, lpiz labial, crema para el cuerpo o esmalte de uas oscuro. (Do not wear jewelry, eye makeup, lipstick, body lotion, or dark fingernail polish)   No puede usar desodorante. (you may wear deodorant)   Si va a ser ingresado despues de la ciruga, deje la maleta en el carro hasta que se le haya asignado una habitacin. (If you are to be admitted after surgery, leave suitcase in car until your room has been assigned.)   A los pacientes que se les d de alta el mismo da no se les permitir manejar a casa.  (Patients discharged on the day of surgery will not be allowed to drive home)   Use ropa suelta y cmoda de regreso a casa. (wear loose comfortable clothes for ride home)     No Fumar 04/08/2019 before you come.  Endoscopa alta en los adultos, cuidados posteriores Upper Endoscopy, Adult, Care After Esta hoja le brinda informacin sobre cmo cuidarse despus del procedimiento. El mdico tambin podr darle indicaciones ms especficas. Comunquese con el mdico si tiene problemas o preguntas. Qu puedo esperar despus del procedimiento? Despus del procedimiento, es comn [employer] siguientes sntomas:  Dolor de Investment banker, operational.  Leve dolor o molestias en el  estmago.  Meteorismo.  Nuseas. Siga estas indicaciones en su casa:   Siga las indicaciones del mdico respecto de qu comer o beber despus del procedimiento.  Retome sus actividades normales como se lo haya indicado el mdico. Pregntele al mdico qu actividades son seguras para usted.  Tome los medicamentos de venta libre y los recetados solamente como se lo haya indicado el mdico.  No conduzca durante 24horas si le administraron un sedante durante el procedimiento.  Concurra a todas las visitas de seguimiento como se lo haya indicado el mdico. Esto es importante. Comunquese con un mdico si tiene:  Dolor de garganta que dura ms de Optician, dispensing.  Dificultad para tragar. Solicite ayuda inmediatamente si:  Vomita sangre o el vmito tiene un aspecto similar al poso del caf.  Tiene los siguientes sntomas: ? Cristy Hilts. ? Heces con sangre o de aspecto negro alquitranado. ? Dolor de garganta muy intenso o no puede tragar. ? Dificultad para respirar. ? Dolorintenso en el pecho o el abdomen. Resumen  Despus del procedimiento, es frecuente sentir dolor de garganta, molestias leves en el estmago, distensin abdominal y nuseas.  No conduzca  durante 24horas si le administraron un sedante durante el procedimiento.  Siga las indicaciones del mdico respecto de qu comer o beber despus del procedimiento.  Retome sus actividades normales como se lo haya indicado el mdico. Esta informacin no tiene Marine scientist el consejo del mdico. Asegrese de hacerle al mdico cualquier pregunta que tenga. Document Revised: 08/09/2017 Document Reviewed: 08/09/2017 Elsevier Patient Education  2020 [place], cuidados posteriores Monitored Anesthesia Care, Care After Estas indicaciones le proporcionan informacin acerca de cmo deber cuidarse despus de su procedimiento. El mdico tambin podr darle instrucciones ms especficas. Su tratamiento ha sido  planificado segn las prcticas mdicas actuales, Armed forces training and education officer en algunos casos pueden ocurrir problemas. Comunquese con el mdico si tiene algn problema o preguntas despus del procedimiento. Qu puedo esperar despus del procedimiento? Despus del procedimiento, puede:  Sentirse somnoliento durante varias horas.  Sentirse torpe y [employer] de equilibrio durante varias horas.  Olvidarse de lo que sucedi despus del procedimiento.  Perder el sentido de la realidad durante varias horas.  Sentirse nauseoso o vomitar.  Tener dolor de garganta si le colocaron un tubo respiratorio durante el procedimiento. Siga estas indicaciones en su casa: Durante al menos 24horas despus del procedimiento:      Pdale a un adulto responsable que permanezca con usted. Es importante que alguien cuide de usted hasta que se despierte y Cabin crew.  Descanse todo lo que sea necesario.  No haga lo siguiente: ? Participar en actividades en las que podra caerse o lastimarse. ? Conducir. ? Operar maquinarias pesadas. ? Beber alcohol. ? Tomar somnferos o medicamentos que causen somnolencia. ? Firmar documentos legales ni tomar [employer]. ? Cuidar a nios por su cuenta. Qu debe comer y beber  Siga la dieta recomendada por el mdico.  Si vomita, tome agua, jugo o sopa cuando pueda beber sin vomitar.  Asegrese de no tener nuseas antes de ingerir alimentos slidos. Instrucciones generales  [employer] de venta libre y los recetados solamente como se lo haya indicado el mdico.  Si tiene apnea del sueo, la [place] y ciertos medicamentos pueden aumentar el riesgo de problemas respiratorios. Siga las indicaciones del mdico respecto al uso de su dispositivo para dormir: ? Siempre que duerma, incluso durante las siestas que tome en el da. ? Mientras tome analgsicos recetados, medicamentos para dormir o medicamentos que producen somnolencia.  Si fuma, no lo haga sin  supervisin.  Concurra a todas las visitas de seguimiento como se lo haya indicado el mdico. Esto es importante. Comunquese con un mdico si:  Sigue teniendo nuseas o vomitando.  Siente que va a desvanecerse.  Presenta una erupcin cutnea.  Tiene fiebre. Solicite ayuda de inmediato si:  Tiene dificultad para respirar. Resumen  Tal vez se sienta adormecido y pierda el sentido de la realidad durante varias horas despus del procedimiento.  Pdale a un adulto responsable que permanezca con usted al menos por 24 horas o hasta que est despierto y consciente. Esta informacin no tiene Marine scientist el consejo del mdico. Asegrese de hacerle al mdico cualquier pregunta que tenga. Document Revised: 04/30/2017 Document Reviewed: 05/23/2015 Elsevier Patient Education  [place].

## 2019-04-04 ENCOUNTER — Encounter (HOSPITAL_COMMUNITY)
Admission: RE | Admit: 2019-04-04 | Discharge: 2019-04-04 | Disposition: A | Discharge status: Home / Self Care | Payer: Self-pay | Source: Ambulatory Visit | Attending: Gastroenterology | Admitting: Gastroenterology

## 2019-04-04 ENCOUNTER — Other Ambulatory Visit (HOSPITAL_COMMUNITY)
Admission: RE | Admit: 2019-04-04 | Discharge: 2019-04-04 | Disposition: A | Discharge status: Home / Self Care | Payer: HRSA Program | Source: Ambulatory Visit | Attending: Gastroenterology | Admitting: Gastroenterology

## 2019-04-04 ENCOUNTER — Other Ambulatory Visit: Payer: Self-pay

## 2019-04-04 ENCOUNTER — Encounter (HOSPITAL_COMMUNITY): Payer: Self-pay

## 2019-04-04 DIAGNOSIS — Z20822 Contact with and (suspected) exposure to covid-19: Secondary | ICD-10-CM | POA: Insufficient documentation

## 2019-04-04 DIAGNOSIS — Z01812 Encounter for preprocedural laboratory examination: Secondary | ICD-10-CM | POA: Insufficient documentation

## 2019-04-04 LAB — CBC WITH DIFFERENTIAL/PLATELET
Abs Immature Granulocytes: 0.03 10*3/uL (ref 0.00–0.07)
Basophils Absolute: 0 10*3/uL (ref 0.0–0.1)
Basophils Relative: 0 %
Eosinophils Absolute: 0.1 10*3/uL (ref 0.0–0.5)
Eosinophils Relative: 2 %
HCT: 52.1 % — ABNORMAL HIGH (ref 39.0–52.0)
Hemoglobin: 17.6 g/dL — ABNORMAL HIGH (ref 13.0–17.0)
Immature Granulocytes: 0 %
Lymphocytes Relative: 26 %
Lymphs Abs: 2.4 10*3/uL (ref 0.7–4.0)
MCH: 30.4 pg (ref 26.0–34.0)
MCHC: 33.8 g/dL (ref 30.0–36.0)
MCV: 90 fL (ref 80.0–100.0)
Monocytes Absolute: 0.7 10*3/uL (ref 0.1–1.0)
Monocytes Relative: 8 %
Neutro Abs: 6 10*3/uL (ref 1.7–7.7)
Neutrophils Relative %: 64 %
Platelets: 257 10*3/uL (ref 150–400)
RBC: 5.79 MIL/uL (ref 4.22–5.81)
RDW: 12.7 % (ref 11.5–15.5)
WBC: 9.4 10*3/uL (ref 4.0–10.5)
nRBC: 0 % (ref 0.0–0.2)

## 2019-04-04 LAB — COMPREHENSIVE METABOLIC PANEL
ALT: 33 U/L (ref 0–44)
AST: 19 U/L (ref 15–41)
Albumin: 4.2 g/dL (ref 3.5–5.0)
Alkaline Phosphatase: 61 U/L (ref 38–126)
Anion gap: 11 (ref 5–15)
BUN: 14 mg/dL (ref 6–20)
CO2: 19 mmol/L — ABNORMAL LOW (ref 22–32)
Calcium: 9 mg/dL (ref 8.9–10.3)
Chloride: 106 mmol/L (ref 98–111)
Creatinine, Ser: 1.12 mg/dL (ref 0.61–1.24)
GFR calc Af Amer: >60 mL/min (ref >60)
GFR calc non Af Amer: >60 mL/min (ref >60)
Glucose, Bld: 101 mg/dL — ABNORMAL HIGH (ref 70–99)
Potassium: 3.7 mmol/L (ref 3.5–5.1)
Sodium: 136 mmol/L (ref 135–145)
Total Bilirubin: 0.9 mg/dL (ref 0.3–1.2)
Total Protein: 7.6 g/dL (ref 6.5–8.1)

## 2019-04-04 LAB — PROTIME-INR
INR: 1 (ref 0.8–1.2)
Prothrombin Time: 13.2 seconds (ref 11.4–15.2)

## 2019-04-05 LAB — SARS CORONAVIRUS 2 (TAT 6-24 HRS): SARS Coronavirus 2: NEGATIVE

## 2019-04-08 ENCOUNTER — Encounter (HOSPITAL_COMMUNITY)
Admission: RE | Disposition: A | Discharge status: Home / Self Care | Payer: Self-pay | Source: Ambulatory Visit | Attending: Gastroenterology

## 2019-04-08 ENCOUNTER — Telehealth: Payer: Self-pay | Admitting: Gastroenterology

## 2019-04-08 ENCOUNTER — Ambulatory Visit (HOSPITAL_COMMUNITY): Payer: Self-pay | Admitting: Anesthesiology

## 2019-04-08 ENCOUNTER — Ambulatory Visit (HOSPITAL_COMMUNITY)
Admission: RE | Admit: 2019-04-08 | Discharge: 2019-04-08 | Disposition: A | Discharge status: Home / Self Care | Payer: Self-pay | Source: Ambulatory Visit | Attending: Gastroenterology | Admitting: Gastroenterology

## 2019-04-08 ENCOUNTER — Encounter (HOSPITAL_COMMUNITY): Payer: Self-pay | Admitting: Gastroenterology

## 2019-04-08 DIAGNOSIS — K2970 Gastritis, unspecified, without bleeding: Secondary | ICD-10-CM | POA: Insufficient documentation

## 2019-04-08 DIAGNOSIS — K7460 Unspecified cirrhosis of liver: Secondary | ICD-10-CM | POA: Insufficient documentation

## 2019-04-08 DIAGNOSIS — Z87891 Personal history of nicotine dependence: Secondary | ICD-10-CM | POA: Insufficient documentation

## 2019-04-08 DIAGNOSIS — K2980 Duodenitis without bleeding: Secondary | ICD-10-CM | POA: Insufficient documentation

## 2019-04-08 DIAGNOSIS — I8510 Secondary esophageal varices without bleeding: Secondary | ICD-10-CM | POA: Insufficient documentation

## 2019-04-08 HISTORY — PX: ESOPHAGOGASTRODUODENOSCOPY (EGD) WITH PROPOFOL: SHX5813

## 2019-04-08 HISTORY — PX: ESOPHAGEAL BANDING: SHX5518

## 2019-04-08 SURGERY — ESOPHAGOGASTRODUODENOSCOPY (EGD) WITH PROPOFOL
Anesthesia: General

## 2019-04-08 MED ORDER — LIDOCAINE VISCOUS HCL 2 % MT SOLN: 5.0000 mL | Freq: Once | OROMUCOSAL | Status: DC

## 2019-04-08 MED ORDER — HYDROCODONE-ACETAMINOPHEN 7.5-325 MG PO TABS: 1.0000 | ORAL_TABLET | Freq: Once | ORAL | Status: DC | PRN

## 2019-04-08 MED ORDER — PROPOFOL 10 MG/ML IV BOLUS
INTRAVENOUS | Status: DC | PRN
  Administered 2019-04-08: 20 mg via INTRAVENOUS
  Administered 2019-04-08: 40 mg via INTRAVENOUS

## 2019-04-08 MED ORDER — LACTATED RINGERS IV SOLN: INTRAVENOUS | Status: DC

## 2019-04-08 MED ORDER — PROPOFOL 500 MG/50ML IV EMUL
INTRAVENOUS | Status: DC | PRN
  Administered 2019-04-08: 150 ug/kg/min via INTRAVENOUS

## 2019-04-08 MED ORDER — PROMETHAZINE HCL 25 MG/ML IJ SOLN: 6.2500 mg | INTRAMUSCULAR | Status: DC | PRN

## 2019-04-08 MED ORDER — PROPOFOL 10 MG/ML IV BOLUS
INTRAVENOUS | Status: AC
  Filled 2019-04-08: qty 40

## 2019-04-08 MED ORDER — KETAMINE HCL 10 MG/ML IJ SOLN
INTRAMUSCULAR | Status: DC | PRN
  Administered 2019-04-08: 20 mg via INTRAVENOUS

## 2019-04-08 MED ORDER — HYDROMORPHONE HCL 1 MG/ML IJ SOLN: 0.2500 mg | INTRAMUSCULAR | Status: DC | PRN

## 2019-04-08 MED ORDER — MIDAZOLAM HCL 2 MG/2ML IJ SOLN: 0.5000 mg | Freq: Once | INTRAMUSCULAR | Status: DC | PRN

## 2019-04-08 MED ORDER — OMEPRAZOLE 20 MG PO CPDR: DELAYED_RELEASE_CAPSULE | ORAL | 3 refills | Status: DC

## 2019-04-08 MED ORDER — KETAMINE HCL 50 MG/5ML IJ SOSY
PREFILLED_SYRINGE | INTRAMUSCULAR | Status: AC
  Filled 2019-04-08: qty 5

## 2019-04-08 MED ORDER — LIDOCAINE VISCOUS HCL 2 % MT SOLN
OROMUCOSAL | Status: AC
  Filled 2019-04-08: qty 15

## 2019-04-08 MED ORDER — PROPOFOL 10 MG/ML IV BOLUS
INTRAVENOUS | Status: AC
  Filled 2019-04-08: qty 20

## 2019-04-08 MED ORDER — LIDOCAINE VISCOUS HCL 2 % MT SOLN
OROMUCOSAL | Status: DC | PRN
  Administered 2019-04-08: 5 mL via OROMUCOSAL

## 2019-04-08 NOTE — Anesthesia Procedure Notes (Signed)
Date/Time: 04/08/2019 1:13 PM Performed by: Vista Deck, CRNA Pre-anesthesia Checklist: Patient identified, Emergency Drugs available, Suction available, Timeout performed and Patient being monitored Patient Re-evaluated:Patient Re-evaluated prior to induction Oxygen Delivery Method: Non-rebreather mask

## 2019-04-08 NOTE — Anesthesia Preprocedure Evaluation (Addendum)
Anesthesia Evaluation  Patient identified by MRN, date of birth, ID band Patient awake    Reviewed: Allergy & Precautions, NPO status , Patient's Chart, lab work & pertinent test results  Airway Mallampati: II  TM Distance: >3 FB Neck ROM: Full    Dental no notable dental hx. (+) Teeth Intact   Pulmonary neg pulmonary ROS, former smoker,    Pulmonary exam normal breath sounds clear to auscultation       Cardiovascular Exercise Tolerance: Good negative cardio ROS Normal cardiovascular examI Rhythm:Regular Rate:Normal     Neuro/Psych negative neurological ROS  negative psych ROS   GI/Hepatic negative GI ROS, (+) Cirrhosis       , Hepatitis -, Toxin RelatedH/o Etoh hepatitis  States NO Etoh in 6 years  Here for EGD   Endo/Other  negative endocrine ROS  Renal/GU negative Renal ROS  negative genitourinary   Musculoskeletal negative musculoskeletal ROS (+)   Abdominal   Peds negative pediatric ROS (+)  Hematology negative hematology ROS (+) Latest H/H 17/52   Anesthesia Other Findings   Reproductive/Obstetrics negative OB ROS                            Anesthesia Physical Anesthesia Plan  ASA: II  Anesthesia Plan: General   Post-op Pain Management:    Induction: Intravenous  PONV Risk Score and Plan: 2 and Propofol infusion and TIVA  Airway Management Planned: Nasal Cannula and Simple Face Mask  Additional Equipment:   Intra-op Plan:   Post-operative Plan:   Informed Consent: I have reviewed the patients History and Physical, chart, labs and discussed the procedure including the risks, benefits and alternatives for the proposed anesthesia with the patient or authorized representative who has indicated his/her understanding and acceptance.     Dental advisory given  Plan Discussed with: CRNA  Anesthesia Plan Comments: (Plan Full PPE use  Plan GA with GETA as needed d/w  pt -WTP with same after Q&A)        Anesthesia Quick Evaluation

## 2019-04-08 NOTE — Telephone Encounter (Signed)
PT AVOIDING ETOH-CHILD PUGH A. REPEAT EGD IN FEB 2024, [id number] 1 ESOPHAGEAL VARICES.Marland Kitchen

## 2019-04-08 NOTE — Op Note (Signed)
[facility] Patient Name: Danny Arnold Procedure Date: 04/08/2019 12:41 PM MRN: [id number] Date of Birth: [birth year] Attending MD: Barney Drain MD, MD CSN: [id number] Age: 44 Admit Type: Outpatient Procedure:                Upper GI endoscopy, DIAGNOSTIC Indications:              1st degree variceal surveillance (known small                            varices, no prior bleeding) Providers:                Barney Drain MD, MD, Charlsie Quest. Theda Sers RN, RN,                            Ralene Bathe, Technician, Randa Spike, Technician Referring MD:             Ladell Pier Medicines:                Propofol per Anesthesia Complications:            No immediate complications. Estimated Blood Loss:     Estimated blood loss: none. Procedure:                Pre-Anesthesia Assessment:                           - Prior to the procedure, a History and Physical                            was performed, and patient medications and                            allergies were reviewed. The patient's tolerance of                            previous anesthesia was also reviewed. The risks                            and benefits of the procedure and the sedation                            options and risks were discussed with the patient.                            All questions were answered, and informed consent                            was obtained. Prior Anticoagulants: The patient has                            taken no previous anticoagulant or antiplatelet                            agents. ASA Grade Assessment: II - A patient with  mild systemic disease. After reviewing the risks                            and benefits, the patient was deemed in                            satisfactory condition to undergo the procedure.                            After obtaining informed consent, the endoscope was                            passed under direct  vision. Throughout the                            procedure, the patient's blood pressure, pulse, and                            oxygen saturations were monitored continuously. The                            [id number]) was introduced through the                            mouth, and advanced to the second part of duodenum.                            The upper GI endoscopy was accomplished without                            difficulty. The patient tolerated the procedure                            well. Scope In: 1:24:30 PM Scope Out: 1:27:23 PM Total Procedure Duration: 0 hours 2 minutes 53 seconds  Findings:      Grade I varices were found in the lower third of the esophagus.      Localized mild inflammation characterized by congestion (edema),       erosions and erythema was found in the gastric body and in the gastric       antrum.      Diffuse moderate inflammation characterized by congestion (edema),       erosions and erythema was found in the entire duodenum. Impression:               - Grade I esophageal varices.                           - MODERATE Gastritis & Duodenitis. Moderate Sedation:      Per Anesthesia Care Recommendation:           - Patient has a contact number available for                            emergencies. The signs and symptoms of potential  delayed complications were discussed with the                            patient. Return to normal activities tomorrow.                            Written discharge instructions were provided to the                            patient.                           - Resume previous diet.                           - Continue present medications. ADD OMEPRAZOLE                            DAILY WITH BREAKFAST                           - Await pathology results.                           - Repeat upper endoscopy in 3 years for                            surveillance.                            - Return to GI office in 6 months. Procedure Code(s):        --- Professional ---                           [phone], Esophagogastroduodenoscopy, flexible,                            transoral; diagnostic, including collection of                            specimen(s) by brushing or washing, when performed                            (separate procedure) Diagnosis Code(s):        --- Professional ---                           I85.00, Esophageal varices without bleeding                           K29.70, Gastritis, unspecified, without bleeding                           K29.80, Duodenitis without bleeding CPT copyright 2019 American Medical Association. All rights reserved. The codes documented in this report are preliminary and upon coder review may  be revised to meet current compliance requirements. Barney Drain, MD Barney Drain MD, MD 04/08/2019 2:09:14 PM  This report has been signed electronically. Number of Addenda: 0

## 2019-04-08 NOTE — H&P (Addendum)
Primary Care Physician:  Ladell Pier, MD Primary Gastroenterologist:  Dr. Oneida Alar  Pre-Procedure History & Physical: HPI:  Danny Arnold is a 44 y.o. male here for [place]. SPANISH SPEAKING ONLY-HISTORY OBTAINED VIA INTERPRETER: [id number].  Past Medical History:  Diagnosis Date  . Alcoholic hepatitis   . Alcoholic liver disease ([place])   . Anemia   . Ascites 02/03/2013  . Bacteremia     Past Surgical History:  Procedure Laterality Date  . COLONOSCOPY N/A 11/25/2012   IH  . ESOPHAGOGASTRODUODENOSCOPY N/A 11/25/2012   GRADE 1 VARICES  . ESOPHAGOGASTRODUODENOSCOPY N/A 04/20/2016   Procedure: ESOPHAGOGASTRODUODENOSCOPY (EGD);  Surgeon: Danie Binder, MD;  [facility];  Service: Endoscopy;  Laterality: N/A;  8:30am  . HAND SURGERY Left    removal of foreign body and tendon repair.  Marland Kitchen HEMORRHOID BANDING  2014 x1  . UMBILICAL HERNIA REPAIR N/A 08/30/2015   Procedure: UMBILICAL HERNIORRHAPHY WITH MESH;  Surgeon: Aviva Signs, MD;  Location: [facility];  Service: General;  Laterality: N/A;    Prior to Admission medications   Not on File    Allergies as of 12/25/2018  . (No Known Allergies)    Family History  Problem Relation Age of Onset  . Migraines Mother   . Colon cancer Neg Hx     Social History   Socioeconomic History  . Marital status: Single    Spouse name: Not on file  . Number of children: Not on file  . Years of education: Not on file  . Highest education level: Not on file  Occupational History  . Not on file  Tobacco Use  . Smoking status: Former Smoker    Packs/day: 0.25    Years: 15.00    Pack years: 3.75    Types: Cigarettes    Quit date: 04/03/2012    Years since quitting: 7.0  . Smokeless tobacco: Never Used  . Tobacco comment: Quit smoking more than a year ago  Substance and Sexual Activity  . Alcohol use: No    Alcohol/week: 0.0 standard drinks    Comment: heavy etoh until 02/2012- none since 2014.  .  Drug use: No  . Sexual activity: Yes  Other Topics Concern  . 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   Social Determinants of Health   Financial Resource Strain:   . Difficulty of Paying Living Expenses: Not on file  Food Insecurity:   . Worried About Charity fundraiser in the Last Year: Not on file  . Ran Out of Food in the Last Year: Not on file  Transportation Needs:   . Lack of Transportation (Medical): Not on file  . Lack of Transportation (Non-Medical): Not on file  Physical Activity:   . Days of Exercise per Week: Not on file  . Minutes of Exercise per Session: Not on file  Stress:   . Feeling of Stress : Not on file  Social Connections:   . Frequency of Communication with Friends and Family: Not on file  . Frequency of Social Gatherings with Friends and Family: Not on file  . Attends Religious Services: Not on file  . Active Member of Clubs or Organizations: Not on file  . Attends Archivist Meetings: Not on file  . Marital Status: Not on file  Intimate Partner Violence:   . Fear of Current or Ex-Partner: Not on file  . Emotionally  Abused: Not on file  . Physically Abused: Not on file  . Sexually Abused: Not on file    Review of Systems: See HPI, otherwise negative ROS   Physical Exam: BP 124/90 (BP Location: Right Arm)   Temp 97.8 F (36.6 C)   Resp 16   SpO2 97%  General:   Alert,  pleasant and cooperative in NAD Head:  Normocephalic and atraumatic. Neck:  Supple; Lungs:  Clear throughout to auscultation.    Heart:  Regular rate and rhythm. Abdomen:  Soft, nontender and nondistended. Normal bowel sounds, without guarding, and without rebound.   Neurologic:  Alert and  oriented x4;  grossly normal neurologically.  Impression/Plan:     SCREENING FOR VARICES  PLAN:  1.EGD TODAY.  DISCUSSED PROCEDURE, BENEFITS, & RISKS: < 1% chance of medication reaction, bleeding, perforation, or  ASPIRATION.

## 2019-04-08 NOTE — Anesthesia Postprocedure Evaluation (Signed)
Anesthesia Post Note  Patient: Danny Arnold  Procedure(s) Performed: ESOPHAGOGASTRODUODENOSCOPY (EGD) WITH PROPOFOL (N/A ) ESOPHAGEAL BANDING (N/A )  Patient location during evaluation: Phase II Anesthesia Type: General Level of consciousness: awake and alert and patient cooperative Pain management: satisfactory to patient Vital Signs Assessment: post-procedure vital signs reviewed and stable Respiratory status: spontaneous breathing Cardiovascular status: stable Postop Assessment: no apparent nausea or vomiting Anesthetic complications: no     Last Vitals:  Vitals:   04/08/19 1335 04/08/19 1345  BP: 106/74 110/78  Pulse: 82 99  Resp: (!) 25 (!) 21  Temp: (!) 36.3 C   SpO2: 96% 97%    Last Pain:  Vitals:   04/08/19 1335  PainSc: 0-No pain                 Sheketa Ende

## 2019-04-08 NOTE — Telephone Encounter (Signed)
Please NIC 

## 2019-04-08 NOTE — Transfer of Care (Signed)
Immediate Anesthesia Transfer of Care Note  Patient: Dartanyon Guitierres Bautista  Procedure(s) Performed: ESOPHAGOGASTRODUODENOSCOPY (EGD) WITH PROPOFOL (N/A ) ESOPHAGEAL BANDING (N/A )  Patient Location: PACU  Anesthesia Type:General  Level of Consciousness: awake and patient cooperative  Airway & Oxygen Therapy: Patient Spontanous Breathing  Post-op Assessment: Report given to RN and Post -op Vital signs reviewed and stable  Post vital signs: Reviewed and stable  Last Vitals:  Vitals Value Taken Time  BP 106/74 04/08/19 1333  Temp 97.4   Pulse 85 04/08/19 1333  Resp 19 04/08/19 1333  SpO2 96 % 04/08/19 1333  Vitals shown include unvalidated device data.  Last Pain:  Vitals:   04/08/19 1315  PainSc: 0-No pain         Complications: No apparent anesthesia complications

## 2019-04-08 NOTE — Discharge Instructions (Signed)
Tiene vrices esofgicas DEBIDO A CICATRIZACIONES EN EL HGADO. Tiene gastritis y duodenitis.    PARA TRATAR LA GASTRITIS Y LA DUODENITIS, INICIE OMEPRAZOLE. TOME [place] DE SU [place].  SEGUIMIENTO EN 6 MOS.  REPETIR LA ENDOSCOPIA SUPERIOR EN 3 AOS.   You have esophageal varices DUE TO SCARRING IN YOUR LIVER. You have gastritis and duodenitis.   TO TREAT GASTRITIS, and DUODENITIS, START OMEPRAZOLE.  TAKE 30 MINUTES PRIOR TO YOUR FIRST MEAL.  FOLLOW UP IN 6 MOS.   REPEAT UPPER ENDOSCOPY IN 3 YEARS.      UPPER ENDOSCOPY AFTER CARE Read the instructions outlined below and refer to this sheet in the next week. These discharge instructions provide you with general information on caring for yourself after you leave the hospital. While your treatment has been planned according to the most current medical practices available, unavoidable complications occasionally occur. If you have any problems or questions after discharge, call DR. Shantrell Placzek, [phone].  ACTIVITY  You may resume your regular activity, but move at a slower pace for the next 24 hours.   Take frequent rest periods for the next 24 hours.   Walking will help get rid of the air and reduce the bloated feeling in your belly (abdomen).   No driving for 24 hours (because of the medicine (anesthesia) used during the test).   You may shower.   Do not sign any important legal documents or operate any machinery for 24 hours (because of the anesthesia used during the test).    NUTRITION  Drink plenty of fluids.   You may resume your normal diet as instructed by your doctor.   Begin with a light meal and progress to your normal diet. Heavy or fried foods are harder to digest and may make you feel sick to your stomach (nauseated).   Avoid alcoholic beverages for 24 hours or as instructed.    MEDICATIONS  You may resume your normal medications.   WHAT YOU CAN EXPECT TODAY  Some feelings of  bloating in the abdomen.   Passage of more gas than usual.    IF YOU HAD A BIOPSY TAKEN DURING THE UPPER ENDOSCOPY:  Eat a soft diet IF YOU HAVE NAUSEA, BLOATING, ABDOMINAL PAIN, OR VOMITING.    FINDING OUT THE RESULTS OF YOUR TEST Not all test results are available during your visit. DR. Oneida Alar WILL CALL YOU WITHIN 7 DAYS OF YOUR PROCEDUE WITH YOUR RESULTS. Do not assume everything is normal if you have not heard from DR. Karra Pink IN ONE WEEK, CALL HER OFFICE AT [phone].  SEEK IMMEDIATE MEDICAL ATTENTION AND CALL THE OFFICE: [phone] IF:  You have more than a spotting of blood in your stool.   Your belly is swollen (abdominal distention).   You are nauseated or vomiting.   You have a temperature over 101F.   You have abdominal pain or discomfort that is severe or gets worse throughout the day.

## 2019-04-09 NOTE — Telephone Encounter (Signed)
ON RECALL  °

## 2019-06-24 ENCOUNTER — Ambulatory Visit: Payer: Self-pay | Admitting: Nurse Practitioner

## 2019-08-05 ENCOUNTER — Encounter: Payer: Self-pay | Admitting: Internal Medicine

## 2019-08-05 ENCOUNTER — Other Ambulatory Visit: Payer: Self-pay

## 2019-08-05 ENCOUNTER — Ambulatory Visit: Payer: Self-pay | Attending: Internal Medicine | Admitting: Internal Medicine

## 2019-08-05 VITALS — BP 124/86 | HR 80 | Temp 97.7°F | Resp 16 | Ht 67.0 in | Wt 174.0 lb

## 2019-08-05 DIAGNOSIS — Z7189 Other specified counseling: Secondary | ICD-10-CM

## 2019-08-05 DIAGNOSIS — K7030 Alcoholic cirrhosis of liver without ascites: Secondary | ICD-10-CM

## 2019-08-05 DIAGNOSIS — M7711 Lateral epicondylitis, right elbow: Secondary | ICD-10-CM | POA: Insufficient documentation

## 2019-08-05 DIAGNOSIS — M222X1 Patellofemoral disorders, right knee: Secondary | ICD-10-CM

## 2019-08-05 DIAGNOSIS — M7712 Lateral epicondylitis, left elbow: Secondary | ICD-10-CM

## 2019-08-05 DIAGNOSIS — K2960 Other gastritis without bleeding: Secondary | ICD-10-CM

## 2019-08-05 DIAGNOSIS — M222X2 Patellofemoral disorders, left knee: Secondary | ICD-10-CM | POA: Insufficient documentation

## 2019-08-05 DIAGNOSIS — I8510 Secondary esophageal varices without bleeding: Secondary | ICD-10-CM | POA: Insufficient documentation

## 2019-08-05 MED ORDER — DICLOFENAC SODIUM 1 % EX GEL: 2.0000 g | Freq: Four times a day (QID) | CUTANEOUS | 1 refills | Status: DC

## 2019-08-05 MED ORDER — OMEPRAZOLE 20 MG PO CPDR: DELAYED_RELEASE_CAPSULE | ORAL | 3 refills | Status: DC

## 2019-08-05 NOTE — Patient Instructions (Signed)
Codo de [place] Tennis Elbow El codo de tenista es la hinchazn (inflamacin) en la zona externa del Joycie Peek, cerca del codo. La hinchazn afecta los tejidos que conectan el msculo al hueso (tendones). Esta afeccin puede presentarse si practica un deporte o realiza una tarea que exige el uso excesivo del codo. La causa del codo de [place] es la repeticin del mismo movimiento una y Elmon Kirschner. El codo de [place] puede causar lo siguiente:  Dolor y sensibilidad a la palpacin en el Management consultant y la parte externa del codo. Puede sentir dolor todo Physiological scientist o solo cuando utiliza el brazo.  Sensacin de ardor. Esta se extiende desde el codo hasta el brazo.  Agarre dbil en la mano. Siga estas instrucciones en su casa: Actividad  Descanse el codo y la [place]. Evite las actividades que causen [place], como se lo haya indicado el mdico.  Si se lo indica el mdico, use una correa para codo para reducir la presin sobre la zona.  Haga los ejercicios de fisioterapia como se lo hayan indicado.  Si levanta un objeto, hgalo con la palma de la mano [place]. [employer], se reduce el esfuerzo en el codo. Estilo de vida  Si el codo de [place] se debe a los deportes, revise el equipo y Chief Strategy Officer de lo siguiente: ? Que lo [place] correctamente. ? Que es apto para usted.  Si el codo de [place] se debe a su trabajo o al uso de una computadora, tmese descansos con frecuencia para Publishing rights manager. Hable con su jefe de personal sobre cmo puede controlar su afeccin en el trabajo. Si tiene un dispositivo ortopdico:  selo como se lo haya indicado el mdico. Quteselo solamente como se lo haya indicado el mdico.  Afloje el dispositivo ortopdico si los dedos se le adormecen, siente hormigueos o se le enfran y se tornan de Optician, dispensing.  Mantenga limpio el dispositivo ortopdico.  Si el dispositivo ortopdico no es impermeable, consulte a su mdico si puede quitarse el dispositivo para baarse. Si  debe tener puesto el dispositivo ortopdico mientras se baa: ? No deje que se moje. ? Cbralo con un envoltorio hermtico cuando tome un bao de inmersin o [place]. Instrucciones generales   Si se lo indican, aplique hielo sobre la zona dolorida: ? Field seismologist hielo en una bolsa plstica. ? Coloque una [employer] piel y Therapist, nutritional. ? Coloque el hielo durante 20 minutos, 2 a 3 veces por da.  Tome los medicamentos de venta libre y los recetados solamente como se lo haya indicado el mdico.  Consulting civil engineer a todas las visitas de seguimiento como se lo haya indicado el mdico. Esto es importante. Comunquese con un mdico si:  El dolor no mejora con Dispensing optician.  El dolor [place].  Tiene debilidad en el antebrazo, la mano o los dedos de la [place].  No puede sentir el antebrazo, la mano o los dedos de la [place]. Resumen  El codo de tenista es la hinchazn (inflamacin) en la zona externa del Joycie Peek, cerca del codo.  Su causa es la repeticin del mismo movimiento una y Elmon Kirschner.  Descanse el codo y la [place]. Evite las actividades que causen [place], como se lo haya indicado el mdico.  Si se lo indican, aplique hielo sobre la zona dolorida durante 20 minutos, de 2 a 3 veces por Training and development officer. Esta informacin no tiene Marine scientist el consejo del mdico. Asegrese de hacerle al mdico cualquier pregunta que tenga.  Document Revised: 11/30/2017 Document Reviewed: 11/30/2017 Elsevier Patient Education  2020 [employer].

## 2019-08-05 NOTE — Progress Notes (Signed)
Patient ID: Danny Arnold, male    DOB: [birth year]  MRN: [id number]  CC: re-establish and Fatigue   Subjective: Danny Arnold is a 44 y.o. male who presents to re-est care.  Last seen 11/2017 His concerns today include:  Hx of ETOH cirrhosis, esophageal varices  Pt has remained free of ETOH for past few yrs Followed by GI Dr. Barney Drain.  Had EGD 03/2019 - found to have Grade 1 esophageal varies in lower 1/3 of esophagus and moderate gastritis and duodenitis.  Plan for repeat EGD in 3 yrs.  Prescription sent to pharmacy for Prilosec. He never picked it up.  Would like for me to send rxn to pharmacy today.  Not on any NSAIDs.  Denies any symptoms of acid reflux  -c/o feeling of "tiredness" in elbow and knee jts.  Started in elbows x 2 wks ago and knees 4 mths. -no swelling in knees, no stiffness.  Pain in knees when he walks up steps or bends down.  He hears cracking noises in the knees when he walks up steps.  Walks about 2-3 times a wk exercise.  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  Does not use knee pads -elbows bother him with a lot of bending of the jts.  Works as a Brewing technologist x 20 yrs  HM:  Did not get COVID vaccine as yet but would like to get it if recommended.   Patient Active Problem List   Diagnosis Date Noted  . Constipation 12/21/2017  . Elevated blood pressure reading 12/04/2017  . History of alcohol abuse 06/19/2017  . Gastritis and gastroduodenitis   . Umbilical hernia 94/17/4081  . Esophageal varices in alcoholic cirrhosis ([place]) 44/81/8563  . Alcoholic hepatitis 14/97/0263  . Cirrhosis, alcoholic ([place]) 78/58/8502     No current outpatient medications on file prior to visit.   No current facility-administered medications on file prior to visit.    No Known Allergies  Social History   Socioeconomic History  . Marital  status: Single    Spouse name: Not on file  . Number of children: Not on file  . Years of education: Not on file  . Highest education level: Not on file  Occupational History  . Not on file  Tobacco Use  . Smoking status: Former Smoker    Packs/day: 0.25    Years: 15.00    Pack years: 3.75    Types: Cigarettes    Quit date: 04/03/2012    Years since quitting: 7.3  . Smokeless tobacco: Never Used  . Tobacco comment: Quit smoking more than a year ago  Vaping Use  . Vaping Use: Never used  Substance and Sexual Activity  . Alcohol use: No    Alcohol/week: 0.0 standard drinks    Comment: heavy etoh until 02/2012- none since 2014.  . Drug use: No  . Sexual activity: Yes  Other Topics Concern  . 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   Social Determinants of Health   Financial Resource Strain:   . Difficulty of Paying Living Expenses:   Food Insecurity:   . Worried About Charity fundraiser in the Last Year:   . Arboriculturist in the Last Year:   Transportation Needs:   . Film/video editor (Medical):   Marland Kitchen  Lack of Transportation (Non-Medical):   Physical Activity:   . Days of Exercise per Week:   . Minutes of Exercise per Session:   Stress:   . Feeling of Stress :   Social Connections:   . Frequency of Communication with Friends and Family:   . Frequency of Social Gatherings with Friends and Family:   . Attends Religious Services:   . Active Member of Clubs or Organizations:   . Attends Archivist Meetings:   Marland Kitchen Marital Status:   Intimate Partner Violence:   . Fear of Current or Ex-Partner:   . Emotionally Abused:   Marland Kitchen Physically Abused:   . Sexually Abused:     Family History  Problem Relation Age of Onset  . Migraines Mother   . Colon cancer Neg Hx     Past Surgical History:  Procedure Laterality Date  . COLONOSCOPY N/A 11/25/2012   IH  . ESOPHAGEAL BANDING N/A 04/08/2019   Procedure:  ESOPHAGEAL BANDING;  Surgeon: Danie Binder, MD;  [facility];  Service: Endoscopy;  Laterality: N/A;  . ESOPHAGOGASTRODUODENOSCOPY N/A 11/25/2012   GRADE 1 VARICES  . ESOPHAGOGASTRODUODENOSCOPY N/A 04/20/2016   Procedure: ESOPHAGOGASTRODUODENOSCOPY (EGD);  Surgeon: Danie Binder, MD;  [facility];  Service: Endoscopy;  Laterality: N/A;  8:30am  . ESOPHAGOGASTRODUODENOSCOPY (EGD) WITH PROPOFOL N/A 04/08/2019   Procedure: ESOPHAGOGASTRODUODENOSCOPY (EGD) WITH PROPOFOL;  Surgeon: Danie Binder, MD;  [facility];  Service: Endoscopy;  Laterality: N/A;  1:00pm  . HAND SURGERY Left    removal of foreign body and tendon repair.  Marland Kitchen HEMORRHOID BANDING  2014 x1  . UMBILICAL HERNIA REPAIR N/A 08/30/2015   Procedure: UMBILICAL HERNIORRHAPHY WITH MESH;  Surgeon: Aviva Signs, MD;  Location: [facility];  Service: General;  Laterality: N/A;    ROS: Review of Systems Negative except as stated above  PHYSICAL EXAM: BP 124/86   Pulse 80   Temp 97.7 F (36.5 C)   Resp 16   Ht 5\' 7"  (1.702 m)   Wt 174 lb (78.9 kg)   SpO2 98%   BMI 27.25 kg/m   Physical Exam General appearance - alert, well appearing, middle-aged Hispanic male and in no distress Mental status - normal mood, behavior, speech, dress, motor activity, and thought processes Chest - clear to auscultation, no wheezes, rales or rhonchi, symmetric air entry Heart - normal rate, regular rhythm, normal S1, S2, no murmurs, rubs, clicks or gallops Musculoskeletal -elbows: No edema or erythema.  He has mild point tenderness on palpation of the lateral epicondyles bilaterally.  He has good range of motion.  Power in the upper extremities 5/5 bilaterally. Knee: No edema or erythema of the knees.  No point tenderness.  Good passive range of motion.   Extremities - peripheral pulses normal, no pedal edema, no clubbing or cyanosis  CMP Latest Ref Rng & Units 04/04/2019 12/25/2018 07/01/2018  Glucose 70 - 99 mg/dL  101(H) 97 73  BUN 6 - 20 mg/dL 14 14 11   Creatinine 0.61 - 1.24 mg/dL 1.12 0.88 0.97  Sodium 135 - 145 mmol/L 136 136 139  Potassium 3.5 - 5.1 mmol/L 3.7 3.9 3.7  Chloride 98 - 111 mmol/L 106 107 107  CO2 22 - 32 mmol/L 19(L) 22 26  Calcium 8.9 - 10.3 mg/dL 9.0 9.3 9.3  Total Protein 6.5 - 8.1 g/dL 7.6 8.2(H) 7.9  Total Bilirubin 0.3 - 1.2 mg/dL 0.9 1.2 0.8  Alkaline Phos 38 - 126 U/L 61  64 67  AST 15 - 41 U/L 19 31 27   ALT 0 - 44 U/L 33 43 43   Lipid Panel  No results found for: CHOL, TRIG, HDL, CHOLHDL, VLDL, LDLCALC, LDLDIRECT  CBC    Component Value Date/Time   WBC 9.4 04/04/2019 1427   RBC 5.79 04/04/2019 1427   HGB 17.6 (H) 04/04/2019 1427   HCT 52.1 (H) 04/04/2019 1427   PLT 257 04/04/2019 1427   MCV 90.0 04/04/2019 1427   MCH 30.4 04/04/2019 1427   MCHC 33.8 04/04/2019 1427   RDW 12.7 04/04/2019 1427   LYMPHSABS 2.4 04/04/2019 1427   MONOABS 0.7 04/04/2019 1427   EOSABS 0.1 04/04/2019 1427   BASOSABS 0.0 04/04/2019 1427    ASSESSMENT AND PLAN:  1. Lateral epicondylitis of both elbows Discussed diagnosis.  Most likely due to the type of work that he does recommend taking several days of work if needed for joint rest.  He has esophageal varices so I opted not to put him on oral NSAIDs.  Will use Voltaren gel instead.  Recommended getting elbow sleeves for support - diclofenac Sodium (VOLTAREN) 1 % GEL; Apply 2 g topically 4 (four) times daily.  Dispense: 100 g; Refill: 1  2. Patellofemoral pain syndrome of both knees Recommend that he purchase and wears kneepads when having to work on his knees.  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  Dispense: 100 g; Refill: 1  3. Alcoholic cirrhosis of liver without ascites ([place]) Commended him on remaining free of alcohol.  4. Secondary esophageal varices without bleeding ([place]) Followed by GI.  I have refilled  the omeprazole  5. Educated about COVID-19 virus infection Discussed and encourage getting the COVID-19 vaccine.  Patient informed that the vaccines are relatively safe and effective  6. Other gastritis without hemorrhage, unspecified chronicity - omeprazole (PRILOSEC) 20 MG capsule; 1 PO 30 MINS PRIOR TO BREAKFAST.  Dispense: 90 capsule; Refill: 3    Patient was given the opportunity to ask questions.  Patient verbalized understanding of the plan and was able to repeat key elements of the plan.  [employer] interpreter used during this encounter. #[id number]  No orders of the defined types were placed in this encounter.    Requested Prescriptions   Signed Prescriptions Disp Refills  . diclofenac Sodium (VOLTAREN) 1 % GEL 100 g 1    Sig: Apply 2 g topically 4 (four) times daily.  Marland Kitchen omeprazole (PRILOSEC) 20 MG capsule 90 capsule 3    Sig: 1 PO 30 MINS PRIOR TO BREAKFAST.    Return if symptoms worsen or fail to improve.  Karle Plumber, MD, FACP

## 2019-08-11 ENCOUNTER — Ambulatory Visit: Payer: Self-pay

## 2019-08-11 ENCOUNTER — Other Ambulatory Visit: Payer: Self-pay

## 2019-09-24 ENCOUNTER — Encounter: Payer: Self-pay | Admitting: Internal Medicine

## 2019-09-24 ENCOUNTER — Encounter: Payer: Self-pay | Admitting: *Deleted

## 2019-09-24 ENCOUNTER — Ambulatory Visit (INDEPENDENT_AMBULATORY_CARE_PROVIDER_SITE_OTHER): Payer: Self-pay | Admitting: Nurse Practitioner

## 2019-09-24 ENCOUNTER — Encounter: Payer: Self-pay | Admitting: Nurse Practitioner

## 2019-09-24 ENCOUNTER — Other Ambulatory Visit (HOSPITAL_COMMUNITY)
Admission: RE | Admit: 2019-09-24 | Discharge: 2019-09-24 | Disposition: A | Discharge status: Home / Self Care | Payer: Self-pay | Source: Ambulatory Visit | Attending: Nurse Practitioner | Admitting: Nurse Practitioner

## 2019-09-24 ENCOUNTER — Other Ambulatory Visit: Payer: Self-pay

## 2019-09-24 VITALS — BP 122/86 | HR 85 | Temp 97.5°F | Ht 67.0 in | Wt 171.6 lb

## 2019-09-24 DIAGNOSIS — K7030 Alcoholic cirrhosis of liver without ascites: Secondary | ICD-10-CM

## 2019-09-24 DIAGNOSIS — K2970 Gastritis, unspecified, without bleeding: Secondary | ICD-10-CM

## 2019-09-24 DIAGNOSIS — K2990 Gastroduodenitis, unspecified, without bleeding: Secondary | ICD-10-CM | POA: Insufficient documentation

## 2019-09-24 DIAGNOSIS — I8510 Secondary esophageal varices without bleeding: Secondary | ICD-10-CM | POA: Insufficient documentation

## 2019-09-24 LAB — CBC WITH DIFFERENTIAL/PLATELET
Abs Immature Granulocytes: 0.03 10*3/uL (ref 0.00–0.07)
Basophils Absolute: 0 10*3/uL (ref 0.0–0.1)
Basophils Relative: 0 %
Eosinophils Absolute: 0.2 10*3/uL (ref 0.0–0.5)
Eosinophils Relative: 2 %
HCT: 48.4 % (ref 39.0–52.0)
Hemoglobin: 16.3 g/dL (ref 13.0–17.0)
Immature Granulocytes: 0 %
Lymphocytes Relative: 22 %
Lymphs Abs: 2.1 10*3/uL (ref 0.7–4.0)
MCH: 29.7 pg (ref 26.0–34.0)
MCHC: 33.7 g/dL (ref 30.0–36.0)
MCV: 88.2 fL (ref 80.0–100.0)
Monocytes Absolute: 0.8 10*3/uL (ref 0.1–1.0)
Monocytes Relative: 8 %
Neutro Abs: 6.4 10*3/uL (ref 1.7–7.7)
Neutrophils Relative %: 68 %
Platelets: 260 10*3/uL (ref 150–400)
RBC: 5.49 MIL/uL (ref 4.22–5.81)
RDW: 12.8 % (ref 11.5–15.5)
WBC: 9.5 10*3/uL (ref 4.0–10.5)
nRBC: 0 % (ref 0.0–0.2)

## 2019-09-24 LAB — COMPREHENSIVE METABOLIC PANEL
ALT: 28 U/L (ref 0–44)
AST: 20 U/L (ref 15–41)
Albumin: 4.1 g/dL (ref 3.5–5.0)
Alkaline Phosphatase: 64 U/L (ref 38–126)
Anion gap: 8 (ref 5–15)
BUN: 14 mg/dL (ref 6–20)
CO2: 22 mmol/L (ref 22–32)
Calcium: 8.8 mg/dL — ABNORMAL LOW (ref 8.9–10.3)
Chloride: 106 mmol/L (ref 98–111)
Creatinine, Ser: 0.95 mg/dL (ref 0.61–1.24)
GFR calc Af Amer: >60 mL/min (ref >60)
GFR calc non Af Amer: >60 mL/min (ref >60)
Glucose, Bld: 101 mg/dL — ABNORMAL HIGH (ref 70–99)
Potassium: 3.9 mmol/L (ref 3.5–5.1)
Sodium: 136 mmol/L (ref 135–145)
Total Bilirubin: 0.8 mg/dL (ref 0.3–1.2)
Total Protein: 7.5 g/dL (ref 6.5–8.1)

## 2019-09-24 LAB — PROTIME-INR
INR: 1 (ref 0.8–1.2)
Prothrombin Time: 13.2 seconds (ref 11.4–15.2)

## 2019-09-24 NOTE — Progress Notes (Signed)
Referring Provider: Ladell Pier, MD Primary Care Physician:  Ladell Pier, MD Primary GI:  Dr. Abbey Chatters  Chief Complaint  Patient presents with  . Cirrhosis    doing ok    HPI:   Danny Arnold is a 44 y.o. male who presents for follow-up on ETOH cirrhosis.  The patient was last seen in our office 12/25/2018 for esophageal varices, alcoholic cirrhosis, history of alcohol abuse.  History of esophageal varices with EGD up-to-date 2018 noted grade 1 varices mild gastritis/duodenitis.  Typically well compensated disease with a meld score around 7.  No alcohol intake since 2014.  Previous right upper quadrant ultrasound with an area concerning for possible neoplasia and follow-up MRI 12/28/2017 stable appearance of the liver without suspicious focal finding, noted stable lesion likely hemangioma and sparing of steatosis.  Most recent updated labs and imaging essentially normal.  As last office visit he was doing well overall, no overt GI or hepatic symptoms.  Recommended update labs and imaging for cirrhosis care, schedule EGD for variceal surveillance, follow-up in 6 months.  EGD completed 04/08/2019 which found grade 1 esophageal varices/stable, moderate gastritis and duodenitis.  Recommended continue current medications, add omeprazole daily, repeat EGD in 3 years for variceal surveillance.  Most recent labs completed 04/04/2019 which found essentially normal CBC, CMP, PT/INR.  Previously ordered labs at last office visit also normal including normal AFP.  Meld score 8, child Pugh class A.  Right upper quadrant ultrasound completed 12/30/2018 found persistent left hepatic lobe lesion previously characterized on MRI is likely hemangioma with focal fatty sparing, no new focal lesion.  Today he states he is doing okay overall.  Today he is accompanied by a Publishing copy. Denies abdominal pain, N/V, hematochezia, melena, fever, chills, unintentional weight loss. He  has lost 17 lbs with concerted effort at diet and exercise. Denies any yellowing of the skin/eyes, darkened urine, acute episodic confusion, generalized tremors, generalized pruritis. Denies chest pain, dyspnea, dizziness, lightheadedness, syncope, near syncope. Denies any other upper or lower GI symptoms.  GERD well managed on PPI. He mentions libido and ED issues, thinks it may be stress and relationship problems.  Past Medical History:  Diagnosis Date  . Alcoholic hepatitis   . Alcoholic liver disease ([place])   . Anemia   . Ascites 02/03/2013  . Bacteremia     Past Surgical History:  Procedure Laterality Date  . COLONOSCOPY N/A 11/25/2012   IH  . ESOPHAGEAL BANDING N/A 04/08/2019   Procedure: ESOPHAGEAL BANDING;  Surgeon: Danie Binder, MD;  [facility];  Service: Endoscopy;  Laterality: N/A;  . ESOPHAGOGASTRODUODENOSCOPY N/A 11/25/2012   GRADE 1 VARICES  . ESOPHAGOGASTRODUODENOSCOPY N/A 04/20/2016   Procedure: ESOPHAGOGASTRODUODENOSCOPY (EGD);  Surgeon: Danie Binder, MD;  [facility];  Service: Endoscopy;  Laterality: N/A;  8:30am  . ESOPHAGOGASTRODUODENOSCOPY (EGD) WITH PROPOFOL N/A 04/08/2019   Procedure: ESOPHAGOGASTRODUODENOSCOPY (EGD) WITH PROPOFOL;  Surgeon: Danie Binder, MD;  [facility];  Service: Endoscopy;  Laterality: N/A;  1:00pm  . HAND SURGERY Left    removal of foreign body and tendon repair.  Marland Kitchen HEMORRHOID BANDING  2014 x1  . UMBILICAL HERNIA REPAIR N/A 08/30/2015   Procedure: UMBILICAL HERNIORRHAPHY WITH MESH;  Surgeon: Aviva Signs, MD;  Location: [facility];  Service: General;  Laterality: N/A;    Current Outpatient Medications  Medication Sig Dispense Refill  . omeprazole (PRILOSEC) 20 MG capsule 1 PO 30 MINS PRIOR TO BREAKFAST. 90 capsule 3  No current facility-administered medications for this visit.    Allergies as of 09/24/2019  . (No Known Allergies)    Family History  Problem Relation Age of Onset  . Migraines  Mother   . Colon cancer Neg Hx     Social History   Socioeconomic History  . Marital status: Single    Spouse name: Not on file  . Number of children: Not on file  . Years of education: Not on file  . Highest education level: Not on file  Occupational History  . Not on file  Tobacco Use  . Smoking status: Former Smoker    Packs/day: 0.25    Years: 15.00    Pack years: 3.75    Types: Cigarettes    Quit date: 04/03/2012    Years since quitting: 7.4  . Smokeless tobacco: Never Used  . Tobacco comment: Quit smoking more than a year ago  Vaping Use  . Vaping Use: Never used  Substance and Sexual Activity  . Alcohol use: No    Alcohol/week: 0.0 standard drinks    Comment: As of 09/24/19: heavy etoh until 02/2012- none since 2014.  . Drug use: No  . Sexual activity: Yes  Other Topics Concern  . 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   Social Determinants of Health   Financial Resource Strain:   . Difficulty of Paying Living Expenses:   Food Insecurity:   . Worried About Charity fundraiser in the Last Year:   . Arboriculturist in the Last Year:   Transportation Needs:   . Film/video editor (Medical):   Marland Kitchen Lack of Transportation (Non-Medical):   Physical Activity:   . Days of Exercise per Week:   . Minutes of Exercise per Session:   Stress:   . Feeling of Stress :   Social Connections:   . Frequency of Communication with Friends and Family:   . Frequency of Social Gatherings with Friends and Family:   . Attends Religious Services:   . Active Member of Clubs or Organizations:   . Attends Archivist Meetings:   Marland Kitchen Marital Status:     Subjective: Review of Systems  Constitutional: Negative for chills, fever, malaise/fatigue and weight loss.  HENT: Negative for congestion and sore throat.   Respiratory: Negative for cough and shortness of breath.   Cardiovascular: Negative for chest pain and  palpitations.  Gastrointestinal: Negative for abdominal pain, blood in stool, diarrhea, melena, nausea and vomiting.  Musculoskeletal: Negative for joint pain and myalgias.  Skin: Negative for rash.  Neurological: Negative for dizziness and weakness.  Endo/Heme/Allergies: Does not bruise/bleed easily.  Psychiatric/Behavioral: Negative for depression. The patient is not nervous/anxious.   All other systems reviewed and are negative.    Objective: BP 122/86   Pulse 85   Temp (!) 97.5 F (36.4 C) (Temporal)   Ht 5\' 7"  (1.702 m)   Wt 171 lb 9.6 oz (77.8 kg)   BMI 26.88 kg/m  Physical Exam Vitals and nursing note reviewed.  Constitutional:      General: He is not in acute distress.    Appearance: Normal appearance. He is normal weight. He is not ill-appearing, toxic-appearing or diaphoretic.  HENT:     Head: Normocephalic and atraumatic.     Nose: No congestion or rhinorrhea.  Eyes:     General: No scleral icterus. Cardiovascular:     Rate  and Rhythm: Normal rate and regular rhythm.     Heart sounds: Normal heart sounds.  Pulmonary:     Effort: Pulmonary effort is normal.     Breath sounds: Normal breath sounds.  Abdominal:     General: Bowel sounds are normal. There is no distension.     Palpations: Abdomen is soft. There is no hepatomegaly, splenomegaly or mass.     Tenderness: There is no abdominal tenderness. There is no guarding or rebound.     Hernia: No hernia is present.  Musculoskeletal:     Cervical back: Neck supple.  Skin:    General: Skin is warm and dry.     Coloration: Skin is not jaundiced.     Findings: No bruising or rash.  Neurological:     General: No focal deficit present.     Mental Status: He is alert and oriented to person, place, and time. Mental status is at baseline.  Psychiatric:        Mood and Affect: Mood normal.        Behavior: Behavior normal.        Thought Content: Thought content normal.      Assessment:  Known history of  alcoholic cirrhosis, has abstained from alcohol for approximately 7 years now.  Normal platelet count, meld score has been well documented nearly normal around 8.  He does have grade 1 esophageal varices which have been pretty stable and consistent for the past several EGDs.  He is generally asymptomatic today from a GI and hepatic standpoint.  He is due for updated labs and imaging for cirrhosis care.  He has lost about 17 pounds recently through intentional diet and exercise which we discussed the overall health benefits and the health benefits to his liver.  I congratulated him and recommended he continue in his efforts.  Also noted gastritis on EGD, remains on PPI.  Notes bloating has improved since he started taking PPI.  Generally asymptomatic from a GERD standpoint.   Plan: 1. Continue to abstain from alcohol as he has been 2. Updated cirrhosis labs including CBC, CMP, INR, AFP 3. Update right upper quadrant ultrasound for hepatoma screening in addition to AFP 4. Continue current medications 5. Continue diet and exercise 6. Follow-up in 6 months      09/24/2019 3:08 PM   Disclaimer: This note was dictated with voice recognition software. Similar sounding words can inadvertently be transcribed and may not be corrected upon review.

## 2019-09-24 NOTE — Patient Instructions (Addendum)
English:  Your health issues we discussed today were:   GERD (reflux/heartburn): 1. Continue taking your current acid blocker 2. Call us if any worsening or severe symptoms  Cirrhosis due to previous alcohol and esophageal varices (swollen blood vessels in your swallowing tube): 1. Your upper endoscopy looked good 2. Have your lab test completed when you are able to 3. We will help schedule your ultrasound for you 4. Continue avoiding alcohol 5. Continue your diet and weight loss 6. Call us for any problems or symptoms  Overall I recommend:  1. Continue your other current medications 2. Return for follow-up in 6 months 3. Call us if you have any questions or concerns   At [facility] we value your feedback. You may receive a survey about your visit today. Please share your experience as we strive to create trusting relationships with our patients to provide genuine, compassionate, quality care.  We appreciate your understanding and patience as we review any laboratory studies, imaging, and other diagnostic tests that are ordered as we care for you. Our office policy is 5 business days for review of these results, and any emergent or urgent results are addressed in a timely manner for your best interest. If you do not hear from our office in 1 week, please contact us.   We also encourage the use of MyChart, which contains your medical information for your review as well. If you are not enrolled in this feature, an access code is on this after visit summary for your convenience. Thank you for allowing [place] to be involved in your care.  It was great to see you today!  I hope you have a great rest of your summer!!     Espanol:  Sus problemas de salud que discutimos hoy fueron:  ERGE (reflujo / acidez estomacal): 1. Contine tomando su bloqueador de cido actual 2. Llmanos si los sntomas empeoran o son graves.  Cirrosis debido a alcohol previo y vrices esofgicas  (vasos sanguneos inflamados en su tubo de deglucin): 1. Su endoscopia superior se vea bien 2. Complete su prueba de laboratorio cuando pueda 3. Le ayudaremos a programar su ultrasonido 4. Continuar evitando el alcohol 5. Contine con su dieta y prdida de peso. 6. Llmanos ante cualquier problema o sntoma  En general recomiendo: 1. Contine con sus otros medicamentos actuales 2. Regrese para seguimiento en 6 meses 3. Llmanos si tienes alguna pregunta o inquietud.   En [facility] valoramos sus comentarios. Es posible que reciba una encuesta sobre su visita hoy. Comparta su experiencia mientras nos esforzamos por crear relaciones de confianza con nuestros pacientes para brindarles una atencin [place], [place] y de calidad.  Agradecemos su comprensin y [employer] estudios de laboratorio, las imgenes y Scientist, research (medical) pruebas de diagnstico que se solicitan mientras lo cuidamos. La poltica de [place] oficina es de 5 das hbiles para la revisin de [place], y cualquier resultado emergente o urgente se aborda de manera oportuna para su mejor inters. Si no recibe noticias de nuestra oficina en 1 semana, comunquese con nosotros.  Tambin recomendamos el uso de MyChart, que tambin contiene su informacin mdica para su revisin. Si no est inscrito en esta funcin, encontrar un cdigo de [employer] resumen posterior a la visita para su conveniencia. Gracias por permitirnos participar en su atencin.  Fue genial verte hoy! Espero que tengas un buen descanso de tu verano !!

## 2019-09-25 LAB — AFP TUMOR MARKER: AFP, Serum, Tumor Marker: 1.7 ng/mL (ref 0.0–8.3)

## 2019-09-30 ENCOUNTER — Other Ambulatory Visit: Payer: Self-pay

## 2019-09-30 ENCOUNTER — Ambulatory Visit (HOSPITAL_COMMUNITY)
Admission: RE | Admit: 2019-09-30 | Discharge: 2019-09-30 | Disposition: A | Discharge status: Home / Self Care | Payer: Self-pay | Source: Ambulatory Visit | Attending: Nurse Practitioner | Admitting: Nurse Practitioner

## 2019-09-30 DIAGNOSIS — K2970 Gastritis, unspecified, without bleeding: Secondary | ICD-10-CM | POA: Insufficient documentation

## 2019-09-30 DIAGNOSIS — K2990 Gastroduodenitis, unspecified, without bleeding: Secondary | ICD-10-CM | POA: Insufficient documentation

## 2019-09-30 DIAGNOSIS — I8510 Secondary esophageal varices without bleeding: Secondary | ICD-10-CM | POA: Insufficient documentation

## 2019-09-30 DIAGNOSIS — K7030 Alcoholic cirrhosis of liver without ascites: Secondary | ICD-10-CM | POA: Insufficient documentation

## 2020-02-27 ENCOUNTER — Ambulatory Visit: Payer: Self-pay | Attending: Internal Medicine | Admitting: Internal Medicine

## 2020-02-27 ENCOUNTER — Other Ambulatory Visit: Payer: Self-pay

## 2020-02-27 DIAGNOSIS — M25522 Pain in left elbow: Secondary | ICD-10-CM

## 2020-02-27 DIAGNOSIS — M25521 Pain in right elbow: Secondary | ICD-10-CM

## 2020-02-27 DIAGNOSIS — M792 Neuralgia and neuritis, unspecified: Secondary | ICD-10-CM

## 2020-02-27 DIAGNOSIS — G5692 Unspecified mononeuropathy of left upper limb: Secondary | ICD-10-CM

## 2020-02-27 NOTE — Progress Notes (Signed)
Pt states he is having issues with his hands like he doesn't have any strength in them  Pt states he feels like he has cramps in his fingers   Pt states he has been having pain in b/l elbow

## 2020-02-27 NOTE — Progress Notes (Signed)
Virtual Visit via Telephone Note  I connected with Shadrick Gutierres-Bautista on 02/27/20 at  3:10 PM EST by telephone and verified that I am speaking with the correct person using two identifiers.  Location: Patient: home Provider: office  The patient, my CMA Ms. Lebron Quam, myself and interpreter Netta Corrigan from [facility] interpreters participated in this encounter. [phone]) I discussed the limitations, risks, security and privacy concerns of performing an evaluation and management service by telephone and the availability of in person appointments. I also discussed with the patient that there may be a patient responsible charge related to this service. The patient expressed understanding and agreed to proceed.   History of Present Illness: Hx of ETOH cirrhosis, esophageal varices.  Last seen 07/2019. Pt c/o feeling like he has decrease strength in both hands since June of last yr. No problems gripping objects.  No numbness in hands but feels his hands are "closing up."  Pain in elbows when he tries to lift anything heavy.  No swelling in the elbows. Dx with epicondylitis last year and I prescribed Voltaren gel.  He states that he did get the gel but did not find it too helpful.  The only thing that seems to help his hands and elbows is IcyHot rub.   Outpatient Encounter Medications as of 02/27/2020  Medication Sig  . omeprazole (PRILOSEC) 20 MG capsule 1 PO 30 MINS PRIOR TO BREAKFAST.   No facility-administered encounter medications on file as of 02/27/2020.      Observations/Objective: No direct observation done as this was a telephone encounter.  Assessment and Plan: 1. Neuropathy of hand, left 2. Neuropathic pain of right hand 3. Pain of both elbows I recommend he continues to use the IcyHot rub for now.  We will bring him in for an in person visit in 2 to 3 weeks.   Follow Up Instructions:    I discussed the assessment and treatment plan with the patient. The patient was provided  an opportunity to ask questions and all were answered. The patient agreed with the plan and demonstrated an understanding of the instructions.   The patient was advised to call back or seek an in-person evaluation if the symptoms worsen or if the condition fails to improve as anticipated.  I provided 10 minutes of non-face-to-face time during this encounter.   Karle Plumber, MD

## 2020-03-03 ENCOUNTER — Ambulatory Visit: Payer: Self-pay

## 2020-03-19 ENCOUNTER — Telehealth: Payer: Self-pay | Admitting: Internal Medicine

## 2020-03-19 NOTE — Telephone Encounter (Signed)
Copied from [place] [id number]. Topic: Appointment Scheduling - Scheduling Inquiry for Clinic >> Mar 18, 2020  4:05 PM Erick Blinks wrote: Reason for CRM: Pt would like a call back regarding his orange card application.   Best contact: [phone]

## 2020-03-19 NOTE — Telephone Encounter (Signed)
I return Pt call and schedule an appt for 03/25/20

## 2020-03-25 ENCOUNTER — Other Ambulatory Visit: Payer: Self-pay

## 2020-03-25 ENCOUNTER — Ambulatory Visit: Payer: Self-pay | Attending: Internal Medicine

## 2020-03-30 ENCOUNTER — Other Ambulatory Visit (HOSPITAL_COMMUNITY)
Admission: RE | Admit: 2020-03-30 | Discharge: 2020-03-30 | Disposition: A | Discharge status: Home / Self Care | Payer: Self-pay | Source: Ambulatory Visit | Attending: Nurse Practitioner | Admitting: Nurse Practitioner

## 2020-03-30 ENCOUNTER — Encounter: Payer: Self-pay | Admitting: Nurse Practitioner

## 2020-03-30 ENCOUNTER — Ambulatory Visit (INDEPENDENT_AMBULATORY_CARE_PROVIDER_SITE_OTHER): Payer: Self-pay | Admitting: Nurse Practitioner

## 2020-03-30 ENCOUNTER — Other Ambulatory Visit: Payer: Self-pay

## 2020-03-30 VITALS — BP 129/86 | HR 84 | Temp 97.6°F | Ht 68.0 in | Wt 171.8 lb

## 2020-03-30 DIAGNOSIS — F1011 Alcohol abuse, in remission: Secondary | ICD-10-CM

## 2020-03-30 DIAGNOSIS — I8510 Secondary esophageal varices without bleeding: Secondary | ICD-10-CM | POA: Insufficient documentation

## 2020-03-30 DIAGNOSIS — K7030 Alcoholic cirrhosis of liver without ascites: Secondary | ICD-10-CM

## 2020-03-30 LAB — COMPREHENSIVE METABOLIC PANEL
ALT: 21 U/L (ref 0–44)
AST: 20 U/L (ref 15–41)
Albumin: 4.2 g/dL (ref 3.5–5.0)
Alkaline Phosphatase: 59 U/L (ref 38–126)
Anion gap: 8 (ref 5–15)
BUN: 12 mg/dL (ref 6–20)
CO2: 22 mmol/L (ref 22–32)
Calcium: 9.2 mg/dL (ref 8.9–10.3)
Chloride: 106 mmol/L (ref 98–111)
Creatinine, Ser: 0.87 mg/dL (ref 0.61–1.24)
GFR, Estimated: >60 mL/min (ref >60)
Glucose, Bld: 96 mg/dL (ref 70–99)
Potassium: 3.8 mmol/L (ref 3.5–5.1)
Sodium: 136 mmol/L (ref 135–145)
Total Bilirubin: 1.4 mg/dL — ABNORMAL HIGH (ref 0.3–1.2)
Total Protein: 7.8 g/dL (ref 6.5–8.1)

## 2020-03-30 LAB — CBC WITH DIFFERENTIAL/PLATELET
Abs Immature Granulocytes: 0.02 10*3/uL (ref 0.00–0.07)
Basophils Absolute: 0 10*3/uL (ref 0.0–0.1)
Basophils Relative: 0 %
Eosinophils Absolute: 0.1 10*3/uL (ref 0.0–0.5)
Eosinophils Relative: 1 %
HCT: 50.2 % (ref 39.0–52.0)
Hemoglobin: 17.1 g/dL — ABNORMAL HIGH (ref 13.0–17.0)
Immature Granulocytes: 0 %
Lymphocytes Relative: 25 %
Lymphs Abs: 1.9 10*3/uL (ref 0.7–4.0)
MCH: 30.7 pg (ref 26.0–34.0)
MCHC: 34.1 g/dL (ref 30.0–36.0)
MCV: 90.1 fL (ref 80.0–100.0)
Monocytes Absolute: 0.6 10*3/uL (ref 0.1–1.0)
Monocytes Relative: 8 %
Neutro Abs: 5.2 10*3/uL (ref 1.7–7.7)
Neutrophils Relative %: 66 %
Platelets: 266 10*3/uL (ref 150–400)
RBC: 5.57 MIL/uL (ref 4.22–5.81)
RDW: 12.6 % (ref 11.5–15.5)
WBC: 7.8 10*3/uL (ref 4.0–10.5)
nRBC: 0 % (ref 0.0–0.2)

## 2020-03-30 LAB — PROTIME-INR
INR: 1.1 (ref 0.8–1.2)
Prothrombin Time: 13.5 seconds (ref 11.4–15.2)

## 2020-03-30 NOTE — Progress Notes (Signed)
Referring Provider: Ladell Pier, MD Primary Care Physician:  Ladell Pier, MD Primary GI:  Dr. Abbey Chatters  Chief Complaint  Patient presents with  . Cirrhosis    F/u    HPI:   Danny Arnold is a 45 y.o. male who presents for follow-up on cirrhosis.  The patient was last seen in our office 05/29/8307 for alcoholic cirrhosis, esophageal varices, gastritis.  History of esophageal varices with EGD up-to-date 04/08/2019 with grade 1 esophageal varices/stable, moderate gastritis and duodenitis recommend continue current medications and add omeprazole, repeat EGD in 3 years (2024).  Typically well compensated disease with a meld score around 6.  No alcohol intake since 2014.  Labs just prior to his last visit were essentially normal, as was imaging.  At his last visit doing well, accompanied by a translator.  Noted 70 pound weight loss with a concerted effort of diet and exercise.  No hepatic or GI symptoms.  GERD well managed on PPI.  Recommended continue alcohol abstinence, updated labs and imaging, continue current medications, diet, and exercise.  Follow-up in 6 months.  Last completed 09/24/2019 which found normal CBC, essentially normal CMP, normal INR 1.0, normal AFP.  Right upper quadrant ultrasound completed 09/30/2019 which found consistent with cirrhosis, stable mixed echogenicity lesion in the left lobe likely representing a hemangioma given stability in appearance on prior MRI in 2019, no new liver lesions.  Today he states doing okay overall. Denies abdominal pain, N/V, hematochezia, melena, fever, chills, unintentional weight loss. Denies any yellowing of the skin/eyes, darkened urine, acute episodic confusion, generalized tremors, generalized pruritis. He has maintained his previous weight loss through diet and exercise. GERD doing well on Prilosec daily.  Past Medical History:  Diagnosis Date  . Alcoholic hepatitis   . Alcoholic liver disease ([place])   .  Anemia   . Ascites 02/03/2013  . Bacteremia     Past Surgical History:  Procedure Laterality Date  . COLONOSCOPY N/A 11/25/2012   IH  . ESOPHAGEAL BANDING N/A 04/08/2019   Procedure: ESOPHAGEAL BANDING;  Surgeon: Danie Binder, MD;  [facility];  Service: Endoscopy;  Laterality: N/A;  . ESOPHAGOGASTRODUODENOSCOPY N/A 11/25/2012   GRADE 1 VARICES  . ESOPHAGOGASTRODUODENOSCOPY N/A 04/20/2016   Procedure: ESOPHAGOGASTRODUODENOSCOPY (EGD);  Surgeon: Danie Binder, MD;  [facility];  Service: Endoscopy;  Laterality: N/A;  8:30am  . ESOPHAGOGASTRODUODENOSCOPY (EGD) WITH PROPOFOL N/A 04/08/2019   Procedure: ESOPHAGOGASTRODUODENOSCOPY (EGD) WITH PROPOFOL;  Surgeon: Danie Binder, MD;  [facility];  Service: Endoscopy;  Laterality: N/A;  1:00pm  . HAND SURGERY Left    removal of foreign body and tendon repair.  Marland Kitchen HEMORRHOID BANDING  2014 x1  . UMBILICAL HERNIA REPAIR N/A 08/30/2015   Procedure: UMBILICAL HERNIORRHAPHY WITH MESH;  Surgeon: Aviva Signs, MD;  Location: [facility];  Service: General;  Laterality: N/A;    Current Outpatient Medications  Medication Sig Dispense Refill  . omeprazole (PRILOSEC) 20 MG capsule 1 PO 30 MINS PRIOR TO BREAKFAST. 90 capsule 3   No current facility-administered medications for this visit.    Allergies as of 03/30/2020  . (No Known Allergies)    Family History  Problem Relation Age of Onset  . Migraines Mother   . Colon cancer Neg Hx     Social History   Socioeconomic History  . Marital status: Single    Spouse name: Not on file  . Number of children: Not on file  . Years of  education: Not on file  . Highest education level: Not on file  Occupational History  . Not on file  Tobacco Use  . Smoking status: Former Smoker    Packs/day: 0.25    Years: 15.00    Pack years: 3.75    Types: Cigarettes    Quit date: 04/03/2012    Years since quitting: 7.9  . Smokeless tobacco: Never Used  . Tobacco comment:  Quit smoking more than a year ago  Vaping Use  . Vaping Use: Never used  Substance and Sexual Activity  . Alcohol use: No    Alcohol/week: 0.0 standard drinks    Comment: As of 09/24/19: heavy etoh until 02/2012- none since 2014.  . Drug use: No  . Sexual activity: Yes  Other Topics Concern  . 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   Social Determinants of Health   Financial Resource Strain: Not on file  Food Insecurity: Not on file  Transportation Needs: Not on file  Physical Activity: Not on file  Stress: Not on file  Social Connections: Not on file    Subjective: Review of Systems  Constitutional: Negative for chills, fever, malaise/fatigue and weight loss.  HENT: Negative for congestion and sore throat.   Respiratory: Negative for cough and shortness of breath.   Cardiovascular: Negative for chest pain and palpitations.  Gastrointestinal: Negative for abdominal pain, blood in stool, diarrhea, heartburn, melena, nausea and vomiting.  Musculoskeletal: Negative for joint pain and myalgias.  Skin: Negative for rash.  Neurological: Negative for dizziness and weakness.  Endo/Heme/Allergies: Does not bruise/bleed easily.  Psychiatric/Behavioral: Negative for depression. The patient is not nervous/anxious.   All other systems reviewed and are negative.    Objective: BP 129/86   Pulse 84   Temp 97.6 F (36.4 C)   Ht 5\' 8"  (1.727 m)   Wt 171 lb 12.8 oz (77.9 kg)   BMI 26.12 kg/m  Physical Exam Vitals and nursing note reviewed.  Constitutional:      General: He is not in acute distress.    Appearance: Normal appearance. He is normal weight. He is not ill-appearing, toxic-appearing or diaphoretic.  HENT:     Head: Normocephalic and atraumatic.     Nose: No congestion or rhinorrhea.  Eyes:     General: No scleral icterus. Cardiovascular:     Rate and Rhythm: Normal rate and regular rhythm.     Heart sounds: Normal  heart sounds.  Pulmonary:     Effort: Pulmonary effort is normal.     Breath sounds: Normal breath sounds.  Abdominal:     General: Bowel sounds are normal. There is no distension.     Palpations: Abdomen is soft. There is no hepatomegaly, splenomegaly or mass.     Tenderness: There is no abdominal tenderness. There is no guarding or rebound.     Hernia: No hernia is present.  Musculoskeletal:     Cervical back: Neck supple.  Skin:    General: Skin is warm and dry.     Coloration: Skin is not jaundiced.     Findings: No bruising or rash.  Neurological:     General: No focal deficit present.     Mental Status: He is alert and oriented to person, place, and time. Mental status is at baseline.  Psychiatric:        Mood and Affect: Mood normal.  Behavior: Behavior normal.        Thought Content: Thought content normal.      Assessment:  Very pleasant 45 year old male presents for follow-up on alcoholic cirrhosis.  He was accompanied by a Optometrist.  Denies any overt hepatic or GI symptoms at this time.  He is currently due for updated labs and imaging.  EGD is up-to-date next due in 2024, he is on recall.  Clinically he is stable.  No alcohol in numerous years.   Plan: 1. CBC, CMP, INR, AFP 2. Right upper quadrant ultrasound 3. Continue current medications 4. Continue alcohol abstinence 5. Follow-up in 6 months.    Thank you for allowing [place] to participate in the care of [place], DNP, AGNP-C Adult & Gerontological Nurse Practitioner [facility]   03/30/2020 10:48 AM   Disclaimer: This note was dictated with voice recognition software. Similar sounding words can inadvertently be transcribed and may not be corrected upon review.

## 2020-03-30 NOTE — Patient Instructions (Signed)
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 Congratulations on continuing to abstain from alcohol! 2. Have your labs drawn when you are able to 3. We will help schedule your ultrasound for you 4. Call us if you have any worsening or concerning symptoms  Overall I recommend:  1. Continue other current medications 2. Return for follow-up in 6 months 3. Call us for any questions or concerns   ---------------------------------------------------------------  At [facility] we value your feedback. You may receive a survey about your visit today. Please share your experience as we strive to create trusting relationships with our patients to provide genuine, compassionate, quality care.  We appreciate your understanding and patience as we review any laboratory studies, imaging, and other diagnostic tests that are ordered as we care for you. Our office policy is 5 business days for review of these results, and any emergent or urgent results are addressed in a timely manner for your best interest. If you do not hear from our office in 1 week, please contact us.   We also encourage the use of MyChart, which contains your medical information for your review as well. If you are not enrolled in this feature, an access code is on this after visit summary for your convenience. Thank you for allowing [place] to be involved in your care.  It was great to see you today!  I hope you have a safe and warm winter!!      Espanol:  Sus problemas de salud que discutimos hoy fueron:  Cirrosis heptica por consumo previo de alcohol: 1. Felicitaciones por seguir abstenindose del alcohol! 2. Hgase las pruebas de laboratorio cuando pueda 3. Le ayudaremos a programar su ultrasonido 4. Llmenos si tiene algn empeoramiento o sntomas preocupantes  En general recomiendo: 1. Continuar con otros medicamentos actuales 2. Regreso para  control a los 6 meses 3. Llmanos para cualquier duda o consulta   ---------------------------------------------------------------  En [facility] valoramos sus comentarios. Es posible que reciba una encuesta sobre su visita hoy. Comparta su experiencia mientras nos esforzamos por crear relaciones de confianza con nuestros pacientes para brindar atencin [place], compasiva y de calidad.  Agradecemos su comprensin y [employer] estudios de laboratorio, las imgenes y Scientist, research (medical) pruebas de diagnstico que se solicitan mientras lo cuidamos. Nuestra poltica de oficina es de 5 das hbiles para la revisin de [place], y cualquier resultado emergente o urgente se aborda de manera oportuna para su mejor inters. Si no recibe noticias de nuestra oficina en 1 semana, comunquese con nosotros.  Tambin recomendamos el uso de MyChart, que tambin contiene su informacin mdica para su revisin. Si no est inscrito en esta funcin, hay un cdigo de acceso en este resumen posterior a la visita para su conveniencia. Gracias por permitirnos estar involucrados en su cuidado.  Fue genial verte hoy! Espero que tengas un invierno seguro y clido!!

## 2020-03-31 LAB — AFP TUMOR MARKER: AFP, Serum, Tumor Marker: 2.1 ng/mL (ref 0.0–8.3)

## 2020-04-08 ENCOUNTER — Other Ambulatory Visit: Payer: Self-pay

## 2020-04-08 ENCOUNTER — Ambulatory Visit (HOSPITAL_COMMUNITY)
Admission: RE | Admit: 2020-04-08 | Discharge: 2020-04-08 | Disposition: A | Discharge status: Home / Self Care | Payer: Self-pay | Source: Ambulatory Visit | Attending: Nurse Practitioner | Admitting: Nurse Practitioner

## 2020-04-08 DIAGNOSIS — K7030 Alcoholic cirrhosis of liver without ascites: Secondary | ICD-10-CM | POA: Insufficient documentation

## 2020-04-08 DIAGNOSIS — F1011 Alcohol abuse, in remission: Secondary | ICD-10-CM | POA: Insufficient documentation

## 2020-04-08 DIAGNOSIS — I8510 Secondary esophageal varices without bleeding: Secondary | ICD-10-CM | POA: Insufficient documentation

## 2020-04-12 ENCOUNTER — Encounter: Payer: Self-pay | Admitting: Internal Medicine

## 2020-04-12 ENCOUNTER — Ambulatory Visit: Payer: Self-pay | Attending: Internal Medicine | Admitting: Internal Medicine

## 2020-04-12 ENCOUNTER — Other Ambulatory Visit: Payer: Self-pay

## 2020-04-12 VITALS — BP 126/83 | HR 71 | Resp 16 | Wt 174.0 lb

## 2020-04-12 DIAGNOSIS — H538 Other visual disturbances: Secondary | ICD-10-CM

## 2020-04-12 DIAGNOSIS — G5690 Unspecified mononeuropathy of unspecified upper limb: Secondary | ICD-10-CM

## 2020-04-12 DIAGNOSIS — Z23 Encounter for immunization: Secondary | ICD-10-CM

## 2020-04-12 DIAGNOSIS — K029 Dental caries, unspecified: Secondary | ICD-10-CM

## 2020-04-12 NOTE — Progress Notes (Signed)
Patient ID: Danny Arnold, male    DOB: [birth year]  MRN: [id number]  CC: Follow-up (2 week eval of hand pain )   Subjective: Danny Arnold is a 45 y.o. male who presents for f/u His concerns today include:  Hx of ETOH cirrhosis, esophageal varices.  Presents for follow-up on complaint of decreased strength in both hands since June of last year. No problems gripping objects.  No numbness in hands but feels his hands are "closing up."  02/27/2020:  Pain in elbows when he tries to lift anything heavy.  No swelling in the elbows. Dx with epicondylitis last year and I prescribed Voltaren gel.  He states that he did get the gel but did not find it too helpful.  The only thing that seems to help his hands and elbows is IcyHot rub.  Today:  Feels his hands no better since we spoke 6 wks ago.  Feels strength is decreased. Problems holding objects.  Endorses numbness/tingling but only in the thumb of the RT hand. No neck pain  C/o blurred vision both eyes x 4 mths.  Sometimes the eyes hurt.  No redness of eye.  C/o loose teeth in his mouth.  Requesting referral to dentist. Patient Active Problem List   Diagnosis Date Noted  . Influenza vaccine needed 04/12/2020  . 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 Patellofemoral pain syndrome of both knees 08/05/2019  . Lateral epicondylitis of both elbows 08/05/2019  . Constipation 12/21/2017  . Elevated blood pressure reading 12/04/2017  . History of alcohol abuse 06/19/2017  . Gastritis and gastroduodenitis   . Umbilical hernia 29/47/6546  . Esophageal varices in alcoholic cirrhosis ([place]) 50/35/4656  . Alcoholic hepatitis 81/27/5170  . Cirrhosis, alcoholic ([place]) 01/74/9449     Current Outpatient Medications on File Prior to Visit  Medication Sig Dispense Refill  . omeprazole (PRILOSEC) 20 MG capsule 1 PO 30 MINS PRIOR TO BREAKFAST. 90 capsule 3   No current facility-administered  medications on file prior to visit.    No Known Allergies  Social History   Socioeconomic History  . Marital status: Single    Spouse name: Not on file  . Number of children: Not on file  . Years of education: Not on file  . Highest education level: Not on file  Occupational History  . Not on file  Tobacco Use  . Smoking status: Former Smoker    Packs/day: 0.25    Years: 15.00    Pack years: 3.75    Types: Cigarettes    Quit date: 04/03/2012    Years since quitting: 8.0  . Smokeless tobacco: Never Used  . Tobacco comment: Quit smoking more than a year ago  Vaping Use  . Vaping Use: Never used  Substance and Sexual Activity  . Alcohol use: No    Alcohol/week: 0.0 standard drinks    Comment: As of 09/24/19: heavy etoh until 02/2012- none since 2014.  . Drug use: No  . Sexual activity: Yes  Other Topics Concern  . 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   Social Determinants of Health   Financial Resource Strain: Not on file  Food Insecurity: Not on file  Transportation Needs: Not on file  Physical Activity: Not on file  Stress: Not on file  Social Connections: Not on file  Intimate Partner Violence: Not on file    Family History  Problem Relation Age  of Onset  . Migraines Mother   . Colon cancer Neg Hx     Past Surgical History:  Procedure Laterality Date  . COLONOSCOPY N/A 11/25/2012   IH  . ESOPHAGEAL BANDING N/A 04/08/2019   Procedure: ESOPHAGEAL BANDING;  Surgeon: Danie Binder, MD;  [facility];  Service: Endoscopy;  Laterality: N/A;  . ESOPHAGOGASTRODUODENOSCOPY N/A 11/25/2012   GRADE 1 VARICES  . ESOPHAGOGASTRODUODENOSCOPY N/A 04/20/2016   Procedure: ESOPHAGOGASTRODUODENOSCOPY (EGD);  Surgeon: Danie Binder, MD;  [facility];  Service: Endoscopy;  Laterality: N/A;  8:30am  . ESOPHAGOGASTRODUODENOSCOPY (EGD) WITH PROPOFOL N/A 04/08/2019   Procedure: ESOPHAGOGASTRODUODENOSCOPY  (EGD) WITH PROPOFOL;  Surgeon: Danie Binder, MD;  [facility];  Service: Endoscopy;  Laterality: N/A;  1:00pm  . HAND SURGERY Left    removal of foreign body and tendon repair.  Marland Kitchen HEMORRHOID BANDING  2014 x1  . UMBILICAL HERNIA REPAIR N/A 08/30/2015   Procedure: UMBILICAL HERNIORRHAPHY WITH MESH;  Surgeon: Aviva Signs, MD;  Location: [facility];  Service: General;  Laterality: N/A;    ROS: Review of Systems Negative except as stated above  PHYSICAL EXAM: BP 126/83   Pulse 71   Resp 16   Wt 174 lb (78.9 kg)   SpO2 98%   BMI 26.46 kg/m   Physical Exam General appearance - alert, well appearing, and in no distress Mental status - normal mood, behavior, speech, dress, motor activity, and thought processes Eyes - pupils equal and reactive, extraocular eye movements intact Mouth - mucous membranes moist, pharynx normal without lesions.  He has some loss of gumline.  He has decayed first molar in the upon lower jaw. Neurological -grip 5/5 bilaterally.  Power with wrist flexion and extension 5/5 bilaterally.  No wasting of intrinsic muscles of the hands.  Tinel's sign negative.  Gross sensation intact of the hands.  He has good capillary reflex of the fingers.  He has a healed scar in the palm of the left hand going to the ring finger.  This is from an old injury.  CMP Latest Ref Rng & Units 03/30/2020 09/24/2019 04/04/2019  Glucose 70 - 99 mg/dL 96 101(H) 101(H)  BUN 6 - 20 mg/dL 12 14 14   Creatinine 0.61 - 1.24 mg/dL 0.87 0.95 1.12  Sodium 135 - 145 mmol/L 136 136 136  Potassium 3.5 - 5.1 mmol/L 3.8 3.9 3.7  Chloride 98 - 111 mmol/L 106 106 106  CO2 22 - 32 mmol/L 22 22 19(L)  Calcium 8.9 - 10.3 mg/dL 9.2 8.8(L) 9.0  Total Protein 6.5 - 8.1 g/dL 7.8 7.5 7.6  Total Bilirubin 0.3 - 1.2 mg/dL 1.4(H) 0.8 0.9  Alkaline Phos 38 - 126 U/L 59 64 61  AST 15 - 41 U/L 20 20 19   ALT 0 - 44 U/L 21 28 33   Lipid Panel  No results found for: CHOL, TRIG, HDL, CHOLHDL, VLDL, LDLCALC,  LDLDIRECT  CBC    Component Value Date/Time   WBC 7.8 03/30/2020 1139   RBC 5.57 03/30/2020 1139   HGB 17.1 (H) 03/30/2020 1139   HCT 50.2 03/30/2020 1139   PLT 266 03/30/2020 1139   MCV 90.1 03/30/2020 1139   MCH 30.7 03/30/2020 1139   MCHC 34.1 03/30/2020 1139   RDW 12.6 03/30/2020 1139   LYMPHSABS 1.9 03/30/2020 1139   MONOABS 0.6 03/30/2020 1139   EOSABS 0.1 03/30/2020 1139   BASOSABS 0.0 03/30/2020 1139    ASSESSMENT AND PLAN: 1. Neuropathy of  hand, unspecified laterality Neurologic exam of the hands essentially normal.  Unclear of etiology of subjective feeling of weakness.  Will check B12 level and refer him to neurology. - Vitamin B12 - Ambulatory referral to Neurology  2. Blurred vision, bilateral Recommend getting eye exam done.  Informed him that the orange card/cone discount card does not cover for eye exams.  3. Dental caries - Ambulatory referral to Dentistry  4. Influenza vaccine needed 5. Need for immunization against influenza - Flu Vaccine QUAD 36+ mos IM    Patient was given the opportunity to ask questions.  Patient verbalized understanding of the plan and was able to repeat key elements of the plan.  [employer] Language interpreter used during this encounter. Andree Moro #[id number]  Orders Placed This Encounter  Procedures  . Flu Vaccine QUAD 36+ mos IM     Requested Prescriptions    No prescriptions requested or ordered in this encounter    No follow-ups on file.  Karle Plumber, MD, FACP

## 2020-04-12 NOTE — Patient Instructions (Signed)
Influenza Virus Vaccine injection (Fluarix) Qu es este medicamento? La VACUNA ANTIGRIPAL ayuda a disminuir el riesgo de contraer la influenza, tambin conocida como la gripe. La vacuna solo ayuda a protegerle contra algunas cepas de influenza. Esta vacuna no ayuda a reducir Catering manager de contraer influenza pandmica H1N1. Este medicamento puede ser utilizado para otros usos; si tiene alguna pregunta consulte con su proveedor de atencin mdica o con su farmacutico. MARCAS COMUNES: Fluarix, Fluzone Qu le debo informar a mi profesional de la salud antes de tomar este medicamento? Necesita saber si usted presenta alguno de los siguientes problemas o situaciones:  trastorno de sangrado como hemofilia  fiebre o infeccin  sndrome de Guillain-Barre u otros problemas neurolgicos  problemas del sistema inmunolgico  infeccin por el virus de la inmunodeficiencia humana (VIH) o SIDA  niveles bajos de plaquetas en la sangre  esclerosis mltiple  una reaccin IT consultant o inusual a las vacunas antigripales, a los huevos, protenas de pollo, al ltex, a la gentamicina, a otros medicamentos, alimentos, colorantes o conservantes  si est embarazada o buscando quedar embarazada  si est amamantando a un beb Cmo debo [employer]? Esta vacuna se administra mediante inyeccin por va intramuscular. Lo administra un profesional de [employer]	[place]. Recibir una copia de informacin escrita sobre la vacuna antes de cada vacuna. Asegrese de leer este folleto cada vez cuidadosamente. Este folleto puede cambiar con frecuencia. Hable con su pediatra para informarse acerca del uso de este medicamento en nios. Puede requerir atencin especial. Sobredosis: Pngase en contacto inmediatamente con un centro toxicolgico o una sala de urgencia si usted cree que haya tomado demasiado medicamento. ATENCIN: [employer] es solo para usted. No comparta este medicamento con nadie. Qu sucede si me  olvido de una dosis? No se aplica en este caso. Qu puede interactuar con este medicamento?  quimioterapia o radioterapia  medicamentos que suprimen el sistema inmunolgico, tales como etanercept, anakinra, infliximab y adalimumab  medicamentos que tratan o previenen cogulos sanguneos, como warfarina  fenitona  medicamentos esteroideos, como la prednisona o la cortisona  teofilina  vacunas Puede ser que esta lista no menciona todas las posibles interacciones. Informe a su profesional de [employer]	[place] de [employer] productos a base de hierbas, medicamentos de [place] o suplementos nutritivos que est tomando. Si usted fuma, consume bebidas alcohlicas o si utiliza drogas ilegales, indqueselo tambin a su profesional de [employer]	[place]. Algunas sustancias pueden interactuar con su medicamento. A qu debo estar atento al usar [employer]? Informe a su mdico o a Barrister's clerk de la [employer] todos los efectos secundarios que persistan despus de 3 das. Llame a su proveedor de atencin mdica si se presentan sntomas inusuales dentro de las 6 semanas posteriores a la vacunacin. Es posible que todava pueda contraer la gripe, pero la enfermedad no ser tan fuerte como normalmente. No puede contraer la gripe de esta vacuna. La vacuna antigripal no le protege contra resfros u otras enfermedades que pueden causar [place]. Debe vacunarse cada ao. Qu efectos secundarios puedo tener al [employer] este medicamento? Efectos secundarios que debe informar a su mdico o a Barrister's clerk de la salud tan pronto como sea posible:  Chief of Staff como erupcin cutnea, picazn o urticarias, hinchazn de la cara, labios o lengua Efectos secundarios que, por lo general, no requieren atencin mdica (debe informarlos a su mdico o a su profesional de la salud si persisten o si son molestos):  fiebre  dolor de cabeza  molestias y dolores musculares  dolor, sensibilidad, enrojecimiento o  hinchazn en el lugar de la inyeccin  cansancio o debilidad Puede ser que esta lista no menciona todos los posibles efectos secundarios. Comunquese a su mdico por asesoramiento mdico [employer]. Usted puede informar los efectos secundarios a la FDA por telfono al 1-800-FDA-1088. Dnde debo guardar mi medicina? Esta vacuna se administra solamente en clnicas, farmacias, consultorio mdico u otro consultorio de un profesional de la salud y no Sports coach en su domicilio. ATENCIN: Este folleto es un resumen. Puede ser que no cubra toda la posible informacin. Si usted tiene preguntas acerca de esta medicina, consulte con su mdico, su farmacutico o su profesional de Technical sales engineer.  2021 Elsevier/Gold Standard (2009-08-03 15:31:40)

## 2020-04-12 NOTE — Progress Notes (Signed)
Pt states he is still losing strength  Pt states he sis having hand cramps

## 2020-04-13 ENCOUNTER — Encounter: Payer: Self-pay | Admitting: Neurology

## 2020-04-13 LAB — VITAMIN B12: Vitamin B-12: 383 pg/mL (ref 232–1245)

## 2020-04-14 ENCOUNTER — Other Ambulatory Visit: Payer: Self-pay | Admitting: Internal Medicine

## 2020-04-14 MED ORDER — VITAMIN B12 500 MCG PO TABS: 500.0000 ug | ORAL_TABLET | Freq: Every day | ORAL | 1 refills | Status: DC

## 2020-04-14 NOTE — Progress Notes (Signed)
Let patient know that his vitamin B12 level is in the low normal range.  I have sent a prescription to our pharmacy for B12 supplement for him to pick up and take daily.

## 2020-04-20 NOTE — Progress Notes (Signed)
Dear Mr. Stark Jock   All of your blood work looks good and is stable. If you have any questions or concerns please call the office @ [phone].     Thank you, Floria Raveling, CMA

## 2020-07-05 ENCOUNTER — Ambulatory Visit (INDEPENDENT_AMBULATORY_CARE_PROVIDER_SITE_OTHER): Payer: Self-pay | Admitting: Neurology

## 2020-07-05 ENCOUNTER — Encounter: Payer: Self-pay | Admitting: Neurology

## 2020-07-05 ENCOUNTER — Other Ambulatory Visit: Payer: Self-pay

## 2020-07-05 VITALS — BP 141/84 | HR 78 | Ht 68.0 in | Wt 171.0 lb

## 2020-07-05 DIAGNOSIS — M79602 Pain in left arm: Secondary | ICD-10-CM

## 2020-07-05 DIAGNOSIS — M79601 Pain in right arm: Secondary | ICD-10-CM

## 2020-07-05 NOTE — Progress Notes (Signed)
[facility] Clinic Note - Initial Visit   Date: 07/05/20  Danny Arnold MRN: [id number] DOB: [birth year]   Dear Dr. Wynetta Emery:  Thank you for your kind referral of Danny Arnold for consultation of bilateral arm weakness. Although his history is well known to you, please allow [place] to reiterate it for the purpose of our medical record. The patient was accompanied to the clinic by Spanish interpretor who also provides collateral information.     History of Present Illness: Danny Arnold is a 45 y.o. hispanic male with alcoholic cirrhosis (sober since 2014), esophageal varices and former smoker presenting for evaluation of bilateral arm weakness.  For the past 6 month, he has noticed weakness in the arms, especially with flexing at the elbow.  He also complains of lateral elbow pain and was being treated for lateral epicondylitis with voltaren gel, but did not appreciate improvement.  He has the most benefit with IcyHot.  He also reports having an area of numbness over the right thumb.  No numbness/tingling in the feet.  He also complains of "cracking" and cramping pain in the knees.  He works as a Curator.  Out-side paper records, electronic medical record, and images have been reviewed where available and summarized as:  Lab Results  Component Value Date   HGBA1C 4.9 11/25/2012   Lab Results  Component Value Date   [id number] 034 04/12/2020   Lab Results  Component Value Date   TSH 0.500 11/25/2012   No results found for: ESRSEDRATE, POCTSEDRATE  Past Medical History:  Diagnosis Date  . Alcoholic hepatitis   . Alcoholic liver disease ([place])   . Anemia   . Ascites 02/03/2013  . Bacteremia     Past Surgical History:  Procedure Laterality Date  . COLONOSCOPY N/A 11/25/2012   IH  . ESOPHAGEAL BANDING N/A 04/08/2019   Procedure: ESOPHAGEAL BANDING;  Surgeon: Danie Binder, MD;  [facility];  Service:  Endoscopy;  Laterality: N/A;  . ESOPHAGOGASTRODUODENOSCOPY N/A 11/25/2012   GRADE 1 VARICES  . ESOPHAGOGASTRODUODENOSCOPY N/A 04/20/2016   Procedure: ESOPHAGOGASTRODUODENOSCOPY (EGD);  Surgeon: Danie Binder, MD;  [facility];  Service: Endoscopy;  Laterality: N/A;  8:30am  . ESOPHAGOGASTRODUODENOSCOPY (EGD) WITH PROPOFOL N/A 04/08/2019   Procedure: ESOPHAGOGASTRODUODENOSCOPY (EGD) WITH PROPOFOL;  Surgeon: Danie Binder, MD;  [facility];  Service: Endoscopy;  Laterality: N/A;  1:00pm  . HAND SURGERY Left    removal of foreign body and tendon repair.  Marland Kitchen HEMORRHOID BANDING  2014 x1  . UMBILICAL HERNIA REPAIR N/A 08/30/2015   Procedure: UMBILICAL HERNIORRHAPHY WITH MESH;  Surgeon: Aviva Signs, MD;  Location: [facility];  Service: General;  Laterality: N/A;     Medications:  Outpatient Encounter Medications as of 07/05/2020  Medication Sig  . omeprazole (PRILOSEC) 20 MG capsule 1 PO 30 MINS PRIOR TO BREAKFAST.  Marland Kitchen vitamin B-12 (CYANOCOBALAMIN) 500 MCG tablet TAKE 500 MCG BY MOUTH DAILY.  . [DISCONTINUED] Cyanocobalamin (VITAMIN B-12) 500 MCG TABS Take 500 mcg by mouth daily.   No facility-administered encounter medications on file as of 07/05/2020.    Allergies: No Known Allergies  Family History: Family History  Problem Relation Age of Onset  . Migraines Mother   . Colon cancer Neg Hx     Social History: Social History   Tobacco Use  . Smoking status: Former Smoker    Packs/day: 0.25    Years: 15.00    Pack years: 3.75    Types: Cigarettes  Quit date: 04/03/2012    Years since quitting: 8.2  . Smokeless tobacco: Never Used  . Tobacco comment: Quit smoking more than a year ago  Vaping Use  . Vaping Use: Never used  Substance Use Topics  . Alcohol use: No    Alcohol/week: 0.0 standard drinks    Comment: As of 09/24/19: heavy etoh until 02/2012- none since 2014.  . 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    Vital Signs:  BP (!) 141/84   Pulse 78   Ht 5\' 8"  (1.727 m)   Wt 171 lb (77.6 kg)   BMI 26.00 kg/m   Neurological Exam: MENTAL STATUS including orientation to time, place, person, recent and remote memory, attention span and concentration, language, and fund of knowledge is normal.  Speech is not dysarthric.  CRANIAL NERVES: II:  No visual field defects.  Unremarkable fundi.   III-IV-VI: Pupils equal round and reactive to light.  Normal conjugate, extra-ocular eye movements in all directions of gaze.  No nystagmus.  No ptosis.   V:  Normal facial sensation.    VII:  Normal facial symmetry and movements.   VIII:  Normal hearing and vestibular function.   IX-X:  Normal palatal movement.   XI:  Normal shoulder shrug and head rotation.   XII:  Normal tongue strength and range of motion, no deviation or fasciculation.  MOTOR:  No atrophy, fasciculations or abnormal movements.  No pronator drift.  He has elbow pain with supination on the right.  Upper Extremity:  Right  Left  Deltoid  5/5   5/5   Biceps  5/5   5/5   Triceps  5/5   5/5   Infraspinatus 5/5  5/5  Medial pectoralis 5/5  5/5  Wrist extensors  5/5   5/5   Wrist flexors  5/5   5/5   Finger extensors  5/5   5/5   Finger flexors  5/5   5/5   Dorsal interossei  5/5   5/5   Abductor pollicis  5/5   5/5   Tone (Ashworth scale)  0  0   Lower Extremity:  Right  Left  Hip flexors  5/5   5/5   Hip extensors  5/5   5/5   Adductor 5/5  5/5  Abductor 5/5  5/5  Knee flexors  5/5   5/5   Knee extensors  5/5   5/5   Dorsiflexors  5/5   5/5   Plantarflexors  5/5   5/5   Toe extensors  5/5   5/5   Toe flexors  5/5   5/5   Tone (Ashworth scale)  0  0   MSRs:  Right        Left                  brachioradialis 2+  2+  biceps 2+  2+  triceps 2+  2+  patellar 2+  2+  ankle jerk 2+  2+  Hoffman no  no  plantar response down  down   SENSORY:  Normal and symmetric perception of light touch,  pinprick, vibration, and proprioception.  Romberg's sign absent.   COORDINATION/GAIT: Normal finger-to- nose-finger and heel-to-shin.  Intact rapid alternating movements bilaterally.  Able to rise from a chair without using arms.  Gait narrow based and stable. Tandem and stressed gait intact.  IMPRESSION: Bilateral arm pain and weakness is more suggestive of tendinopathy from overuse injury, moreso than neuropathy/cervical radiculopathy. His motor strength for me was normal.   He does have some numbness on the right thumb, which may be entrapment neuropathy vs C6 radiculopathy.  I will order NCS/EMG of the arms to help localize his symptoms.    Further recommendations pending results.   Thank you for allowing me to participate in patient's care.  If I can answer any additional questions, I would be pleased to do so.    Sincerely,    Gabbrielle Mcnicholas K. Posey Pronto, DO

## 2020-07-05 NOTE — Patient Instructions (Addendum)
rueba nerviosa de [employer].  [place] (EMG/NCS) INSTRUCCIONES  Cmo preparar El neurlogo que realiza la EMG necesitar saber si usted tiene ciertas condiciones mdicas. Informe al neurlogo y al resto del personal del laboratorio de EMG si usted: Tener un marcapasos o cualquier otro dispositivo mdico elctrico. Toma medicamentos anticoagulantes Tiene hemofilia, un trastorno de Air cabin crew de la sangre que causa sangrado prolongado Baos Dchese o bese poco antes de su examen para eliminar los aceites de su piel. No aplique lociones o cremas antes del examen. Que esperar Es probable que le pidan que se ponga una bata de hospital para el procedimiento y que se acueste en una mesa de examen. Las siguientes explicaciones pueden ayudarlo a comprender lo que Glass blower/designer. Electrodos. El neurlogo o un tcnico coloca electrodos de superficie en varios lugares de la piel, segn el lugar donde experimente los sntomas. O el neurlogo puede insertar electrodos de aguja en diferentes sitios segn sus sntomas. Sensaciones. En ocasiones, los electrodos transmitirn una pequea corriente elctrica que usted puede sentir como una punzada o un espasmo. El electrodo de [place] puede causar molestias o dolor que generalmente desaparece poco despus de retirar la aguja. Si le preocupa la incomodidad o Conservation officer, historic buildings, es posible que desee hablar con el neurlogo acerca de tomar un breve descanso durante el examen. Instrucciones. Durante la EMG con aguja, el neurlogo evaluar si existe actividad elctrica espontnea cuando el msculo est en reposo (actividad que no est presente en el tejido muscular sano) y Sigurd Sos de actividad cuando se contrae ligeramente el msculo. l o ella le dar instrucciones sobre cmo descansar y contraer un msculo en los momentos apropiados. Dependiendo de qu msculos y nervios est examinando el neurlogo, es posible que le  pida que cambie de posicin durante el examen. Despus de su EMG Es posible que experimente algunos moretones menores temporales donde se insert el electrodo de aguja en el msculo. Este hematoma debera desaparecer en [employer]. Si persiste, comunquese con su mdico de atencin primaria.

## 2020-07-13 ENCOUNTER — Other Ambulatory Visit: Payer: Self-pay

## 2020-07-13 ENCOUNTER — Ambulatory Visit (INDEPENDENT_AMBULATORY_CARE_PROVIDER_SITE_OTHER): Payer: Self-pay | Admitting: Neurology

## 2020-07-13 DIAGNOSIS — M79601 Pain in right arm: Secondary | ICD-10-CM

## 2020-07-13 DIAGNOSIS — M79602 Pain in left arm: Secondary | ICD-10-CM

## 2020-07-13 NOTE — Procedures (Signed)
[facility]  [address]  [place], [place] [address] Tel: [phone] Fax:  [phone] Test Date:  07/13/2020  Patient: Danny Arnold DOB: 1976/01/16 Physician: Narda Amber, DO  Sex: Male Height: 5\' 8"  Ref Phys: Narda Amber, DO  ID#: [id number]   Technician:    Patient Complaints: This is a 45 year old man referred for evaluation of bilateral arm pain and weakness.  NCV & EMG Findings: Extensive electrodiagnostic testing of the right upper extremity and additional studies of the left shows: 1. Bilateral median, ulnar, and mixed palmar sensory responses are within normal limits. 2. Bilateral median and ulnar motor responses are within normal limits. 3. There is no evidence of active or chronic motor axonal loss changes affecting any of the tested muscles.  Motor unit configuration and recruitment pattern is within normal limits.  Impression: This is a normal study of the upper extremities.  In particular, there is no evidence of carpal tunnel syndrome or a cervical radiculopathy.   ___________________________ Narda Amber, DO    Nerve Conduction Studies Anti Sensory Summary Table   Stim Site NR Peak (ms) Norm Peak (ms) P-T Amp (V) Norm P-T Amp  Left Median Anti Sensory (2nd Digit)  33C  Wrist    3.2 <3.4 31.3 >20  Right Median Anti Sensory (2nd Digit)  33C  Wrist    3.3 <3.4 28.3 >20  Left Ulnar Anti Sensory (5th Digit)  33C  Wrist    3.1 <3.1 21.0 >12  Right Ulnar Anti Sensory (5th Digit)  33C  Wrist    2.8 <3.1 19.6 >12   Motor Summary Table   Stim Site NR Onset (ms) Norm Onset (ms) O-P Amp (mV) Norm O-P Amp Site1 Site2 Delta-0 (ms) Dist (cm) Vel (m/s) Norm Vel (m/s)  Left Median Motor (Abd Poll Brev)  33C  Wrist    3.2 <3.9 8.2 >6 Elbow Wrist 4.5 26.0 58 >50  Elbow    7.7  7.8         Right Median Motor (Abd Poll Brev)  33C  Wrist    3.1 <3.9 9.8 >6 Elbow Wrist 4.7 26.0 55 >50  Elbow    7.8  9.8         Left Ulnar  Motor (Abd Dig Minimi)  33C  Wrist    2.7 <3.1 10.0 >7 B Elbow Wrist 3.4 22.0 65 >50  B Elbow    6.1  9.5  A Elbow B Elbow 1.9 10.0 53 >50  A Elbow    8.0  9.2         Right Ulnar Motor (Abd Dig Minimi)  33C  Wrist    2.0 <3.1 10.7 >7 B Elbow Wrist 3.7 22.0 59 >50  B Elbow    5.7  10.4  A Elbow B Elbow 2.0 10.0 50 >50  A Elbow    7.7  10.4          Comparison Summary Table   Stim Site NR Peak (ms) Norm Peak (ms) P-T Amp (V) Site1 Site2 Delta-P (ms) Norm Delta (ms)  Left Median/Ulnar Palm Comparison (Wrist - 8cm)  33C  Median Palm    1.8 <2.2 52.4 Median Palm Ulnar Palm 0.1   Ulnar Palm    1.9 <2.2 12.7      Right Median/Ulnar Palm Comparison (Wrist - 8cm)  33C  Median Palm    1.9 <2.2 52.4 Median Palm Ulnar Palm 0.1   Ulnar Palm    1.8 <2.2 9.7  EMG   Side Muscle Ins Act Fibs Psw Fasc Number Recrt Dur Dur. Amp Amp. Poly Poly. Comment  Right 1stDorInt Nml Nml Nml Nml Nml Nml Nml Nml Nml Nml Nml Nml N/A  Right PronatorTeres Nml Nml Nml Nml Nml Nml Nml Nml Nml Nml Nml Nml N/A  Right Biceps Nml Nml Nml Nml Nml Nml Nml Nml Nml Nml Nml Nml N/A  Right Triceps Nml Nml Nml Nml Nml Nml Nml Nml Nml Nml Nml Nml N/A  Right Deltoid Nml Nml Nml Nml Nml Nml Nml Nml Nml Nml Nml Nml N/A  Left 1stDorInt Nml Nml Nml Nml Nml Nml Nml Nml Nml Nml Nml Nml N/A  Left PronatorTeres Nml Nml Nml Nml Nml Nml Nml Nml Nml Nml Nml Nml N/A  Left Biceps Nml Nml Nml Nml Nml Nml Nml Nml Nml Nml Nml Nml N/A  Left Triceps Nml Nml Nml Nml Nml Nml Nml Nml Nml Nml Nml Nml N/A  Left Deltoid Nml Nml Nml Nml Nml Nml Nml Nml Nml Nml Nml Nml N/A      Waveforms:

## 2020-07-14 ENCOUNTER — Other Ambulatory Visit: Payer: Self-pay | Admitting: *Deleted

## 2020-07-14 DIAGNOSIS — M79601 Pain in right arm: Secondary | ICD-10-CM

## 2020-07-21 ENCOUNTER — Other Ambulatory Visit: Payer: Self-pay

## 2020-07-23 ENCOUNTER — Encounter: Payer: Self-pay | Admitting: Family Medicine

## 2020-08-10 ENCOUNTER — Encounter: Payer: Self-pay | Admitting: Family Medicine

## 2020-08-10 ENCOUNTER — Ambulatory Visit: Payer: Self-pay

## 2020-08-10 ENCOUNTER — Ambulatory Visit (INDEPENDENT_AMBULATORY_CARE_PROVIDER_SITE_OTHER): Payer: Self-pay | Admitting: Family Medicine

## 2020-08-10 ENCOUNTER — Other Ambulatory Visit: Payer: Self-pay

## 2020-08-10 VITALS — BP 120/80 | HR 89 | Ht 68.0 in | Wt 174.0 lb

## 2020-08-10 DIAGNOSIS — M79601 Pain in right arm: Secondary | ICD-10-CM

## 2020-08-10 DIAGNOSIS — K7030 Alcoholic cirrhosis of liver without ascites: Secondary | ICD-10-CM

## 2020-08-10 DIAGNOSIS — M79602 Pain in left arm: Secondary | ICD-10-CM

## 2020-08-10 NOTE — Patient Instructions (Addendum)
Thank you for coming in today.   Please perform the exercise program that we have prepared for you and gone over in detail on a daily basis.  In addition to the handout you were provided you can access your program through: [email or web address]   Your unique program code is:  [email or web address]  Recheck in 6 weeks Schedule with Interpreter.   Regresse con 6 semanas.   Please get labs today before you leave

## 2020-08-10 NOTE — Progress Notes (Signed)
Subjective:    I'm seeing this patient as a consultation for:  Dr. Posey Pronto and Dr. Wynetta Emery. Note will be routed back to referring provider/PCP.  CC: B arm pain  I, Judy Pimple, am serving as a Education administrator for Dr. Lynne Leader.   HPI: Pt is a 45 y/o male presenting w/ c/o B arm/elbow pain and decreased strength in his hands since June 2021.  He has been seen by his PCP and by neurology (Dr. Posey Pronto).  He locates his pain to Around the entire elbow area.  He was previously diagnosed w/ lateral epicondylitis. Patient states that when this first started his R arm cramped and was unable to straighten arm out and a week later the L arm did the same thing.   Radiating pain:no  Swelling: no Aggravating factors: attempts at lifting heavier objects;  Treatments tried: IcyHot; Voltaren gel  Diagnostic testing: NCV/EMG- 07/13/20;   Past medical history, Surgical history, Family history, Social history, Allergies, and medications have been entered into the medical record, reviewed.   Review of Systems: No new headache, visual changes, nausea, vomiting, diarrhea, constipation, dizziness, abdominal pain, skin rash, fevers, chills, night sweats, weight loss, swollen lymph nodes, body aches, joint swelling, muscle aches, chest pain, shortness of breath, mood changes, visual or auditory hallucinations.   Objective:    Vitals:   08/10/20 1533  BP: 120/80  Pulse: 89  SpO2: 98%   General: Well Developed, well nourished, and in no acute distress.  Neuro/Psych: Alert and oriented x3, extra-ocular muscles intact, able to move all 4 extremities, sensation grossly intact. Skin: Warm and dry, no rashes noted.  Respiratory: Not using accessory muscles, speaking in full sentences, trachea midline.  Cardiovascular: Pulses palpable, no extremity edema. Abdomen: Does not appear distended. MSK: Right elbow normal appearing Normal motion Tender palpation medial epicondyle. Nontender lateral epicondyle. Normal  strength. Some pain with resisted elbow flexion and supination.   Left elbow normal-appearing Normal motion. Tender palpation lateral epicondyle. Mildly tender palpation distal biceps tendon. Some pain with resisted wrist extension and elbow flexion. Strength intact  Pulses capillary refill and sensation are intact distally bilaterally.    Lab and Radiology Results    Chemistry      Component Value Date/Time   NA 136 03/30/2020 1139   K 3.8 03/30/2020 1139   CL 106 03/30/2020 1139   CO2 22 03/30/2020 1139   BUN 12 03/30/2020 1139   CREATININE 0.87 03/30/2020 1139   CREATININE 1.02 08/12/2015 1516      Component Value Date/Time   CALCIUM 9.2 03/30/2020 1139   ALKPHOS 59 03/30/2020 1139   AST 20 03/30/2020 1139   ALT 21 03/30/2020 1139   BILITOT 1.4 (H) 03/30/2020 1139       Nerve conduction study performed Jul 13, 2020: Patient Complaints: This is a 45 year old man referred for evaluation of bilateral arm pain and weakness.   NCV & EMG Findings: Extensive electrodiagnostic testing of the right upper extremity and additional studies of the left shows: Bilateral median, ulnar, and mixed palmar sensory responses are within normal limits. Bilateral median and ulnar motor responses are within normal limits. There is no evidence of active or chronic motor axonal loss changes affecting any of the tested muscles.  Motor unit configuration and recruitment pattern is within normal limits.   Impression: This is a normal study of the upper extremities.  In particular, there is no evidence of carpal tunnel syndrome or a cervical radiculopathy.     ___________________________ Arvin Collard  Posey Pronto, DO        Impression and Recommendations:    Assessment and Plan: 45 y.o. male with bilateral elbow pain ongoing for approximately 6 months thought to be related to mild tendinopathy.  Patient has both medial epicondylitis right and lateral epicondylitis left.  He also has some mild  distal biceps tendinitis both sides.  Plan to treat with home exercise program as taught in clinic today by ATC and referral to physical therapy.  Additionally recommend Voltaren gel.  He is at risk for rheumatologic disorders based on his cirrhosis.  Plan for limited rheumatologic work-up as well listed below.  Recheck in 6 weeks.  Of note today's visit conducted using a Spanish interpreter.Marland Kitchen  PDMP not reviewed this encounter. Orders Placed This Encounter  Procedures   Rheumatoid factor    Standing Status:   Future    Number of Occurrences:   1    Standing Expiration Date:   08/10/2021   Sedimentation rate    Standing Status:   Future    Number of Occurrences:   1    Standing Expiration Date:   06/06/5359   Cyclic citrul peptide antibody, IgG    Standing Status:   Future    Number of Occurrences:   1    Standing Expiration Date:   08/10/2021   ANA    Standing Status:   Future    Number of Occurrences:   1    Standing Expiration Date:   08/10/2021   CK    Standing Status:   Future    Number of Occurrences:   1    Standing Expiration Date:   08/10/2021   Ambulatory referral to Physical Therapy    Referral Priority:   Routine    Referral Type:   Physical Medicine    Referral Reason:   Specialty Services Required    Requested Specialty:   Physical Therapy   No orders of the defined types were placed in this encounter.   Discussed warning signs or symptoms. Please see discharge instructions. Patient expresses understanding.   The above documentation has been reviewed and is accurate and complete Lynne Leader, M.D.

## 2020-08-11 LAB — CK: Total CK: 54 U/L (ref 7–232)

## 2020-08-11 LAB — SEDIMENTATION RATE: Sed Rate: 13 mm/hr (ref 0–15)

## 2020-08-12 LAB — CYCLIC CITRUL PEPTIDE ANTIBODY, IGG: Cyclic Citrullin Peptide Ab: <16 UNITS

## 2020-08-12 LAB — ANA: Anti Nuclear Antibody (ANA): NEGATIVE

## 2020-08-12 LAB — RHEUMATOID FACTOR: Rheumatoid fact SerPl-aCnc: <14 IU/mL (ref <14)

## 2020-08-18 NOTE — Progress Notes (Signed)
Rheumatoid arthritis labs are negative.

## 2020-08-20 ENCOUNTER — Ambulatory Visit: Payer: Self-pay | Attending: Family Medicine

## 2020-08-20 ENCOUNTER — Other Ambulatory Visit: Payer: Self-pay

## 2020-08-20 DIAGNOSIS — M25621 Stiffness of right elbow, not elsewhere classified: Secondary | ICD-10-CM | POA: Insufficient documentation

## 2020-08-20 DIAGNOSIS — R600 Localized edema: Secondary | ICD-10-CM | POA: Insufficient documentation

## 2020-08-20 DIAGNOSIS — M25521 Pain in right elbow: Secondary | ICD-10-CM | POA: Insufficient documentation

## 2020-08-20 DIAGNOSIS — M25622 Stiffness of left elbow, not elsewhere classified: Secondary | ICD-10-CM | POA: Insufficient documentation

## 2020-08-20 DIAGNOSIS — M6281 Muscle weakness (generalized): Secondary | ICD-10-CM | POA: Insufficient documentation

## 2020-08-20 DIAGNOSIS — M25522 Pain in left elbow: Secondary | ICD-10-CM | POA: Insufficient documentation

## 2020-08-20 NOTE — Therapy (Signed)
[address] [place], [place], [address] Phone: [phone]   Fax:  [phone]  Physical Therapy Treatment  Patient Details  Name: Danny Arnold MRN: [id number] Date of Birth: Sep 06, 1975 Referring Provider (PT): Georgina Snell   Encounter Date: 08/20/2020   PT End of Session - 08/20/20 1007     Visit Number 1    Date for PT Re-Evaluation 10/01/20    Authorization Type Self pay - CAFA until 09/21/20    PT Start Time 0930    PT Stop Time 1007    PT Time Calculation (min) 37 min    Activity Tolerance Patient tolerated treatment well;Patient limited by pain    Behavior During Therapy [facility] for tasks assessed/performed             Past Medical History:  Diagnosis Date   Alcoholic hepatitis    Alcoholic liver disease ([place])    Anemia    Ascites 02/03/2013   Bacteremia     Past Surgical History:  Procedure Laterality Date   COLONOSCOPY N/A 11/25/2012   [facility]   ESOPHAGEAL BANDING N/A 04/08/2019   Procedure: ESOPHAGEAL BANDING;  Surgeon: Danie Binder, MD;  [facility];  Service: Endoscopy;  Laterality: N/A;   ESOPHAGOGASTRODUODENOSCOPY N/A 11/25/2012   [facility]   ESOPHAGOGASTRODUODENOSCOPY N/A 04/20/2016   Procedure: ESOPHAGOGASTRODUODENOSCOPY (EGD);  Surgeon: Danie Binder, MD;  Location: [facility];  Service: Endoscopy;  Laterality: N/A;  8:30am   ESOPHAGOGASTRODUODENOSCOPY (EGD) WITH PROPOFOL N/A 04/08/2019   Procedure: ESOPHAGOGASTRODUODENOSCOPY (EGD) WITH PROPOFOL;  Surgeon: Danie Binder, MD;  Location: [facility];  Service: Endoscopy;  Laterality: N/A;  1:00pm   HAND SURGERY Left    removal of foreign body and tendon repair.   HEMORRHOID BANDING  4742 x1   UMBILICAL HERNIA REPAIR N/A 08/30/2015   Procedure: UMBILICAL HERNIORRHAPHY WITH MESH;  Surgeon: Aviva Signs, MD;  Location: [facility] ORS;  Service: General;  Laterality: N/A;    There were no vitals filed for this visit.    Subjective Assessment - 08/20/20 0934     Subjective B elbow pain that started almost a year ago. Reports that around that time his arm got tight into flexion on the right and then the same with his left arm.  tried Rx cream that did not help as much, icy hot helps a little bit. Reports pain in the middle of the crease of elbow in front and at point of elocranon behind. Occasional lateral mms as well.    Pertinent History Alcoholic cirrhosis, had surgery of umbilical hernia previously, lateral epicondylitis last year   Diagnostic tests 07/13/20 EMG/Nerve conduction testing WNL B    Patient Stated Goals less pain with work    Currently in Pain? Yes    Pain Score 7     Pain Location Elbow    Pain Orientation Right;Left;Anterior;Posterior   pain medially, cramps laterally   Pain Radiating Towards reports 2nd digit on the right will get numb.    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                [facility] PT Assessment - 08/20/20 0001       Assessment   Medical Diagnosis M79.601,M79.602 (ICD-10-CM) - Bilateral arm pain  K70.30 ([id number]) - Alcoholic cirrhosis of liver without ascites [facility])    Referring Provider (PT) Georgina Snell  Hand Dominance Right    Next MD Visit Sep 21 2020    Prior Therapy none      Prior Function   Vocation Part time employment    Marketing executive.      Cognition   Overall Cognitive Status Within Functional Limits for tasks assessed      Observation/Other Assessments   Focus on Therapeutic Outcomes (FOTO)  to be assessed      ROM / Strength   AROM / PROM / Strength Strength;AROM      AROM   Overall AROM Comments right elbow 20 (pain at olecranon and lateral elbow) - 130 ( pain at medial/lateral). Left elbow 8-130 deg same pain. Grip right 85# with pain on release, left 75# with pain on release.      Strength   Overall Strength Comments RUE  4/5. LUE 4/5 with lat elbow pain with resisted flexion                                   PT Education - 08/20/20 1008     Education Details Initial PT POC and HEP: yellow TB wrist flexion and extension with arm supported, wrist ext stretch with straight elbow, resisted ulnar deviation. Safe use of ice to help decrease inflammation at the elbows and help with pain management    Person(s) Educated Patient    Methods Explanation;Demonstration;Handout   handout in [place] with images with pt in agreement, plan to provide updated spanish copies next visit   Comprehension Verbalized understanding;Returned demonstration              PT Short Term Goals - 08/20/20 1017       PT SHORT TERM GOAL #1   Title Independent with initial HEP    Time 1    Period Weeks    Status New    Target Date 08/27/20      PT SHORT TERM GOAL #2   Title FOTO to be assessed and appropriate goal created by visit #3    Time 1    Period Weeks    Status New    Target Date 08/27/20               PT Long Term Goals - 08/20/20 1018       PT LONG TERM GOAL #1   Title Symmetrical elbow ROM to [facility] with </= 2/10 pain.    Time 4    Period Weeks    Status New    Target Date 09/21/20      PT LONG TERM GOAL #2   Title Pt will demonstrate symmetrical BUE strength with </= 2/10 pain    Time 4    Period Weeks    Status New    Target Date 09/21/20      PT LONG TERM GOAL #3   Title Pt will report 50% improvement in symptoms with work duties, weighted UE movements    Time 4    Period Weeks    Status New    Target Date 09/21/20                   Plan - 08/20/20 1007     Clinical Impression Statement Pt is a 45 yo male who presents with year long hx of B elbow pain, denies any radicular pain above or below. Per referring MD note pt with mild tendinopathy( medial epicondylitis  right, lateral epicondylitis left, and  mild distal biceps tendinitis both sides). Pt has also  had nerve conduction testing/EMG which was found to be WNL. He current presents with decreased UE strength and  limited elbow ROM bilaterally (right dominant side with less extension compared to right). 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 He will benefit from skilled PT to address aforementioned impairments and work towards decreased pain and PLOF.    Personal Factors and Comorbidities Comorbidity 2    Examination-Activity Limitations Reach Overhead;Lift    Examination-Participation Restrictions Occupation    Stability/Clinical Decision Making Evolving/Moderate complexity    Clinical Decision Making Moderate    Rehab Potential Good    PT Frequency 2x / week    PT Duration 4 weeks   Self pay CAFA coverage until 09/21/20   PT Treatment/Interventions ADLs/Self Care Home Management;Cryotherapy;Electrical Stimulation;Iontophoresis 4mg /ml Dexamethasone;Moist Heat;Neuromuscular re-education;Therapeutic exercise;Therapeutic activities;Patient/family education;Manual techniques;Dry needling;Taping;Vasopneumatic Device    PT Next Visit Plan elbow FOTO?  reassess HEP and introduce general grip with arm supported with submax force (full grip painful and causes cramping). Elbow ROM and gradual UE strength. Manual and modalities as needed.    PT Home Exercise Plan see pt edu    Consulted and Agree with Plan of Care Patient             Patient will benefit from skilled therapeutic intervention in order to improve the following deficits and impairments:  Impaired UE functional use, Increased muscle spasms, Pain, Hypomobility, Impaired flexibility, Decreased strength, Decreased mobility, Increased edema  Visit Diagnosis: Pain in left elbow  Pain in right elbow  Stiffness of left elbow, not elsewhere classified  Stiffness of right elbow, not elsewhere classified  Muscle weakness (generalized)  Localized  edema     Problem List Patient Active Problem List   Diagnosis Date Noted   Influenza vaccine needed 04/12/2020   Secondary esophageal varices without bleeding ([place]) 08/05/2019   Patellofemoral pain syndrome of both knees 08/05/2019   Lateral epicondylitis of both elbows 08/05/2019   Constipation 12/21/2017   Elevated blood pressure reading 12/04/2017   History of alcohol abuse 06/19/2017   Gastritis and gastroduodenitis    Umbilical hernia 99/24/2683   Esophageal varices in alcoholic cirrhosis ([place]) 41/96/2229   Alcoholic hepatitis 79/89/2119   Cirrhosis, alcoholic ([place]) 41/74/0814    Hall Busing, PT, DPT 08/20/2020, 11:06 AM  [place] [place], [place], [address] Phone: [phone]   Fax:  [phone]  Name: Danny Arnold MRN: [id number] Date of Birth: [birth year]

## 2020-08-24 ENCOUNTER — Encounter: Payer: Self-pay | Admitting: Physical Therapy

## 2020-08-24 ENCOUNTER — Ambulatory Visit: Payer: Self-pay | Admitting: Physical Therapy

## 2020-08-24 ENCOUNTER — Other Ambulatory Visit: Payer: Self-pay

## 2020-08-24 DIAGNOSIS — M25622 Stiffness of left elbow, not elsewhere classified: Secondary | ICD-10-CM

## 2020-08-24 DIAGNOSIS — M25522 Pain in left elbow: Secondary | ICD-10-CM

## 2020-08-24 DIAGNOSIS — M25521 Pain in right elbow: Secondary | ICD-10-CM

## 2020-08-24 DIAGNOSIS — M6281 Muscle weakness (generalized): Secondary | ICD-10-CM

## 2020-08-24 DIAGNOSIS — R600 Localized edema: Secondary | ICD-10-CM

## 2020-08-24 DIAGNOSIS — M25621 Stiffness of right elbow, not elsewhere classified: Secondary | ICD-10-CM

## 2020-08-24 NOTE — Therapy (Signed)
[place] [place], [place], [address] Phone: [phone]   Fax:  [phone]  Physical Therapy Treatment  Patient Details  Name: Danny Arnold MRN: [id number] Date of Birth: 1975/11/18 Referring Provider (PT): Georgina Snell   Encounter Date: 08/24/2020   PT End of Session - 08/24/20 1452     Visit Number 2    Date for PT Re-Evaluation 10/01/20    Authorization Type Self pay - CAFA until 09/21/20    PT Start Time 1312    PT Stop Time 1356    PT Time Calculation (min) 44 min    Activity Tolerance Patient tolerated treatment well;Patient limited by pain    Behavior During Therapy [facility] for tasks assessed/performed             Past Medical History:  Diagnosis Date   Alcoholic hepatitis    Alcoholic liver disease ([place])    Anemia    Ascites 02/03/2013   Bacteremia     Past Surgical History:  Procedure Laterality Date   COLONOSCOPY N/A 11/25/2012   [facility]   ESOPHAGEAL BANDING N/A 04/08/2019   Procedure: ESOPHAGEAL BANDING;  Surgeon: Danie Binder, MD;  [facility];  Service: Endoscopy;  Laterality: N/A;   ESOPHAGOGASTRODUODENOSCOPY N/A 11/25/2012   [facility]   ESOPHAGOGASTRODUODENOSCOPY N/A 04/20/2016   Procedure: ESOPHAGOGASTRODUODENOSCOPY (EGD);  Surgeon: Danie Binder, MD;  [facility];  Service: Endoscopy;  Laterality: N/A;  8:30am   ESOPHAGOGASTRODUODENOSCOPY (EGD) WITH PROPOFOL N/A 04/08/2019   Procedure: ESOPHAGOGASTRODUODENOSCOPY (EGD) WITH PROPOFOL;  Surgeon: Danie Binder, MD;  [facility];  Service: Endoscopy;  Laterality: N/A;  1:00pm   HAND SURGERY Left    removal of foreign body and tendon repair.   HEMORRHOID BANDING  9833 x1   UMBILICAL HERNIA REPAIR N/A 08/30/2015   Procedure: UMBILICAL HERNIORRHAPHY WITH MESH;  Surgeon: Aviva Signs, MD;  Location: [facility];  Service: General;  Laterality: N/A;    There were no vitals filed for this visit.    Subjective Assessment - 08/24/20 1314     Subjective Pt reports he is doing well.    Pain Score 5     Pain Location Elbow                               [facility] Adult PT Treatment/Exercise - 08/24/20 0001       Exercises   Exercises Elbow      Elbow Exercises   Elbow Flexion 20 reps;Seated;Right;Left   3#   Elbow Extension 20 reps;Right;Left;Theraband   yellow   Other elbow exercises wrist flexion/extension stretch B 2x30 sec, Nustep L5x27min    Other elbow exercises wrist flexion, extension  2x10      Modalities   Modalities Iontophoresis      Iontophoresis   Type of Iontophoresis Dexamethasone    Location B Lateral epicondyles      Manual Therapy   Manual Therapy Soft tissue mobilization    Manual therapy comments wrist extensors                      PT Short Term Goals - 08/24/20 1452       PT SHORT TERM GOAL #1   Title Independent with initial HEP    Time 1    Period Weeks    Status Achieved    Target Date 08/27/20  PT SHORT TERM GOAL #2   Title FOTO to be assessed and appropriate goal created by visit #3    Time 1    Period Weeks    Status On-going               PT Long Term Goals - 08/20/20 1018       PT LONG TERM GOAL #1   Title Symmetrical elbow ROM to [facility] with </= 2/10 pain.    Time 4    Period Weeks    Status New    Target Date 09/21/20      PT LONG TERM GOAL #2   Title Pt will demonstrate symmetrical BUE strength with </= 2/10 pain    Time 4    Period Weeks    Status New    Target Date 09/21/20      PT LONG TERM GOAL #3   Title Pt will report 50% improvement in symptoms with work duties, weighted UE movements    Time 4    Period Weeks    Status New    Target Date 09/21/20                   Plan - 08/24/20 1449     Clinical Impression Statement Pt is having less pain today but is still hurting. He has been completing his HEP and tolerated all exercises today well. Soft tissue  mobilization was done to wrist extensors and pt had some decrease in pain. During bicep curls pt reported that he was feeling his grip strength begin to fatigue. He also recieved iontophoresis in order to help decrease pain.    PT Treatment/Interventions ADLs/Self Care Home Management;Cryotherapy;Electrical Stimulation;Iontophoresis 4mg /ml Dexamethasone;Moist Heat;Neuromuscular re-education;Therapeutic exercise;Therapeutic activities;Patient/family education;Manual techniques;Dry needling;Taping;Vasopneumatic Device    PT Next Visit Plan elbow FOTO?  reassess HEP and introduce general grip with arm supported with submax force (full grip painful and causes cramping). Elbow ROM and gradual UE strength. Manual and modalities as needed.    PT Home Exercise Plan see pt edu             Patient will benefit from skilled therapeutic intervention in order to improve the following deficits and impairments:  Impaired UE functional use, Increased muscle spasms, Pain, Hypomobility, Impaired flexibility, Decreased strength, Decreased mobility, Increased edema  Visit Diagnosis: Pain in left elbow  Pain in right elbow  Stiffness of left elbow, not elsewhere classified  Stiffness of right elbow, not elsewhere classified  Muscle weakness (generalized)  Localized edema     Problem List Patient Active Problem List   Diagnosis Date Noted   Influenza vaccine needed 04/12/2020   Secondary esophageal varices without bleeding ([place]) 08/05/2019   Patellofemoral pain syndrome of both knees 08/05/2019   Lateral epicondylitis of both elbows 08/05/2019   Constipation 12/21/2017   Elevated blood pressure reading 12/04/2017   History of alcohol abuse 06/19/2017   Gastritis and gastroduodenitis    Umbilical hernia 44/04/4740   Esophageal varices in alcoholic cirrhosis ([place]) 59/56/3875   Alcoholic hepatitis 64/33/2951   Cirrhosis, alcoholic ([place]) 88/41/6606    Dawayne Cirri, SPTA 08/24/2020, 2:54  PM  [place]. [place], [place], [address] Phone: [phone]   Fax:  [phone]  Name: Danny Arnold MRN: [id number] Date of Birth: [birth year]

## 2020-08-26 ENCOUNTER — Ambulatory Visit: Payer: Self-pay | Admitting: Physical Therapy

## 2020-08-26 ENCOUNTER — Other Ambulatory Visit: Payer: Self-pay

## 2020-08-26 ENCOUNTER — Encounter: Payer: Self-pay | Admitting: Physical Therapy

## 2020-08-26 DIAGNOSIS — M6281 Muscle weakness (generalized): Secondary | ICD-10-CM

## 2020-08-26 DIAGNOSIS — M25521 Pain in right elbow: Secondary | ICD-10-CM

## 2020-08-26 DIAGNOSIS — R600 Localized edema: Secondary | ICD-10-CM

## 2020-08-26 DIAGNOSIS — M25522 Pain in left elbow: Secondary | ICD-10-CM

## 2020-08-26 DIAGNOSIS — M25622 Stiffness of left elbow, not elsewhere classified: Secondary | ICD-10-CM

## 2020-08-26 DIAGNOSIS — M25621 Stiffness of right elbow, not elsewhere classified: Secondary | ICD-10-CM

## 2020-08-26 NOTE — Therapy (Signed)
[place]. [place], [place], [address] Phone: [phone]   Fax:  [phone]  Physical Therapy Treatment  Patient Details  Name: Danny Arnold MRN: [id number] Date of Birth: 1975/02/18 Referring Provider (PT): Georgina Snell   Encounter Date: 08/26/2020   PT End of Session - 08/26/20 1518     Visit Number 3    Date for PT Re-Evaluation 10/01/20    Authorization Type Self pay - CAFA until 09/21/20    PT Start Time 1430    PT Stop Time 1522    PT Time Calculation (min) 52 min    Activity Tolerance Patient tolerated treatment well;Patient limited by pain    Behavior During Therapy [facility] for tasks assessed/performed             Past Medical History:  Diagnosis Date   Alcoholic hepatitis    Alcoholic liver disease ([place])    Anemia    Ascites 02/03/2013   Bacteremia     Past Surgical History:  Procedure Laterality Date   COLONOSCOPY N/A 11/25/2012   [facility]   ESOPHAGEAL BANDING N/A 04/08/2019   Procedure: ESOPHAGEAL BANDING;  Surgeon: Danie Binder, MD;  [facility];  Service: Endoscopy;  Laterality: N/A;   ESOPHAGOGASTRODUODENOSCOPY N/A 11/25/2012   GRADE 1 VARICES   ESOPHAGOGASTRODUODENOSCOPY N/A 04/20/2016   Procedure: ESOPHAGOGASTRODUODENOSCOPY (EGD);  Surgeon: Danie Binder, MD;  [facility];  Service: Endoscopy;  Laterality: N/A;  8:30am   ESOPHAGOGASTRODUODENOSCOPY (EGD) WITH PROPOFOL N/A 04/08/2019   Procedure: ESOPHAGOGASTRODUODENOSCOPY (EGD) WITH PROPOFOL;  Surgeon: Danie Binder, MD;  [facility];  Service: Endoscopy;  Laterality: N/A;  1:00pm   HAND SURGERY Left    removal of foreign body and tendon repair.   HEMORRHOID BANDING  8756 x1   UMBILICAL HERNIA REPAIR N/A 08/30/2015   Procedure: UMBILICAL HERNIORRHAPHY WITH MESH;  Surgeon: Aviva Signs, MD;  Location: [facility];  Service: General;  Laterality: N/A;    There were no vitals filed for this visit.    Subjective Assessment - 08/26/20 1432     Subjective Pt is doing well. The iontophoresis worked really well.    Currently in Pain? Yes    Pain Score 3     Pain Location Elbow    Pain Orientation Right;Left                               [facility] Adult PT Treatment/Exercise - 08/26/20 0001       Elbow Exercises   Elbow Flexion 20 reps;Seated;Right;Left   5# pulley   Elbow Extension 20 reps;Right;Left;Theraband   10# pulley   Other elbow exercises wrist flexion/extension stretch B 2x30 sec, Nustep L5x75min    Other elbow exercises wrist flexion, extension,  red tband 2x12 ulnar and radial devation  2x10 red tband      Modalities   Modalities Iontophoresis      Iontophoresis   Type of Iontophoresis Dexamethasone    Location B Lateral epicondyles    Dose 1.2 cc Dex    Time 4 hour leave on patch      Manual Therapy   Manual Therapy Soft tissue mobilization;Passive ROM    Manual therapy comments wrist extensors    Passive ROM B elbow                      PT Short Term Goals - 08/24/20  [place] #1   Title Independent with initial HEP    Time 1    Period Weeks    Status Achieved    Target Date 08/27/20      PT SHORT TERM GOAL #2   Title FOTO to be assessed and appropriate goal created by visit #3    Time 1    Period Weeks    Status On-going               PT Long Term Goals - 08/20/20 1018       PT LONG TERM GOAL #1   Title Symmetrical elbow ROM to [facility] with </= 2/10 pain.    Time 4    Period Weeks    Status New    Target Date 09/21/20      PT LONG TERM GOAL #2   Title Pt will demonstrate symmetrical BUE strength with </= 2/10 pain    Time 4    Period Weeks    Status New    Target Date 09/21/20      PT LONG TERM GOAL #3   Title Pt will report 50% improvement in symptoms with work duties, weighted UE movements    Time 4    Period Weeks    Status New    Target Date 09/21/20                    Plan - 08/26/20 1514     Clinical Impression Statement Pt is having less pain today and reported the iontophoresis helped. Pt tolerated all exercises well with no increase in pain. Soft tissue mobilization was done to wrist extensors with less tightness noted. Noted some fatigue during ulnar and radial deviation exercises. PROM done to increase elbow ROM. He recieved iontophoresis again in order to decrease pain.    PT Treatment/Interventions ADLs/Self Care Home Management;Cryotherapy;Electrical Stimulation;Iontophoresis 4mg /ml Dexamethasone;Moist Heat;Neuromuscular re-education;Therapeutic exercise;Therapeutic activities;Patient/family education;Manual techniques;Dry needling;Taping;Vasopneumatic Device    PT Next Visit Plan work on grip strength, elbow FOTO? introduce general grip with arm supported with submax force (full grip painful and causes cramping). Elbow ROM and gradual UE strength. Manual and modalities as needed.    PT Home Exercise Plan see pt edu             Patient will benefit from skilled therapeutic intervention in order to improve the following deficits and impairments:  Impaired UE functional use, Increased muscle spasms, Pain, Hypomobility, Impaired flexibility, Decreased strength, Decreased mobility, Increased edema  Visit Diagnosis: Pain in left elbow  Pain in right elbow  Stiffness of left elbow, not elsewhere classified  Stiffness of right elbow, not elsewhere classified  Muscle weakness (generalized)  Localized edema     Problem List Patient Active Problem List   Diagnosis Date Noted   Influenza vaccine needed 04/12/2020   Secondary esophageal varices without bleeding ([place]) 08/05/2019   Patellofemoral pain syndrome of both knees 08/05/2019   Lateral epicondylitis of both elbows 08/05/2019   Constipation 12/21/2017   Elevated blood pressure reading 12/04/2017   History of alcohol abuse 06/19/2017   Gastritis and gastroduodenitis     Umbilical hernia 29/79/8921   Esophageal varices in alcoholic cirrhosis ([place]) 19/41/7408   Alcoholic hepatitis 14/48/1856   Cirrhosis, alcoholic ([place]) 31/49/7026    Dawayne Cirri, SPTA 08/26/2020, 3:21 PM  [place]. [place], [place], [address] Phone: [phone]   Fax:  [phone]  Name: Kamilo Och MRN: [id number] Date of Birth: 1975/11/10

## 2020-08-31 ENCOUNTER — Other Ambulatory Visit: Payer: Self-pay

## 2020-08-31 ENCOUNTER — Ambulatory Visit: Payer: Self-pay | Admitting: Physical Therapy

## 2020-08-31 ENCOUNTER — Encounter: Payer: Self-pay | Admitting: Physical Therapy

## 2020-08-31 DIAGNOSIS — M25621 Stiffness of right elbow, not elsewhere classified: Secondary | ICD-10-CM

## 2020-08-31 DIAGNOSIS — M25522 Pain in left elbow: Secondary | ICD-10-CM

## 2020-08-31 DIAGNOSIS — M25521 Pain in right elbow: Secondary | ICD-10-CM

## 2020-08-31 DIAGNOSIS — M25622 Stiffness of left elbow, not elsewhere classified: Secondary | ICD-10-CM

## 2020-08-31 DIAGNOSIS — M6281 Muscle weakness (generalized): Secondary | ICD-10-CM

## 2020-08-31 DIAGNOSIS — R600 Localized edema: Secondary | ICD-10-CM

## 2020-08-31 NOTE — Therapy (Signed)
[place]. [place], [place], [address] Phone: [phone]   Fax:  [phone]  Physical Therapy Treatment  Patient Details  Name: Danny Arnold MRN: [id number] Date of Birth: 04/20/1975 Referring Provider (PT): Georgina Snell   Encounter Date: 08/31/2020   PT End of Session - 08/31/20 0845     Visit Number 4    Date for PT Re-Evaluation 10/01/20    Authorization Type Self pay - CAFA until 09/21/20    PT Start Time 0757    PT Stop Time 0851    PT Time Calculation (min) 54 min    Activity Tolerance Patient tolerated treatment well;Patient limited by pain    Behavior During Therapy [facility] for tasks assessed/performed             Past Medical History:  Diagnosis Date   Alcoholic hepatitis    Alcoholic liver disease ([place])    Anemia    Ascites 02/03/2013   Bacteremia     Past Surgical History:  Procedure Laterality Date   COLONOSCOPY N/A 11/25/2012   [facility]   ESOPHAGEAL BANDING N/A 04/08/2019   Procedure: ESOPHAGEAL BANDING;  Surgeon: Danie Binder, MD;  [facility];  Service: Endoscopy;  Laterality: N/A;   ESOPHAGOGASTRODUODENOSCOPY N/A 11/25/2012   GRADE 1 VARICES   ESOPHAGOGASTRODUODENOSCOPY N/A 04/20/2016   Procedure: ESOPHAGOGASTRODUODENOSCOPY (EGD);  Surgeon: Danie Binder, MD;  [facility];  Service: Endoscopy;  Laterality: N/A;  8:30am   ESOPHAGOGASTRODUODENOSCOPY (EGD) WITH PROPOFOL N/A 04/08/2019   Procedure: ESOPHAGOGASTRODUODENOSCOPY (EGD) WITH PROPOFOL;  Surgeon: Danie Binder, MD;  [facility];  Service: Endoscopy;  Laterality: N/A;  1:00pm   HAND SURGERY Left    removal of foreign body and tendon repair.   HEMORRHOID BANDING  8841 x1   UMBILICAL HERNIA REPAIR N/A 08/30/2015   Procedure: UMBILICAL HERNIORRHAPHY WITH MESH;  Surgeon: Aviva Signs, MD;  Location: [facility];  Service: General;  Laterality: N/A;    There were no vitals filed for this visit.    Subjective Assessment - 08/31/20 0758     Subjective Pt is doing well. He is not in a lot of pain and the iontophoresis is helping.    Currently in Pain? Yes    Pain Score 3     Pain Location Elbow                               [facility] Adult PT Treatment/Exercise - 08/31/20 0001       Exercises   Exercises Wrist      Elbow Exercises   Elbow Flexion 20 reps;Seated;Right;Left   15# pulley   Elbow Extension 20 reps;Right;Left;Theraband   15# pulley   Other elbow exercises UBE L1 3 min each way    Other elbow exercises Lat pulldown 2x10 20#, grip strength x3 each way, wrist flexion/extension stretch x30 sec wrist flexion/ext gren tband x15      Modalities   Modalities Iontophoresis      Iontophoresis   Type of Iontophoresis Dexamethasone    Location B Lateral epicondyles    Dose 1.2 cc Dex    Time 4 hour leave on patch      Manual Therapy   Manual Therapy Soft tissue mobilization;Passive ROM    Manual therapy comments wrist extensors    Passive ROM B elbow  PT Short Term Goals - 08/24/20 1452       PT SHORT TERM GOAL #1   Title Independent with initial HEP    Time 1    Period Weeks    Status Achieved    Target Date 08/27/20      PT SHORT TERM GOAL #2   Title FOTO to be assessed and appropriate goal created by visit #3    Time 1    Period Weeks    Status On-going               PT Long Term Goals - 08/31/20 0845       PT LONG TERM GOAL #1   Title Symmetrical elbow ROM to [facility] with </= 2/10 pain.    Time 4    Period Weeks    Status On-going      PT LONG TERM GOAL #2   Title Pt will demonstrate symmetrical BUE strength with </= 2/10 pain    Time 4    Period Weeks    Status On-going      PT LONG TERM GOAL #3   Title Pt will report 50% improvement in symptoms with work duties, weighted UE movements    Time 4    Period Weeks    Status On-going                   Plan - 08/31/20 0842      Clinical Impression Statement Pt continues to have only a little bit of pain and the iontophoresis continues to help. Pt tolerated all exercises well. He reported the L UE feeling tighter than the R UE during stretches. He noticed that his elbows are getting straighter. Focused on getting elbows as straight as possible on tricep extension and lat pulldowns. Pt continues to have palpable tightness on B wrist exentsors. PROM done to continue to increase elbow ROM. He recieved iontophoresis again in order to decrease pain.    PT Treatment/Interventions ADLs/Self Care Home Management;Cryotherapy;Electrical Stimulation;Iontophoresis 4mg /ml Dexamethasone;Moist Heat;Neuromuscular re-education;Therapeutic exercise;Therapeutic activities;Patient/family education;Manual techniques;Dry needling;Taping;Vasopneumatic Device    PT Next Visit Plan work on grip strength, elbow FOTO? introduce general grip with arm supported with submax force (full grip painful and causes cramping). Elbow ROM and gradual UE strength. Manual and modalities as needed.             Patient will benefit from skilled therapeutic intervention in order to improve the following deficits and impairments:  Impaired UE functional use, Increased muscle spasms, Pain, Hypomobility, Impaired flexibility, Decreased strength, Decreased mobility, Increased edema  Visit Diagnosis: Pain in left elbow  Pain in right elbow  Stiffness of left elbow, not elsewhere classified  Stiffness of right elbow, not elsewhere classified  Muscle weakness (generalized)  Localized edema     Problem List Patient Active Problem List   Diagnosis Date Noted   Influenza vaccine needed 04/12/2020   Secondary esophageal varices without bleeding ([place]) 08/05/2019   Patellofemoral pain syndrome of both knees 08/05/2019   Lateral epicondylitis of both elbows 08/05/2019   Constipation 12/21/2017   Elevated blood pressure reading 12/04/2017   History of alcohol  abuse 06/19/2017   Gastritis and gastroduodenitis    Umbilical hernia 93/81/0175   Esophageal varices in alcoholic cirrhosis ([place]) 12/07/8525   Alcoholic hepatitis 78/24/2353   Cirrhosis, alcoholic ([place]) 61/44/3154    Dawayne Cirri, SPTA 08/31/2020, 8:48 AM  [place]. [place], [place], [address] Phone: [phone]  Fax:  [phone]  Name: Danny Arnold MRN: [id number] Date of Birth: [birth year]

## 2020-09-02 ENCOUNTER — Other Ambulatory Visit: Payer: Self-pay

## 2020-09-02 ENCOUNTER — Encounter: Payer: Self-pay | Admitting: Physical Therapy

## 2020-09-02 ENCOUNTER — Ambulatory Visit: Payer: Self-pay | Admitting: Physical Therapy

## 2020-09-02 DIAGNOSIS — M25521 Pain in right elbow: Secondary | ICD-10-CM

## 2020-09-02 DIAGNOSIS — M25522 Pain in left elbow: Secondary | ICD-10-CM

## 2020-09-02 DIAGNOSIS — M25621 Stiffness of right elbow, not elsewhere classified: Secondary | ICD-10-CM

## 2020-09-02 DIAGNOSIS — M25622 Stiffness of left elbow, not elsewhere classified: Secondary | ICD-10-CM

## 2020-09-02 DIAGNOSIS — R600 Localized edema: Secondary | ICD-10-CM

## 2020-09-02 DIAGNOSIS — M6281 Muscle weakness (generalized): Secondary | ICD-10-CM

## 2020-09-02 NOTE — Therapy (Signed)
[place]. [place], [place], [address] Phone: [phone]   Fax:  [phone]  Physical Therapy Treatment  Patient Details  Name: Danny Arnold MRN: [id number] Date of Birth: 1975/09/02 Referring Provider (PT): Marikay Alar Date: 09/02/2020   PT End of Session - 09/02/20 0832     Visit Number 5    Date for PT Re-Evaluation 10/01/20    Authorization Type Self pay - CAFA until 09/21/20    PT Start Time 6269    PT Stop Time 0835    PT Time Calculation (min) 38 min    Activity Tolerance Patient tolerated treatment well    Behavior During Therapy [facility] for tasks assessed/performed             Past Medical History:  Diagnosis Date   Alcoholic hepatitis    Alcoholic liver disease ([place])    Anemia    Ascites 02/03/2013   Bacteremia     Past Surgical History:  Procedure Laterality Date   COLONOSCOPY N/A 11/25/2012   [facility]   ESOPHAGEAL BANDING N/A 04/08/2019   Procedure: ESOPHAGEAL BANDING;  Surgeon: Danie Binder, MD;  Location: [facility];  Service: Endoscopy;  Laterality: N/A;   ESOPHAGOGASTRODUODENOSCOPY N/A 11/25/2012   GRADE 1 VARICES   ESOPHAGOGASTRODUODENOSCOPY N/A 04/20/2016   Procedure: ESOPHAGOGASTRODUODENOSCOPY (EGD);  Surgeon: Danie Binder, MD;  Location: [facility];  Service: Endoscopy;  Laterality: N/A;  8:30am   ESOPHAGOGASTRODUODENOSCOPY (EGD) WITH PROPOFOL N/A 04/08/2019   Procedure: ESOPHAGOGASTRODUODENOSCOPY (EGD) WITH PROPOFOL;  Surgeon: Danie Binder, MD;  Location: [facility];  Service: Endoscopy;  Laterality: N/A;  1:00pm   HAND SURGERY Left    removal of foreign body and tendon repair.   HEMORRHOID BANDING  4854 x1   UMBILICAL HERNIA REPAIR N/A 08/30/2015   Procedure: UMBILICAL HERNIORRHAPHY WITH MESH;  Surgeon: Aviva Signs, MD;  Location: [facility] ORS;  Service: General;  Laterality: N/A;    There were no vitals filed for this visit.   Subjective Assessment -  09/02/20 0800     Subjective Patches are still helping, having a little pain    Currently in Pain? Yes    Pain Score 3     Pain Location Elbow    Pain Orientation Left;Right;Lateral                               [facility] Adult PT Treatment/Exercise - 09/02/20 0001       Exercises   Exercises Elbow      Elbow Exercises   Elbow Flexion 15 reps;Standing;Bar weights/barbell;Both;Strengthening;Power [employer], 15lb   Elbow Extension Strengthening;Power Tower;Both;Bar weights/barbell   35lb x2   Other elbow exercises UBE L1.5  3 min each way    Other elbow exercises Lat pulldown 2x10 20      Wrist Exercises   Wrist Flexion Strengthening;Both;20 reps;Seated;Bar weights/barbell   3lb   Wrist Extension Strengthening;20 reps;Seated;Both;Bar weights/barbell   3lb   Wrist Radial Deviation Strengthening;Both;20 reps;Seated;Theraband    Theraband Level (Radial Deviation) Level 2 (Red)    Wrist Ulnar Deviation Both;20 reps;Theraband;Seated    Theraband Level (Ulnar Deviation) Level 2 (Red)    Other wrist exercises Velcro board pin & key X3 each    Other wrist exercises Stress ball squeezes x20 each      Modalities   Modalities Iontophoresis      Iontophoresis   Type  of Iontophoresis Dexamethasone    Location B Lateral epicondyles    Dose 1.2 cc Dex    Time 4 hour leave on patch      Manual Therapy   Manual Therapy Soft tissue mobilization;Passive ROM    Manual therapy comments wrist extensors    Passive ROM B elbow                      PT Short Term Goals - 08/24/20 1452       PT SHORT TERM GOAL #1   Title Independent with initial HEP    Time 1    Period Weeks    Status Achieved    Target Date 08/27/20      PT SHORT TERM GOAL #2   Title FOTO to be assessed and appropriate goal created by visit #3    Time 1    Period Weeks    Status On-going               PT Long Term Goals - 08/31/20 0845       PT LONG TERM GOAL #1   Title  Symmetrical elbow ROM to [facility] with </= 2/10 pain.    Time 4    Period Weeks    Status On-going      PT LONG TERM GOAL #2   Title Pt will demonstrate symmetrical BUE strength with </= 2/10 pain    Time 4    Period Weeks    Status On-going      PT LONG TERM GOAL #3   Title Pt will report 50% improvement in symptoms with work duties, weighted UE movements    Time 4    Period Weeks    Status On-going                   Plan - 09/02/20 1027     Clinical Impression Statement Pt continues to report decrease pain overall. Stress ball squeezes did cause some pain and cramping. Tactile cues to prevent compensation needed with wrist radial and ulnar deviation.Postural cues needed with seated rows. STM and ionto to help with pain    Personal Factors and Comorbidities Comorbidity 2    Examination-Activity Limitations Reach Overhead;Lift    Examination-Participation Restrictions Occupation    Stability/Clinical Decision Making Evolving/Moderate complexity    PT Frequency 2x / week    PT Duration 4 weeks    PT Treatment/Interventions ADLs/Self Care Home Management;Cryotherapy;Electrical Stimulation;Iontophoresis 4mg /ml Dexamethasone;Moist Heat;Neuromuscular re-education;Therapeutic exercise;Therapeutic activities;Patient/family education;Manual techniques;Dry needling;Taping;Vasopneumatic Device    PT Next Visit Plan work on grip strength,  (full grip painful and causes cramping). Elbow ROM and gradual UE strength. Manual and modalities as needed.             Patient will benefit from skilled therapeutic intervention in order to improve the following deficits and impairments:  Impaired UE functional use, Increased muscle spasms, Pain, Hypomobility, Impaired flexibility, Decreased strength, Decreased mobility, Increased edema  Visit Diagnosis: Pain in right elbow  Pain in left elbow  Stiffness of left elbow, not elsewhere classified  Stiffness of right elbow, not elsewhere  classified  Muscle weakness (generalized)  Localized edema     Problem List Patient Active Problem List   Diagnosis Date Noted   Influenza vaccine needed 04/12/2020   Secondary esophageal varices without bleeding ([place]) 08/05/2019   Patellofemoral pain syndrome of both knees 08/05/2019   Lateral epicondylitis of both elbows 08/05/2019   Constipation 12/21/2017   Elevated  blood pressure reading 12/04/2017   History of alcohol abuse 06/19/2017   Gastritis and gastroduodenitis    Umbilical hernia 14/11/3011   Esophageal varices in alcoholic cirrhosis ([place]) 14/38/8875   Alcoholic hepatitis 79/72/8206   Cirrhosis, alcoholic ([place]) 01/56/1537    Scot Jun 09/02/2020, 8:36 AM  [place]. [place], [place], [address] Phone: [phone]   Fax:  [phone]  Name: Danny Arnold MRN: [id number] Date of Birth: 15-Nov-1975

## 2020-09-07 ENCOUNTER — Ambulatory Visit: Payer: Self-pay | Admitting: Physical Therapy

## 2020-09-07 ENCOUNTER — Other Ambulatory Visit: Payer: Self-pay

## 2020-09-07 ENCOUNTER — Encounter: Payer: Self-pay | Admitting: Physical Therapy

## 2020-09-07 DIAGNOSIS — M25621 Stiffness of right elbow, not elsewhere classified: Secondary | ICD-10-CM

## 2020-09-07 DIAGNOSIS — M6281 Muscle weakness (generalized): Secondary | ICD-10-CM

## 2020-09-07 DIAGNOSIS — M25521 Pain in right elbow: Secondary | ICD-10-CM

## 2020-09-07 DIAGNOSIS — M25622 Stiffness of left elbow, not elsewhere classified: Secondary | ICD-10-CM

## 2020-09-07 DIAGNOSIS — M25522 Pain in left elbow: Secondary | ICD-10-CM

## 2020-09-07 DIAGNOSIS — R600 Localized edema: Secondary | ICD-10-CM

## 2020-09-07 NOTE — Therapy (Signed)
[place]. [place], [place], [address] Phone: [phone]   Fax:  [phone]  Physical Therapy Treatment  Patient Details  Name: Danny Arnold MRN: [id number] Date of Birth: 10/02/1975 Referring Provider (PT): Marikay Alar Date: 09/07/2020   PT End of Session - 09/07/20 0922     Visit Number 6    Date for PT Re-Evaluation 10/01/20    Authorization Type Self pay - CAFA until 09/21/20    PT Start Time 0840    PT Stop Time 0930    PT Time Calculation (min) 50 min    Activity Tolerance Patient tolerated treatment well    Behavior During Therapy [facility] for tasks assessed/performed             Past Medical History:  Diagnosis Date   Alcoholic hepatitis    Alcoholic liver disease ([place])    Anemia    Ascites 02/03/2013   Bacteremia     Past Surgical History:  Procedure Laterality Date   COLONOSCOPY N/A 11/25/2012   [facility]   ESOPHAGEAL BANDING N/A 04/08/2019   Procedure: ESOPHAGEAL BANDING;  Surgeon: Danie Binder, MD;  [facility];  Service: Endoscopy;  Laterality: N/A;   ESOPHAGOGASTRODUODENOSCOPY N/A 11/25/2012   GRADE 1 VARICES   ESOPHAGOGASTRODUODENOSCOPY N/A 04/20/2016   Procedure: ESOPHAGOGASTRODUODENOSCOPY (EGD);  Surgeon: Danie Binder, MD;  [facility];  Service: Endoscopy;  Laterality: N/A;  8:30am   ESOPHAGOGASTRODUODENOSCOPY (EGD) WITH PROPOFOL N/A 04/08/2019   Procedure: ESOPHAGOGASTRODUODENOSCOPY (EGD) WITH PROPOFOL;  Surgeon: Danie Binder, MD;  [facility];  Service: Endoscopy;  Laterality: N/A;  1:00pm   HAND SURGERY Left    removal of foreign body and tendon repair.   HEMORRHOID BANDING  123456 x1   UMBILICAL HERNIA REPAIR N/A 08/30/2015   Procedure: UMBILICAL HERNIORRHAPHY WITH MESH;  Surgeon: Aviva Signs, MD;  Location: [facility] ORS;  Service: General;  Laterality: N/A;    There were no vitals filed for this visit.   Subjective Assessment -  09/07/20 0839     Subjective Pt is haveing a little bit of pain. Patches are still helping.    Pain Score 2     Pain Location Elbow                               [facility] Adult PT Treatment/Exercise - 09/07/20 0001       Elbow Exercises   Elbow Flexion 15 reps;Standing;Bar weights/barbell;Both;Strengthening;[employer], Nurse, mental health;Bar weights/barbell;10 reps   35lb x2   Other elbow exercises UBE L2  3 min each way    Other elbow exercises Lat pulldown 2x10 25, rows 20# 2x10      Wrist Exercises   Wrist Flexion Strengthening;Both;20 reps;Seated;Bar weights/barbell   4lb   Wrist Extension Strengthening;20 reps;Seated;Both;Bar weights/barbell   4lb   Wrist Radial Deviation Strengthening;Both;20 reps;Seated;Theraband    Theraband Level (Radial Deviation) Level 3 (Green)    Wrist Ulnar Deviation Both;20 reps;Theraband;Seated    Theraband Level (Ulnar Deviation) Level 3 (Green)    Other wrist exercises Velcro board pin & key X4 each    Other wrist exercises Stress ball squeezes x30 each      Modalities   Modalities Iontophoresis      Iontophoresis   Type of Iontophoresis Dexamethasone    Location B Lateral epicondyles    Dose 1.2  cc Dex    Time 4 hour leave on patch      Manual Therapy   Manual Therapy Soft tissue mobilization;Passive ROM    Manual therapy comments wrist extensors    Passive ROM B elbow                      PT Short Term Goals - 08/24/20 1452       PT SHORT TERM GOAL #1   Title Independent with initial HEP    Time 1    Period Weeks    Status Achieved    Target Date 08/27/20      PT SHORT TERM GOAL #2   Title FOTO to be assessed and appropriate goal created by visit #3    Time 1    Period Weeks    Status On-going               PT Long Term Goals - 09/07/20 [id number]       PT LONG TERM GOAL #1   Title Symmetrical elbow ROM to [facility] with </= 2/10 pain.    Time 4     Period Weeks    Status On-going      PT LONG TERM GOAL #2   Title Pt will demonstrate symmetrical BUE strength with </= 2/10 pain    Time 4    Period Weeks    Status On-going      PT LONG TERM GOAL #3   Title Pt will report 50% improvement in symptoms with work duties, weighted UE movements    Time 4    Period Weeks    Status On-going                   Plan - 09/07/20 0919     Clinical Impression Statement Pt continues to have a decrease in pain. Continued to progress strengthening exercises. Verbal and tactile cues need for bicep curl and tricep pushdowns for posture. Tactile cues to prvent compensation during radial and ulnar deviation. Continued to focus on elbow straightening thorughout exercises. He did not report any pain during stress ball squeezes today. iontophoresis continues to help decrease pain.    PT Treatment/Interventions ADLs/Self Care Home Management;Cryotherapy;Electrical Stimulation;Iontophoresis '4mg'$ /ml Dexamethasone;Moist Heat;Neuromuscular re-education;Therapeutic exercise;Therapeutic activities;Patient/family education;Manual techniques;Dry needling;Taping;Vasopneumatic Device    PT Next Visit Plan work on grip strength,  (full grip painful and causes cramping). Elbow ROM and gradual UE strength. Manual and modalities as needed.    PT Home Exercise Plan see pt edu             Patient will benefit from skilled therapeutic intervention in order to improve the following deficits and impairments:  Impaired UE functional use, Increased muscle spasms, Pain, Hypomobility, Impaired flexibility, Decreased strength, Decreased mobility, Increased edema  Visit Diagnosis: Pain in right elbow  Pain in left elbow  Stiffness of left elbow, not elsewhere classified  Stiffness of right elbow, not elsewhere classified  Muscle weakness (generalized)  Localized edema     Problem List Patient Active Problem List   Diagnosis Date Noted   Influenza vaccine  needed 04/12/2020   Secondary esophageal varices without bleeding ([place]) 08/05/2019   Patellofemoral pain syndrome of both knees 08/05/2019   Lateral epicondylitis of both elbows 08/05/2019   Constipation 12/21/2017   Elevated blood pressure reading 12/04/2017   History of alcohol abuse 06/19/2017   Gastritis and gastroduodenitis    Umbilical hernia 0000000   Esophageal varices in alcoholic cirrhosis (  [place]) 123456   Alcoholic hepatitis 123456   Cirrhosis, alcoholic ([place]) [id number]    Dawayne Cirri, SPTA 09/07/2020, 9:23 AM  [place]. [place], [place], [address] Phone: [phone]   Fax:  [phone]  Name: Danny Arnold MRN: [id number] Date of Birth: December 28, 1975

## 2020-09-09 ENCOUNTER — Ambulatory Visit: Payer: Self-pay | Admitting: Physical Therapy

## 2020-09-09 ENCOUNTER — Other Ambulatory Visit: Payer: Self-pay

## 2020-09-09 DIAGNOSIS — M25621 Stiffness of right elbow, not elsewhere classified: Secondary | ICD-10-CM

## 2020-09-09 DIAGNOSIS — M25622 Stiffness of left elbow, not elsewhere classified: Secondary | ICD-10-CM

## 2020-09-09 DIAGNOSIS — M6281 Muscle weakness (generalized): Secondary | ICD-10-CM

## 2020-09-09 DIAGNOSIS — M25521 Pain in right elbow: Secondary | ICD-10-CM

## 2020-09-09 DIAGNOSIS — M25522 Pain in left elbow: Secondary | ICD-10-CM

## 2020-09-09 NOTE — Therapy (Signed)
Danny Arnold. [place], [place], [address] Phone: [phone]   Fax:  [phone]  Physical Therapy Treatment  Patient Details  Name: Danny Arnold MRN: [id number] Date of Birth: January 16, 1976 Referring Provider (PT): Danny Arnold Date: Arnold   PT End of Session - 09/09/20 0846     Visit Number 7    Date for PT Re-Evaluation 10/01/20    Authorization Type Self pay - CAFA until 09/21/20    PT Start Time 0755    PT Stop Time 0845    PT Time Calculation (min) 50 min             Past Medical History:  Diagnosis Date   Alcoholic hepatitis    Alcoholic liver disease ([place])    Anemia    Ascites 02/03/2013   Bacteremia     Past Surgical History:  Procedure Laterality Date   COLONOSCOPY N/A 11/25/2012   [facility]   ESOPHAGEAL BANDING N/A 04/08/2019   Procedure: ESOPHAGEAL BANDING;  Surgeon: Danny Binder, MD;  [facility];  Service: Endoscopy;  Laterality: N/A;   ESOPHAGOGASTRODUODENOSCOPY N/A 11/25/2012   GRADE 1 VARICES   ESOPHAGOGASTRODUODENOSCOPY N/A 04/20/2016   Procedure: ESOPHAGOGASTRODUODENOSCOPY (EGD);  Surgeon: Danny Binder, MD;  [facility];  Service: Endoscopy;  Laterality: N/A;  8:30am   ESOPHAGOGASTRODUODENOSCOPY (EGD) WITH PROPOFOL N/A 04/08/2019   Procedure: ESOPHAGOGASTRODUODENOSCOPY (EGD) WITH PROPOFOL;  Surgeon: Danny Binder, MD;  [facility];  Service: Endoscopy;  Laterality: N/A;  1:00pm   HAND SURGERY Left    removal of foreign body and tendon repair.   HEMORRHOID BANDING  8101 x1   UMBILICAL HERNIA REPAIR N/A 08/30/2015   Procedure: UMBILICAL HERNIORRHAPHY WITH MESH;  Surgeon: Danny Signs, MD;  Location: [facility] ORS;  Service: General;  Laterality: N/A;    There were no vitals filed for this visit.   Subjective Assessment - 09/09/20 0756     Subjective overall more than 60% better. still pain with straighting RT elbow. Still some pain BIL  elbows    Currently in Pain? No/denies                [facility] PT Assessment - 09/09/20 0001       AROM   Overall AROM Comments RT elbow 10-135,Left 5-140   Grip 90# BIL with cramping BIL at end range                          [facility] Adult PT Treatment/Exercise - 09/09/20 0001       Elbow Exercises   Elbow Extension Strengthening;Power [employer];Both;20 reps;Standing   25#   Other elbow exercises UBE L4  3 min each way   wt ball toss and trampoline catching ext and sup/pronation   Other elbow exercises Lat pulldown 2x10 2#, rows 25# 2x10   chest press 20# 2 sets 10. inclined push up 2 sets 10     Wrist Exercises   Wrist Flexion Strengthening;Both;20 reps   5#   Wrist Extension Strengthening;Both;20 reps   5#   Wrist Radial Deviation Strengthening;Both;20 reps   hammer   Wrist Ulnar Deviation Strengthening;Both;20 reps   hammer     Modalities   Modalities Iontophoresis      Iontophoresis   Type of Iontophoresis Dexamethasone    Location B Lateral epicondyles    Dose 1.2 cc Dex    Time 4 hour leave on patch   #  5     Manual Therapy   Manual Therapy Soft tissue mobilization;Passive ROM    Manual therapy comments wrist extensors    Passive ROM elbow ext after PROM left elbow ext 0 and RT 3                      PT Short Term Goals - 09/09/20 7989       PT SHORT TERM GOAL #2   Title FOTO to be assessed and appropriate goal created by visit #3    Baseline missed at eval    Status Deferred               PT Long Term Goals - 09/09/20 2119       PT LONG TERM GOAL #1   Title Symmetrical elbow ROM to [place] with </= 2/10 pain.    Status Partially Met      PT LONG TERM GOAL #2   Title Pt will demonstrate symmetrical BUE strength with </= 2/10 pain    Status Partially Met      PT LONG TERM GOAL #3   Title Pt will report 50% improvement in symptoms with work duties, weighted UE movements    Baseline 60% better    Status Achieved                    Plan - 09/09/20 0847     Clinical Impression Statement overall pt reports 60% better with pain. AROM improving BIL, elbow flex WNLS, lacking BIL ext but passively 0 on left and 3 on RT. Grip strength 90# BIL woth cramping BIL. Increased wt on ex and grip strength ex without issues,pt did need cuing verb and tactile for correct tech and prevent compensation.progressig with goals.    PT Treatment/Interventions ADLs/Self Care Home Management;Cryotherapy;Electrical Stimulation;Iontophoresis 52m/ml Dexamethasone;Moist Heat;Neuromuscular re-education;Therapeutic exercise;Therapeutic activities;Patient/family education;Manual techniques;Dry needling;Taping;Vasopneumatic Device    PT Next Visit Plan progress func ROM esp ext,strength and grip strength             Patient will benefit from skilled therapeutic intervention in order to improve the following deficits and impairments:  Impaired UE functional use, Increased muscle spasms, Pain, Hypomobility, Impaired flexibility, Decreased strength, Decreased mobility, Increased edema  Visit Diagnosis: Pain in left elbow  Pain in right elbow  Muscle weakness (generalized)  Stiffness of right elbow, not elsewhere classified  Stiffness of left elbow, not elsewhere classified     Problem List Patient Active Problem List   Diagnosis Date Noted   Influenza vaccine needed 04/12/2020   Secondary esophageal varices without bleeding ([place] 08/05/2019   Danny Arnold 08/05/2019   Lateral epicondylitis of both elbows 08/05/2019   Constipation 12/21/2017   Elevated blood pressure reading 12/04/2017   History of alcohol abuse 06/19/2017   Gastritis and gastroduodenitis    Umbilical hernia 041/74/0814  Esophageal varices in alcoholic cirrhosis ([place] 148/18/5631  Alcoholic hepatitis 149/70/2637  Cirrhosis, alcoholic ([place] 085/88/5027   Danny Arnold,Danny Arnold, 8:50 AM  [place] [address]Phone: [phone]  Fax:  [phone] Name: Danny BeitlerMRN: [id number] of Birth: [birth year]

## 2020-09-14 ENCOUNTER — Ambulatory Visit: Payer: Self-pay | Attending: Family Medicine | Admitting: Physical Therapy

## 2020-09-14 ENCOUNTER — Other Ambulatory Visit: Payer: Self-pay

## 2020-09-14 ENCOUNTER — Encounter (INDEPENDENT_AMBULATORY_CARE_PROVIDER_SITE_OTHER): Payer: Self-pay

## 2020-09-14 DIAGNOSIS — M25522 Pain in left elbow: Secondary | ICD-10-CM | POA: Insufficient documentation

## 2020-09-14 DIAGNOSIS — R600 Localized edema: Secondary | ICD-10-CM | POA: Insufficient documentation

## 2020-09-14 DIAGNOSIS — M25521 Pain in right elbow: Secondary | ICD-10-CM | POA: Insufficient documentation

## 2020-09-14 DIAGNOSIS — M6281 Muscle weakness (generalized): Secondary | ICD-10-CM | POA: Insufficient documentation

## 2020-09-14 DIAGNOSIS — M25621 Stiffness of right elbow, not elsewhere classified: Secondary | ICD-10-CM | POA: Insufficient documentation

## 2020-09-14 DIAGNOSIS — M25622 Stiffness of left elbow, not elsewhere classified: Secondary | ICD-10-CM | POA: Insufficient documentation

## 2020-09-14 NOTE — Therapy (Signed)
[place]. [place], [place], [address] Phone: [phone]   Fax:  [phone]  Physical Therapy Treatment  Patient Details  Name: Danny Arnold MRN: [id number] Date of Birth: December 26, 1975 Referring Provider (PT): Marikay Alar Date: 09/14/2020   PT End of Session - 09/14/20 0816     Visit Number 8    Date for PT Re-Evaluation 10/01/20    Authorization Type Self pay - CAFA until 09/21/20    PT Start Time 0748    PT Stop Time 0826    PT Time Calculation (min) 38 min             Past Medical History:  Diagnosis Date   Alcoholic hepatitis    Alcoholic liver disease ([place])    Anemia    Ascites 02/03/2013   Bacteremia     Past Surgical History:  Procedure Laterality Date   COLONOSCOPY N/A 11/25/2012   [facility]   ESOPHAGEAL BANDING N/A 04/08/2019   Procedure: ESOPHAGEAL BANDING;  Surgeon: Danie Binder, MD;  [facility];  Service: Endoscopy;  Laterality: N/A;   ESOPHAGOGASTRODUODENOSCOPY N/A 11/25/2012   GRADE 1 VARICES   ESOPHAGOGASTRODUODENOSCOPY N/A 04/20/2016   Procedure: ESOPHAGOGASTRODUODENOSCOPY (EGD);  Surgeon: Danie Binder, MD;  [facility];  Service: Endoscopy;  Laterality: N/A;  8:30am   ESOPHAGOGASTRODUODENOSCOPY (EGD) WITH PROPOFOL N/A 04/08/2019   Procedure: ESOPHAGOGASTRODUODENOSCOPY (EGD) WITH PROPOFOL;  Surgeon: Danie Binder, MD;  [facility];  Service: Endoscopy;  Laterality: N/A;  1:00pm   HAND SURGERY Left    removal of foreign body and tendon repair.   HEMORRHOID BANDING  3474 x1   UMBILICAL HERNIA REPAIR N/A 08/30/2015   Procedure: UMBILICAL HERNIORRHAPHY WITH MESH;  Surgeon: Aviva Signs, MD;  Location: [facility] ORS;  Service: General;  Laterality: N/A;    There were no vitals filed for this visit.   Subjective Assessment - 09/14/20 0750     Subjective doing good    Currently in Pain? No/denies                                [facility] Adult PT Treatment/Exercise - 09/14/20 0001       Elbow Exercises   Elbow Flexion Power [employer];Both;20 reps;Strengthening   25#   Elbow Extension Strengthening;Power [employer];Both;20 reps;Standing   35#.   Theraband Level (Elbow Extension) Level 3 (Green)   2 sets 10   Other elbow exercises UBE L4  3 min each way    Other elbow exercises Lat pulldown 2x10 25#, rows 25# 2x10   chest press 25# 2 sets 10     Wrist Exercises   Wrist Flexion Strengthening;Both;15 reps   6#   Wrist Extension Strengthening;Both;15 reps   6#   Wrist Radial Deviation Strengthening;Both;15 reps   6#   Wrist Ulnar Deviation Strengthening;Both;15 reps   6#   Other wrist exercises velcro board      Modalities   Modalities Iontophoresis      Iontophoresis   Type of Iontophoresis Dexamethasone    Location B Lateral epicondyles    Dose 1.2 cc Dex    Time 4 hour leave on patch   #6     Manual Therapy   Manual Therapy Soft tissue mobilization;Passive ROM    Manual therapy comments wrist extensors    Passive ROM elbow ext  PT Short Term Goals - 09/09/20 9217       PT SHORT TERM GOAL #2   Title FOTO to be assessed and appropriate goal created by visit #3    Baseline missed at eval    Status Deferred               PT Long Term Goals - 09/09/20 8375       PT LONG TERM GOAL #1   Title Symmetrical elbow ROM to [facility] with </= 2/10 pain.    Status Partially Met      PT LONG TERM GOAL #2   Title Pt will demonstrate symmetrical BUE strength with </= 2/10 pain    Status Partially Met      PT LONG TERM GOAL #3   Title Pt will report 50% improvement in symptoms with work duties, weighted UE movements    Baseline 60% better    Status Achieved                   Plan - 09/14/20 0816     Clinical Impression Statement increased ther ex wt without pain. increased shld ext but cued to keep shld back as he is rounded. less  tenderness and tightness noted with STW.    PT Treatment/Interventions ADLs/Self Care Home Management;Cryotherapy;Electrical Stimulation;Iontophoresis 72m/ml Dexamethasone;Moist Heat;Neuromuscular re-education;Therapeutic exercise;Therapeutic activities;Patient/family education;Manual techniques;Dry needling;Taping;Vasopneumatic Device    PT Next Visit Plan progress func ROM esp ext,strength and grip strength             Patient will benefit from skilled therapeutic intervention in order to improve the following deficits and impairments:  Impaired UE functional use, Increased muscle spasms, Pain, Hypomobility, Impaired flexibility, Decreased strength, Decreased mobility, Increased edema  Visit Diagnosis: Pain in left elbow  Pain in right elbow  Muscle weakness (generalized)     Problem List Patient Active Problem List   Diagnosis Date Noted   Influenza vaccine needed 04/12/2020   Secondary esophageal varices without bleeding ([place] 08/05/2019   Patellofemoral pain syndrome of both knees 08/05/2019   Lateral epicondylitis of both elbows 08/05/2019   Constipation 12/21/2017   Elevated blood pressure reading 12/04/2017   History of alcohol abuse 06/19/2017   Gastritis and gastroduodenitis    Umbilical hernia 042/37/0230  Esophageal varices in alcoholic cirrhosis ([place] 117/20/9106  Alcoholic hepatitis 181/66/1969  Cirrhosis, alcoholic ([place] 040/98/2867   Clark Cuff,[name] PTA 09/14/2020, 8:18 AM  [place] [address]Phone: [phone]  Fax:  [phone] Name: LGearald StonebrakerMRN: [id number] of Birth: 5Feb 01, 1977

## 2020-09-16 ENCOUNTER — Ambulatory Visit: Payer: Self-pay | Admitting: Physical Therapy

## 2020-09-16 ENCOUNTER — Other Ambulatory Visit: Payer: Self-pay

## 2020-09-16 DIAGNOSIS — M6281 Muscle weakness (generalized): Secondary | ICD-10-CM

## 2020-09-16 DIAGNOSIS — M25521 Pain in right elbow: Secondary | ICD-10-CM

## 2020-09-16 DIAGNOSIS — M25522 Pain in left elbow: Secondary | ICD-10-CM

## 2020-09-16 NOTE — Therapy (Signed)
[place] [place], [place], [address] Phone: [phone]   Fax:  [phone]  Physical Therapy Treatment  Patient Details  Name: Danny Arnold MRN: [id number] Date of Birth: 1976/01/22 Referring Provider (PT): Marikay Alar Date: 09/16/2020   PT End of Session - 09/16/20 0819     Visit Number 9    Date for PT Re-Evaluation 10/01/20    Authorization Type Self pay - CAFA until 09/21/20    PT Start Time 6754    PT Stop Time 0835    PT Time Calculation (min) 42 min             Past Medical History:  Diagnosis Date   Alcoholic hepatitis    Alcoholic liver disease ([place])    Anemia    Ascites 02/03/2013   Bacteremia     Past Surgical History:  Procedure Laterality Date   COLONOSCOPY N/A 11/25/2012   [facility]   ESOPHAGEAL BANDING N/A 04/08/2019   Procedure: ESOPHAGEAL BANDING;  Surgeon: Danie Binder, MD;  [facility];  Service: Endoscopy;  Laterality: N/A;   ESOPHAGOGASTRODUODENOSCOPY N/A 11/25/2012   GRADE 1 VARICES   ESOPHAGOGASTRODUODENOSCOPY N/A 04/20/2016   Procedure: ESOPHAGOGASTRODUODENOSCOPY (EGD);  Surgeon: Danie Binder, MD;  [facility];  Service: Endoscopy;  Laterality: N/A;  8:30am   ESOPHAGOGASTRODUODENOSCOPY (EGD) WITH PROPOFOL N/A 04/08/2019   Procedure: ESOPHAGOGASTRODUODENOSCOPY (EGD) WITH PROPOFOL;  Surgeon: Danie Binder, MD;  [facility];  Service: Endoscopy;  Laterality: N/A;  1:00pm   HAND SURGERY Left    removal of foreign body and tendon repair.   HEMORRHOID BANDING  4920 x1   UMBILICAL HERNIA REPAIR N/A 08/30/2015   Procedure: UMBILICAL HERNIORRHAPHY WITH MESH;  Surgeon: Aviva Signs, MD;  Location: [facility];  Service: General;  Laterality: N/A;    There were no vitals filed for this visit.   Subjective Assessment - 09/16/20 0754     Subjective doing good    Currently in Pain? No/denies                [facility] PT Assessment  - 09/16/20 0001       AROM   Overall AROM Comments RT elbow 5- 135,Left 2-140   grip 90 BIL without pain or cramping                          [facility] Adult PT Treatment/Exercise - 09/16/20 0001       Elbow Exercises   Elbow Flexion Power Tower;Both;Strengthening;15 reps   2 sets 35#   Elbow Extension Strengthening;Power Tower;Both;Standing;15 reps   2 sets 35#   Theraband Level (Elbow Extension) Level 4 (Blue)   2 sets 10   Other elbow exercises UBE L5  3 min each way    Other elbow exercises Lat pulldown 2x15 25#, rows 25# 2x15   chest press 35# 2 sets 15     Wrist Exercises   Wrist Flexion Strengthening;Both;15 reps   7#   Wrist Extension Strengthening;Both;15 reps    Theraband Level (Wrist Extension) --   7#   Wrist Radial Deviation Strengthening;Both;15 reps   7#   Wrist Ulnar Deviation Strengthening;Both;15 reps   7#   Other wrist exercises grip strengthening                      PT Short Term Goals - 09/09/20 1007  PT SHORT TERM GOAL #2   Title FOTO to be assessed and appropriate goal created by visit #3    Baseline missed at eval    Status Deferred               PT Long Term Goals - 09/16/20 0818       PT LONG TERM GOAL #1   Title Symmetrical elbow ROM to [facility] with </= 2/10 pain.    Status Partially Met      PT LONG TERM GOAL #2   Title Pt will demonstrate symmetrical BUE strength with </= 2/10 pain    Status Partially Met      PT LONG TERM GOAL #3   Title Pt will report 50% improvement in symptoms with work duties, weighted UE movements    Status Achieved                   Plan - 09/16/20 0820     Clinical Impression Statement tolerated all ex without pain and increase in wt and reps. ext improving. grip same but no pain/cramping. progressing with goals    PT Treatment/Interventions ADLs/Self Care Home Management;Cryotherapy;Electrical Stimulation;Iontophoresis 56m/ml Dexamethasone;Moist Heat;Neuromuscular  re-education;Therapeutic exercise;Therapeutic activities;Patient/family education;Manual techniques;Dry needling;Taping;Vasopneumatic Device    PT Next Visit Plan D/C next session             Patient will benefit from skilled therapeutic intervention in order to improve the following deficits and impairments:  Impaired UE functional use, Increased muscle spasms, Pain, Hypomobility, Impaired flexibility, Decreased strength, Decreased mobility, Increased edema  Visit Diagnosis: Pain in left elbow  Pain in right elbow  Muscle weakness (generalized)     Problem List Patient Active Problem List   Diagnosis Date Noted   Influenza vaccine needed 04/12/2020   Secondary esophageal varices without bleeding ([place] 08/05/2019   Patellofemoral pain syndrome of both knees 08/05/2019   Lateral epicondylitis of both elbows 08/05/2019   Constipation 12/21/2017   Elevated blood pressure reading 12/04/2017   History of alcohol abuse 06/19/2017   Gastritis and gastroduodenitis    Umbilical hernia 070/96/4383  Esophageal varices in alcoholic cirrhosis ([place] 181/84/0375  Alcoholic hepatitis 143/60/6770  Cirrhosis, alcoholic ([place] 034/04/5246   Danny Arnold,Danny Arnold PTA 09/16/2020, 8:22 AM  [place] [address]Phone: [phone]  Fax:  [phone] Name: Danny DemetriouMRN: [id number] of Birth: 506-22-1977

## 2020-09-17 ENCOUNTER — Ambulatory Visit: Payer: Self-pay | Attending: Internal Medicine

## 2020-09-20 ENCOUNTER — Other Ambulatory Visit: Payer: Self-pay

## 2020-09-20 ENCOUNTER — Ambulatory Visit: Payer: Self-pay

## 2020-09-20 DIAGNOSIS — R600 Localized edema: Secondary | ICD-10-CM

## 2020-09-20 DIAGNOSIS — M25622 Stiffness of left elbow, not elsewhere classified: Secondary | ICD-10-CM

## 2020-09-20 DIAGNOSIS — M6281 Muscle weakness (generalized): Secondary | ICD-10-CM

## 2020-09-20 DIAGNOSIS — M25522 Pain in left elbow: Secondary | ICD-10-CM

## 2020-09-20 DIAGNOSIS — M25621 Stiffness of right elbow, not elsewhere classified: Secondary | ICD-10-CM

## 2020-09-20 DIAGNOSIS — M25521 Pain in right elbow: Secondary | ICD-10-CM

## 2020-09-20 NOTE — Patient Instructions (Signed)
Access Code: [email or web address] URL: [email or web address] Date: 09/20/2020 Prepared by: Sherlynn Stalls  Program Notes utilice la banda verde para ejercicios de codo y la banda roja para ejercicios de [place].    Exercises Standing Single Arm Elbow Flexion with Resistance - 1 x daily - 7 x weekly - 2 sets - 15 reps Standing Elbow Extension with Self-Anchored Resistance - 1 x daily - 7 x weekly - 3 sets - 10 reps Wrist Flexion with Resistance - 1 x daily - 7 x weekly - 2 sets - 10 reps Wrist Extension with Resistance - 1 x daily - 7 x weekly - 2 sets - 10 reps Wrist Ulnar Deviation with Resistance - 1 x daily - 7 x weekly - 2 sets - 10 reps Seated Wrist Radial Deviation with Anchored Resistance - 1 x daily - 7 x weekly - 2 sets - 10 reps Standing Bilateral Low Shoulder Row with Anchored Resistance - 1 x daily - 7 x weekly - 3 sets - 10 reps

## 2020-09-20 NOTE — Progress Notes (Signed)
I, Wendy Poet, LAT, ATC, am serving as scribe for Dr. Lynne Leader.  Danny Arnold is a 45 y.o. male who presents to [place] [facility] today for f/u of B elbow/arm pain.  He was last seen by Dr. Georgina Snell on 08/10/20 and was referred to PT of which he has completed 10 sessions and has been d/c as of 09/20/20.  He was previously seen by neurology and had a B UE NCV/EMG on 07/13/20.  Since his last visit, pt reports that he feels 50% improved.  He is now able to fully extend both elbow and he reports less pain w/ gripping activities.  He states that he stopped taking the vit B12.  He has been provided a HEP per PT.  Main issue today is a persistent soreness in his right lateral dorsal forearm.  Diagnostic imaging: B UE NCV/EMG- 07/13/20  Pertinent review of systems: No fevers or chills  Relevant historical information: Cirrhosis   Exam:  BP 110/86 (BP Location: Right Arm, Patient Position: Sitting, Cuff Size: Normal)   Pulse 82   Ht '5\' 8"'$  (1.727 m)   Wt 172 lb (78 kg)   SpO2 96%   BMI 26.15 kg/m  General: Well Developed, well nourished, and in no acute distress.   MSK: Right arm normal-appearing Mildly tender palpation right dorsal forearm.   Elbow motion and strength are intact.  Unable to reproduce pain with resisted wrist motion or strength testing.    Lab and Radiology Results  Nerve conduction study Jul 13, 2020 NCV & EMG Findings: Extensive electrodiagnostic testing of the right upper extremity and additional studies of the left shows: Bilateral median, ulnar, and mixed palmar sensory responses are within normal limits. Bilateral median and ulnar motor responses are within normal limits. There is no evidence of active or chronic motor axonal loss changes affecting any of the tested muscles.  Motor unit configuration and recruitment pattern is within normal limits.   Impression: This is a normal study of the upper extremities.  In  particular, there is no evidence of carpal tunnel syndrome or a cervical radiculopathy.      Assessment and Plan: 45 y.o. male with bilateral elbow pain and forearm pain significantly improved with trial of hand physical therapy/OT.  Patient has had dramatic improvement but still has some residual pain in his right dorsal lateral forearm.  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 This would fit with pain that is unable to be reproduced by resisted strength testing and pain that does not produce paresthesias distally and is often missed on a nerve conduction study.  Since he has had some significant improvement with hand PT is reasonable to give this some time and continued home exercise program.  Plan to continue home exercise program and check back in 3 months.  If still persistently bothersome at that point probably the next step is a trial of a diagnostic and therapeutic targeted injection with ultrasound guidance (for radial tunnel at [address] fro[address]) which I can perform in clinic.   Discussed warning signs or symptoms. Please see discharge instructions. Patient expresses understanding.   The above documentation has been reviewed and is accurate and complete Lynne Leader, M.D.  Total encounter time 30 minutes including face-to-face time with the patient and, reviewing past medical record, and charting on the date of service.   Discussed treatment plan and options.  Reviewed anatomy and future plans.  Visit conducted using a Spanish interpreter.

## 2020-09-20 NOTE — Therapy (Signed)
[address]. [place], [place], [address] Phone: [phone]   Fax:  [phone]  Physical Therapy Treatment/Discharge summary 10th visit Progress Note Reporting Period 08/20/20 to 09/20/20  See note below for Objective Data and Assessment of Progress/Goals.      Patient Details  Name: Danny Arnold MRN: [id number] Date of Birth: [birth year] Referring Provider (PT): Marikay Alar Date: 09/20/2020   PT End of Session - 09/20/20 0956     Visit Number 10    Date for PT Re-Evaluation 10/01/20    Authorization Type Self pay - CAFA until 09/21/20    PT Start Time 0958    PT Stop Time 1036    PT Time Calculation (min) 38 min    Activity Tolerance Patient tolerated treatment well    Behavior During Therapy [facility] for tasks assessed/performed             Past Medical History:  Diagnosis Date   Alcoholic hepatitis    Alcoholic liver disease ([place])    Anemia    Ascites 02/03/2013   Bacteremia     Past Surgical History:  Procedure Laterality Date   COLONOSCOPY N/A 11/25/2012   [facility]   ESOPHAGEAL BANDING N/A 04/08/2019   Procedure: ESOPHAGEAL BANDING;  Surgeon: Danie Binder, MD;  [facility];  Service: Endoscopy;  Laterality: N/A;   ESOPHAGOGASTRODUODENOSCOPY N/A 11/25/2012   GRADE 1 VARICES   ESOPHAGOGASTRODUODENOSCOPY N/A 04/20/2016   Procedure: ESOPHAGOGASTRODUODENOSCOPY (EGD);  Surgeon: Danie Binder, MD;  [facility];  Service: Endoscopy;  Laterality: N/A;  8:30am   ESOPHAGOGASTRODUODENOSCOPY (EGD) WITH PROPOFOL N/A 04/08/2019   Procedure: ESOPHAGOGASTRODUODENOSCOPY (EGD) WITH PROPOFOL;  Surgeon: Danie Binder, MD;  [facility];  Service: Endoscopy;  Laterality: N/A;  1:00pm   HAND SURGERY Left    removal of foreign body and tendon repair.   HEMORRHOID BANDING  9449 x1   UMBILICAL HERNIA REPAIR N/A 08/30/2015   Procedure: UMBILICAL HERNIORRHAPHY WITH MESH;   Surgeon: Aviva Signs, MD;  Location: [facility];  Service: General;  Laterality: N/A;    There were no vitals filed for this visit.   Subjective Assessment - 09/20/20 1000     Subjective doing good, not getting cramps when gripping as much    Currently in Pain? Yes    Pain Score 2     Pain Location Hand    Pain Orientation Right                [facility] PT Assessment - 09/20/20 0001       Assessment   Medical Diagnosis M79.601,M79.602 (ICD-10-CM) - Bilateral arm pain  K70.30 (QPR-91-MB) - Alcoholic cirrhosis of liver without ascites ([place])    Referring Provider (PT) Georgina Snell    Hand Dominance Right      AROM   Overall AROM Comments RT elbow 5- 135,Left 2-140   grip 90 BIL without pain or cramping           BUE strength 4+/5 with minimal pain, no intense cramping               [facility] Adult PT Treatment/Exercise - 09/20/20 0001       Elbow Exercises   Elbow Flexion Power Tower;Both;Strengthening;15 reps   2 sets 35#   Elbow Extension Strengthening;Power Tower;Both;Standing;15 reps   2 sets 35#   Other elbow exercises UBE L5  3 min each way    Other elbow exercises Lat pulldown  2x15 25#, rows 25# 2x15   chest press 35# 2 sets 15     Wrist Exercises   Wrist Flexion Strengthening;Both;15 reps   red   Wrist Extension Strengthening;Both;15 reps    Theraband Level (Wrist Extension) --   red   Wrist Radial Deviation Strengthening;Both;15 reps   red   Wrist Ulnar Deviation Strengthening;Both;15 reps   red   Other wrist exercises --    Other wrist exercises wirst extension stretches 20" x 2 B. Wrist flexion stretch 20" x 2 B                    PT Education - 09/20/20 1034     Education Details Discharge HEP provided. Access Code: [email or web address]    Person(s) Educated Patient    Methods Explanation;Demonstration;Handout    Comprehension Verbalized understanding;Returned demonstration              PT Short Term Goals - 09/20/20 1003       PT SHORT TERM  GOAL #1   Title Independent with initial HEP    Status Achieved      PT SHORT TERM GOAL #2   Title FOTO to be assessed and appropriate goal created by visit #3    Baseline missed at eval due to inability to access    Status Deferred               PT Long Term Goals - 09/20/20 1035       PT LONG TERM GOAL #1   Title Symmetrical elbow ROM to [facility] with </= 2/10 pain.    Status Partially Met   minimal pain, still asymmetricla but improved from initial eval, no longer cramping     PT LONG TERM GOAL #2   Title Pt will demonstrate symmetrical BUE strength with </= 2/10 pain    Status Achieved      PT LONG TERM GOAL #3   Title Pt will report 50% improvement in symptoms with work duties, weighted UE movements    Status Achieved                   Plan - 09/20/20 1001     Clinical Impression Statement Pt has overall made excellent progress towards goals and with progression of PT exercises. Given progress he is appropriate for discharge to independent home program at this time. He was able to reverse demonstrate all exercises nicely wit min initial cues provided.    PT Treatment/Interventions ADLs/Self Care Home Management;Cryotherapy;Electrical Stimulation;Iontophoresis 21m/ml Dexamethasone;Moist Heat;Neuromuscular re-education;Therapeutic exercise;Therapeutic activities;Patient/family education;Manual techniques;Dry needling;Taping;Vasopneumatic Device    PT Next Visit Plan D/C to HEP             Patient will benefit from skilled therapeutic intervention in order to improve the following deficits and impairments:  Impaired UE functional use, Increased muscle spasms, Pain, Hypomobility, Impaired flexibility, Decreased strength, Decreased mobility, Increased edema  Visit Diagnosis: Pain in left elbow  Pain in right elbow  Muscle weakness (generalized)  Stiffness of right elbow, not elsewhere classified  Stiffness of left elbow, not elsewhere  classified  Localized edema     Problem List Patient Active Problem List   Diagnosis Date Noted   Influenza vaccine needed 04/12/2020   Secondary esophageal varices without bleeding ([place] 08/05/2019   Patellofemoral pain syndrome of both knees 08/05/2019   Lateral epicondylitis of both elbows 08/05/2019   Constipation 12/21/2017   Elevated blood pressure reading 12/04/2017   History of alcohol abuse 06/19/2017  Gastritis and gastroduodenitis    Umbilical hernia 44/96/7591   Esophageal varices in alcoholic cirrhosis ([place]) 63/84/6659   Alcoholic hepatitis 93/57/0177   Cirrhosis, alcoholic ([place]) 93/90/3009   PHYSICAL THERAPY DISCHARGE SUMMARY  Visits from Start of Care: 10  Plan: Patient agrees to discharge.  Patient goals were met. Patient is being discharged due to meeting the stated rehab goals and being pleased with current functional status.      Hall Busing, PT, DPT 09/20/2020, 10:38 AM  [address]. [place], [place], [address] Phone: [phone]   Fax:  [phone]  Name: Jonuel Butterfield MRN: [id number] Date of Birth: 10-27-1975

## 2020-09-21 ENCOUNTER — Other Ambulatory Visit: Payer: Self-pay

## 2020-09-21 ENCOUNTER — Ambulatory Visit (INDEPENDENT_AMBULATORY_CARE_PROVIDER_SITE_OTHER): Payer: Self-pay | Admitting: Family Medicine

## 2020-09-21 ENCOUNTER — Encounter: Payer: Self-pay | Admitting: Family Medicine

## 2020-09-21 VITALS — BP 110/86 | HR 82 | Ht 68.0 in | Wt 172.0 lb

## 2020-09-21 DIAGNOSIS — M79602 Pain in left arm: Secondary | ICD-10-CM

## 2020-09-21 DIAGNOSIS — G5631 Lesion of radial nerve, right upper limb: Secondary | ICD-10-CM

## 2020-09-21 DIAGNOSIS — M79601 Pain in right arm: Secondary | ICD-10-CM

## 2020-09-21 NOTE — Patient Instructions (Signed)
Thank you for coming in today.   Return in 3 months.  Spanish interperter.   Regresse con 3 semanas.   Sndrome del tnel radial Radial Tunnel Syndrome  El sndrome del tnel radial es una afeccin que causa dolor en la mano y en el brazo. Se produce cuando se aprieta (se comprime) el nervio que se extiende desde atrs de la parte superior del brazo hasta el antebrazo (nervio radial). Por lo general, la afeccin se debe a una inflamacin o hinchazn, unalesin o un tumor que presiona el nervio. Cules son las causas? Esta afeccin puede ser causada por lo siguiente: El uso repetido y excesivo del Management consultant, especialmente para torcer o Pharmacist, hospital. Inflamacin muscular. Una lesin. Una masa de tejido nervioso (ganglios). Tumores grasos no cancerosos (lipomas). Un tumor seo. Qu incrementa el riesgo? Los siguientes factores pueden hacer que sea ms propenso a Emergency planning/management officer afeccin: Sales promotion account executive los que se debe mover mucho el antebrazo, como el tenis. Tener un trabajo que implica usar mucho el antebrazo, como algunos trabajos en las fbricas. Cules son los signos o sntomas? Los sntomas de esta afeccin incluyen: Dolor continuo o punzante en la parte superior del antebrazo, el dorso de la mano o el costado del codo. Dolor al extender la [employer] o los dedos de la [place]. Debilidad en la [place] y los msculos del antebrazo. Cmo se diagnostica? Esta afeccin se puede diagnosticar en funcin de lo siguiente: Los sntomas y los antecedentes mdicos. Un examen fsico. Es posible que le pidan que mueva la [place], los dedos, la [place] y el brazo de determinadas maneras para que el mdico pueda Designer, jewellery el origen del Social research officer, government. Pruebas para descartar otras afecciones. Estas pruebas pueden incluir lo siguiente: Un electromiograma (EMG). Este estudio muestra si el nervio radial est funcionando como corresponde. Un estudio de [employer]. Este estudio permite determinar si  las seales elctricas pasan correctamente por los nervios. Una resonancia magntica (RM). Este estudio examina las causas de los Fisher [employer] nervios, por ejemplo, cicatrices de una lesin, presin en un nervio o un tumor. Una ecografa. Este estudio muestra determinadas lesiones, como desgarros en los ligamentos o los tendones. Cmo se trata? El tratamiento de esta afeccin puede incluir lo siguiente: Product/process development scientist las actividades que empeoren o [employer] sntomas. Tomar corticoesteroides o antiinflamatorios, como ibuprofeno, para ayudar con los sntomas. Usar una frula o un dispositivo ortopdico que brinden soporte hasta que los sntomas desaparezcan. Hacer ejercicios para recuperar la fuerza y [employer] movilidad y la amplitud de movimientos en la mano y el brazo. Someterse a una ciruga para eliminar la presin del nervio radial. Por lo general, la ciruga no es necesaria, a no ser que otros tratamientos fracasen y los sntomas duren ms de 3 meses. Siga estas indicaciones en su casa: Si tiene una frula o un dispositivo ortopdico: Use la frula o el dispositivo ortopdico como se lo haya indicado el mdico. Quteselo solamente como se lo haya indicado el mdico. Afloje la frula o el dispositivo ortopdico si los dedos de las manos se le adormecen, siente hormigueos o se le enfran y se tornan de Optician, dispensing. Mantenga la frula o el dispositivo ortopdico limpios. Si la frula o el dispositivo ortopdico no son impermeables: No deje que se mojen. Cbralo con un envoltorio hermtico cuando tome un bao de inmersin o una ducha. Actividad Descanse el brazo de toda actividad que le cause dolor. Retome sus actividades normales como se lo haya indicado el mdico.  Pregntele al mdico qu actividades son seguras para usted. Haga ejercicio como se lo haya indicado el mdico. Control del dolor y de la hinchazn     Si se lo indican, aplique hielo sobre la zona dolorida. Ponga el hielo  en una bolsa plstica. Coloque una toalla entre la piel y Therapist, nutritional. Coloque el hielo durante 20 minutos, 2 a 3 veces por da. Si se lo indican, aplique calor en la zona afectada con la frecuencia que le haya indicado el mdico. Use la fuente de calor que el mdico le recomiende, como una compresa de calor hmedo o una almohadilla trmica. Coloque una toalla entre la piel y la fuente de Freight forwarder. Aplique calor durante 20 a 30 minutos. Retire la fuente de calor si la piel se pone de color rojo brillante. Esto es especialmente importante si no puede sentir dolor, calor o fro. Puede correr un riesgo mayor de sufrir quemaduras. Indicaciones generales [employer]phi de venta libre y los recetados solamente como se lo haya indicado el mdico. No consuma ningn producto que contenga nicotina o tabaco, como cigarrillos y Psychologist, sport and exercise. Si necesita ayuda para dejar de fumar, consulte al mdico. Concurra a todas las visitas de seguimiento como se lo haya indicado el mdico. Esto es importante. Cmo se evita? Siga estos pasos para evitar una nueva lesin: Precaliente y elongue adecuadamente antes de hacer actividad fsica. Reljese y elongue despus de hacer actividad fsica. Dele al cuerpo tiempo para [employer] perodos de [place] fsica. Asegrese de usar el equipo que sea apto para usted. Utilice un equipo protector, como coderas. Tome medidas de seguridad y sea responsable al hacer actividad fsica. Esto ayudar a evitar cadas. Haga por lo menos 150 minutos de ejercicios de intensidad moderada cada semana, como caminar a Catering manager o hacer gimnasia acutica. Mantenga un buen estado fsico, que incluye lo siguiente: [place]. Flexibilidad. Buena capacidad cardiovascular. Resistencia. Comunquese con un mdico si: Los sntomas no mejoran en 2 semanas. Sus sntomas empeoran. La [place] se debilita o queda colgando. Solicite ayuda de inmediato si: El dolor es intenso. No  puede mover una parte de la mano o el brazo. Resumen El sndrome del tnel radial es una afeccin que causa dolor en la mano y en el brazo. Por lo general, la afeccin se debe a una inflamacin o hinchazn, una lesin o un tumor que presiona el nervio. El tratamiento puede incluir evitar las actividades que causan que los sntomas empeoren y tomar medicamentos. Retome sus actividades normales como se lo haya indicado el mdico. Pregntele al mdico qu actividades son seguras para usted. Esta informacin no tiene Marine scientist el consejo del mdico. Asegresede hacerle al mdico cualquier pregunta que tenga. Document Revised: 07/06/2017 Document Reviewed: 07/06/2017 Elsevier Patient Education  [place].

## 2020-09-22 ENCOUNTER — Ambulatory Visit: Payer: Self-pay | Attending: Internal Medicine

## 2020-09-23 ENCOUNTER — Ambulatory Visit: Payer: Self-pay | Attending: Internal Medicine | Admitting: Internal Medicine

## 2020-09-23 ENCOUNTER — Other Ambulatory Visit: Payer: Self-pay

## 2020-09-23 DIAGNOSIS — M7701 Medial epicondylitis, right elbow: Secondary | ICD-10-CM

## 2020-09-23 DIAGNOSIS — M7712 Lateral epicondylitis, left elbow: Secondary | ICD-10-CM

## 2020-09-23 NOTE — Progress Notes (Signed)
Patient ID: Danny Arnold, male   DOB: 1976-02-05, 45 y.o.   MRN: [id number] Virtual Visit via Telephone Note  I connected with Danny Arnold on 09/23/2020 at 11:34 a.m by telephone and verified that I am speaking with the correct person using two identifiers  Location: Patient: home Provider: office  Participants: Myself Patient [place] interpreter: Genia Harold [phone], (initial interpreter used to leave VMM this a.m when pt was not reached on call that was done 9:02 a.m- Seth Bake [id number])   I discussed the limitations, risks, security and privacy concerns of performing an evaluation and management service by telephone and the availability of in person appointments. I also discussed with the patient that there may be a patient responsible charge related to this service. The patient expressed understanding and agreed to proceed.   History of Present Illness: Hx of ETOH cirrhosis, esophageal varices.  Last seen 03/2020.    On last visit, pt c/o weakness in hands.  Exam was normal.  He was referred to neurology.  He saw Dr. Posey Pronto.  Her assessment was that he does not have a peripheral neuropathy but most likely tendinopathy of his hands.  EMG was normal.  He was referred to sports medicine.  He saw Dr. Georgina Snell.  His assessment was that the patient's symptoms corresponds with medial epicondylitis on the right and lateral epicondylitis on the left and mild distal biceps tendinitis bilaterally.  Patient referred to physical therapy. Completed P.T 2 days ago.  He notes significant improvement.   He was found to have Vit B12 in low nl range (383) after last visit with me.  I recommended taking OTC vit B12.  He took for several wks but felt it caused cramps in hands so he discontinued taking.    Observations/Objective: No direct observation done as this is a telephone visit Results for orders placed or performed in visit on 08/10/20  CK  Result Value Ref Range   Total CK 54 7 -  232 U/L  ANA  Result Value Ref Range   Anti Nuclear Antibody (ANA) NEGATIVE NEGATIVE  Cyclic citrul peptide antibody, IgG  Result Value Ref Range   Cyclic Citrullin Peptide Ab <16 UNITS  Sedimentation rate  Result Value Ref Range   Sed Rate 13 0 - 15 mm/hr  Rheumatoid factor  Result Value Ref Range   Rhuematoid fact SerPl-aCnc <14 <14 IU/mL   Assessment and Plan: 1. Medial epicondylitis of right elbow 2. Lateral epicondylitis of left elbow Patient reports marked improvement in his symptoms with physical therapy which he has completed.  Advised to follow-up with [place] as needed.   Follow Up Instructions: PRN   I discussed the assessment and treatment plan with the patient. The patient was provided an opportunity to ask questions and all were answered. The patient agreed with the plan and demonstrated an understanding of the instructions.   The patient was advised to call back or seek an in-person evaluation if the symptoms worsen or if the condition fails to improve as anticipated.  I  Spent 13 minutes on this telephone encounter  Karle Plumber, MD

## 2020-09-28 ENCOUNTER — Other Ambulatory Visit (HOSPITAL_COMMUNITY)
Admission: RE | Admit: 2020-09-28 | Discharge: 2020-09-28 | Disposition: A | Discharge status: Home / Self Care | Payer: Self-pay | Source: Ambulatory Visit | Attending: Gastroenterology | Admitting: Gastroenterology

## 2020-09-28 ENCOUNTER — Ambulatory Visit: Payer: Self-pay | Admitting: Nurse Practitioner

## 2020-09-28 ENCOUNTER — Other Ambulatory Visit: Payer: Self-pay

## 2020-09-28 ENCOUNTER — Encounter: Payer: Self-pay | Admitting: Gastroenterology

## 2020-09-28 ENCOUNTER — Ambulatory Visit (INDEPENDENT_AMBULATORY_CARE_PROVIDER_SITE_OTHER): Payer: Self-pay | Admitting: Gastroenterology

## 2020-09-28 VITALS — BP 121/84 | HR 64 | Temp 97.7°F | Ht 68.0 in | Wt 176.0 lb

## 2020-09-28 DIAGNOSIS — K7030 Alcoholic cirrhosis of liver without ascites: Secondary | ICD-10-CM

## 2020-09-28 LAB — CBC WITH DIFFERENTIAL/PLATELET
Abs Immature Granulocytes: 0.03 10*3/uL (ref 0.00–0.07)
Basophils Absolute: 0 10*3/uL (ref 0.0–0.1)
Basophils Relative: 1 %
Eosinophils Absolute: 0.1 10*3/uL (ref 0.0–0.5)
Eosinophils Relative: 2 %
HCT: 51.3 % (ref 39.0–52.0)
Hemoglobin: 17.2 g/dL — ABNORMAL HIGH (ref 13.0–17.0)
Immature Granulocytes: 0 %
Lymphocytes Relative: 27 %
Lymphs Abs: 2.1 10*3/uL (ref 0.7–4.0)
MCH: 30.5 pg (ref 26.0–34.0)
MCHC: 33.5 g/dL (ref 30.0–36.0)
MCV: 91 fL (ref 80.0–100.0)
Monocytes Absolute: 0.6 10*3/uL (ref 0.1–1.0)
Monocytes Relative: 8 %
Neutro Abs: 4.8 10*3/uL (ref 1.7–7.7)
Neutrophils Relative %: 62 %
Platelets: 227 10*3/uL (ref 150–400)
RBC: 5.64 MIL/uL (ref 4.22–5.81)
RDW: 12.6 % (ref 11.5–15.5)
WBC: 7.8 10*3/uL (ref 4.0–10.5)
nRBC: 0 % (ref 0.0–0.2)

## 2020-09-28 LAB — COMPREHENSIVE METABOLIC PANEL
ALT: 35 U/L (ref 0–44)
AST: 30 U/L (ref 15–41)
Albumin: 4.2 g/dL (ref 3.5–5.0)
Alkaline Phosphatase: 68 U/L (ref 38–126)
Anion gap: 6 (ref 5–15)
BUN: 10 mg/dL (ref 6–20)
CO2: 24 mmol/L (ref 22–32)
Calcium: 9.1 mg/dL (ref 8.9–10.3)
Chloride: 104 mmol/L (ref 98–111)
Creatinine, Ser: 0.88 mg/dL (ref 0.61–1.24)
GFR, Estimated: >60 mL/min (ref >60)
Glucose, Bld: 108 mg/dL — ABNORMAL HIGH (ref 70–99)
Potassium: 4.2 mmol/L (ref 3.5–5.1)
Sodium: 134 mmol/L — ABNORMAL LOW (ref 135–145)
Total Bilirubin: 0.8 mg/dL (ref 0.3–1.2)
Total Protein: 8 g/dL (ref 6.5–8.1)

## 2020-09-28 LAB — PROTIME-INR
INR: 1 (ref 0.8–1.2)
Prothrombin Time: 13.4 seconds (ref 11.4–15.2)

## 2020-09-28 NOTE — Progress Notes (Signed)
Referring Provider: Ladell Pier, MD Primary Care Physician:  Ladell Pier, MD Primary GI: Dr. Abbey Chatters   Chief Complaint  Patient presents with   Cirrhosis    F/u. Doing okay    HPI:   Danny Arnold is a 45 y.o. male presenting today with a history of cirrhosis secondary to ETOH, varices with last EGD Feb 2021 with Grade 1 varices, moderate gastritis and duodenitis, surveillance due in 2024. No ETOH since 2014. [place] due now. Last in Feb 2022. Liver lesion on prior imaging with MRI in 2019 noting hemangioma. Due for routine labs now.   Hep A antibody reactive in 2016. Not immune to Hep B. He is unsure if he has received this vaccination. No abdominal pain, N/V. No overt GI bleeding. GERD controlled on omeprazole once daily. No jaundice or pruritis. No mental status changes or confusion. Good appetite. No GI concerns today.   Past Medical History:  Diagnosis Date   Alcoholic hepatitis    Alcoholic liver disease ([place])    Anemia    Ascites 02/03/2013   Bacteremia     Past Surgical History:  Procedure Laterality Date   COLONOSCOPY N/A 11/25/2012   [facility]   ESOPHAGEAL BANDING N/A 04/08/2019   Procedure: ESOPHAGEAL BANDING;  Surgeon: Danie Binder, MD;  [facility];  Service: Endoscopy;  Laterality: N/A;   ESOPHAGOGASTRODUODENOSCOPY N/A 11/25/2012   GRADE 1 VARICES   ESOPHAGOGASTRODUODENOSCOPY N/A 04/20/2016   Procedure: ESOPHAGOGASTRODUODENOSCOPY (EGD);  Surgeon: Danie Binder, MD;  Location: [facility];  Service: Endoscopy;  Laterality: N/A;  8:30am   ESOPHAGOGASTRODUODENOSCOPY (EGD) WITH PROPOFOL N/A 04/08/2019   Grade 1 varices, moderate gastritis and duodenitis, surveillance due in 2024.   HAND SURGERY Left    removal of foreign body and tendon repair.   HEMORRHOID BANDING  123456 x1   UMBILICAL HERNIA REPAIR N/A 08/30/2015   Procedure: UMBILICAL HERNIORRHAPHY WITH MESH;  Surgeon: Aviva Signs, MD;  Location: [facility];  Service:  General;  Laterality: N/A;    Current Outpatient Medications  Medication Sig Dispense Refill   omeprazole (PRILOSEC) 20 MG capsule 1 PO 30 MINS PRIOR TO BREAKFAST. 90 capsule 3   No current facility-administered medications for this visit.    Allergies as of 09/28/2020   (No Known Allergies)    Family History  Problem Relation Age of Onset   Migraines Mother    Colon cancer Neg Hx     Social History   Socioeconomic History   Marital status: Single    Spouse name: Not on file   Number of children: Not on file   Years of education: Not on file   Highest education level: Not on file  Occupational History   Not on file  Tobacco Use   Smoking status: Former    Packs/day: 0.25    Years: 15.00    Pack years: 3.75    Types: Cigarettes    Quit date: 04/03/2012    Years since quitting: 8.4   Smokeless tobacco: Never   Tobacco comments:    Quit smoking more than a year ago  Vaping Use   Vaping Use: Never used  Substance and Sexual Activity   Alcohol use: No    Alcohol/week: 0.0 standard drinks    Comment: As of 09/24/19: heavy etoh until 02/2012- none since 2014.   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   Social Determinants of Health   Financial Resource Strain: Not on file  Food Insecurity: Not on file  Transportation Needs: Not on file  Physical Activity: Not on file  Stress: Not on file  Social Connections: Not on file    Review of Systems: Gen: Denies fever, chills, anorexia. Denies fatigue, weakness, weight loss.  CV: Denies chest pain, palpitations, syncope, peripheral edema, and claudication. Resp: Denies dyspnea at rest, cough, wheezing, coughing up blood, and pleurisy. GI: see HPI Derm: Denies rash, itching, dry skin Psych: Denies depression, anxiety, memory loss, confusion. No homicidal or suicidal ideation.  Heme: Denies bruising,  bleeding, and enlarged lymph nodes.  Physical Exam: BP 121/84   Pulse 64   Temp 97.7 F (36.5 C)   Ht '5\' 8"'$  (1.727 m)   Wt 176 lb (79.8 kg)   BMI 26.76 kg/m  General:   Alert and oriented. No distress noted. Pleasant and cooperative.  Head:  Normocephalic and atraumatic. Eyes:  Conjuctiva clear without scleral icterus. Mouth:  mask in place Abdomen:  +BS, soft, non-tender and non-distended. No rebound or guarding. No HSM or masses noted. Msk:  Symmetrical without gross deformities. Normal posture. Extremities:  Without edema. Neurologic:  Alert and  oriented x4 Psych:  Alert and cooperative. Normal mood and affect.  ASSESSMENT: Danny Arnold is a 45 y.o. male presenting today with a history of cirrhosis secondary to ETOH, varices on EGD in 2021 with surveillance due in 2024, chronic GERD, for routine follow-up.  He remains well-compensated. No concerning signs/symptoms. Applauded on continued avoidance of ETOH since 2014. Due for routine labs and [place] now. I will also check Hep B immune status. Immune to Hep A.   Interpreter present for visit.   PLAN:  Continue omeprazole once daily CBC, CMP, INR, Hep B surface antibody Routine RUQ [place] EGD In 2024 Continue ETOH cessation and applauded on this 6 month follow-up  Annitta Needs, PhD, ANP-BC [facility]

## 2020-09-28 NOTE — Patient Instructions (Addendum)
   Hgase un anlisis de sangre y Marketing executive con los [place]!  Debe realizarse una ecografa de rutina, que organizaremos.  Contine con omeprazol una vez al da.  Nos vemos en 6 meses!  Fue un Oceanographer. Arelia Sneddon crear relaciones de confianza con los pacientes para brindar atencin [place], [place] y de calidad. Valoro tus comentarios. Si recibe [facility] su visita, le agradezco mucho que se tome el tiempo para completarla.  Dra. Annitta Needs, ANP-BC [facility] [place]   Please have blood work done, and we will call with results!  You are due for a routine ultrasound, which we will arrange.  Continue omeprazole once daily.  We will see you in 6 months!  It was a pleasure to see you today. I want to create trusting relationships with patients to provide genuine, compassionate, and quality care. I value your feedback. If you receive a survey regarding your visit,  I greatly appreciate you taking time to fill this out.   Annitta Needs, PhD, ANP-BC [facility]enter[facility]

## 2020-09-29 LAB — HEPATITIS B SURFACE ANTIBODY, QUANTITATIVE: Hep B S AB Quant (Post): 108.8 m[IU]/mL (ref >9.9)

## 2020-10-07 ENCOUNTER — Ambulatory Visit (HOSPITAL_COMMUNITY)
Admission: RE | Admit: 2020-10-07 | Discharge: 2020-10-07 | Disposition: A | Discharge status: Home / Self Care | Payer: Self-pay | Source: Ambulatory Visit | Attending: Gastroenterology | Admitting: Gastroenterology

## 2020-10-07 ENCOUNTER — Other Ambulatory Visit: Payer: Self-pay

## 2020-10-07 DIAGNOSIS — K7030 Alcoholic cirrhosis of liver without ascites: Secondary | ICD-10-CM | POA: Insufficient documentation

## 2020-10-11 ENCOUNTER — Other Ambulatory Visit: Payer: Self-pay | Admitting: Gastroenterology

## 2020-10-11 DIAGNOSIS — D582 Other hemoglobinopathies: Secondary | ICD-10-CM

## 2020-10-11 DIAGNOSIS — K7030 Alcoholic cirrhosis of liver without ascites: Secondary | ICD-10-CM

## 2020-10-14 ENCOUNTER — Other Ambulatory Visit: Payer: Self-pay

## 2020-10-14 ENCOUNTER — Other Ambulatory Visit (HOSPITAL_COMMUNITY)
Admission: RE | Admit: 2020-10-14 | Discharge: 2020-10-14 | Disposition: A | Discharge status: Home / Self Care | Payer: Self-pay | Source: Ambulatory Visit | Attending: Gastroenterology | Admitting: Gastroenterology

## 2020-10-14 DIAGNOSIS — K7030 Alcoholic cirrhosis of liver without ascites: Secondary | ICD-10-CM | POA: Insufficient documentation

## 2020-10-14 DIAGNOSIS — D582 Other hemoglobinopathies: Secondary | ICD-10-CM | POA: Insufficient documentation

## 2020-10-14 LAB — IRON AND TIBC
Iron: 115 ug/dL (ref 45–182)
Saturation Ratios: 38 % (ref 17.9–39.5)
TIBC: 305 ug/dL (ref 250–450)
UIBC: 190 ug/dL

## 2020-10-14 LAB — FERRITIN: Ferritin: 237 ng/mL (ref 24–336)

## 2020-10-21 ENCOUNTER — Other Ambulatory Visit: Payer: Self-pay

## 2020-10-21 DIAGNOSIS — I8510 Secondary esophageal varices without bleeding: Secondary | ICD-10-CM

## 2020-10-21 DIAGNOSIS — K7030 Alcoholic cirrhosis of liver without ascites: Secondary | ICD-10-CM

## 2020-10-26 ENCOUNTER — Telehealth: Payer: Self-pay | Admitting: Internal Medicine

## 2020-10-26 NOTE — Telephone Encounter (Signed)
Copied from [place] [id number]. Topic: General - Other >> Oct 26, 2020  4:10 PM Celene Kras wrote: Reason for CRM: Pt called and is requesting to speak with Danny Arnold regarding his paperwork for financial aid. He states that he would like to make sure that he is not missing any paperwork that he needs in order to receive assistance. Please advise.

## 2020-10-26 NOTE — Telephone Encounter (Signed)
I return Pt call, Pt was inform that we still waiting form [facility] to review his CAFA application

## 2020-12-21 NOTE — Progress Notes (Signed)
I, Danny Arnold, LAT, ATC, am serving as scribe for Dr. Lynne Leader.  Danny Arnold is a 45 y.o. male who presents to [place] at [facility] today for f/u of B elbow/arm pain, R>L.  He was last seen by Dr. Georgina Snell on 09/21/20 and noted 50% improvement in his symptoms after completing a course of PT/OT.  He was advised to con't w/ his HEP per PT/OT and to f/u in 3 months. Radial tunnel injection would be considered if his symptoms persist.  Today, pt reports that he con't to have B elbow and forearm pain, R >L.  He states that his L hand is better but notes that his R elbow and forearm are worse.  He is now having numbness in R fingers 2-4 that is constant.  He reports con't weakness in his B hands, R >L.  He is no longer attending PT/OT but was provided a HEP that is he doing intermittently.  Diagnostic imaging: B UE NCV/EMG- 07/13/20  Pertinent review of systems: No fevers or chills  Relevant historical information: Controlled cirrhosis   Exam:  BP 120/80 (BP Location: Right Arm, Patient Position: Sitting, Cuff Size: Normal)   Pulse 79   Ht 5\' 8"  (1.727 m)   Wt 172 lb 3.2 oz (78.1 kg)   SpO2 98%   BMI 26.18 kg/m  General: Well Developed, well nourished, and in no acute distress.   MSK: Right forearm Normal-appearing Tender palpation overlying radial tunnel area.  Wrist extension strength is intact. Pulses cap refill and sensation are intact distally.    Lab and Radiology Results  Procedure: Real-time Ultrasound Guided hydrodissection of dorsal interosseous nerve at arcade de frosche right forearm Device: Philips Affiniti 50G Images permanently stored and available for review in PACS Verbal informed consent obtained.  Discussed risks and benefits of procedure. Warned about infection bleeding damage to structures, temporary weakness to finger and hand extension, skin hypopigmentation and fat atrophy among others. Patient expresses understanding and  agreement Time-out conducted.   Noted no overlying erythema, induration, or other signs of local infection.   Skin prepped in a sterile fashion.   Local anesthesia: Topical Ethyl chloride.   With sterile technique and under real time ultrasound guidance: 40 mg of Kenalog and 2 mL of lidocaine injected into right forearm at radial tunnel area as above. Fluid seen entering the soft tissues surrounding dorsal interosseous nerve.   Completed without difficulty   Pain immediately resolved suggesting accurate placement of the medication.   Patient had weakness to finger and hand extension after the injection also indicating accurate placement of the medication. Advised to call if fevers/chills, erythema, induration, drainage, or persistent bleeding.   Images permanently stored and available for review in the ultrasound unit.  Impression: Technically successful ultrasound guided injection.         Assessment and Plan: 45 y.o. 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  He likely did have some lateral epicondylitis initially which did improve with PT however he continued to have ongoing pain resistant to conservative management. Today patient had a diagnostic and hopefully therapeutic injection at the right forearm radial tunnel area. He had immediate benefit from the injection and pain and did have a pretty good block of the dorsal interosseous nerve resulted in some temporary weakness of the finger extensors indicating that his pain is due to radial tunnel syndrome. Hopefully he will have good benefit from the steroid component. Recheck in 1 month.  Visit conducted using a Spanish interpreter.   PDMP not reviewed this encounter. Orders Placed This Encounter  Procedures   [place] LIMITED JOINT SPACE STRUCTURES UP RIGHT(NO LINKED CHARGES)    Order Specific Question:   Reason for Exam (SYMPTOM  OR DIAGNOSIS REQUIRED)    Answer:   R elbow pain    Order Specific  Question:   Preferred imaging location?    Answer:   [place]   No orders of the defined types were placed in this encounter.    Discussed warning signs or symptoms. Please see discharge instructions. Patient expresses understanding.   The above documentation has been reviewed and is accurate and complete Lynne Leader, M.D.

## 2020-12-22 ENCOUNTER — Other Ambulatory Visit: Payer: Self-pay

## 2020-12-22 ENCOUNTER — Ambulatory Visit: Payer: Self-pay

## 2020-12-22 ENCOUNTER — Ambulatory Visit (INDEPENDENT_AMBULATORY_CARE_PROVIDER_SITE_OTHER): Payer: Self-pay | Admitting: Family Medicine

## 2020-12-22 ENCOUNTER — Encounter: Payer: Self-pay | Admitting: Family Medicine

## 2020-12-22 VITALS — BP 120/80 | HR 79 | Ht 68.0 in | Wt 172.2 lb

## 2020-12-22 DIAGNOSIS — G5631 Lesion of radial nerve, right upper limb: Secondary | ICD-10-CM

## 2020-12-22 NOTE — Patient Instructions (Addendum)
Nice to see  you today.  You had a R radial tunnel injection.  Call or go to the ER if you develop a large red swollen joint with extreme pain or oozing puss.    Follow-up: One month for a 30 min appt w/ a Spanish interpreter

## 2021-01-18 NOTE — Progress Notes (Signed)
   I, Danny Arnold, LAT, ATC, am serving as scribe for Dr. Lynne Leader.  Danny Arnold is a 45 y.o. male who presents to [place] at [facility] today for f/u of B elbow/arm pain, R>L.  He was last seen by Dr. Georgina Snell on 12/22/20 and noted improvement in his L hand but worsening in his R elbow and forearm w/ paresthesias in fingers 2-4.  He had a R dorsal interosseous nerve / radial tunnel injection.  Today, pt reports that the pain in his elbows and forearms has improved/resolved but is still having cramping in his B forearms.  He also reports that the paresthesias in his R hand have also subsided.  Diagnostic imaging: B UE NCV/EMG- 07/13/20  Pertinent review of systems: No new fevers or chills  Relevant historical information: History of alcoholic cirrhosis.   Exam:  BP 130/84 (BP Location: Right Arm, Patient Position: Sitting, Cuff Size: Normal)   Pulse 83   Ht 5\' 8"  (1.727 m)   Wt 175 lb 9.6 oz (79.7 kg)   SpO2 96%   BMI 26.70 kg/m  General: Well Developed, well nourished, and in no acute distress.   MSK: Right arm normal-appearing nontender. Left arm normal-appearing nontender. Grip strength intact bilaterally.     Assessment and Plan: 45 y.o. male with radial tunnel syndrome bilaterally and lateral epicondylitis bilateral.  Significantly improved with conservative management, hand therapy, and right radial tunnel injection.  Recommend continued home exercise program and massage.  Recheck back as needed. Precautions reviewed   Discussed warning signs or symptoms. Please see discharge instructions. Patient expresses understanding.   The above documentation has been reviewed and is accurate and complete Lynne Leader, M.D.  Total encounter time 20 minutes including face-to-face time with the patient and, reviewing past medical record, and charting on the date of service.

## 2021-01-19 ENCOUNTER — Ambulatory Visit (INDEPENDENT_AMBULATORY_CARE_PROVIDER_SITE_OTHER): Payer: Self-pay | Admitting: Family Medicine

## 2021-01-19 ENCOUNTER — Encounter: Payer: Self-pay | Admitting: Family Medicine

## 2021-01-19 ENCOUNTER — Other Ambulatory Visit: Payer: Self-pay

## 2021-01-19 VITALS — BP 130/84 | HR 83 | Ht 68.0 in | Wt 175.6 lb

## 2021-01-19 DIAGNOSIS — G5631 Lesion of radial nerve, right upper limb: Secondary | ICD-10-CM

## 2021-01-19 DIAGNOSIS — M79601 Pain in right arm: Secondary | ICD-10-CM

## 2021-01-19 DIAGNOSIS — M79602 Pain in left arm: Secondary | ICD-10-CM

## 2021-01-19 NOTE — Patient Instructions (Addendum)
Good to see you today.  Con't your home exercises.  Follow-up as needed.

## 2021-03-31 ENCOUNTER — Ambulatory Visit (INDEPENDENT_AMBULATORY_CARE_PROVIDER_SITE_OTHER): Payer: Self-pay | Admitting: Gastroenterology

## 2021-03-31 ENCOUNTER — Other Ambulatory Visit (HOSPITAL_COMMUNITY)
Admission: RE | Admit: 2021-03-31 | Discharge: 2021-03-31 | Disposition: A | Discharge status: Home / Self Care | Payer: Self-pay | Source: Ambulatory Visit | Attending: Gastroenterology | Admitting: Gastroenterology

## 2021-03-31 ENCOUNTER — Encounter: Payer: Self-pay | Admitting: Gastroenterology

## 2021-03-31 ENCOUNTER — Other Ambulatory Visit: Payer: Self-pay

## 2021-03-31 VITALS — BP 124/85 | HR 70 | Temp 97.7°F | Ht 68.0 in | Wt 173.8 lb

## 2021-03-31 DIAGNOSIS — K7030 Alcoholic cirrhosis of liver without ascites: Secondary | ICD-10-CM | POA: Insufficient documentation

## 2021-03-31 LAB — CBC WITH DIFFERENTIAL/PLATELET
Abs Immature Granulocytes: 0.03 10*3/uL (ref 0.00–0.07)
Basophils Absolute: 0 10*3/uL (ref 0.0–0.1)
Basophils Relative: 0 %
Eosinophils Absolute: 0.1 10*3/uL (ref 0.0–0.5)
Eosinophils Relative: 1 %
HCT: 48.5 % (ref 39.0–52.0)
Hemoglobin: 17 g/dL (ref 13.0–17.0)
Immature Granulocytes: 0 %
Lymphocytes Relative: 25 %
Lymphs Abs: 1.7 10*3/uL (ref 0.7–4.0)
MCH: 31 pg (ref 26.0–34.0)
MCHC: 35.1 g/dL (ref 30.0–36.0)
MCV: 88.5 fL (ref 80.0–100.0)
Monocytes Absolute: 0.6 10*3/uL (ref 0.1–1.0)
Monocytes Relative: 9 %
Neutro Abs: 4.5 10*3/uL (ref 1.7–7.7)
Neutrophils Relative %: 65 %
Platelets: 248 10*3/uL (ref 150–400)
RBC: 5.48 MIL/uL (ref 4.22–5.81)
RDW: 12.4 % (ref 11.5–15.5)
WBC: 6.9 10*3/uL (ref 4.0–10.5)
nRBC: 0 % (ref 0.0–0.2)

## 2021-03-31 LAB — COMPREHENSIVE METABOLIC PANEL
ALT: 30 U/L (ref 0–44)
AST: 23 U/L (ref 15–41)
Albumin: 4.1 g/dL (ref 3.5–5.0)
Alkaline Phosphatase: 65 U/L (ref 38–126)
Anion gap: 11 (ref 5–15)
BUN: 11 mg/dL (ref 6–20)
CO2: 22 mmol/L (ref 22–32)
Calcium: 9.1 mg/dL (ref 8.9–10.3)
Chloride: 104 mmol/L (ref 98–111)
Creatinine, Ser: 0.89 mg/dL (ref 0.61–1.24)
GFR, Estimated: >60 mL/min (ref >60)
Glucose, Bld: 110 mg/dL — ABNORMAL HIGH (ref 70–99)
Potassium: 3.7 mmol/L (ref 3.5–5.1)
Sodium: 137 mmol/L (ref 135–145)
Total Bilirubin: 0.8 mg/dL (ref 0.3–1.2)
Total Protein: 7.5 g/dL (ref 6.5–8.1)

## 2021-03-31 NOTE — Patient Instructions (Signed)
Por favor, hgase un anlisis de [place].  Tambin estamos ordenando un ultrasonido en un futuro cercano.  Nos vemos en 6 meses!  Disfrut verte de nuevo hoy! Como saben, valoro [place] relacin y quiero brindar atencin genuina, compasiva y de calidad. Doy la bienvenida a sus comentarios. Si recibe [facility] su visita, le agradezco mucho que se tome el tiempo para completarla. Hasta la prxima!  Dra. Annitta Needs, ANP-BC [facility] [place]      Please have blood work done.  We are also ordering an ultrasound in the near future.  We will see you in 6 months!  I enjoyed seeing you again today! As you know, I value our relationship and want to provide genuine, compassionate, and quality care. I welcome your feedback. If you receive a survey regarding your visit,  I greatly appreciate you taking time to fill this out. See you next time!  Annitta Needs, PhD, ANP-BC [facility] Gastroenterology

## 2021-03-31 NOTE — Progress Notes (Signed)
Referring Provider: Ladell Pier, MD Primary Care Physician:  Ladell Pier, MD Primary GI: Dr. Abbey Chatters  Chief Complaint  Patient presents with   Cirrhosis    Doing ok    HPI:   Danny Arnold is a 46 y.o. 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 No ETOH since 2014. [place] due now. Last in August 2022. Liver lesion on prior imaging with MRI in 2019 noting hemangioma. Due for routine labs now. Immune to Hep A and B.   Denies mental status changes, confusion, jaundice, pruritis, abdominal pain, N/V, weight loss, dysphagia, GERD, constipation, diarrhea, changes in bowel habits, overt GI bleeding. Continues to abstain from alcohol. No concerns today.   Past Medical History:  Diagnosis Date   Alcoholic hepatitis    Alcoholic liver disease ([place])    Anemia    Ascites 02/03/2013   Bacteremia     Past Surgical History:  Procedure Laterality Date   COLONOSCOPY N/A 11/25/2012   [facility]   ESOPHAGEAL BANDING N/A 04/08/2019   Procedure: ESOPHAGEAL BANDING;  Surgeon: Danie Binder, MD;  [facility];  Service: Endoscopy;  Laterality: N/A;   ESOPHAGOGASTRODUODENOSCOPY N/A 11/25/2012   GRADE 1 VARICES   ESOPHAGOGASTRODUODENOSCOPY N/A 04/20/2016   Procedure: ESOPHAGOGASTRODUODENOSCOPY (EGD);  Surgeon: Danie Binder, MD;  [facility];  Service: Endoscopy;  Laterality: N/A;  8:30am   ESOPHAGOGASTRODUODENOSCOPY (EGD) WITH PROPOFOL N/A 04/08/2019   Grade 1 varices, moderate gastritis and duodenitis, surveillance due in 2024.   HAND SURGERY Left    removal of foreign body and tendon repair.   HEMORRHOID BANDING  3295 x1   UMBILICAL HERNIA REPAIR N/A 08/30/2015   Procedure: UMBILICAL HERNIORRHAPHY WITH MESH;  Surgeon: Aviva Signs, MD;  Location: [facility];  Service: General;  Laterality: N/A;    Current  Outpatient Medications  Medication Sig Dispense Refill   omeprazole (PRILOSEC) 20 MG capsule 1 PO 30 MINS PRIOR TO BREAKFAST. 90 capsule 3   No current facility-administered medications for this visit.    Allergies as of 03/31/2021   (No Known Allergies)    Family History  Problem Relation Age of Onset   Migraines Mother    Colon cancer Neg Hx     Social History   Socioeconomic History   Marital status: Single    Spouse name: Not on file   Number of children: Not on file   Years of education: Not on file   Highest education level: Not on file  Occupational History   Not on file  Tobacco Use   Smoking status: Former    Packs/day: 0.25    Years: 15.00    Pack years: 3.75    Types: Cigarettes    Quit date: 04/03/2012    Years since quitting: 8.9   Smokeless tobacco: Never   Tobacco comments:    Quit smoking more than a year ago  Vaping Use   Vaping Use: Never used  Substance and Sexual Activity   Alcohol use: No    Comment: As of 03/31/21: heavy etoh until 02/2012- none since 2014.   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   Social Determinants of Health   Financial Resource Strain: Not  on file  Food Insecurity: Not on file  Transportation Needs: Not on file  Physical Activity: Not on file  Stress: Not on file  Social Connections: Not on file    Review of Systems: Gen: Denies fever, chills, anorexia. Denies fatigue, weakness, weight loss.  CV: Denies chest pain, palpitations, syncope, peripheral edema, and claudication. Resp: Denies dyspnea at rest, cough, wheezing, coughing up blood, and pleurisy. GI: see HPI Derm: Denies rash, itching, dry skin Psych: Denies depression, anxiety, memory loss, confusion. No homicidal or suicidal ideation.  Heme: Denies bruising, bleeding, and enlarged lymph nodes.  Physical Exam: BP 124/85    Pulse 70    Temp  97.7 F (36.5 C) (Temporal)    Ht 5\' 8"  (1.727 m)    Wt 173 lb 12.8 oz (78.8 kg)    BMI 26.43 kg/m  General:   Alert and oriented. No distress noted. Pleasant and cooperative.  Head:  Normocephalic and atraumatic. Eyes:  Conjuctiva clear without scleral icterus. Mouth:  mask in place Abdomen:  +BS, soft, non-tender and non-distended. No rebound or guarding. No HSM or masses noted. Msk:  Symmetrical without gross deformities. Normal posture. Extremities:  Without edema. Neurologic:  Alert and  oriented x4 Psych:  Alert and cooperative. Normal mood and affect.  ASSESSMENT/PLAN: Danny Arnold is a 46 y.o. male presenting today with history of well-compensated cirrhosis due to history of ETOH. Small varices on prior EGD, with surveillance due in 2024. 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   He has no concerns today. Will update [place] of abdomen and labs.  Return in 6 months.  EGD in 2024.   Annitta Needs, PhD, ANP-BC [facility]

## 2021-04-08 ENCOUNTER — Other Ambulatory Visit: Payer: Self-pay

## 2021-04-08 ENCOUNTER — Ambulatory Visit (HOSPITAL_COMMUNITY)
Admission: RE | Admit: 2021-04-08 | Discharge: 2021-04-08 | Disposition: A | Discharge status: Home / Self Care | Payer: Self-pay | Source: Ambulatory Visit | Attending: Gastroenterology | Admitting: Gastroenterology

## 2021-04-08 DIAGNOSIS — K7030 Alcoholic cirrhosis of liver without ascites: Secondary | ICD-10-CM | POA: Insufficient documentation

## 2021-04-13 ENCOUNTER — Ambulatory Visit: Payer: Self-pay | Attending: Physician Assistant | Admitting: Physician Assistant

## 2021-04-13 ENCOUNTER — Other Ambulatory Visit: Payer: Self-pay

## 2021-04-13 ENCOUNTER — Encounter: Payer: Self-pay | Admitting: Physician Assistant

## 2021-04-13 VITALS — BP 141/91 | HR 75 | Wt 177.0 lb

## 2021-04-13 DIAGNOSIS — K7030 Alcoholic cirrhosis of liver without ascites: Secondary | ICD-10-CM

## 2021-04-13 DIAGNOSIS — R030 Elevated blood-pressure reading, without diagnosis of hypertension: Secondary | ICD-10-CM

## 2021-04-13 DIAGNOSIS — G5690 Unspecified mononeuropathy of unspecified upper limb: Secondary | ICD-10-CM

## 2021-04-13 NOTE — Progress Notes (Signed)
Patient ID: Danny Arnold, male   DOB: March 29, 1975, 46 y.o.   MRN: [id number] ? ?  ? ?Dakhari Arnold, is a 46 y.o. male ? ?ZES:[id number] ? ?[id number] ? ?DOB - 09-Sep-1975 ? ?Chief Complaint  ?Patient presents with  ? Hand Pain  ?    ? ?Subjective:  ? ?Danny Arnold is a 46 y.o. male here today with continued hand cramping and paresthesias.  He has been seen by sports medicine and had injections which were helpful for a few months but the problem has returned.   ? ?Has cirrhosis that is stable.  Sober almost 10 years now ? ?From GI note 03/31/2021.  BP 124/85 on that date ?HPI:   ?Danny Arnold is a 46 y.o. 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 No ETOH since 2014. [place] due now. Last in August 2022. Liver lesion on prior imaging with MRI in 2019 noting hemangioma. Due for routine labs now. Immune to Hep A and B.  ?  ?Denies mental status changes, confusion, jaundice, pruritis, abdominal pain, N/V, weight loss, dysphagia, GERD, constipation, diarrhea, changes in bowel habits, overt GI bleeding. Continues to abstain from alcohol. No concerns today.  ?  ? ?ASSESSMENT/PLAN: ?Danny Arnold is a 46 y.o. male presenting today with history of well-compensated cirrhosis due to history of ETOH. Small varices on prior EGD, with surveillance due in 2024. 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  ?  ?He has no concerns today. Will update [place] of abdomen and labs. ?  ?Return in 6 months. ? ?EGD in 2024.  ?No problems updated. ? ?ALLERGIES: ?No Known Allergies ? ?PAST MEDICAL HISTORY: ?Past Medical History:  ?Diagnosis Date  ? Alcoholic hepatitis   ? Alcoholic liver disease ([place])   ? Anemia   ? Ascites 02/03/2013  ? Bacteremia   ? ? ?MEDICATIONS AT HOME: ?Prior to Admission medications   ?Medication Sig Start Date  End Date Taking? Authorizing Provider  ?omeprazole (PRILOSEC) 20 MG capsule 1 PO 30 MINS PRIOR TO BREAKFAST. 08/05/19  Yes Ladell Pier, MD  ? ? ?ROS: ?Neg HEENT ?Neg resp ?Neg cardiac ?Neg GI ?Neg GU ?Neg psych ?Neg neuro ? ?Objective:  ? ?Vitals:  ? 04/13/21 0953  ?BP: (!) 141/91  ?Pulse: 75  ?SpO2: 97%  ?Weight: 177 lb (80.3 kg)  ? ?Exam ?General appearance : Awake, alert, not in any distress. Speech Clear. Not toxic looking ?HEENT: Atraumatic and Normocephalic ?Neck: Supple, no JVD. No cervical lymphadenopathy.  ?Chest: Good air entry bilaterally, CTAB.  No rales/rhonchi/wheezing ?CVS: S1 S2 regular, no murmurs.  ?Extremities: B/L Lower Ext shows no edema, both legs are warm to touch ?Neurology: Awake alert, and oriented X 3, CN II-XII intact, Non focal ?Skin: No Rash ? ?Data Review ?Lab Results  ?Component Value Date  ? HGBA1C 4.9 11/25/2012  ? ? ?Assessment & Plan  ? ?1. Neuropathy of hand, unspecified laterality ?Will help facilitate getting appt scheduled and make appt for OC application after march 8(per patient request) ?- Ambulatory referral to Sports Medicine ? ?2. Elevated blood pressure reading ?BP 124/85 at GI appt.  Watch and record OOO.   ?We have discussed target BP range and blood pressure goal. I have advised patient to check BP regularly and to call us back or report to clinic if the numbers are consistently higher than 140/90. We discussed the importance of  compliance with medical therapy and DASH diet recommended, consequences of uncontrolled hypertension discussed.  ? ? ?3. Alcoholic cirrhosis of liver without ascites ([place]) ?Sober almost 10 years.  Followed by GI.  Most recent labs unremarkable ? ? ? ?Patient have been counseled extensively about nutrition and exercise. Other issues discussed during this visit include: low cholesterol diet, weight control and daily exercise, foot care, annual eye examinations at Ophthalmology, importance of adherence with medications and regular  follow-up. We also discussed long term complications of uncontrolled diabetes and hypertension.  ? ?Return if symptoms worsen or fail to improve, for please schedule appt with financial after April 20, 2021. ? ?The patient was given clear instructions to go to ER or return to medical center if symptoms don't improve, worsen or new problems develop. The patient verbalized understanding. The patient was told to call to get lab results if they haven't heard anything in the next week.  ? ? ? ? ?Freeman Caldron, PA-C ?[facility] ?[place], [place] ?[phone]   ?04/13/2021, 10:02 AM  ?

## 2021-04-18 ENCOUNTER — Telehealth: Payer: Self-pay | Admitting: Internal Medicine

## 2021-04-18 NOTE — Telephone Encounter (Signed)
Recall for ultrasound 

## 2021-04-19 NOTE — Telephone Encounter (Signed)
Patient already had US done last month. Not due for 6 months ?

## 2021-04-22 ENCOUNTER — Other Ambulatory Visit (HOSPITAL_COMMUNITY)
Admission: RE | Admit: 2021-04-22 | Discharge: 2021-04-22 | Disposition: A | Discharge status: Home / Self Care | Payer: Self-pay | Source: Ambulatory Visit | Attending: Gastroenterology | Admitting: Gastroenterology

## 2021-04-22 ENCOUNTER — Other Ambulatory Visit: Payer: Self-pay

## 2021-04-22 ENCOUNTER — Ambulatory Visit: Payer: Self-pay | Admitting: Sports Medicine

## 2021-04-22 ENCOUNTER — Ambulatory Visit: Payer: Self-pay | Attending: Internal Medicine

## 2021-04-22 DIAGNOSIS — K7030 Alcoholic cirrhosis of liver without ascites: Secondary | ICD-10-CM | POA: Insufficient documentation

## 2021-04-22 LAB — CBC WITH DIFFERENTIAL/PLATELET
Abs Immature Granulocytes: 0.03 10*3/uL (ref 0.00–0.07)
Basophils Absolute: 0 10*3/uL (ref 0.0–0.1)
Basophils Relative: 0 %
Eosinophils Absolute: 0.1 10*3/uL (ref 0.0–0.5)
Eosinophils Relative: 2 %
HCT: 51 % (ref 39.0–52.0)
Hemoglobin: 16.8 g/dL (ref 13.0–17.0)
Immature Granulocytes: 0 %
Lymphocytes Relative: 21 %
Lymphs Abs: 1.5 10*3/uL (ref 0.7–4.0)
MCH: 29.2 pg (ref 26.0–34.0)
MCHC: 32.9 g/dL (ref 30.0–36.0)
MCV: 88.7 fL (ref 80.0–100.0)
Monocytes Absolute: 0.7 10*3/uL (ref 0.1–1.0)
Monocytes Relative: 9 %
Neutro Abs: 4.8 10*3/uL (ref 1.7–7.7)
Neutrophils Relative %: 68 %
Platelets: 233 10*3/uL (ref 150–400)
RBC: 5.75 MIL/uL (ref 4.22–5.81)
RDW: 12.4 % (ref 11.5–15.5)
WBC: 7.1 10*3/uL (ref 4.0–10.5)
nRBC: 0 % (ref 0.0–0.2)

## 2021-04-22 LAB — COMPREHENSIVE METABOLIC PANEL
ALT: 25 U/L (ref 0–44)
AST: 22 U/L (ref 15–41)
Albumin: 4.2 g/dL (ref 3.5–5.0)
Alkaline Phosphatase: 62 U/L (ref 38–126)
Anion gap: 9 (ref 5–15)
BUN: 14 mg/dL (ref 6–20)
CO2: 25 mmol/L (ref 22–32)
Calcium: 9.3 mg/dL (ref 8.9–10.3)
Chloride: 103 mmol/L (ref 98–111)
Creatinine, Ser: 0.97 mg/dL (ref 0.61–1.24)
GFR, Estimated: >60 mL/min (ref >60)
Glucose, Bld: 94 mg/dL (ref 70–99)
Potassium: 3.9 mmol/L (ref 3.5–5.1)
Sodium: 137 mmol/L (ref 135–145)
Total Bilirubin: 1.2 mg/dL (ref 0.3–1.2)
Total Protein: 7.9 g/dL (ref 6.5–8.1)

## 2021-04-22 LAB — PROTIME-INR
INR: 1.1 (ref 0.8–1.2)
Prothrombin Time: 13.9 seconds (ref 11.4–15.2)

## 2021-05-06 ENCOUNTER — Other Ambulatory Visit: Payer: Self-pay

## 2021-05-06 ENCOUNTER — Encounter: Payer: Self-pay | Admitting: Family Medicine

## 2021-05-06 ENCOUNTER — Ambulatory Visit (INDEPENDENT_AMBULATORY_CARE_PROVIDER_SITE_OTHER): Payer: Self-pay | Admitting: Family Medicine

## 2021-05-06 VITALS — BP 128/86 | Ht 68.0 in | Wt 167.0 lb

## 2021-05-06 DIAGNOSIS — M25539 Pain in unspecified wrist: Secondary | ICD-10-CM

## 2021-05-06 MED ORDER — GABAPENTIN 100 MG PO CAPS
100.0000 mg | ORAL_CAPSULE | Freq: Every day | ORAL | 0 refills | Status: DC
  Filled 2021-05-06: qty 90, 90d supply, fill #0

## 2021-05-06 NOTE — Progress Notes (Signed)
[facility]: Attending Note: ?I have reviewed the chart, discussed wit the Sports Medicine Fellow. I agree with assessment and treatment plan as detailed in the [place] note. ?Forearm pain bilaterally with certain activities.  It certainly seems like it is overuse syndrome although he is not convinced this is the problem as he says he does not do that hard work or at least not much of it.   ?On my exam I can reproduce his pain by deep palpation in the proximal wrist flexors. ? ?This could be some unusual bilateral peripheral neuropathy related to his history of heavy alcohol use but seems unlikely.  He does work as a Curator.  He is right-hand dominant.  He did receive some improvement after radial tunnel injection previously. ? ?We will continue the home exercise program he has previously been on.  I am not certain that he has radial tunnel syndrome.  Agree with plan as detailed in fellow's note with follow-up. ? ?Notably he is hesitant to try new medicine without checking with his liver specialist and I think that is a great idea.  I told him this was most likely fine but I agreed that he should call his hepatologist and clear this with him. ?

## 2021-05-06 NOTE — Progress Notes (Signed)
PCP: Ladell Pier, MD ? ?Subjective:  ? ?HPI: ?Danny Arnold is a 46 y.o. male here for evaluation of bilateral forearm pain. ? ?In person Spanish interpreter used throughout entirety of visit. ? ?Patient reports bilateral forearm pain and cramping for least 6 months, however I do see he saw his primary care physician for this issue on 04/12/2020 which would be over 1 year ago.  Patient states he has been seeing Dr. Georgina Snell and was diagnosed with radial tunnel syndrome.  He had the radial tunnel injected which helped improve the numbness and tingling in his fingers by about 75-80%, however it did not help with his pain and cramping in the forearms at all.  Independent chart review demonstrates that he did see neurology in May 2022 and did have an EMG study bilaterally which was normal without any evidence of neuropathy.  He has done a trial of physical therapy which she feels like helped his pain at first, however now he feels like it has worsened his symptoms.  He continues with home exercises for wrist extension exercises and wrist range of motion.  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 ? ?Patient does have a history of alcoholic cirrhosis and so is unable to take any NSAIDs or Tylenol per the patient.  He is not taking any medication for this. ? ?He reports cramping and pain in the dorsal forearms from just below the elbow to the wrist bilaterally, although his right is worse than the left.  He denies any weakness of the left arm but does state he has some weakness with the right hand with gripping and turning over the wrist.  He denies any redness, fever chills or signs of infection. ? ?NCV & EMG Findings (07/13/20): ?Extensive electrodiagnostic testing of the right upper extremity and additional studies of the left shows: ?Bilateral median, ulnar, and mixed palmar sensory responses are within normal limits. ?Bilateral  median and ulnar motor responses are within normal limits. ?There is no evidence of active or chronic motor axonal loss changes affecting any of the tested muscles.  Motor unit configuration and recruitment pattern is within normal limits. ?  ?Impression: ?This is a normal study of the upper extremities.  In particular, there is no evidence of carpal tunnel syndrome or a cervical radiculopathy. ? ?Past Medical History:  ?Diagnosis Date  ? Alcoholic hepatitis   ? Alcoholic liver disease ([place])   ? Anemia   ? Ascites 02/03/2013  ? Bacteremia   ? ? ?Current Outpatient Medications on File Prior to Visit  ?Medication Sig Dispense Refill  ? omeprazole (PRILOSEC) 20 MG capsule 1 PO 30 MINS PRIOR TO BREAKFAST. 90 capsule 3  ? ?No current facility-administered medications on file prior to visit.  ? ? ?Past Surgical History:  ?Procedure Laterality Date  ? COLONOSCOPY N/A 11/25/2012  ? IH  ? ESOPHAGEAL BANDING N/A 04/08/2019  ? Procedure: ESOPHAGEAL BANDING;  Surgeon: Danie Binder, MD;  [facility];  Service: Endoscopy;  Laterality: N/A;  ? ESOPHAGOGASTRODUODENOSCOPY N/A 11/25/2012  ? GRADE 1 VARICES  ? ESOPHAGOGASTRODUODENOSCOPY N/A 04/20/2016  ? Procedure: ESOPHAGOGASTRODUODENOSCOPY (EGD);  Surgeon: Danie Binder, MD;  [facility];  Service: Endoscopy;  Laterality: N/A;  8:30am  ? ESOPHAGOGASTRODUODENOSCOPY (EGD) WITH PROPOFOL N/A 04/08/2019  ? Grade 1 varices, moderate gastritis and duodenitis, surveillance due in 2024.  ? HAND SURGERY Left   ? removal  of foreign body and tendon repair.  ? HEMORRHOID BANDING  2014 x1  ? UMBILICAL HERNIA REPAIR N/A 08/30/2015  ? Procedure: UMBILICAL HERNIORRHAPHY WITH MESH;  Surgeon: Aviva Signs, MD;  Location: [facility];  Service: General;  Laterality: N/A;  ? ? ?No Known Allergies ? ?BP 128/86   Ht '5\' 8"'$  (1.727 m)   Wt 167 lb (75.8 kg)   BMI 25.39 kg/m?  ? ?   ? View : No data to display.  ?  ?  ?  ? ? ?   ? View : No data to display.  ?  ?  ?  ? ? ?     ?Objective:  ?Physical Exam: ? ?Gen: Well-appearing, in no acute distress; non-toxic ?CV: Regular Rate. Well-perfused. Warm.  ?Resp: Breathing unlabored on room air; no wheezing. ?Psych: Fluid speech in conversation; appropriate affect; normal thought process ?Neuro: Sensation intact throughout. No gross coordination deficits.  ?MSK:  ? ?- Right arm: Inspection is no erythema, ecchymosis or swelling.  He has full range of motion with flexion extension of the elbow.  No varus or valgus instability.  He does have pain with deep palpation about 3 cm distal to the lateral epicondyle.  Associated positive Tinel's over this area.  There is negative Tinel's at the carpal tunnel.  He does have reproduction of pain with resisted middle finger extension.  There is full strength at the level of the wrist.  There is both weakness and pain with resisted pronation.  There is some reproduction of pain with gripping although strength is preserved and equivocal.  He has full strength with finger abduction and adduction. ? ?- Left arm: Inspection demonstrates no erythema, ecchymosis or swelling.  He has full range of motion with flexion and extension of the elbow.  There is no varus or valgus instability of the elbow.  There is some pain to deep palpation about 3-4 cm distal to the lateral epicondyles.  There is associated positive Tinel's test over this area, negative Tinel's at the carpal tunnel and cubital tunnel.  Strength is 5/5 at the level of the elbow and the wrist in all directions, there is some pain with resisted supination and pronation of the wrist, although no weakness associated.  Ok sign intact.  Full strength with finger abduction and adduction. ? ? ?Assessment & Plan:  ?1. Bilateral forearm and elbow pain ?2. Hx of previously diagnosed radial tunnel syndrome, suspected ? ?Patient presents with greater than 1 year of bilateral dorsal forearm pain and numbness.  He was previously diagnosed with radial tunnel syndrome  and did have some improvement of numbness in his fingers with the injection, however this did not help his pain.  His pain continues to be bothersome every day, it will worsen with activity at times.  Pain is described as a cramping-like nature.  He did have EMG in May 2022 which was read as normal without any evidence of neuropathy or radiculopathy.  He has to my knowledge not had aging of the cervical spine, although likely would not be that helpful given his normal EMG and bilateral symptoms.  The patient does have a positive Tinel's over the radial tunnel area of bilateral forearm/elbows.  He does have some weakness however with supination of the right, which would not be indicative of radial tunnel syndrome as this is likely only pain.  This could be a component of radial tunnel +/- PIN syndrome, or this could be more of a peripheral neuropathy from his  previous alcohol use and alcohol cirrhosis.  Ideally I would like to proceed with MRI of the right upper extremity to rule out a anconeus epitrochlear areas or extra accessory muscles in that area or any soft tissue injury that could be impinging upon the nerve, however the patient does not have insurance that would cover an MRI.  We will try to treat this conservatively by continuing his exercises and will start him on gabapentin 100 mg nightly that he may titrate up to 300 mg nightly over the next 3 weeks.  We will see him back in about 1 month to see if he is having any improvement.  If he is not having improvement, we may have a discussion on further test such as MRI or even repeat EMG with his ongoing symptoms.  Ultimately, if he does not receive any relief the next option would be sending him to orthopedics. ? ?Elba Barman, DO ?PGY-4, Sports Medicine Fellow ?[place] ? ?This note was dictated using Dragon naturally speaking software and may contain errors in syntax, spelling, or content which have not been identified prior to  signing this note.  ? ? ?

## 2021-05-20 ENCOUNTER — Telehealth: Payer: Self-pay | Admitting: Internal Medicine

## 2021-05-20 NOTE — Telephone Encounter (Signed)
Pt is calling because he had an appt for financial assistance with [employer] 22 days ago. Pt would like to know if this information has been processed. ?Please advise CB- [phone] ?

## 2021-06-02 ENCOUNTER — Ambulatory Visit (INDEPENDENT_AMBULATORY_CARE_PROVIDER_SITE_OTHER): Payer: Self-pay | Admitting: Sports Medicine

## 2021-06-02 VITALS — BP 152/100 | Ht 68.0 in | Wt 167.0 lb

## 2021-06-02 DIAGNOSIS — M25539 Pain in unspecified wrist: Secondary | ICD-10-CM

## 2021-06-02 NOTE — Progress Notes (Signed)
PCP: Ladell Pier, MD ? ?Subjective:  ? ?HPI: ?Patient is a 46 y.o. male here for bilateral forearm pain and cramping. ? ?He was last seen on 05/06/21 at which time his symptoms were well documented. He started on a trial of gabapentin at that time. Today, he reports no change in his symptoms. He has cramping and pain in bilateral proximal forearms and both elbows that occurs constantly throughout the day. Worse on the right side. No particular activities make it worse. Doesn't wake him up at night. He denies and weakness, tingling, pain or numbness in his wrists, hands and fingers. His symptoms have been happening for over a year now and he is very concerned by them. Previous plan was to get an MRI however he has issues with insurance. Patient reports that he now has insurance through [employer].  ? ?Past Medical History:  ?Diagnosis Date  ? Alcoholic hepatitis   ? Alcoholic liver disease ([place])   ? Anemia   ? Ascites 02/03/2013  ? Bacteremia   ? ? ?Current Outpatient Medications on File Prior to Visit  ?Medication Sig Dispense Refill  ? gabapentin (NEURONTIN) 100 MG capsule Take 1 capsule (100 mg total) by mouth at bedtime. Take for 1 week. Then increase to 2 tabs at bedtime (200 mg) for 1 week. Then increase to 3 tabs (300 mg) at bedtime. 90 capsule 0  ? omeprazole (PRILOSEC) 20 MG capsule 1 PO 30 MINS PRIOR TO BREAKFAST. 90 capsule 3  ? ?No current facility-administered medications on file prior to visit.  ? ? ?Past Surgical History:  ?Procedure Laterality Date  ? COLONOSCOPY N/A 11/25/2012  ? IH  ? ESOPHAGEAL BANDING N/A 04/08/2019  ? Procedure: ESOPHAGEAL BANDING;  Surgeon: Danie Binder, MD;  [facility];  Service: Endoscopy;  Laterality: N/A;  ? ESOPHAGOGASTRODUODENOSCOPY N/A 11/25/2012  ? GRADE 1 VARICES  ? ESOPHAGOGASTRODUODENOSCOPY N/A 04/20/2016  ? Procedure: ESOPHAGOGASTRODUODENOSCOPY (EGD);  Surgeon: Danie Binder, MD;  [facility];  Service: Endoscopy;  Laterality: N/A;   8:30am  ? ESOPHAGOGASTRODUODENOSCOPY (EGD) WITH PROPOFOL N/A 04/08/2019  ? Grade 1 varices, moderate gastritis and duodenitis, surveillance due in 2024.  ? HAND SURGERY Left   ? removal of foreign body and tendon repair.  ? HEMORRHOID BANDING  2014 x1  ? UMBILICAL HERNIA REPAIR N/A 08/30/2015  ? Procedure: UMBILICAL HERNIORRHAPHY WITH MESH;  Surgeon: Aviva Signs, MD;  Location: [facility];  Service: General;  Laterality: N/A;  ? ? ?No Known Allergies ? ?BP (!) 152/100   Ht '5\' 8"'$  (1.727 m)   Wt 167 lb (75.8 kg)   BMI 25.39 kg/m?  ? ?   ? View : No data to display.  ?  ?  ?  ? ? ?   ? View : No data to display.  ?  ?  ?  ? ? ?    ?Objective:  ?Physical Exam: ? ?Gen: NAD, comfortable in exam room ?CV: Regular rate, well perfused ?Resp: No increased work of breathing, coughing or wheezing ?Psych: Normal mood and affect.  ?MSK: ? ?Left elbow: No erythema, ecchymoses or edema. Full ROM in flexion and extension of the elbow. Does report lateral proximal forearm cramping with full passive elbow extension. No varus or valgus instability. Pain with palpation of the distal triceps tendon 2 cm proximal to the elbow as well as pain over proximal 4 inches of the lateral and medial forearm up to both epicondyles. Positive Tinel's test over the lateral proximal  forearm, negative Tinel's at the carpal tunnel and cubital tunnel. 5/5 strength at the elbow and wrist in flexion and extension. Strength test limited by pain in resisted forearm pronation and supination. Grip strength intact. 2+ radial pulse. Neurovascularly intact distally. ? ?Right elbow: No erythema, ecchymoses or edema. Full ROM in flexion and extension of the elbow. No varus or valgus instability. Pain with palpation of the distal triceps tendon 2 cm proximal to the elbow as well as pain over proximal 4 inches of the lateral and medial forearm up to both epicondyles. Positive Tinel's test over the lateral proximal forearm, negative Tinel's at the carpal tunnel and  cubital tunnel. 5/5 strength at the elbow and wrist in flexion and extension. Strength test limited by pain in resisted forearm pronation and supination, more severe pain than on the left. Pain followed by cramping in the proximal forearm. Grip strength intact. 2+ radial pulse. Neurovascularly intact distally. ? ?  ?Assessment & Plan:  ?1. Bilateral proximal forearm pain and cramping - Patient presents with greater than 1 year of bilateral dorsal forearm pain and cramping, right worse than left. He did have EMG in May 2022 which was read as normal without any evidence of neuropathy or radiculopathy. Unfortunately he has not improved on gabapentin over the last month. His symptomatology does not fit any one musculoskeletal diagnosis. There could be a vascular etiology, however, he does not have symptoms distally making this unlikely. His normal EMG makes a neuropathy less likely. After further discussion with the patient, we made a shared decision to proceed with an MRI of the right elbow to better characterize his pathology. Patient will be informed of results via MyChart and we will schedule follow up accordingly.  ? ? ? ?Danny Arnold ?MS4, [employer] of Medicine ? ?Patient seen and evaluated with the medical student.  I agree with the above plan of care.  Etiology of this patient's symptoms is not straightforward.  We will order an MRI of his more symptomatic elbow and we will follow-up with him either via telephone or through [place] with those results when available. ?

## 2021-06-06 ENCOUNTER — Other Ambulatory Visit: Payer: Self-pay

## 2021-06-06 DIAGNOSIS — M25539 Pain in unspecified wrist: Secondary | ICD-10-CM

## 2021-06-06 NOTE — Progress Notes (Signed)
Pt wants MRI of left elbow as well. Order placed. ?

## 2021-07-01 ENCOUNTER — Ambulatory Visit
Admission: RE | Admit: 2021-07-01 | Discharge: 2021-07-01 | Disposition: A | Discharge status: Home / Self Care | Payer: Self-pay | Source: Ambulatory Visit | Attending: Sports Medicine | Admitting: Sports Medicine

## 2021-07-01 DIAGNOSIS — M25539 Pain in unspecified wrist: Secondary | ICD-10-CM

## 2021-07-05 ENCOUNTER — Other Ambulatory Visit: Payer: Self-pay

## 2021-07-05 DIAGNOSIS — M79601 Pain in right arm: Secondary | ICD-10-CM

## 2021-07-05 DIAGNOSIS — M25539 Pain in unspecified wrist: Secondary | ICD-10-CM

## 2021-07-06 ENCOUNTER — Telehealth: Payer: Self-pay | Admitting: Sports Medicine

## 2021-07-06 NOTE — Telephone Encounter (Signed)
  Patient was notified via MyChart of his MRI results of both elbows.  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  I am not sure whether or not this is responsible for all of his symptoms but I do think he would benefit from consultation with Dr. Tempie Donning.  I will defer further work-up and treatment to his discretion.

## 2021-07-07 ENCOUNTER — Encounter: Payer: Self-pay | Admitting: Orthopedic Surgery

## 2021-07-07 ENCOUNTER — Ambulatory Visit (INDEPENDENT_AMBULATORY_CARE_PROVIDER_SITE_OTHER): Payer: Self-pay | Admitting: Orthopedic Surgery

## 2021-07-07 ENCOUNTER — Other Ambulatory Visit: Payer: Self-pay

## 2021-07-07 DIAGNOSIS — M19022 Primary osteoarthritis, left elbow: Secondary | ICD-10-CM

## 2021-07-07 DIAGNOSIS — M19021 Primary osteoarthritis, right elbow: Secondary | ICD-10-CM

## 2021-07-07 DIAGNOSIS — M7711 Lateral epicondylitis, right elbow: Secondary | ICD-10-CM

## 2021-07-07 DIAGNOSIS — M7712 Lateral epicondylitis, left elbow: Secondary | ICD-10-CM

## 2021-07-07 MED ORDER — MELOXICAM 7.5 MG PO TABS
7.5000 mg | ORAL_TABLET | Freq: Every day | ORAL | 0 refills | Status: AC
  Filled 2021-07-07: qty 30, 30d supply, fill #0

## 2021-07-07 NOTE — Progress Notes (Signed)
Office Visit Note   Patient: Danny Arnold           Date of Birth: 1975-12-12           MRN: [id number] Visit Date: 07/07/2021              Requested by: Thurman Coyer, DO [address],  [place] [address] PCP: Ladell Pier, MD   Assessment & Plan: Visit Diagnoses:  1. Lateral epicondylitis of both elbows   2. Arthritis of elbow, left   3. Arthritis of elbow, right     Plan: Discussed with patient that he seems to have multiple issues going on with both elbows.  He describes pain over the lateral epicondyles that seem to radiate distally into the proximal forearm which seems consistent with lateral epicondylitis.  He has a home exercise program which he is still working on. He recently underwent MRI of both elbows which demonstrate full thickness radiocapitellar and partial thickness ulnohumeral cartilage loss.  He has taken gabapentin with minimal symptom relief.  We can try a short course of oral meloxicam to see if this helps.  If not, we can try a corticosteroid injection into bilateral elbow joints.  I can see him back in a month or so if he remains symptomatic.   Follow-Up Instructions: No follow-ups on file.   Orders:  No orders of the defined types were placed in this encounter.  Meds ordered this encounter  Medications   meloxicam (MOBIC) 7.5 MG tablet    Sig: Take 1 tablet (7.5 mg total) by mouth daily.    Dispense:  30 tablet    Refill:  0      Procedures: No procedures performed   Clinical Data: No additional findings.   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  He describes pain over bilateral later epicondyles that radiate into the proximal forearm.  He describes both sharp and cramping type pain.  The pain is progressively worse over  the last year or so since it was first noticed.  He also describes pain at the medial aspect of the elbow in the area of the ulnohumeral joint.  He denies any numbness or paresthesias.  He notes that his elbow and forearm pain seems to be worse with activity and involve pulling or lifting.  He gives the example lifting a bucket of paint or lifting a hammer.  He is taking gabapentin which has not helped much.  He is done formal physical therapy and has been working through a home exercise program.  He notes that his symptoms improve immediately following the activity but then seem to return.  Per chart review, he had injection in the area of the radial tunnel which seems to have helped his symptoms some.  Per chart review it also seems like he had an EMG/nerve conduction study which was unremarkable.   Review of Systems   Objective: Vital Signs: There were no vitals taken for this visit.  Physical Exam Constitutional:      Appearance: Normal appearance.  Cardiovascular:     Rate and Rhythm: Normal rate.     Pulses: Normal pulses.  Pulmonary:     Effort: Pulmonary effort is normal.  Skin:    General: Skin is warm and  dry.     Capillary Refill: Capillary refill takes less than 2 seconds.  Neurological:     Mental Status: He is alert.    Right Elbow Exam   Tenderness  Right elbow tenderness location: TTP over lateral epicondyle and just distal to LE.   Other  Erythema: absent Sensation: normal Pulse: present  Comments:  Full flexion but lacks ~10 deg of elbow extension.  Pain over lateral epicondyle and proximal forearm with resisted middle finger and wrist extension.  No weakness with wrist or finger extension.  Negative Tinel along radial tunnel.    Left Elbow Exam   Tenderness  Left elbow tenderness location: TTP over lateral epicondyle and just distal to LE.   Other  Erythema: absent Sensation: normal Pulse: present  Comments:  Full flexion but lacks ~10 deg of elbow  extension.  Pain over lateral epicondyle and proximal forearm with resisted middle finger and wrist extension.  No weakness with wrist or finger extension.  Negative Tinel along radial tunnel.      Specialty Comments:  No specialty comments available.  Imaging: No results found.   PMFS History: 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   Arthritis of elbow, right 07/07/2021   Radial tunnel syndrome, right 12/22/2020   Influenza vaccine needed 04/12/2020   Secondary esophageal varices without bleeding ([place]) 08/05/2019   Patellofemoral pain syndrome of both knees 08/05/2019   Lateral epicondylitis of both elbows 08/05/2019   Constipation 12/21/2017   Elevated blood pressure reading 12/04/2017   History of alcohol abuse 06/19/2017   Gastritis and gastroduodenitis    Umbilical hernia 85/27/7824   Esophageal varices in alcoholic cirrhosis ([place]) 23/53/6144   Alcoholic hepatitis 31/54/0086   Cirrhosis, alcoholic ([place]) 76/19/5093   Past Medical History:  Diagnosis Date   Alcoholic hepatitis    Alcoholic liver disease ([place])    Anemia    Ascites 02/03/2013   Bacteremia     Family History  Problem Relation Age of Onset   Migraines Mother    Colon cancer Neg Hx     Past Surgical History:  Procedure Laterality Date   COLONOSCOPY N/A 11/25/2012   [facility]   ESOPHAGEAL BANDING N/A 04/08/2019   Procedure: ESOPHAGEAL BANDING;  Surgeon: Danie Binder, MD;  [facility];  Service: Endoscopy;  Laterality: N/A;   ESOPHAGOGASTRODUODENOSCOPY N/A 11/25/2012   GRADE 1 VARICES   ESOPHAGOGASTRODUODENOSCOPY N/A 04/20/2016   Procedure: ESOPHAGOGASTRODUODENOSCOPY (EGD);  Surgeon: Danie Binder, MD;  [facility];  Service: Endoscopy;  Laterality: N/A;  8:30am   ESOPHAGOGASTRODUODENOSCOPY (EGD) WITH PROPOFOL N/A 04/08/2019   Grade 1 varices, moderate gastritis and duodenitis, surveillance due in 2024.   HAND SURGERY Left     removal of foreign body and tendon repair.   HEMORRHOID BANDING  2671 x1   UMBILICAL HERNIA REPAIR N/A 08/30/2015   Procedure: UMBILICAL HERNIORRHAPHY WITH MESH;  Surgeon: Aviva Signs, MD;  Location: [facility];  Service: General;  Laterality: N/A;   Social History   Occupational History   Not on file  Tobacco Use   Smoking status: Former    Packs/day: 0.25    Years: 15.00    Pack years: 3.75    Types: Cigarettes    Quit date: 04/03/2012    Years since quitting: 9.2   Smokeless tobacco: Never   Tobacco comments:    Quit smoking more than a year ago  Vaping Use   Vaping Use:  Never used  Substance and Sexual Activity   Alcohol use: No    Comment: As of 03/31/21: heavy etoh until 02/2012- none since 2014.   Drug use: No   Sexual activity: Yes

## 2021-08-08 ENCOUNTER — Other Ambulatory Visit: Payer: Self-pay | Admitting: Family Medicine

## 2021-08-09 MED ORDER — GABAPENTIN 100 MG PO CAPS
100.0000 mg | ORAL_CAPSULE | Freq: Every day | ORAL | 0 refills | Status: DC
  Filled 2021-08-09: qty 90, 30d supply, fill #0

## 2021-08-10 ENCOUNTER — Other Ambulatory Visit: Payer: Self-pay

## 2021-08-30 ENCOUNTER — Telehealth: Payer: Self-pay | Admitting: Internal Medicine

## 2021-08-30 NOTE — Telephone Encounter (Signed)
Recall for ultrasound 

## 2021-08-30 NOTE — Telephone Encounter (Signed)
Recall sent 

## 2021-08-31 ENCOUNTER — Telehealth: Payer: Self-pay | Admitting: Internal Medicine

## 2021-08-31 DIAGNOSIS — K7030 Alcoholic cirrhosis of liver without ascites: Secondary | ICD-10-CM

## 2021-08-31 NOTE — Addendum Note (Signed)
Addended by: Cheron Every on: 08/31/2021 02:29 PM   Modules accepted: Orders

## 2021-08-31 NOTE — Telephone Encounter (Signed)
Mychart message sent with appt details

## 2021-08-31 NOTE — Telephone Encounter (Signed)
Patient called to schedule his ultrasound

## 2021-09-21 ENCOUNTER — Encounter: Payer: Self-pay | Admitting: *Deleted

## 2021-09-21 ENCOUNTER — Other Ambulatory Visit: Payer: Self-pay | Admitting: *Deleted

## 2021-09-21 ENCOUNTER — Ambulatory Visit (HOSPITAL_COMMUNITY)
Admission: RE | Admit: 2021-09-21 | Discharge: 2021-09-21 | Disposition: A | Discharge status: Home / Self Care | Payer: No Typology Code available for payment source | Source: Ambulatory Visit | Attending: Gastroenterology | Admitting: Gastroenterology

## 2021-09-21 ENCOUNTER — Telehealth: Payer: Self-pay

## 2021-09-21 DIAGNOSIS — R160 Hepatomegaly, not elsewhere classified: Secondary | ICD-10-CM

## 2021-09-21 DIAGNOSIS — K7030 Alcoholic cirrhosis of liver without ascites: Secondary | ICD-10-CM | POA: Insufficient documentation

## 2021-09-21 NOTE — Progress Notes (Signed)
Pt already scheduled and sent mychart message

## 2021-09-21 NOTE — Telephone Encounter (Signed)
noted 

## 2021-09-21 NOTE — Telephone Encounter (Signed)
Noted. I have made a result note.

## 2021-09-21 NOTE — Telephone Encounter (Signed)
[facility] making [place] aware of pt's scan.

## 2021-09-22 ENCOUNTER — Ambulatory Visit (HOSPITAL_COMMUNITY)
Admission: RE | Admit: 2021-09-22 | Discharge: 2021-09-22 | Disposition: A | Discharge status: Home / Self Care | Payer: No Typology Code available for payment source | Source: Ambulatory Visit | Attending: Gastroenterology | Admitting: Gastroenterology

## 2021-09-22 DIAGNOSIS — R160 Hepatomegaly, not elsewhere classified: Secondary | ICD-10-CM | POA: Insufficient documentation

## 2021-09-22 MED ORDER — GADOBUTROL 1 MMOL/ML IV SOLN
7.5000 mL | Freq: Once | INTRAVENOUS | Status: AC | PRN
  Administered 2021-09-22: 7 mL via INTRAVENOUS

## 2021-09-28 ENCOUNTER — Encounter: Payer: Self-pay | Admitting: Gastroenterology

## 2021-09-28 ENCOUNTER — Ambulatory Visit (INDEPENDENT_AMBULATORY_CARE_PROVIDER_SITE_OTHER): Payer: Self-pay | Admitting: Gastroenterology

## 2021-09-28 ENCOUNTER — Other Ambulatory Visit (HOSPITAL_COMMUNITY)
Admission: RE | Admit: 2021-09-28 | Discharge: 2021-09-28 | Disposition: A | Discharge status: Home / Self Care | Payer: Self-pay | Source: Ambulatory Visit | Attending: Gastroenterology | Admitting: Gastroenterology

## 2021-09-28 VITALS — BP 121/78 | HR 67 | Ht 68.0 in | Wt 176.9 lb

## 2021-09-28 DIAGNOSIS — K7030 Alcoholic cirrhosis of liver without ascites: Secondary | ICD-10-CM

## 2021-09-28 LAB — PROTIME-INR
INR: 1 (ref 0.8–1.2)
Prothrombin Time: 13.5 seconds (ref 11.4–15.2)

## 2021-09-28 LAB — COMPREHENSIVE METABOLIC PANEL
ALT: 25 U/L (ref 0–44)
AST: 21 U/L (ref 15–41)
Albumin: 3.9 g/dL (ref 3.5–5.0)
Alkaline Phosphatase: 67 U/L (ref 38–126)
Anion gap: 5 (ref 5–15)
BUN: 18 mg/dL (ref 6–20)
CO2: 24 mmol/L (ref 22–32)
Calcium: 8.9 mg/dL (ref 8.9–10.3)
Chloride: 109 mmol/L (ref 98–111)
Creatinine, Ser: 1.06 mg/dL (ref 0.61–1.24)
GFR, Estimated: >60 mL/min (ref >60)
Glucose, Bld: 116 mg/dL — ABNORMAL HIGH (ref 70–99)
Potassium: 3.8 mmol/L (ref 3.5–5.1)
Sodium: 138 mmol/L (ref 135–145)
Total Bilirubin: 0.7 mg/dL (ref 0.3–1.2)
Total Protein: 7.6 g/dL (ref 6.5–8.1)

## 2021-09-28 LAB — CBC WITH DIFFERENTIAL/PLATELET
Abs Immature Granulocytes: 0.03 10*3/uL (ref 0.00–0.07)
Basophils Absolute: 0 10*3/uL (ref 0.0–0.1)
Basophils Relative: 0 %
Eosinophils Absolute: 0.1 10*3/uL (ref 0.0–0.5)
Eosinophils Relative: 2 %
HCT: 48.1 % (ref 39.0–52.0)
Hemoglobin: 16.3 g/dL (ref 13.0–17.0)
Immature Granulocytes: 0 %
Lymphocytes Relative: 29 %
Lymphs Abs: 2 10*3/uL (ref 0.7–4.0)
MCH: 29.7 pg (ref 26.0–34.0)
MCHC: 33.9 g/dL (ref 30.0–36.0)
MCV: 87.8 fL (ref 80.0–100.0)
Monocytes Absolute: 0.7 10*3/uL (ref 0.1–1.0)
Monocytes Relative: 10 %
Neutro Abs: 4 10*3/uL (ref 1.7–7.7)
Neutrophils Relative %: 59 %
Platelets: 231 10*3/uL (ref 150–400)
RBC: 5.48 MIL/uL (ref 4.22–5.81)
RDW: 13 % (ref 11.5–15.5)
WBC: 6.9 10*3/uL (ref 4.0–10.5)
nRBC: 0 % (ref 0.0–0.2)

## 2021-09-28 NOTE — Progress Notes (Signed)
Gastroenterology Office Note     Primary Care Physician:  Ladell Pier, MD  Primary Gastroenterologist: Dr. Abbey Chatters   Chief Complaint   Chief Complaint  Patient presents with   Follow-up    Patient here today to follow up on Alcoholic cirrhosis of liver. Is currently doing well on Omeprazole 20 mg daily.Denies any current Gi issues.      History of Present Illness   Danny Arnold is a 46 y.o. 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 Immune to Hep A and B. US abdomen Aug 2023 with concern for liver mass; however, MRI completed and consistent with known hemangioma.    No abdominal pain, N/V, changes in bowel habits, constipation, diarrhea, overt GI bleeding, GERD, dysphagia, unexplained weight loss, lack of appetite, unexplained weight gain. No jaundice, mental status changes, confusion. He remains on omeprazole daily.   Interpreter present with him today.          Past Medical History:  Diagnosis Date   Alcoholic hepatitis    Alcoholic liver disease ([place])    Anemia    Ascites 02/03/2013   Bacteremia     Past Surgical History:  Procedure Laterality Date   COLONOSCOPY N/A 11/25/2012   [facility]   ESOPHAGEAL BANDING N/A 04/08/2019   Procedure: ESOPHAGEAL BANDING;  Surgeon: Danie Binder, MD;  [facility];  Service: Endoscopy;  Laterality: N/A;   ESOPHAGOGASTRODUODENOSCOPY N/A 11/25/2012   GRADE 1 VARICES   ESOPHAGOGASTRODUODENOSCOPY N/A 04/20/2016   Procedure: ESOPHAGOGASTRODUODENOSCOPY (EGD);  Surgeon: Danie Binder, MD;  [facility];  Service: Endoscopy;  Laterality: N/A;  8:30am   ESOPHAGOGASTRODUODENOSCOPY (EGD) WITH PROPOFOL N/A 04/08/2019   Grade 1 varices, moderate gastritis and duodenitis, surveillance due in 2024.   HAND SURGERY Left    removal of foreign body  and tendon repair.   HEMORRHOID BANDING  9233 x1   UMBILICAL HERNIA REPAIR N/A 08/30/2015   Procedure: UMBILICAL HERNIORRHAPHY WITH MESH;  Surgeon: Aviva Signs, MD;  Location: [facility];  Service: General;  Laterality: N/A;    Current Outpatient Medications  Medication Sig Dispense Refill   gabapentin (NEURONTIN) 100 MG capsule Take 1 capsule (100 mg total) by mouth at bedtime. Take for 1 week. Then increase to 2 tabs at bedtime (200 mg) for 1 week. Then increase to 3 tabs (300 mg) at bedtime. 90 capsule 0   omeprazole (PRILOSEC) 20 MG capsule 1 PO 30 MINS PRIOR TO BREAKFAST. 90 capsule 3   No current facility-administered medications for this visit.    Allergies as of 09/28/2021   (No Known Allergies)    Family History  Problem Relation Age of Onset   Migraines Mother    Colon cancer Neg Hx     Social History   Socioeconomic History   Marital status: Single    Spouse name: Not on file   Number of children: Not on file   Years of education: Not on file   Highest education level: Not on file  Occupational History   Not on file  Tobacco Use   Smoking status: Former    Packs/day: 0.25    Years: 15.00    Total pack years: 3.75    Types: Cigarettes    Quit date: 04/03/2012    Years since quitting: 9.4   Smokeless tobacco: Never   Tobacco comments:  Quit smoking more than a year ago  Vaping Use   Vaping Use: Never used  Substance and Sexual Activity   Alcohol use: No    Comment: As of 03/31/21: heavy etoh until 02/2012- none since 2014.   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   Social Determinants of Health   Financial Resource Strain: Not on file  Food Insecurity: Not on file  Transportation Needs: Not on file  Physical Activity: Not on file  Stress: Not on file  Social Connections: Not on file  Intimate Partner Violence: Not on file     Review  of Systems   Gen: Denies any fever, chills, fatigue, weight loss, lack of appetite.  CV: Denies chest pain, heart palpitations, peripheral edema, syncope.  Resp: Denies shortness of breath at rest or with exertion. Denies wheezing or cough.  GI: see HPI GU : Denies urinary burning, urinary frequency, urinary hesitancy MS: Denies joint pain, muscle weakness, cramps, or limitation of movement.  Derm: Denies rash, itching, dry skin Psych: Denies depression, anxiety, memory loss, and confusion Heme: Denies bruising, bleeding, and enlarged lymph nodes.   Physical Exam   BP 121/78 (BP Location: Left Arm, Patient Position: Sitting, Cuff Size: Large)   Pulse 67   Ht '5\' 8"'$  (1.727 m)   Wt 176 lb 14.4 oz (80.2 kg)   BMI 26.90 kg/m  General:   Alert and oriented. Pleasant and cooperative. Well-nourished and well-developed.  Head:  Normocephalic and atraumatic. Eyes:  Without icterus Abdomen:  +BS, soft, non-tender and non-distended. No HSM noted. No guarding or rebound. No masses appreciated.  Rectal:  Deferred  Msk:  Symmetrical without gross deformities. Normal posture. Extremities:  Without edema. Neurologic:  Alert and  oriented x4;  grossly normal neurologically. Skin:  Intact without significant lesions or rashes. Psych:  Alert and cooperative. Normal mood and affect.   Assessment   Danny Arnold is a 46 y.o. male presenting today in follow-up with a history of cirrhosis secondary to ETOH, remaining sober since 2014. Chronic GERD also noted.  He is well-compensated. Due for labs now. Recent MRI with hemangioma. [place] due again in Feb 2024. Will update labs today. Continues to do well.  GERD: continue on omeprazole daily.   PLAN    Continue PPI daily [place] in Feb 2024 Labs today EGD in 2024 Return in 6 months   Annitta Needs, PhD, [facility]

## 2021-09-28 NOTE — Patient Instructions (Signed)
Por favor, hgase un anlisis de [employer].  Le haremos una ecografa en febrero.  Nos vemos en 6 meses!  Disfrut verte de nuevo hoy! Como saben, valoro [place] relacin y quiero brindar atencin genuina, compasiva y de calidad. Doy la bienvenida a sus comentarios. Si recibe [facility] su visita, le agradezco mucho que se tome el tiempo para completarla. Hasta la prxima!  Dra. Annitta Needs, ANP-BC [facility] [place]      Please have blood work done today.  We will do an ultrasound in February.  We will see you in 6 months!  I enjoyed seeing you again today! As you know, I value our relationship and want to provide genuine, compassionate, and quality care. I welcome your feedback. If you receive a survey regarding your visit,  I greatly appreciate you taking time to fill this out. See you next time!  Annitta Needs, PhD, ANP-BC [facility] Gastroenterology

## 2021-09-30 LAB — AFP TUMOR MARKER: AFP, Serum, Tumor Marker: <1.8 ng/mL (ref 0.0–6.9)

## 2021-10-24 ENCOUNTER — Other Ambulatory Visit: Payer: Self-pay | Admitting: Internal Medicine

## 2021-10-26 ENCOUNTER — Other Ambulatory Visit: Payer: Self-pay

## 2021-11-23 ENCOUNTER — Ambulatory Visit: Payer: No Typology Code available for payment source | Admitting: Physician Assistant

## 2021-12-05 ENCOUNTER — Ambulatory Visit: Payer: Self-pay | Attending: Physician Assistant | Admitting: Internal Medicine

## 2021-12-05 ENCOUNTER — Other Ambulatory Visit: Payer: Self-pay

## 2021-12-05 ENCOUNTER — Encounter: Payer: Self-pay | Admitting: Internal Medicine

## 2021-12-05 VITALS — BP 133/90 | HR 71 | Ht 67.0 in | Wt 206.2 lb

## 2021-12-05 DIAGNOSIS — R252 Cramp and spasm: Secondary | ICD-10-CM

## 2021-12-05 DIAGNOSIS — Z23 Encounter for immunization: Secondary | ICD-10-CM

## 2021-12-05 MED ORDER — CYCLOBENZAPRINE HCL 5 MG PO TABS
5.0000 mg | ORAL_TABLET | ORAL | 1 refills | Status: DC
  Filled 2021-12-05: qty 15, 35d supply, fill #0

## 2021-12-05 MED ORDER — GABAPENTIN 100 MG PO CAPS
100.0000 mg | ORAL_CAPSULE | Freq: Every day | ORAL | 0 refills | Status: DC
  Filled 2021-12-05: qty 90, 30d supply, fill #0

## 2021-12-05 NOTE — Progress Notes (Signed)
Patient ID: Danny Arnold, male    DOB: July 24, 1975  MRN: [id number]  CC: Hand Problem (discomfort) and Medication Refill   Subjective: Danny Arnold is a 46 y.o. male who presents for hand pain His concerns today include:  Hx of ETOH cirrhosis, esophageal varices followed by GI  Pt c/o having cramps in hands for over 1 yr. Reports it is the same issue as last yr.  09/23/2021:  Of note pt had c/o weakness in hands last yr.  Exam was normal.  He was referred to neurology.  He saw Dr. Posey Pronto.  Her assessment was that he does not have a peripheral neuropathy but most likely tendinopathy of his hands.  EMG was normal.  He was referred to sports medicine.  He saw Dr. Georgina Snell.  His assessment was that the patient's symptoms corresponds with medial epicondylitis on the right and lateral epicondylitis on the left and mild distal biceps tendinitis bilaterally.  Patient referred to physical therapy. Completed P.T 2 days ago.  He notes significant improvement.   He was found to have Vit B12 in low nl range (383) after last visit with me.  I recommended taking OTC vit B12.  He took for several wks but felt it caused cramps in hands so he discontinued taking.  -today he request RF on Gabapentin 100 mg QHS.  Out x 1.5 mth.  Reports it takes some of the pain away but it does not help with the cramps.  When asked to show me the areas where he gets cramps, patient points to the lateral forearm from the lateral epicondyles to about the mid forearms   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   Arthritis of elbow, right 07/07/2021   Radial tunnel syndrome, right 12/22/2020   Influenza vaccine needed 04/12/2020   Secondary esophageal varices without bleeding ([place]) 08/05/2019   Patellofemoral pain syndrome of both knees 08/05/2019   Lateral epicondylitis of both elbows 08/05/2019   Constipation 12/21/2017   Elevated blood pressure  reading 12/04/2017   History of alcohol abuse 06/19/2017   Gastritis and gastroduodenitis    Umbilical hernia 81/19/1478   Esophageal varices in alcoholic cirrhosis ([place]) 29/56/2130   Alcoholic hepatitis 86/57/8469   Cirrhosis, alcoholic ([place]) 62/95/2841     Current Outpatient Medications on File Prior to Visit  Medication Sig Dispense Refill   omeprazole (PRILOSEC) 20 MG capsule 1 PO 30 MINS PRIOR TO BREAKFAST. 90 capsule 3   gabapentin (NEURONTIN) 100 MG capsule Take 1 capsule (100 mg total) by mouth at bedtime. Take for 1 week. Then increase to 2 tabs at bedtime (200 mg) for 1 week. Then increase to 3 tabs (300 mg) at bedtime. (Patient not taking: Reported on 12/05/2021) 90 capsule 0   No current facility-administered medications on file prior to visit.    No Known Allergies  Social History   Socioeconomic History   Marital status: Single    Spouse name: Not on file   Number of children: Not on file   Years of education: Not on file   Highest education level: Not on file  Occupational History   Not on file  Tobacco Use   Smoking status: Former    Packs/day: 0.25    Years: 15.00    Total pack years: 3.75    Types: Cigarettes    Quit date: 04/03/2012    Years since quitting: 9.6   Smokeless tobacco: Never   Tobacco comments:  Quit smoking more than a year ago  Vaping Use   Vaping Use: Never used  Substance and Sexual Activity   Alcohol use: No    Comment: As of 03/31/21: heavy etoh until 02/2012- none since 2014.   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   Social Determinants of Health   Financial Resource Strain: Not on file  Food Insecurity: Not on file  Transportation Needs: Not on file  Physical Activity: Not on file  Stress: Not on file  Social Connections: Not on file  Intimate Partner Violence: Not on file    Family History  Problem  Relation Age of Onset   Migraines Mother    Colon cancer Neg Hx     Past Surgical History:  Procedure Laterality Date   COLONOSCOPY N/A 11/25/2012   [facility]   ESOPHAGEAL BANDING N/A 04/08/2019   Procedure: ESOPHAGEAL BANDING;  Surgeon: Danie Binder, MD;  [facility];  Service: Endoscopy;  Laterality: N/A;   ESOPHAGOGASTRODUODENOSCOPY N/A 11/25/2012   GRADE 1 VARICES   ESOPHAGOGASTRODUODENOSCOPY N/A 04/20/2016   Procedure: ESOPHAGOGASTRODUODENOSCOPY (EGD);  Surgeon: Danie Binder, MD;  [facility];  Service: Endoscopy;  Laterality: N/A;  8:30am   ESOPHAGOGASTRODUODENOSCOPY (EGD) WITH PROPOFOL N/A 04/08/2019   Grade 1 varices, moderate gastritis and duodenitis, surveillance due in 2024.   HAND SURGERY Left    removal of foreign body and tendon repair.   HEMORRHOID BANDING  1610 x1   UMBILICAL HERNIA REPAIR N/A 08/30/2015   Procedure: UMBILICAL HERNIORRHAPHY WITH MESH;  Surgeon: Aviva Signs, MD;  Location: [facility];  Service: General;  Laterality: N/A;    ROS: Review of Systems Negative except as stated above  PHYSICAL EXAM: BP (!) 133/90   Pulse 71   Ht '5\' 7"'$  (1.702 m)   SpO2 95%   BMI 27.71 kg/m   Physical Exam  General appearance - alert, well appearing, and in no distress Mental status - normal mood, behavior, speech, dress, motor activity, and thought processes Musculoskeletal -grip in both hands 5/5 bilaterally.  No wasting of intrinsic muscles of the hands.  Power in upper extremities 5/5 bilaterally proximally and distally.  No muscle wasting seen.  No muscle fasciculations seen.      Latest Ref Rng & Units 09/28/2021   10:58 AM 04/22/2021    9:36 AM 03/31/2021   11:13 AM  CMP  Glucose 70 - 99 mg/dL 116  94  110   BUN 6 - 20 mg/dL '18  14  11   '$ Creatinine 0.61 - 1.24 mg/dL 1.06  0.97  0.89   Sodium 135 - 145 mmol/L 138  137  137   Potassium 3.5 - 5.1 mmol/L 3.8  3.9  3.7   Chloride 98 - 111 mmol/L 109  103  104   CO2 22 - 32 mmol/L '24  25   22   '$ Calcium 8.9 - 10.3 mg/dL 8.9  9.3  9.1   Total Protein 6.5 - 8.1 g/dL 7.6  7.9  7.5   Total Bilirubin 0.3 - 1.2 mg/dL 0.7  1.2  0.8   Alkaline Phos 38 - 126 U/L 67  62  65   AST 15 - 41 U/L '21  22  23   '$ ALT 0 - 44 U/L '25  25  30    '$ Lipid  Panel  No results found for: "CHOL", "TRIG", "HDL", "CHOLHDL", "VLDL", "LDLCALC", "LDLDIRECT"  CBC    Component Value Date/Time   WBC 6.9 09/28/2021 1058   RBC 5.48 09/28/2021 1058   HGB 16.3 09/28/2021 1058   HCT 48.1 09/28/2021 1058   PLT 231 09/28/2021 1058   MCV 87.8 09/28/2021 1058   MCH 29.7 09/28/2021 1058   MCHC 33.9 09/28/2021 1058   RDW 13.0 09/28/2021 1058   LYMPHSABS 2.0 09/28/2021 1058   MONOABS 0.7 09/28/2021 1058   EOSABS 0.1 09/28/2021 1058   BASOSABS 0.0 09/28/2021 1058    ASSESSMENT AND PLAN:  1. Cramp of extremity Refill given on gabapentin. We will try him with low-dose of Flexeril to take no more than 3 times a week given his history of cirrhosis.  Advised that Flexeril can cause drowsiness and not to take it when he has to drive or operate machinery.  Gabapentin (NEURONTIN) 100 MG capsule; Take 1 capsule (100 mg total) by mouth at bedtime. Take for 1 week. Then increase to 2 tabs at bedtime (200 mg) for 1 week. Then increase to 3 tabs (300 mg) at bedtime.  Dispense: 90 capsule; Refill: 0 - cyclobenzaprine (FLEXERIL) 5 MG tablet; 1 tab PO 3 times a wk PO PRN  Dispense: 15 tablet; Refill: 1  2. Need for influenza vaccination Given today.   [employer] Language interpreter used during this encounter. #[id number], Michelene Heady  Patient was given the opportunity to ask questions.  Patient verbalized understanding of the plan and was able to repeat key elements of the plan.   This documentation was completed using Radio producer.  Any transcriptional errors are unintentional.  No orders of the defined types were placed in this encounter.    Requested Prescriptions    No prescriptions requested or ordered in  this encounter    No follow-ups on file.  Karle Plumber, MD, FACP

## 2021-12-08 ENCOUNTER — Ambulatory Visit: Payer: Self-pay | Attending: Internal Medicine

## 2021-12-08 NOTE — Progress Notes (Signed)
Pt has new orange card in-hand e/e 12/08/21-06/09/22. Deleted enrollment for temp card which was sent via mail on 12/06/2021. Also issued blue card with same e/e. [employer]AP app submitted to [place].

## 2022-02-21 ENCOUNTER — Telehealth: Payer: Self-pay | Admitting: Internal Medicine

## 2022-02-21 NOTE — Telephone Encounter (Signed)
He is due for OV in Feb also. So please schedule and we can schedule at that [place]. Thanks!

## 2022-02-21 NOTE — Telephone Encounter (Signed)
3 yr EGD - recall for Feb.

## 2022-03-21 ENCOUNTER — Other Ambulatory Visit (HOSPITAL_COMMUNITY)
Admission: RE | Admit: 2022-03-21 | Discharge: 2022-03-21 | Disposition: A | Discharge status: Home / Self Care | Payer: Self-pay | Source: Ambulatory Visit | Attending: Gastroenterology | Admitting: Gastroenterology

## 2022-03-21 ENCOUNTER — Ambulatory Visit (INDEPENDENT_AMBULATORY_CARE_PROVIDER_SITE_OTHER): Payer: Self-pay | Admitting: Gastroenterology

## 2022-03-21 ENCOUNTER — Encounter: Payer: Self-pay | Admitting: Gastroenterology

## 2022-03-21 VITALS — BP 131/88 | HR 80 | Temp 98.1°F | Ht 67.0 in | Wt 180.4 lb

## 2022-03-21 DIAGNOSIS — K7030 Alcoholic cirrhosis of liver without ascites: Secondary | ICD-10-CM

## 2022-03-21 LAB — COMPREHENSIVE METABOLIC PANEL
ALT: 28 U/L (ref 0–44)
AST: 24 U/L (ref 15–41)
Albumin: 4 g/dL (ref 3.5–5.0)
Alkaline Phosphatase: 68 U/L (ref 38–126)
Anion gap: 8 (ref 5–15)
BUN: 13 mg/dL (ref 6–20)
CO2: 22 mmol/L (ref 22–32)
Calcium: 8.8 mg/dL — ABNORMAL LOW (ref 8.9–10.3)
Chloride: 105 mmol/L (ref 98–111)
Creatinine, Ser: 1 mg/dL (ref 0.61–1.24)
GFR, Estimated: >60 mL/min (ref >60)
Glucose, Bld: 106 mg/dL — ABNORMAL HIGH (ref 70–99)
Potassium: 3.7 mmol/L (ref 3.5–5.1)
Sodium: 135 mmol/L (ref 135–145)
Total Bilirubin: 0.8 mg/dL (ref 0.3–1.2)
Total Protein: 7.7 g/dL (ref 6.5–8.1)

## 2022-03-21 LAB — CBC WITH DIFFERENTIAL/PLATELET
Abs Immature Granulocytes: 0.03 10*3/uL (ref 0.00–0.07)
Basophils Absolute: 0 10*3/uL (ref 0.0–0.1)
Basophils Relative: 0 %
Eosinophils Absolute: 0.2 10*3/uL (ref 0.0–0.5)
Eosinophils Relative: 2 %
HCT: 51.5 % (ref 39.0–52.0)
Hemoglobin: 17.7 g/dL — ABNORMAL HIGH (ref 13.0–17.0)
Immature Granulocytes: 0 %
Lymphocytes Relative: 23 %
Lymphs Abs: 1.9 10*3/uL (ref 0.7–4.0)
MCH: 30.2 pg (ref 26.0–34.0)
MCHC: 34.4 g/dL (ref 30.0–36.0)
MCV: 87.7 fL (ref 80.0–100.0)
Monocytes Absolute: 0.7 10*3/uL (ref 0.1–1.0)
Monocytes Relative: 8 %
Neutro Abs: 5.6 10*3/uL (ref 1.7–7.7)
Neutrophils Relative %: 67 %
Platelets: 243 10*3/uL (ref 150–400)
RBC: 5.87 MIL/uL — ABNORMAL HIGH (ref 4.22–5.81)
RDW: 12.6 % (ref 11.5–15.5)
WBC: 8.3 10*3/uL (ref 4.0–10.5)
nRBC: 0 % (ref 0.0–0.2)

## 2022-03-21 LAB — PROTIME-INR
INR: 1 (ref 0.8–1.2)
Prothrombin Time: 13.3 seconds (ref 11.4–15.2)

## 2022-03-21 NOTE — H&P (View-Only) (Signed)
Gastroenterology Office Note     Primary Care Physician:  Ladell Pier, MD  Primary Gastroenterologist: Dr. Abbey Chatters    Chief Complaint   Chief Complaint  Patient presents with   Follow-up    Follow up cirrhosis, no other problems today     History of Present Illness   Danny Arnold is a 47 y.o. 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 Immune to Hep A and B. US abdomen Aug 2023 with concern for liver mass; however, MRI completed and consistent with known hemangioma.     He is due for routine [place]. EGD due now. Routine screening colonoscopy due as well this year.   No abdominal pain, N/V, changes in bowel habits, constipation, diarrhea, overt GI bleeding, uncontrolled GERD, dysphagia, unexplained weight loss, lack of appetite, unexplained weight gain. No jaundice or pruritus. No mental status changes or confusion.    Past Medical History:  Diagnosis Date   Alcoholic hepatitis    Alcoholic liver disease ([place])    Anemia    Ascites 02/03/2013   Bacteremia     Past Surgical History:  Procedure Laterality Date   COLONOSCOPY N/A 11/25/2012   [facility]   ESOPHAGEAL BANDING N/A 04/08/2019   Procedure: ESOPHAGEAL BANDING;  Surgeon: Danie Binder, MD;  [facility];  Service: Endoscopy;  Laterality: N/A;   ESOPHAGOGASTRODUODENOSCOPY N/A 11/25/2012   GRADE 1 VARICES   ESOPHAGOGASTRODUODENOSCOPY N/A 04/20/2016   Procedure: ESOPHAGOGASTRODUODENOSCOPY (EGD);  Surgeon: Danie Binder, MD;  [facility];  Service: Endoscopy;  Laterality: N/A;  8:30am   ESOPHAGOGASTRODUODENOSCOPY (EGD) WITH PROPOFOL N/A 04/08/2019   Grade 1 varices, moderate gastritis and duodenitis, surveillance due in 2024.   HAND SURGERY Left    removal of foreign body and tendon repair.   HEMORRHOID BANDING  123456 x1    UMBILICAL HERNIA REPAIR N/A 08/30/2015   Procedure: UMBILICAL HERNIORRHAPHY WITH MESH;  Surgeon: Aviva Signs, MD;  Location: [facility];  Service: General;  Laterality: N/A;    Current Outpatient Medications  Medication Sig Dispense Refill   omeprazole (PRILOSEC) 20 MG capsule 1 PO 30 MINS PRIOR TO BREAKFAST. 90 capsule 3   No current facility-administered medications for this visit.    Allergies as of 03/21/2022   (No Known Allergies)    Family History  Problem Relation Age of Onset   Migraines Mother    Colon cancer Neg Hx     Social History   Socioeconomic History   Marital status: Single    Spouse name: Not on file   Number of children: Not on file   Years of education: Not on file   Highest education level: Not on file  Occupational History   Not on file  Tobacco Use   Smoking status: Former    Packs/day: 0.25    Years: 15.00    Total pack years: 3.75    Types: Cigarettes    Quit date: 04/03/2012    Years since quitting: 9.9   Smokeless tobacco: Never   Tobacco comments:    Quit smoking more than a year ago  Vaping Use   Vaping Use: Never used  Substance and Sexual Activity   Alcohol use: No    Comment: As of 03/31/21: heavy etoh until 02/2012- none since 2014.   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   Social Determinants of Health   Financial Resource Strain: Not on file  Food Insecurity: Not on file  Transportation Needs: Not on file  Physical Activity: Not on file  Stress: Not on file  Social Connections: Not on file  Intimate Partner Violence: Not on file     Review of Systems   Gen: Denies any fever, chills, fatigue, weight loss, lack of appetite.  CV: Denies chest pain, heart palpitations, peripheral edema, syncope.  Resp: Denies shortness of breath at rest or with exertion. Denies wheezing or cough.  GI: Denies dysphagia or  odynophagia. Denies jaundice, hematemesis, fecal incontinence. GU : Denies urinary burning, urinary frequency, urinary hesitancy MS: Denies joint pain, muscle weakness, cramps, or limitation of movement.  Derm: Denies rash, itching, dry skin Psych: Denies depression, anxiety, memory loss, and confusion Heme: Denies bruising, bleeding, and enlarged lymph nodes.   Physical Exam   BP 131/88   Pulse 80   Temp 98.1 F (36.7 C)   Ht '5\' 7"'$  (1.702 m)   Wt 180 lb 6.4 oz (81.8 kg)   BMI 28.25 kg/m  General:   Alert and oriented. Pleasant and cooperative. Well-nourished and well-developed.  Head:  Normocephalic and atraumatic. Eyes:  Without icterus Cardiac: S1 S2 present without murmurs Lungs: clear bilaterally Abdomen:  +BS, soft, non-tender and non-distended. No HSM noted. No guarding or rebound. No masses appreciated.  Rectal:  Deferred  Msk:  Symmetrical without gross deformities. Normal posture. Extremities:  Without edema. Neurologic:  Alert and  oriented x4;  grossly normal neurologically. Skin:  Intact without significant lesions or rashes. Psych:  Alert and cooperative. Normal mood and affect.   Assessment   Danny Arnold is a 47 y.o. 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  Cirrhosis: last EGD Feb 2021 with Grade 1 varices and due for surveillance now. US abdomen due now and routine labs. He remains well-compensated and has abstained from alcohol for 10 years.   Routine screening colonoscopy: last in 2014. Will pursue colonoscopy at time of EGD. No concerning lower GI signs/symptoms.   The patient was found to have elevated blood pressure when vital signs were checked in the office. The blood pressure was rechecked by the nursing staff, and it was found to be persistently elevated >140/90 mmHg. I personally advised the patient to follow up closely with the PCP for hypertension control.  Interpreter present  for visit.   PLAN   Proceed with upper endoscopy/colonoscopy by Dr. Abbey Chatters in near future: the risks, benefits, and alternatives have been discussed with the patient in detail. The patient states understanding and desires to proceed.  RUQ [place] Labs 6 month return   Annitta Needs, PhD, [facility]

## 2022-03-21 NOTE — Progress Notes (Signed)
  Gastroenterology Office Note     Primary Care Physician:  Johnson, Deborah B, MD  Primary Gastroenterologist: Dr. Carver    Chief Complaint   Chief Complaint  Patient presents with   Follow-up    Follow up cirrhosis, no other problems today     History of Present Illness   Danny Arnold is a 47 y.o. 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 Immune to Hep A and B. US abdomen Aug 2023 with concern for liver mass; however, MRI completed and consistent with known hemangioma.     He is due for routine US. EGD due now. Routine screening colonoscopy due as well this year.   No abdominal pain, N/V, changes in bowel habits, constipation, diarrhea, overt GI bleeding, uncontrolled GERD, dysphagia, unexplained weight loss, lack of appetite, unexplained weight gain. No jaundice or pruritus. No mental status changes or confusion.    Past Medical History:  Diagnosis Date   Alcoholic hepatitis    Alcoholic liver disease ([facility])    Anemia    Ascites 02/03/2013   Bacteremia     Past Surgical History:  Procedure Laterality Date   COLONOSCOPY N/A 11/25/2012   [facility]   ESOPHAGEAL BANDING N/A 04/08/2019   Procedure: ESOPHAGEAL BANDING;  Surgeon: Fields, Sandi L, MD;  [facility];  Service: Endoscopy;  Laterality: N/A;   ESOPHAGOGASTRODUODENOSCOPY N/A 11/25/2012   GRADE 1 VARICES   ESOPHAGOGASTRODUODENOSCOPY N/A 04/20/2016   Procedure: ESOPHAGOGASTRODUODENOSCOPY (EGD);  Surgeon: Sandi L Fields, MD;  [facility];  Service: Endoscopy;  Laterality: N/A;  8:30am   ESOPHAGOGASTRODUODENOSCOPY (EGD) WITH PROPOFOL N/A 04/08/2019   Grade 1 varices, moderate gastritis and duodenitis, surveillance due in 2024.   HAND SURGERY Left    removal of foreign body and tendon repair.   HEMORRHOID BANDING  2014 x1    UMBILICAL HERNIA REPAIR N/A 08/30/2015   Procedure: UMBILICAL HERNIORRHAPHY WITH MESH;  Surgeon: Mark Jenkins, MD;  Location: [facility];  Service: General;  Laterality: N/A;    Current Outpatient Medications  Medication Sig Dispense Refill   omeprazole (PRILOSEC) 20 MG capsule 1 PO 30 MINS PRIOR TO BREAKFAST. 90 capsule 3   No current facility-administered medications for this visit.    Allergies as of 03/21/2022   (No Known Allergies)    Family History  Problem Relation Age of Onset   Migraines Mother    Colon cancer Neg Hx     Social History   Socioeconomic History   Marital status: Single    Spouse name: Not on file   Number of children: Not on file   Years of education: Not on file   Highest education level: Not on file  Occupational History   Not on file  Tobacco Use   Smoking status: Former    Packs/day: 0.25    Years: 15.00    Total pack years: 3.75    Types: Cigarettes    Quit date: 04/03/2012    Years since quitting: 9.9   Smokeless tobacco: Never   Tobacco comments:    Quit smoking more than a year ago  Vaping Use   Vaping Use: Never used  Substance and Sexual Activity   Alcohol use: No    Comment: As of 03/31/21: heavy etoh until 02/2012- none since 2014.   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   Social Determinants of Health   Financial Resource Strain: Not on file  Food Insecurity: Not on file  Transportation Needs: Not on file  Physical Activity: Not on file  Stress: Not on file  Social Connections: Not on file  Intimate Partner Violence: Not on file     Review of Systems   Gen: Denies any fever, chills, fatigue, weight loss, lack of appetite.  CV: Denies chest pain, heart palpitations, peripheral edema, syncope.  Resp: Denies shortness of breath at rest or with exertion. Denies wheezing or cough.  GI: Denies dysphagia or  odynophagia. Denies jaundice, hematemesis, fecal incontinence. GU : Denies urinary burning, urinary frequency, urinary hesitancy MS: Denies joint pain, muscle weakness, cramps, or limitation of movement.  Derm: Denies rash, itching, dry skin Psych: Denies depression, anxiety, memory loss, and confusion Heme: Denies bruising, bleeding, and enlarged lymph nodes.   Physical Exam   BP 131/88   Pulse 80   Temp 98.1 F (36.7 C)   Ht 5' 7" (1.702 m)   Wt 180 lb 6.4 oz (81.8 kg)   BMI 28.25 kg/m  General:   Alert and oriented. Pleasant and cooperative. Well-nourished and well-developed.  Head:  Normocephalic and atraumatic. Eyes:  Without icterus Cardiac: S1 S2 present without murmurs Lungs: clear bilaterally Abdomen:  +BS, soft, non-tender and non-distended. No HSM noted. No guarding or rebound. No masses appreciated.  Rectal:  Deferred  Msk:  Symmetrical without gross deformities. Normal posture. Extremities:  Without edema. Neurologic:  Alert and  oriented x4;  grossly normal neurologically. Skin:  Intact without significant lesions or rashes. Psych:  Alert and cooperative. Normal mood and affect.   Assessment   [name] is a 47 y.o. 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  Cirrhosis: last EGD Feb 2021 with Grade 1 varices and due for surveillance now. US abdomen due now and routine labs. He remains well-compensated and has abstained from alcohol for 10 years.   Routine screening colonoscopy: last in 2014. Will pursue colonoscopy at time of EGD. No concerning lower GI signs/symptoms.   The patient was found to have elevated blood pressure when vital signs were checked in the office. The blood pressure was rechecked by the nursing staff, and it was found to be persistently elevated >140/90 mmHg. I personally advised the patient to follow up closely with the PCP for hypertension control.  Interpreter present  for visit.   PLAN   Proceed with upper endoscopy/colonoscopy by Dr. Carver in near future: the risks, benefits, and alternatives have been discussed with the patient in detail. The patient states understanding and desires to proceed.  RUQ US Labs 6 month return   Arshiya Jakes W. Gracia Saggese, PhD, ANP-BC [facility]    

## 2022-03-21 NOTE — Patient Instructions (Addendum)
Hemos ordenado laboratorios para hoy.  Llamaremos y concertaremos una ecografa en el futuro.  Tambin llamaremos para concertar una colonoscopia y Ardelia Mems endoscopia superior!  Nos vemos en 6 meses!  Disfrut verte de nuevo hoy! En nuestra primera visita, mencion cmo valoro [place] relacin y deseo brindar una atencin [place], compasiva y de calidad. Es posible que reciba una encuesta sobre su visita conmigo y Scientist, clinical (histocompatibility and immunogenetics) sus comentarios. Muchas gracias por tomarse el tiempo para completar esto. Espero verte denuevo.  Annitta Needs, PhD, ANP-BC [facility]           We have ordered labs for today.   We will call and arrange an ultrasound in the future.  We will also call to arrange a colonoscopy and upper endoscopy!  We will see you in 6 months!  I enjoyed seeing you again today! At our first visit, I mentioned how I value our relationship and want to provide genuine, compassionate, and quality care. You may receive a survey regarding your visit with me, and I welcome your feedback! Thanks so much for taking the time to complete this. I look forward to seeing you again.   Annitta Needs, PhD, ANP-BC [facility]

## 2022-03-22 ENCOUNTER — Encounter: Payer: Self-pay | Admitting: *Deleted

## 2022-03-22 ENCOUNTER — Other Ambulatory Visit: Payer: Self-pay | Admitting: *Deleted

## 2022-03-22 DIAGNOSIS — K7030 Alcoholic cirrhosis of liver without ascites: Secondary | ICD-10-CM

## 2022-03-30 ENCOUNTER — Ambulatory Visit (HOSPITAL_COMMUNITY)
Admission: RE | Admit: 2022-03-30 | Discharge: 2022-03-30 | Disposition: A | Discharge status: Home / Self Care | Payer: Self-pay | Source: Ambulatory Visit | Attending: Gastroenterology | Admitting: Gastroenterology

## 2022-03-30 DIAGNOSIS — K7030 Alcoholic cirrhosis of liver without ascites: Secondary | ICD-10-CM | POA: Insufficient documentation

## 2022-04-05 NOTE — Patient Instructions (Signed)
Instrucciones Para Antes de la Ciruga   Su ciruga est programada para-(your procedure is scheduled on)  04/10/2022 at 0900 am   [facility] - (enter)    Por favor llame al [phone] si tiene algn problema en la maana de la ciruga. (please call if you have any problems the morning of surgery.)                  Recuerde: (Remember)      Follow diet and prep instructions from the office.      Tome estas medicinas en la maana de la ciruga con un SORBITO de agua (take these meds the morning of surgery with a SIP of water)                                   omeprazole.   Puede cepillarse los dientes en la maana de la [place]. (you may brush your teeth the morning of surgery)   No use joyas, maquillaje de ojos, lpiz labial, crema para el cuerpo o esmalte de uas oscuro. (Do not wear jewelry, eye makeup, lipstick, body lotion, or dark fingernail polish)   No puede usar desodorante. (you may wear deodorant)   Si va a ser ingresado despues de la ciruga, deje la maleta en el carro hasta que se le haya asignado una habitacin. (If you are to be admitted after surgery, leave suitcase in car until your room has been assigned.)   A los pacientes que se les d de alta el mismo da no se les permitir manejar a casa.  (Patients discharged on the day of surgery will not be allowed to drive home)   Use ropa suelta y cmoda de regreso a casa. (wear loose comfortable clothes for ride home)      Endoscopa alta en los adultos, cuidados posteriores Upper Endoscopy, Adult, Care After Despus del procedimiento, es normal tener dolor de Investment banker, operational. Tambin es normal tener lo siguiente: Leve dolor o Manufacturing systems engineer. Distensin abdominal. Nuseas. Siga estas indicaciones en su casa: Las siguientes instrucciones pueden ayudarlo a cuidarse en casa. El mdico podr darle instrucciones ms especficas. Hable con su mdico si  tiene preguntas. Si le administraron un sedante durante el procedimiento, puede afectarlo por varias horas. No conduzca ni opere maquinaria hasta que el mdico le indique que es seguro [place]. Si va a marcharse a su casa inmediatamente despus del procedimiento, pdale a un adulto responsable que: Lo lleve a su casa desde el hospital o la clnica. No se le permitir conducir. Lo cuide durante el [employer] indiquen. Siga las instrucciones del mdico respecto de lo que puede comer y beber. Retome sus actividades normales como se lo haya indicado el mdico. Pregntele al mdico qu actividades son seguras para usted. Use los medicamentos de venta libre y los recetados solamente como se lo haya indicado el mdico. Comunquese con un mdico si: Tiene dolor de garganta que dura ms de Optician, dispensing. Tiene dificultad para tragar. Tiene fiebre. Solicite ayuda de inmediato si: Vomita sangre o el vmito tiene un aspecto similar al poso del caf. Las heces son sanguinolentas, negras o  alquitranadas. Tiene un dolor de garganta muy intenso o no puede tragar. Tiene dificultad para respirar o dolor muy intenso en el pecho o el abdomen. Estos sntomas pueden Sales executive. Solicite ayuda de inmediato. Llame al 911. No espere a ver si los sntomas desaparecen. No conduzca por sus propios medios [employer]. Resumen Despus del procedimiento, es normal sentir dolor de garganta, molestias leves en el estmago, distensin abdominal y nuseas. Si le administraron un sedante durante el procedimiento, puede afectarlo por varias horas. No conduzca hasta que el mdico diga que es [place]. Siga las instrucciones del mdico respecto de lo que puede comer y beber. Retome sus actividades normales como se lo haya indicado el mdico. Esta informacin no tiene Marine scientist el consejo del mdico. Asegrese de hacerle al mdico cualquier pregunta que tenga. Document Revised: 06/07/2021 Document Reviewed:  06/07/2021 Elsevier Patient Education  [place] en los adultos, cuidados posteriores Colonoscopy, Adult, Care After Despus de una colonoscopa, es normal tener lo siguiente: Una pequea cantidad de sangre en la materia fecal (heces) durante 24 horas. Gases. Clicos leves o distensin en el vientre (abdomen). Siga estas instrucciones en su casa: El mdico podr darle ms instrucciones. Si tiene problemas, llame al mdico. Comida y bebida  Beba suficiente lquido para mantener el pis (la [place]) de color amarillo plido. Siga las instrucciones del mdico respecto de lo que no puede comer o beber. Reanude la dieta normal como se lo haya indicado el mdico. Evite los alimentos pesados o fritos que son difciles de Publishing copy. Actividad Haga reposo como se lo haya indicado el mdico. Aquadale y camine un poco cada 1 a 2 horas. Pida ayuda si se siente dbil o inestable. Retome sus actividades normales cuando el mdico le diga que es seguro. Para aliviar los clicos y la hinchazn:  Intente caminar por la casa. Si se lo indican, pngase calor en el vientre. Hgalo como se lo haya indicado el mdico. Use la fuente de calor que el mdico le recomiende, como una compresa de calor hmedo o una almohadilla trmica. Coloque una toalla entre la piel y la fuente de Freight forwarder. Aplique calor durante 20 a 30 minutos. Retire la fuente de calor si la piel se le pone de color rojo brillante. Esto es [employer]. Si no puede sentir dolor, calor o fro, tiene un mayor riesgo de [place]. Instrucciones generales Si le administraron un sedante durante el procedimiento, no conduzca ni use mquinas hasta que el mdico le indique que es seguro [place]. Un sedante es un medicamento que ayuda a [place]. Durante las primeras 24 horas despus del procedimiento: No firme documentos importantes. No beba alcohol. Haga sus actividades diarias ms lentamente que lo normal. Coma alimentos que sean  blandos y fciles de Publishing copy. Use los medicamentos de venta libre y los recetados solamente como se lo haya indicado el mdico. Concurra a todas las visitas de seguimiento. Comunquese con un mdico si: Tiene sangre en la materia fecal 2 o 3 das despus del procedimiento. Solicite ayuda de inmediato si: Hay ms que una pequea cantidad de [employer] materia fecal. Observa grandes grumos de tejido (cogulos de sangre) en la materia fecal. Tiene el abdomen hinchado. Tiene ganas de vomitar (nuseas). Vomita. Tiene fiebre. Tiene dolor en el vientre que empeora y no se alivia con los medicamentos. Estos sntomas pueden Sales executive. Solicite ayuda de inmediato. Llame al 911. No espere a ver si los sntomas desaparecen. No conduzca por sus propios  medios [employer]. Resumen Despus de una colonoscopa, es normal tener una pequea cantidad de [employer]. Tambin puede tener clicos o hinchazn leves en el abdomen. Si le administraron un sedante durante el procedimiento, no conduzca ni use mquinas hasta que el [employer] indique que es seguro [place]. Un sedante es un medicamento que ayuda a [place]. Obtenga ayuda de inmediato si tiene [employer], tiene ganas de Training and development officer, tiene fiebre o Conservation officer, historic buildings abdominal [place]. Esta informacin no tiene Marine scientist el consejo del mdico. Asegrese de hacerle al mdico cualquier pregunta que tenga. Document Revised: 10/13/2020 Document Reviewed: 10/13/2020 Elsevier Patient Education  [place], cuidados posteriores Monitored Anesthesia Care, Care After La siguiente informacin ofrece orientacin sobre cmo cuidarse despus del procedimiento. El mdico tambin podr darle instrucciones ms especficas. Comunquese con el mdico si tiene problemas o preguntas. Qu puedo esperar despus del procedimiento? Despus del procedimiento, es normal tener los siguientes  sntomas: Cansancio. Poca o nula memoria sobre lo que ocurri durante el procedimiento o despus. Incapacidad para tomar decisiones. Nuseas o vmitos. Algo de problemas con el equilibrio. Siga estas instrucciones en su casa: Durante el perodo de [employer] le haya indicado el mdico:  Descanse. No participe en actividades que impliquen posibles cadas o lesiones. No conduzca ni opere maquinaria. No beba alcohol. No tome somnferos ni medicamentos que causen somnolencia. No tome decisiones trascendentes ni firme documentos importantes. No cuide a nios por su cuenta. Medicamentos Use los medicamentos de venta libre y los recetados solamente como lo haya indicado el mdico. Si le recetaron antibiticos, tmelos como se lo haya indicado el mdico. No deje de usar el antibitico aunque comience a Sports administrator. Comida y bebida Siga las instrucciones del mdico respecto de lo que puede comer y Electronics engineer. Beber suficiente lquido como para Theatre manager la orina de color amarillo plido. Si vomita: En la medida en que pueda, beba lquidos transparentes lentamente y en pequeas cantidades. Los lquidos transparentes incluyen agua, cubitos de hielo, [place] deportivas bajas en caloras y [place] de fruta rebajado con agua (jugo de fruta diluido). En la medida en que pueda, consuma alimentos livianos y suaves en pequeas cantidades. Estos alimentos incluyen bananas, compota de [place], arroz, carnes [place], tostadas y galletas saladas. Instrucciones generales  Pida a un adulto responsable que se quede con usted durante el tiempo que se le indique. Es importante tener a alguien que lo ayude a cuidarse hasta que est despierto y Press photographer. Si tiene apnea del sueo, la [place] y algunos medicamentos pueden elevar su riesgo de tener problemas respiratorios. Siga las instrucciones del mdico respecto al uso del dispositivo para dormir: Cuando est durmiendo. Incluso durante las siestas diurnas. Mientras tome  analgsicos recetados, medicamentos para dormir o medicamentos que producen somnolencia. No consuma ningn producto que contenga nicotina o tabaco. Estos productos incluyen cigarrillos, tabaco para Higher education careers adviser y aparatos de vapeo, como los Psychologist, sport and exercise. Si necesita ayuda para dejar de fumar, consulte al mdico. Comunquese con un mdico si: Sienta nuseas o vomita cada vez que come o bebe. Siente que va a desvanecerse. An est somnoliento y tiene dificultad para mantener el equilibrio despus de 24 horas. Aparece una erupcin cutnea. Tiene fiebre. Tiene enrojecimiento o hinchazn alrededor del lugar de la va intravenosa (i.v.). Solicite ayuda de inmediato si: Tiene dificultad para respirar. Tiene nueva confusin despus de llegar a casa. Estos sntomas pueden Sales executive. Solicite ayuda de inmediato. Llame al 911. No espere a ver  si los sntomas desaparecen. No conduzca por sus propios medios [employer]. Esta informacin no tiene Marine scientist el consejo del mdico. Asegrese de hacerle al mdico cualquier pregunta que tenga. Document Revised: 07/21/2021 Document Reviewed: 07/21/2021 Elsevier Patient Education  [place].

## 2022-04-06 ENCOUNTER — Encounter (HOSPITAL_COMMUNITY)
Admission: RE | Admit: 2022-04-06 | Discharge: 2022-04-06 | Disposition: A | Discharge status: Home / Self Care | Payer: Self-pay | Source: Ambulatory Visit | Attending: Internal Medicine | Admitting: Internal Medicine

## 2022-04-06 ENCOUNTER — Other Ambulatory Visit: Payer: Self-pay

## 2022-04-06 NOTE — Progress Notes (Signed)
Spanish interpreter video [place] [phone].  Patient verified name and DOB.  History reviewed.  Pre op instructions and letter from office reviewed with patient.  He verbalized understanding of procedures and consent obtained.  Patient knows to have someone drive him home and stay with him after procedure.

## 2022-04-10 ENCOUNTER — Ambulatory Visit (HOSPITAL_COMMUNITY): Payer: Self-pay | Admitting: Anesthesiology

## 2022-04-10 ENCOUNTER — Encounter (HOSPITAL_COMMUNITY): Payer: Self-pay

## 2022-04-10 ENCOUNTER — Encounter (HOSPITAL_COMMUNITY)
Admission: RE | Disposition: A | Discharge status: Home / Self Care | Payer: Self-pay | Source: Ambulatory Visit | Attending: Internal Medicine

## 2022-04-10 ENCOUNTER — Other Ambulatory Visit: Payer: Self-pay

## 2022-04-10 ENCOUNTER — Ambulatory Visit (HOSPITAL_COMMUNITY)
Admission: RE | Admit: 2022-04-10 | Discharge: 2022-04-10 | Disposition: A | Discharge status: Home / Self Care | Payer: Self-pay | Source: Ambulatory Visit | Attending: Internal Medicine | Admitting: Internal Medicine

## 2022-04-10 ENCOUNTER — Ambulatory Visit (HOSPITAL_BASED_OUTPATIENT_CLINIC_OR_DEPARTMENT_OTHER): Payer: Self-pay | Admitting: Anesthesiology

## 2022-04-10 DIAGNOSIS — K2970 Gastritis, unspecified, without bleeding: Secondary | ICD-10-CM

## 2022-04-10 DIAGNOSIS — K2950 Unspecified chronic gastritis without bleeding: Secondary | ICD-10-CM | POA: Insufficient documentation

## 2022-04-10 DIAGNOSIS — K219 Gastro-esophageal reflux disease without esophagitis: Secondary | ICD-10-CM | POA: Insufficient documentation

## 2022-04-10 DIAGNOSIS — D122 Benign neoplasm of ascending colon: Secondary | ICD-10-CM

## 2022-04-10 DIAGNOSIS — K7460 Unspecified cirrhosis of liver: Secondary | ICD-10-CM

## 2022-04-10 DIAGNOSIS — I10 Essential (primary) hypertension: Secondary | ICD-10-CM | POA: Insufficient documentation

## 2022-04-10 DIAGNOSIS — D127 Benign neoplasm of rectosigmoid junction: Secondary | ICD-10-CM

## 2022-04-10 DIAGNOSIS — K7010 Alcoholic hepatitis without ascites: Secondary | ICD-10-CM

## 2022-04-10 DIAGNOSIS — Z1211 Encounter for screening for malignant neoplasm of colon: Secondary | ICD-10-CM | POA: Insufficient documentation

## 2022-04-10 DIAGNOSIS — K648 Other hemorrhoids: Secondary | ICD-10-CM | POA: Insufficient documentation

## 2022-04-10 DIAGNOSIS — Z87891 Personal history of nicotine dependence: Secondary | ICD-10-CM | POA: Insufficient documentation

## 2022-04-10 DIAGNOSIS — D125 Benign neoplasm of sigmoid colon: Secondary | ICD-10-CM | POA: Insufficient documentation

## 2022-04-10 DIAGNOSIS — K7030 Alcoholic cirrhosis of liver without ascites: Secondary | ICD-10-CM | POA: Insufficient documentation

## 2022-04-10 DIAGNOSIS — Z8719 Personal history of other diseases of the digestive system: Secondary | ICD-10-CM | POA: Insufficient documentation

## 2022-04-10 DIAGNOSIS — Z79899 Other long term (current) drug therapy: Secondary | ICD-10-CM | POA: Insufficient documentation

## 2022-04-10 HISTORY — PX: ESOPHAGOGASTRODUODENOSCOPY (EGD) WITH PROPOFOL: SHX5813

## 2022-04-10 HISTORY — PX: COLONOSCOPY WITH PROPOFOL: SHX5780

## 2022-04-10 HISTORY — PX: BIOPSY: SHX5522

## 2022-04-10 HISTORY — PX: POLYPECTOMY: SHX5525

## 2022-04-10 SURGERY — COLONOSCOPY WITH PROPOFOL
Anesthesia: General

## 2022-04-10 MED ORDER — STERILE WATER FOR IRRIGATION IR SOLN
Status: DC | PRN
  Administered 2022-04-10: 100 mL

## 2022-04-10 MED ORDER — PROPOFOL 500 MG/50ML IV EMUL
INTRAVENOUS | Status: DC | PRN
  Administered 2022-04-10: 125 ug/kg/min via INTRAVENOUS

## 2022-04-10 MED ORDER — LACTATED RINGERS IV SOLN: INTRAVENOUS | Status: DC

## 2022-04-10 MED ORDER — PROPOFOL 10 MG/ML IV BOLUS
INTRAVENOUS | Status: DC | PRN
  Administered 2022-04-10: 30 mg via INTRAVENOUS
  Administered 2022-04-10: 50 mg via INTRAVENOUS
  Administered 2022-04-10: 100 mg via INTRAVENOUS

## 2022-04-10 NOTE — Op Note (Signed)
[facility] Patient Name: Danny Arnold Procedure Date: 04/10/2022 10:28 AM MRN: [id number] Date of Birth: January 22, 1976 Attending MD: Elon Alas. Abbey Chatters , [place], [id number] CSN: [id number] Age: 47 Admit Type: Outpatient Procedure:                Colonoscopy Indications:              Screening for colorectal malignant neoplasm Providers:                Elon Alas. Abbey Chatters, DO, Tammy Vaught, RN, Caprice Kluver, Crystal Page, Illene Labrador Referring MD:              Medicines:                See the Anesthesia note for documentation of the                            administered medications Complications:            No immediate complications. Estimated Blood Loss:     Estimated blood loss was minimal. Procedure:                Pre-Anesthesia Assessment:                           - The anesthesia plan was to use monitored                            anesthesia care (MAC).                           After obtaining informed consent, the colonoscope                            was passed under direct vision. Throughout the                            procedure, the patient's blood pressure, pulse, and                            oxygen saturations were monitored continuously. The                            [id number] scope was introduced through                            the anus and advanced to the the cecum, identified                            by appendiceal orifice and ileocecal valve. The                            colonoscopy was performed without difficulty. The                            patient tolerated  the procedure well. The quality                            of the bowel preparation was evaluated using the                            BBPS [facility] Bowel Preparation Scale) with scores                            of: Right Colon = 2 (minor amount of residual                            staining, small fragments of stool and/or opaque                             liquid, but mucosa seen well), Transverse Colon = 3                            (entire mucosa seen well with no residual staining,                            small fragments of stool or opaque liquid) and Left                            Colon = 3 (entire mucosa seen well with no residual                            staining, small fragments of stool or opaque                            liquid). The total BBPS score equals 8. The quality                            of the bowel preparation was good. Scope In: 10:50:44 AM Scope Out: 11:09:36 AM Scope Withdrawal Time: 0 hours 13 minutes 43 seconds  Total Procedure Duration: 0 hours 18 minutes 52 seconds  Findings:      The perianal and digital rectal examinations were normal.      Non-bleeding internal hemorrhoids were found during endoscopy.      A 4 mm polyp was found in the ascending colon. The polyp was sessile.       The polyp was removed with a cold snare. Resection and retrieval were       complete.      An 8 mm polyp was found in the recto-sigmoid colon. The polyp was       sessile. The polyp was removed with a cold snare. Resection and       retrieval were complete.      The exam was otherwise without abnormality. Impression:               - Non-bleeding internal hemorrhoids.                           - One 4 mm polyp in the ascending colon, removed  with a cold snare. Resected and retrieved.                           - One 8 mm polyp at the recto-sigmoid colon,                            removed with a cold snare. Resected and retrieved.                           - The examination was otherwise normal. Moderate Sedation:      Per Anesthesia Care Recommendation:           - Patient has a contact number available for                            emergencies. The signs and symptoms of potential                            delayed complications were discussed with the                             patient. Return to normal activities tomorrow.                            Written discharge instructions were provided to the                            patient.                           - Resume previous diet.                           - Continue present medications.                           - Await pathology results.                           - Repeat colonoscopy in 5 years for surveillance.                           - Return to GI clinic in 6 months. Procedure Code(s):        --- Professional ---                           [phone], Colonoscopy, flexible; with removal of                            tumor(s), polyp(s), or other lesion(s) by snare                            technique Diagnosis Code(s):        --- Professional ---  Z12.11, Encounter for screening for malignant                            neoplasm of colon                           D12.2, Benign neoplasm of ascending colon                           D12.7, Benign neoplasm of rectosigmoid junction                           K64.8, Other hemorrhoids CPT copyright 2022 American Medical Association. All rights reserved. The codes documented in this report are preliminary and upon coder review may  be revised to meet current compliance requirements. Elon Alas. Abbey Chatters, DO [place] Abbey Chatters, DO 04/10/2022 11:12:55 AM This report has been signed electronically. Number of Addenda: 0

## 2022-04-10 NOTE — Discharge Instructions (Signed)
Su EGD revel inflamacin en su estmago. Tom biopsias de esto para descartar una infeccin con una bacteria llamada H. pylori. Espere los resultados de patologa, mi oficina se Cytogeneticist con usted.  Continuar con omeprazol  No vi ninguna varices esofgicas. Repetir EGD en 3 aos.  Su colonoscopia revel 2 plipos que elimin con xito. Espere los resultados de patologa, mi oficina se Cytogeneticist con usted. Recomiendo repetir la colonoscopia en 5 aos con fines de vigilancia.  Seguimiento en clnica gastrointestinal en 6 meses.

## 2022-04-10 NOTE — Anesthesia Preprocedure Evaluation (Signed)
Anesthesia Evaluation  Patient identified by MRN, date of birth, ID band Patient awake    Reviewed: Allergy & Precautions, H&P , NPO status , Patient's Chart, lab work & pertinent test results  Airway Mallampati: III  TM Distance: >3 FB Neck ROM: Full    Dental  (+) Missing, Dental Advisory Given   Pulmonary former smoker   Pulmonary exam normal breath sounds clear to auscultation       Cardiovascular negative cardio ROS Normal cardiovascular exam Rhythm:Regular Rate:Normal     Neuro/Psych  Neuromuscular disease  negative psych ROS   GI/Hepatic ,GERD  Medicated and Controlled,,(+) Cirrhosis   Esophageal Varices  substance abuse (last use 10 years ago)  alcohol use, Hepatitis -, Toxin Related  Endo/Other  negative endocrine ROS    Renal/GU negative Renal ROS  negative genitourinary   Musculoskeletal  (+) Arthritis , Osteoarthritis,    Abdominal   Peds negative pediatric ROS (+)  Hematology  (+) Blood dyscrasia, anemia   Anesthesia Other Findings   Reproductive/Obstetrics negative OB ROS                             Anesthesia Physical Anesthesia Plan  ASA: 3  Anesthesia Plan: General   Post-op Pain Management: Minimal or no pain anticipated   Induction: Intravenous  PONV Risk Score and Plan: Propofol infusion  Airway Management Planned: Nasal Cannula and Natural Airway  Additional Equipment:   Intra-op Plan:   Post-operative Plan:   Informed Consent: I have reviewed the patients History and Physical, chart, labs and discussed the procedure including the risks, benefits and alternatives for the proposed anesthesia with the patient or authorized representative who has indicated his/her understanding and acceptance.     Dental advisory given  Plan Discussed with: CRNA and Surgeon  Anesthesia Plan Comments:        Anesthesia Quick Evaluation

## 2022-04-10 NOTE — Transfer of Care (Signed)
Immediate Anesthesia Transfer of Care Note  Patient: Danny Arnold  Procedure(s) Performed: COLONOSCOPY WITH PROPOFOL ESOPHAGOGASTRODUODENOSCOPY (EGD) WITH PROPOFOL BIOPSY POLYPECTOMY  Patient Location: Short Stay  Anesthesia Type:General  Level of Consciousness: awake  Airway & Oxygen Therapy: Patient Spontanous Breathing  Post-op Assessment: Report given to RN  Post vital signs: Reviewed and stable  Last Vitals:  Vitals Value Taken Time  BP    Temp    Pulse    Resp    SpO2      Last Pain:  Vitals:   04/10/22 1042  TempSrc:   PainSc: 0-No pain      Patients Stated Pain Goal: 2 ([phone])  Complications: No notable events documented.

## 2022-04-10 NOTE — OR Nursing (Signed)
Interpretor [id number] Danny Arnold patient states he took 2 laxatives given to him by pharmacist . Patient states his bowels are running clear liquid. Dr. Abbey Chatters notified

## 2022-04-10 NOTE — Interval H&P Note (Signed)
History and Physical Interval Note:  04/10/2022 10:13 AM  Danny Arnold  has presented today for surgery, with the diagnosis of screening TCS, surveillance EGD,cirrhosis.  The various methods of treatment have been discussed with the patient and family. After consideration of risks, benefits and other options for treatment, the patient has consented to  Procedure(s) with comments: COLONOSCOPY WITH PROPOFOL (N/A) - 11:00 am ESOPHAGOGASTRODUODENOSCOPY (EGD) WITH PROPOFOL (N/A) as a surgical intervention.  The patient's history has been reviewed, patient examined, no change in status, stable for surgery.  I have reviewed the patient's chart and labs.  Questions were answered to the patient's satisfaction.     [name]

## 2022-04-10 NOTE — Op Note (Signed)
[facility] Patient Name: Danny Arnold Procedure Date: 04/10/2022 10:31 AM MRN: [id number] Date of Birth: 1975-05-13 Attending MD: Elon Alas. Abbey Chatters , [place], [id number] CSN: [id number] Age: 47 Admit Type: Outpatient Procedure:                Upper GI endoscopy Indications:              Cirrhosis rule out esophageal varices Providers:                Elon Alas. Abbey Chatters, DO, Tammy Vaught, RN, Caprice Kluver, Crystal Page, Illene Labrador Referring MD:              Medicines:                See the Anesthesia note for documentation of the                            administered medications Complications:            No immediate complications. Estimated Blood Loss:     Estimated blood loss was minimal. Procedure:                Pre-Anesthesia Assessment:                           - The anesthesia plan was to use monitored                            anesthesia care (MAC).                           After obtaining informed consent, the endoscope was                            passed under direct vision. Throughout the                            procedure, the patient's blood pressure, pulse, and                            oxygen saturations were monitored continuously. The                            [id number] scope was introduced through the                            mouth, and advanced to the second part of duodenum.                            The upper GI endoscopy was accomplished without                            difficulty. The patient tolerated the procedure  well. Scope In: 10:47:11 AM Scope Out: 10:50:16 AM Total Procedure Duration: 0 hours 3 minutes 5 seconds  Findings:      There is no endoscopic evidence of varices in the entire esophagus.      The Z-line was regular and was found 39 cm from the incisors.      Patchy mild inflammation characterized by erythema was found in the       gastric body and in  the gastric antrum. Biopsies were taken with a cold       forceps for Helicobacter pylori testing.      The duodenal bulb, first portion of the duodenum and second portion of       the duodenum were normal. Impression:               - Z-line regular, 39 cm from the incisors.                           - Gastritis. Biopsied.                           - Normal duodenal bulb, first portion of the                            duodenum and second portion of the duodenum. Moderate Sedation:      Per Anesthesia Care Recommendation:           - Patient has a contact number available for                            emergencies. The signs and symptoms of potential                            delayed complications were discussed with the                            patient. Return to normal activities tomorrow.                            Written discharge instructions were provided to the                            patient.                           - Resume previous diet.                           - Continue present medications.                           - Await pathology results.                           - Repeat upper endoscopy in 3 years for                            surveillance.                           -  Use a proton pump inhibitor PO daily.                           - Return to GI clinic in 6 months. Procedure Code(s):        --- Professional ---                           [id number], Esophagogastroduodenoscopy, flexible,                            transoral; with biopsy, single or multiple Diagnosis Code(s):        --- Professional ---                           K29.70, Gastritis, unspecified, without bleeding                           K74.60, Unspecified cirrhosis of liver CPT copyright 2022 American Medical Association. All rights reserved. The codes documented in this report are preliminary and upon coder review may  be revised to meet current compliance requirements. Elon Alas. Abbey Chatters,  DO Carson Abbey Chatters, DO 04/10/2022 10:54:02 AM This report has been signed electronically. Number of Addenda: 0

## 2022-04-10 NOTE — Anesthesia Postprocedure Evaluation (Signed)
Anesthesia Post Note  Patient: Danny Arnold  Procedure(s) Performed: COLONOSCOPY WITH PROPOFOL ESOPHAGOGASTRODUODENOSCOPY (EGD) WITH PROPOFOL BIOPSY POLYPECTOMY  Patient location during evaluation: Short Stay Anesthesia Type: General Level of consciousness: awake and alert Pain management: pain level controlled Vital Signs Assessment: post-procedure vital signs reviewed and stable Respiratory status: spontaneous breathing Cardiovascular status: blood pressure returned to baseline and stable Postop Assessment: no apparent nausea or vomiting Anesthetic complications: no   No notable events documented.   Last Vitals:  Vitals:   04/10/22 1008  BP: 128/84  Pulse: 90  Temp: 37.1 C  SpO2: 95%    Last Pain:  Vitals:   04/10/22 1042  TempSrc:   PainSc: 0-No pain                 [employer]

## 2022-04-14 LAB — SURGICAL PATHOLOGY

## 2022-04-14 NOTE — Progress Notes (Signed)
Estimado Mats,  Informe al paciente mediante una llamada telefnica o una carta que los plipos extirpados eran adenomas tubulares. Necesitaremos repetir la colonoscopia en 5 aos como comentamos en el postoperatorio.  Informe al paciente mediante una llamada telefnica o una carta que las biopsias mostraron inflamacin en el [place]. Todava estamos esperando las pruebas de H. Pylori y llamaremos para informarnos. Contine con el IPP diario. Evite los [place].   Seguimiento con GI segn lo programado previamente.     Thompson Caul, CMA

## 2022-04-17 ENCOUNTER — Encounter (HOSPITAL_COMMUNITY): Payer: Self-pay | Admitting: Internal Medicine

## 2022-07-03 ENCOUNTER — Ambulatory Visit: Payer: Self-pay | Attending: Internal Medicine | Admitting: Internal Medicine

## 2022-07-03 ENCOUNTER — Other Ambulatory Visit: Payer: Self-pay | Admitting: Internal Medicine

## 2022-07-03 ENCOUNTER — Encounter: Payer: Self-pay | Admitting: Internal Medicine

## 2022-07-03 ENCOUNTER — Other Ambulatory Visit: Payer: Self-pay

## 2022-07-03 VITALS — BP 125/83 | HR 91 | Temp 98.3°F | Resp 16 | Ht 67.0 in | Wt 184.0 lb

## 2022-07-03 DIAGNOSIS — G8929 Other chronic pain: Secondary | ICD-10-CM

## 2022-07-03 DIAGNOSIS — K7030 Alcoholic cirrhosis of liver without ascites: Secondary | ICD-10-CM

## 2022-07-03 DIAGNOSIS — K2960 Other gastritis without bleeding: Secondary | ICD-10-CM

## 2022-07-03 DIAGNOSIS — M25521 Pain in right elbow: Secondary | ICD-10-CM

## 2022-07-03 DIAGNOSIS — M25522 Pain in left elbow: Secondary | ICD-10-CM

## 2022-07-03 MED ORDER — DICLOFENAC SODIUM 1 % EX GEL
2.0000 g | Freq: Four times a day (QID) | CUTANEOUS | 2 refills | Status: DC
  Filled 2022-07-03: qty 100, 13d supply, fill #0

## 2022-07-03 NOTE — Progress Notes (Signed)
Follow up hand cramping Presently has cramping in hand Bilateral hand pain is  Left hand- 8/10 and swelling x 2 days Right hand 6/10 x 1 week  OTC - Icy hot

## 2022-07-03 NOTE — Progress Notes (Signed)
Patient ID: Danny Arnold, male    DOB: 1975/03/13  MRN: [id number]  CC: Numbness   Subjective: Danny Arnold is a 47 y.o. male who presents for chronic ds management His concerns today include:  Hx of ETOH cirrhosis, esophageal varices followed by GI   AMN Language interpreter used during this encounter. #[id number] Arnold   Visit 10/23/223:  Pt c/o having cramps in hands for over 1 yr. Reports it is the same issue as last yr.  09/23/2021:  Of note pt had c/o weakness in hands last yr.  Exam was normal.  He was referred to neurology.  He saw Dr. Allena Katz.  Her assessment was that he does not have a peripheral neuropathy but most likely tendinopathy of his hands.  EMG was normal.  He was referred to sports medicine.  He saw Dr. Denyse Amass.  His assessment was that the patient's symptoms corresponds with medial epicondylitis on the right and lateral epicondylitis on the left and mild distal biceps tendinitis bilaterally.  Patient referred to physical therapy. Completed P.T 2 days ago.  He notes significant improvement.   He was found to have Vit B12 in low nl range (383) after last visit with me.  I recommended taking OTC vit B12.  He took for several wks but felt it caused cramps in hands so he discontinued taking.  On that visit, patient pointed to the lateral forearm from the lateral epicondyles to about the mid forearms as areas of cramps.  We refilled the gabapentin.  We tried him with low-dose Flexeril to take about 3 times a week as needed.  Today: Follow cramps in both arms Presently has cramping in upper arms around the elbows Does not feel the Flexeril helped Radiates pain on LT 8/10 and swelling x 2 days Right arm/elbow 6/10 x 1 week  ETOH cirrhosis:  Had repeat EGD 03/2022.  No varices seen in EDG. Last LFT 03/21/2022 normal.   Liver US done 03/2022 revealed: IMPRESSION: 1. No acute abnormality identified. 2. Diffuse increased echotexture of the liver.  This is a nonspecific finding but can be seen in fatty infiltration of liver. 3. Previously noted lesion in the anterior left lobe liver is not definitely seen on today's exam.   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     omeprazole (PRILOSEC) 20 MG capsule 1 PO 30 MINS PRIOR TO BREAKFAST. 90 capsule 3   No current facility-administered medications on file prior to visit.    No Known Allergies  Social History   Socioeconomic History   Marital status: Single    Spouse name: Not on file   Number of children: Not on file   Years  of education: Not on file   Highest education level: Not on file  Occupational History   Not on file  Tobacco Use   Smoking status: Former    Packs/day: 0.25    Years: 15.00    Additional pack years: 0.00    Total pack years: 3.75    Types: Cigarettes    Quit date: 04/03/2012    Years since quitting: 10.2   Smokeless tobacco: Never   Tobacco comments:    Quit smoking more than a year ago  Vaping Use   Vaping Use: Never used  Substance and Sexual Activity   Alcohol use: No    Comment: As of 03/31/21: heavy etoh until 02/2012- none  since 2014.   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   Social Determinants of Health   Financial Resource Strain: Not on file  Food Insecurity: Not on file  Transportation Needs: Not on file  Physical Activity: Not on file  Stress: Not on file  Social Connections: Not on file  Intimate Partner Violence: Not on file    Family History  Problem Relation Age of Onset   Migraines Mother    Colon cancer Neg Hx     Past Surgical History:  Procedure Laterality Date   BIOPSY  04/10/2022   Procedure: BIOPSY;  Surgeon: Lanelle Bal, DO;  [facility]: [facility];  Service: Endoscopy;;   COLONOSCOPY N/A 11/25/2012   [facility]   COLONOSCOPY WITH PROPOFOL N/A 04/10/2022   Procedure: COLONOSCOPY WITH PROPOFOL;  Surgeon: Lanelle Bal, DO;  Location: [facility];  Service: Endoscopy;  Laterality: N/A;  11:00 am   ESOPHAGEAL BANDING N/A 04/08/2019   Procedure: ESOPHAGEAL BANDING;  Surgeon: West Bali, MD;  Location: [facility];  Service: Endoscopy;  Laterality: N/A;   ESOPHAGOGASTRODUODENOSCOPY N/A 11/25/2012   GRADE 1 VARICES   ESOPHAGOGASTRODUODENOSCOPY N/A 04/20/2016   Procedure: ESOPHAGOGASTRODUODENOSCOPY (EGD);  Surgeon: West Bali, MD;  Location: [facility];  Service: Endoscopy;  Laterality: N/A;  8:30am   ESOPHAGOGASTRODUODENOSCOPY (EGD) WITH PROPOFOL N/A 04/08/2019   Grade 1 varices, moderate gastritis and duodenitis, surveillance due in 2024.   ESOPHAGOGASTRODUODENOSCOPY (EGD) WITH PROPOFOL N/A 04/10/2022   Procedure: ESOPHAGOGASTRODUODENOSCOPY (EGD) WITH PROPOFOL;  Surgeon: Lanelle Bal, DO;  [facility]: [facility];  Service: Endoscopy;  Laterality: N/A;   HAND SURGERY Left    removal of foreign body and tendon repair.   HEMORRHOID BANDING  2014 x1   POLYPECTOMY  04/10/2022   Procedure: POLYPECTOMY;  Surgeon: Lanelle Bal, DO;  Location: [facility];  Service: Endoscopy;;   UMBILICAL HERNIA REPAIR N/A 08/30/2015   Procedure: UMBILICAL HERNIORRHAPHY WITH MESH;  Surgeon: Franky Macho, MD;  Location: [facility];  Service: General;  Laterality: N/A;    ROS: Review of Systems Negative except as stated above  PHYSICAL EXAM: BP 125/83   Pulse 91   Temp 98.3 F (36.8 C) (Oral)   Resp 16   Ht 5\' 7"  (1.702 m)   Wt 184 lb (83.5 kg)   BMI 28.82 kg/m   Physical Exam   General appearance - alert, well appearing, middle-aged Hispanic male and in no distress Mental status - normal mood, behavior, speech, dress, motor activity, and thought processes Musculoskeletal -elbows: Mild discomfort on palpation of the medial and lateral epicondyles on both  elbows.  He has some swelling of the left elbow joint.  He has moderate discomfort with passive range of motion LT>RT elbow     Latest Ref Rng & Units 03/21/2022   10:56 AM 09/28/2021   10:58 AM 04/22/2021    9:36 AM  CMP  Glucose 70 - 99 mg/dL 161  096  94   BUN 6 - 20 mg/dL 13  18  14    Creatinine 0.61 - 1.24 mg/dL 0.45  4.09  8.11   Sodium 135 - 145 mmol/L 135  138  137   Potassium 3.5 - 5.1 mmol/L 3.7  3.8  3.9   Chloride 98 - 111 mmol/L 105  109  103   CO2 22 - 32 mmol/L 22  24  25    Calcium 8.9 - 10.3 mg/dL 8.8  8.9  9.3   Total Protein 6.5 - 8.1 g/dL 7.7  7.6  7.9   Total Bilirubin 0.3 - 1.2 mg/dL 0.8  0.7  1.2   Alkaline Phos 38 - 126 U/L 68  67  62   AST 15 - 41 U/L 24  21  22    ALT 0 - 44 U/L 28  25  25     Lipid Panel  No results found for: "CHOL", "TRIG", "HDL", "CHOLHDL", "VLDL", "LDLCALC", "LDLDIRECT"  CBC    Component Value Date/Time   WBC 8.3 03/21/2022 1056   RBC 5.87 (H) 03/21/2022 1056   HGB 17.7 (H) 03/21/2022 1056   HCT 51.5 03/21/2022 1056   PLT 243 03/21/2022 1056   MCV 87.7 03/21/2022 1056   MCH 30.2 03/21/2022 1056   MCHC 34.4 03/21/2022 1056   RDW 12.6 03/21/2022 1056   LYMPHSABS 1.9 03/21/2022 1056   MONOABS 0.7 03/21/2022 1056   EOSABS 0.2  03/21/2022 1056   BASOSABS 0.0 03/21/2022 1056    ASSESSMENT AND PLAN: 1. Chronic pain of right elbow I wonder whether he has some arthritis in the elbow joints.  I recommend that we get x-rays of both elbows.  I have prescribed some Voltaren gel for him to use 4 times a day. - diclofenac Sodium (VOLTAREN) 1 % GEL; Apply 2 g topically 4 (four) times daily.  Dispense: 100 g; Refill: 2 - DG Elbow Complete Right; Future  2. Chronic pain of left elbow - diclofenac Sodium (VOLTAREN) 1 % GEL; Apply 2 g topically 4 (four) times daily.  Dispense: 100 g; Refill: 2 - DG Elbow Complete Left; Future  3. Alcoholic cirrhosis of liver without ascites (HCC) Commended him on remaining alcohol free.    Patient was given the opportunity to ask questions.  Patient verbalized understanding of the plan and was able to repeat key elements of the plan.   This documentation was completed using Paediatric nurse.  Any transcriptional errors are unintentional.  No orders of the defined types were placed in this encounter.    Requested Prescriptions    No prescriptions requested or ordered in this encounter    No follow-ups on file.  Jonah Blue, MD, FACP

## 2022-07-04 ENCOUNTER — Other Ambulatory Visit: Payer: Self-pay

## 2022-07-04 MED ORDER — OMEPRAZOLE 20 MG PO CPDR
20.0000 mg | DELAYED_RELEASE_CAPSULE | Freq: Every day | ORAL | 2 refills | Status: DC
  Filled 2022-07-04: qty 90, 90d supply, fill #0

## 2022-07-11 ENCOUNTER — Other Ambulatory Visit: Payer: Self-pay

## 2022-07-24 ENCOUNTER — Ambulatory Visit: Payer: Self-pay | Attending: Family Medicine

## 2022-07-27 ENCOUNTER — Ambulatory Visit: Payer: Self-pay

## 2022-08-23 ENCOUNTER — Encounter: Payer: Self-pay | Admitting: Internal Medicine

## 2022-10-24 ENCOUNTER — Emergency Department (HOSPITAL_COMMUNITY): Payer: Self-pay

## 2022-10-24 ENCOUNTER — Other Ambulatory Visit: Payer: Self-pay

## 2022-10-24 ENCOUNTER — Encounter (HOSPITAL_COMMUNITY): Payer: Self-pay | Admitting: Emergency Medicine

## 2022-10-24 ENCOUNTER — Emergency Department (HOSPITAL_COMMUNITY)
Admission: EM | Admit: 2022-10-24 | Discharge: 2022-10-24 | Disposition: A | Discharge status: Home / Self Care | Payer: Worker's Compensation | Attending: Emergency Medicine | Admitting: Emergency Medicine

## 2022-10-24 ENCOUNTER — Emergency Department (HOSPITAL_COMMUNITY): Payer: Worker's Compensation

## 2022-10-24 DIAGNOSIS — S92011A Displaced fracture of body of right calcaneus, initial encounter for closed fracture: Secondary | ICD-10-CM | POA: Insufficient documentation

## 2022-10-24 DIAGNOSIS — W11XXXA Fall on and from ladder, initial encounter: Secondary | ICD-10-CM | POA: Insufficient documentation

## 2022-10-24 DIAGNOSIS — M79671 Pain in right foot: Secondary | ICD-10-CM | POA: Diagnosis present

## 2022-10-24 MED ORDER — OXYCODONE HCL 5 MG PO TABS
5.0000 mg | ORAL_TABLET | ORAL | 0 refills | Status: DC | PRN
Start: 2022-10-24 — End: 2023-02-02
  Filled 2022-10-24: qty 30, 5d supply, fill #0

## 2022-10-24 MED ORDER — HYDROCODONE-ACETAMINOPHEN 5-325 MG PO TABS
2.0000 | ORAL_TABLET | Freq: Once | ORAL | Status: DC
  Filled 2022-10-24: qty 2

## 2022-10-24 MED ORDER — OXYCODONE HCL 5 MG PO TABS
5.0000 mg | ORAL_TABLET | Freq: Once | ORAL | Status: AC
  Administered 2022-10-24: 5 mg via ORAL
  Filled 2022-10-24: qty 1

## 2022-10-24 NOTE — ED Notes (Signed)
Ortho contacted for devices, patient is 6ft 7in, shoe size 9.5

## 2022-10-24 NOTE — Progress Notes (Signed)
Orthopedic Tech Progress Note Patient Details:  Danny Arnold [birth year] [id number] Applied CAM boot and gave patient instructions on how to use crutches per order. Ortho Devices Type of Ortho Device: Crutches, CAM walker Ortho Device/Splint Location: LLE Ortho Device/Splint Interventions: Ordered, Application, Adjustment   Post Interventions Patient Tolerated: Well Instructions Provided: Adjustment of device, Care of device  Blase Mess 10/24/2022, 6:30 PM

## 2022-10-24 NOTE — ED Triage Notes (Signed)
Patient arrives with daughter stating patient fell at work a few hours ago from a ladder approx 8 ft. States he landed on boards on ground on the right side. Only c/o right foot pain.

## 2022-10-24 NOTE — ED Provider Notes (Signed)
[place] EMERGENCY DEPARTMENT AT [facility] Provider Note   CSN: [id number] Arrival date & time: 10/24/22  1053     History  Chief Complaint  Patient presents with   Fall from Ladder    8 ft    Danny Arnold is a 47 y.o. male.  Patient reports he fell 8 foot off of a ladder onto his right foot.  Patient complains of severe pain in his right foot and right ankle.  Patient denies any other areas of injury.  Patient states he did not strike his head he denies any neck pain he denies any chest or abdominal pain.  Patient reports he is not having any pain in his back.  Patient has been unable to tolerate weightbearing since falling.  Patient complains of swelling to his foot and bruising  The history is provided by the patient. No language interpreter was used.       Home Medications Prior to Admission medications   Medication Sig Start Date End Date Taking? Authorizing Provider  oxyCODONE (ROXICODONE) 5 MG immediate release tablet Take 1 tablet (5 mg total) by mouth every 4 (four) hours as needed for severe pain. 10/24/22  Yes Cheron Schaumann K, PA-C  Cyanocobalamin (B-12 PO) Take 1 tablet by mouth daily.    [provider]  diclofenac Sodium (VOLTAREN) 1 % GEL Apply 2 g topically 4 (four) times daily. 07/03/22   Marcine Matar, MD  omeprazole (PRILOSEC) 20 MG capsule Take 1 capsule (20 mg total) by mouth daily before 30 minutes before breakfast. 07/04/22   Marcine Matar, MD      Allergies    Patient has no known allergies.    Review of Systems   Review of Systems  All other systems reviewed and are negative.   Physical Exam Updated Vital Signs BP (!) 138/90   Pulse 74   Temp 98 F (36.7 C) (Oral)   Resp 18   Ht 5\' 7"  (1.702 m)   Wt 81.6 kg   SpO2 100%   BMI 28.19 kg/m  Physical Exam Vitals and nursing note reviewed.  Constitutional:      Appearance: He is well-developed.  HENT:     Head: Normocephalic.  Cardiovascular:      Rate and Rhythm: Normal rate.  Pulmonary:     Effort: Pulmonary effort is normal.     Breath sounds: Normal breath sounds.  Abdominal:     General: There is no distension.  Musculoskeletal:        General: Swelling, tenderness and signs of injury present.     Cervical back: Normal range of motion.     Comments: Swollen tender right foot and right ankle, decreased range of motion neurovascular neurosensory intact  Skin:    General: Skin is warm.  Neurological:     General: No focal deficit present.     Mental Status: He is alert and oriented to person, place, and time.     ED Results / Procedures / Treatments   Labs (all labs ordered are listed, but only abnormal results are displayed) Labs Reviewed - No data to display  EKG None  Radiology CT Foot Right Wo Contrast  Result Date: 10/24/2022 CLINICAL DATA:  Fall 8 feet from ladder, fracture foot. EXAM: CT OF THE RIGHT FOOT WITHOUT CONTRAST TECHNIQUE: Multidetector CT imaging of the right foot was performed according to the standard protocol. Multiplanar CT image reconstructions were also generated. RADIATION DOSE REDUCTION: This exam was performed according  to the departmental dose-optimization program which includes automated exposure control, adjustment of the mA and/or kV according to patient size and/or use of iterative reconstruction technique. COMPARISON:  10/24/2022. FINDINGS: Bones/Joint/Cartilage There is a minimally displaced comminuted fracture of the calcaneus involving the subtalar joint. No dislocation is seen. Mild degenerative changes are present at the navicular bone. Ligaments Suboptimally assessed by CT. Muscles and Tendons Within normal limits. Soft tissues Subcutaneous edema is noted.  Vascular calcifications are present. IMPRESSION: Minimally displaced comminuted fracture of the calcaneus. Electronically Signed   By: Thornell Sartorius M.D.   On: 10/24/2022 21:11   DG Ankle Complete Right  Result Date:  10/24/2022 CLINICAL DATA:  Pain after fall EXAM: RIGHT ANKLE - COMPLETE 3 VIEW COMPARISON:  None Available. FINDINGS: Soft tissue swelling about the ankle. There is comminuted fracture along the midbody of the calcaneus extending along the plantar aspect as well as extending towards the subtalar joint posteriorly. No additional fracture or dislocation. Vascular calcifications. Preserved joint spaces. IMPRESSION: Comminuted calcaneal fracture with involvement of the subtalar joint. Soft tissue swelling. Electronically Signed   By: Karen Kays M.D.   On: 10/24/2022 18:32   DG Foot Complete Right  Result Date: 10/24/2022 CLINICAL DATA:  Fall EXAM: RIGHT FOOT COMPLETE - 3+ VIEW COMPARISON:  Right ankle radiographs 09/07/2012 FINDINGS: Borderline mild hallux valgus. Minimal lateral great toe metatarsophalangeal joint space narrowing. Tiny plantar calcaneal heel spur. Minimal dorsal talonavicular degenerative spurring, best seen on oblique view. Mild navicular-medial cuneiform degenerative spurring seen on oblique view. No acute fracture or dislocation. IMPRESSION: 1. No acute fracture or dislocation. 2. Borderline mild hallux valgus. 3. 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 Electronically Signed   By: Neita Garnet M.D.   On: 10/24/2022 14:52    Procedures Procedures    Medications Ordered in ED Medications  oxyCODONE (Oxy IR/ROXICODONE) immediate release tablet 5 mg (5 mg Oral Given 10/24/22 1818)    ED Course/ Medical Decision Making/ A&P Clinical Course as of 10/24/22 2335  Tue Oct 24, 2022  1725 DG Ankle Complete Right [IK]    Clinical Course User Index [IK] Augusto Gamble                                 Medical Decision Making Complains of falling and striking his right foot and ankle  Amount and/or Complexity of Data Reviewed Independent Historian:     Details: And is here with family member daughter who is supportive Radiology:  ordered and independent interpretation performed. Decision-making details documented in ED Course.    Details: X-ray of right foot was obtained at triage.  Radiologist reported no fracture.  I obtained a x-ray of patient's right ankle to further assess his injury.  He right calcaneal fracture was noted.  Discussion of management or test interpretation with external provider(s): Discussed the patient with Dr. Carola Frost orthopedist on-call.  He advised placing the patient in a bulky long-leg splint with stirrups.  Crutches he requested that we obtain a CT scan for further evaluation.  Risk Prescription drug management. Risk Details: And is given a medication here for pain.  Patient states he is unable to take Tylenol.  Patient is given oxycodone.  Patient is given a prescription for oxycodone at home but he is advised to call Dr. Magdalene Patricia office tomorrow to schedule appointment for evaluation.           Final Clinical Impression(s) /  ED Diagnoses Final diagnoses:  Closed displaced fracture of body of right calcaneus, initial encounter    Rx / DC Orders ED Discharge Orders          Ordered    oxyCODONE (ROXICODONE) 5 MG immediate release tablet  Every 4 hours PRN        10/24/22 2111          An After Visit Summary was printed and given to the patient.     Osie Cheeks 10/24/22 2339    Ernie Avena, MD 10/25/22 [phone]

## 2022-10-24 NOTE — Progress Notes (Signed)
Consult request received for left calcaneus fracture. Have discussed with the requesting MD patient's stability, pain control, and ongoing work up. I have reviewed x-rays and developed a provisional plan.   Will plan to see in office to discuss results of CT scan and pros/ cons of surgical vs nonsurgical treatment.  Myrene Galas, MD [facility], [facility] [phone]

## 2022-10-24 NOTE — Progress Notes (Signed)
Orthopedic Tech Progress Note Patient Details:  Arzell Bautista-Gutierres [birth year] [id number]  Ortho Devices Type of Ortho Device: Short leg splint, Post (short leg) splint Ortho Device/Splint Location: RLE Ortho Device/Splint Interventions: Ordered, Application, Adjustment   Post Interventions Patient Tolerated: Well Instructions Provided: Care of device, Adjustment of device  [place] A Mckinzi Eriksen 10/24/2022, 9:45 PM

## 2022-10-25 ENCOUNTER — Other Ambulatory Visit: Payer: Self-pay

## 2022-12-02 NOTE — Consult Note (Signed)
Orthopaedic Trauma Service (OTS) Consultation   Patient ID: Danny Arnold MRN: [id number] DOB/AGE: 02/15/1975 47 y.o.   Reason for Consult: left calcaneus fracture Referring Physician: Ernie Avena, MD  HPI: Danny Arnold is an 47 y.o. male who fell from height at work with acute left foot pain and inability to bear weight. Accompanied by friend here in the ED who is translating for [place]. Denies back pain or other injury. Pain is currently moderate, aching and dull, sharp and severe with motion, without associated distal tingling or numbness, and improved with narcotics.  Remote history of ETOH and has been sober for years.  Past Medical History:  Diagnosis Date   Alcoholic hepatitis    Alcoholic liver disease (HCC)    Anemia    Ascites 02/03/2013   Bacteremia     Past Surgical History:  Procedure Laterality Date   BIOPSY  04/10/2022   Procedure: BIOPSY;  Surgeon: Lanelle Bal, DO;  [facility];  Service: Endoscopy;;   COLONOSCOPY N/A 11/25/2012   [facility]   COLONOSCOPY WITH PROPOFOL N/A 04/10/2022   Procedure: COLONOSCOPY WITH PROPOFOL;  Surgeon: Lanelle Bal, DO;  [facility];  Service: Endoscopy;  Laterality: N/A;  11:00 am   ESOPHAGEAL BANDING N/A 04/08/2019   Procedure: ESOPHAGEAL BANDING;  Surgeon: West Bali, MD;  [facility];  Service: Endoscopy;  Laterality: N/A;   ESOPHAGOGASTRODUODENOSCOPY N/A 11/25/2012   GRADE 1 VARICES   ESOPHAGOGASTRODUODENOSCOPY N/A 04/20/2016   Procedure: ESOPHAGOGASTRODUODENOSCOPY (EGD);  Surgeon: West Bali, MD;  [facility];  Service: Endoscopy;  Laterality: N/A;  8:30am   ESOPHAGOGASTRODUODENOSCOPY (EGD) WITH PROPOFOL N/A 04/08/2019   Grade 1 varices, moderate gastritis and duodenitis, surveillance due in 2024.   ESOPHAGOGASTRODUODENOSCOPY (EGD) WITH PROPOFOL N/A 04/10/2022   Procedure: ESOPHAGOGASTRODUODENOSCOPY (EGD) WITH PROPOFOL;  Surgeon:  Lanelle Bal, DO;  [facility];  Service: Endoscopy;  Laterality: N/A;   HAND SURGERY Left    removal of foreign body and tendon repair.   HEMORRHOID BANDING  2014 x1   POLYPECTOMY  04/10/2022   Procedure: POLYPECTOMY;  Surgeon: Lanelle Bal, DO;  [facility];  Service: Endoscopy;;   UMBILICAL HERNIA REPAIR N/A 08/30/2015   Procedure: UMBILICAL HERNIORRHAPHY WITH MESH;  Surgeon: Franky Macho, MD;  Location: [facility];  Service: General;  Laterality: N/A;    Family History  Problem Relation Age of Onset   Migraines Mother    Colon cancer Neg Hx     Social History:  reports that he quit smoking about 10 years ago. His smoking use included cigarettes. He started smoking about 25 years ago. He has a 3.8 pack-year smoking history. He has never used smokeless tobacco. He reports that he does not drink alcohol and does not use drugs.  Allergies: No Known Allergies  Medications: Prior to Admission: (Not in a hospital admission)   No results found for this or any previous visit (from the past 48 hour(s)).  No results found.  Intake/Output    None      ROS Blood pressure (!) 138/90, pulse 74, temperature 98 F (36.7 C), temperature source Oral, resp. rate 18, height 5\' 7"  (1.702 m), weight 81.6 kg, SpO2 100%. Physical Exam Pleasant, no distress No wheezing RLE Tender heel, no wounds  Edema/ swelling moderate  Sens: DPN, SPN, TN intact  Motor: EHL, FHL, and lessor toe ext and flex all intact grossly  Brisk cap  refill, warm to touch, DP 2+   Assessment/Plan:  Minimally displaced left intraarticular calcaneus fracture  I discussed options at length with patient and his friend. We will obtain splint, elevate, ice, CT scan to define fracture, f/u this week in office to discuss further. I am hopeful can be treated nonsurgically.   PAin control Out of work for now. NWB LLE.  Myrene Galas, MD [facility],  [facility] [phone]  [facility] [address] [place] [address] [phone] [phone] (F)    After 5pm and on the weekends please log on to Amion, go to orthopaedics and the look under the [facility] Call for the provider(s) on call. You can also call our office at [phone] and then follow the prompts to be connected to the call team.

## 2023-01-04 ENCOUNTER — Other Ambulatory Visit: Payer: Self-pay

## 2023-01-04 ENCOUNTER — Ambulatory Visit: Payer: Self-pay | Attending: Internal Medicine | Admitting: Internal Medicine

## 2023-01-04 ENCOUNTER — Encounter: Payer: Self-pay | Admitting: Internal Medicine

## 2023-01-04 VITALS — BP 120/85 | HR 88 | Temp 98.4°F | Ht 67.0 in | Wt 187.0 lb

## 2023-01-04 DIAGNOSIS — G8929 Other chronic pain: Secondary | ICD-10-CM

## 2023-01-04 DIAGNOSIS — E663 Overweight: Secondary | ICD-10-CM

## 2023-01-04 DIAGNOSIS — Z23 Encounter for immunization: Secondary | ICD-10-CM

## 2023-01-04 DIAGNOSIS — K2960 Other gastritis without bleeding: Secondary | ICD-10-CM

## 2023-01-04 DIAGNOSIS — M25522 Pain in left elbow: Secondary | ICD-10-CM

## 2023-01-04 DIAGNOSIS — K7030 Alcoholic cirrhosis of liver without ascites: Secondary | ICD-10-CM

## 2023-01-04 DIAGNOSIS — R030 Elevated blood-pressure reading, without diagnosis of hypertension: Secondary | ICD-10-CM

## 2023-01-04 DIAGNOSIS — M25521 Pain in right elbow: Secondary | ICD-10-CM

## 2023-01-04 MED ORDER — DICLOFENAC SODIUM 1 % EX GEL
2.0000 g | Freq: Four times a day (QID) | CUTANEOUS | 2 refills | Status: DC
  Filled 2023-01-04: qty 100, 13d supply, fill #0
  Filled 2023-06-05: qty 100, 13d supply, fill #1
  Filled 2023-07-04: qty 100, 13d supply, fill #2

## 2023-01-04 MED ORDER — OMEPRAZOLE 20 MG PO CPDR
20.0000 mg | DELAYED_RELEASE_CAPSULE | Freq: Every day | ORAL | 2 refills | Status: DC
  Filled 2023-01-04: qty 90, 90d supply, fill #0
  Filled 2023-04-09: qty 90, 90d supply, fill #1
  Filled 2023-07-04: qty 90, 90d supply, fill #2

## 2023-01-04 NOTE — Patient Instructions (Signed)
Plan de alimentacin DASH DASH Eating Plan DASH es la sigla en ingls de "Enfoques alimentarios para detener la hipertensin" (Dietary Approaches to Stop Hypertension). El plan de alimentacin DASH ha demostrado: Bajar la presin arterial alta (hipertensin). Reducir el riesgo de diabetes tipo 2, enfermedad cardaca y accidente cerebrovascular. Ayudar a perder peso. Consejos para seguir este plan Leer las etiquetas de los alimentos Verifique la cantidad de sal (sodio) por porcin en las etiquetas de los alimentos. Elija alimentos con menos del 5 por ciento del valor diario (VD) de sodio. En general, los alimentos con menos de 300 miligramos (mg) de sodio por porcin se encuadran dentro de este plan alimentario. Para encontrar cereales integrales, busque la palabra "integral" como primera palabra en la lista de ingredientes. Al ir de compras Compre productos en los que en su etiqueta diga: "bajo contenido de sodio" o "sin sal agregada". Compre alimentos frescos. Evite los alimentos enlatados y comidas precocidas o congeladas. Al cocinar Trate de no agregar sal cuando cocine. Use hierbas o aderezos sin sal, en lugar de sal de mesa o sal marina. Consulte al mdico o farmacutico antes de usar sustitutos de la sal. No fra los alimentos. A la hora de cocinar los alimentos, opte por hornearlos, hervirlos, grillarlos, asarlos al horno o a la parrilla. Cocine con aceites que sean buenos para el corazn. Estos incluyen el aceite de oliva, canola, aguacate, soja y girasol. Planificacin de las comidas  Siga una dieta equilibrada. Esto debe incluir lo siguiente: 4 o ms porciones de frutas y 4 o ms porciones de verduras por da. Trate de que medio plato de cada comida sea de frutas y verduras. De 6 a 8 porciones de cereales integrales todos los das. 6 o menos porciones de carne magra, ave o pescado por da. 1 onza es 1 porcin. Una porcin de 3 onzas (85 g) de carne tiene casi el mismo tamao que la  palma de la mano. Un huevo es 1 onza (28 g). De 2 a 3 porciones de productos lcteos descremados por da. Una porcin es 1 taza (237 ml). 1 porcin de frutos secos, semillas o frijoles 5 veces por semana. De 2 a 3 porciones de grasas cardiosaludables. Las grasas saludables llamadas cidos grasos omega-3 se encuentran en alimentos como las nueces, las semillas de lino, las leches fortificadas y los huevos. Estas grasas tambin se encuentran en los pescados de agua fra, como la sardina, el salmn y la caballa. Limite la cantidad que consume de: Alimentos enlatados o envasados. Alimentos con alto contenido de grasa trans, como alimentos fritos. Alimentos con alto contenido de grasa saturada, como carne con grasa. Postres y otros dulces, bebidas azucaradas y otros alimentos con azcar agregada. Productos lcteos enteros. No le agregue sal a los alimentos antes de probarlos. No coma ms de 4 yemas de huevo por semana. Trate de comer al menos 2 comidas vegetarianas por semana. Consuma ms comida casera y menos de restaurante, de bares y comida rpida. Estilo de vida Cuando coma en un restaurante, pida que preparen su comida con menos sal, o bien, sin nada de sal. Si bebe alcohol: Limite la cantidad que bebe a lo siguiente: De 0 a 1 medida al da si es mujer. De 0 a 2 medidas al da si es varn. Sepa cunta cantidad de alcohol hay en las bebidas que toma. En los [place], una medida es una botella de cerveza de 12 oz (355 ml), un vaso de vino de 5 oz (148 ml) o   un vaso de una bebida alcohlica de alta graduacin de 1 oz (44 ml). Informacin general Evite ingerir ms de 2,300 mg de sal por da. Si tiene hipertensin, es posible que necesite reducir la ingesta de sodio a 1,500 mg por da. Trabaje con el mdico para mantenerse en un peso saludable o para perder peso. Pregunte cul es el rango de peso recomendable para usted. La mayora de los das de la semana, realice al menos 30 minutos de  ejercicio que haga que se acelere su corazn. Estos pueden incluir caminar, nadar o andar en bicicleta. Trabaje con su mdico o nutricionista para ajustar su plan alimentario a sus necesidades calricas especficas. Qu alimentos debo comer? Frutas Todas las frutas frescas, congeladas o secas. Frutas enlatadas que estn en su jugo natural y que no tengan azcar agregada. Verduras Verduras frescas o congeladas crudas, cocidas al vapor, asadas o grilladas. Jugos de tomate y verduras con bajo contenido de sodio o reducidos en sodio. Salsa y pasta de tomate con bajo contenido de sodio o reducidas en sodio. Verduras enlatadas con bajo contenido de sodio o reducidas en sodio. Granos Pan de salvado o integral. Pasta de salvado o integral. Arroz integral. Avena. Quinua. Trigo burgol. Cereales integrales y con bajo contenido de sodio. Pan pita. Galletitas de agua con bajo contenido de grasa y sodio. Tortillas de harina integral. Carnes y otras protenas Pollo o pavo sin piel. Carne de pollo o de pavo molida. Cerdo desgrasado. Pescado y mariscos. Claras de huevo. Porotos, guisantes o lentejas secos. Frutos secos, mantequilla de frutos secos y semillas sin sal. Frijoles enlatados sin sal. Cortes de carne vacuna magra, desgrasada. Carne precocida o curada magra y baja en sodio, como embutidos o panes de carne. Lcteos Leche descremada (1 %) o descremada (desnatada). Quesos reducidos en grasa, con bajo contenido de grasa o descremados. Queso blanco o ricota sin grasa, con bajo contenido de sodio. Yogur semidescremado o descremado. Queso con bajo contenido de grasa y sodio. Grasas y aceites Margarinas untables que no contengan grasas trans. Aceite vegetal. Mayonesa y aderezos para ensaladas livianos, reducidos en grasa o con bajo contenido de grasas (reducidos en sodio). Aceite de canola, crtamo, oliva, aguacate, soja y girasol. Aguacate. Alios y condimentos Hierbas. Especias. Mezclas de condimentos sin  sal. Otros alimentos Palomitas de maz y pretzels sin sal. Dulces con bajo contenido de grasas. Es posible que los productos que se enumeran ms arriba no sean todos los alimentos y las bebidas que puede consumir. Consulte a un nutricionista para obtener ms informacin. Qu alimentos debo evitar? Frutas Fruta enlatada en almbar liviano o espeso. Frutas cocidas en aceite. Frutas con salsa de crema o mantequilla. Verduras Verduras con crema o fritas. Verduras en salsa de queso. Verduras enlatadas regulares que no sean con bajo contenido de sodio o reducidas en sodio. Pasta y salsa de tomates enlatadas regulares que no sean con bajo contenido de sodio o reducidas en sodio. Jugos de tomate y de verduras regulares que no sean con bajo contenido de sodio o reducidos en sodio. Pepinillos. Aceitunas. Granos Productos de panificacin hechos con grasa, como medialunas, magdalenas y algunos panes. Comidas con arroz o pasta seca listas para usar. Carnes y otras protenas Cortes de carne con alto contenido de grasa. Costillas. Carne frita. Tocino. Mortadela, salame y otras carnes precocidas o curadas, como embutidos o panes de carne, que no sean magros y que no sean con bajo contenido de sodio. Grasa de la espalda del cerdo (panceta). Salchicha de cerdo. Frutos secos   y semillas con sal. Frijoles enlatados con sal agregada. Pescado enlatado o ahumado. Huevos enteros o yemas. Pollo o pavo con piel. Lcteos Leche entera o al 2 %, crema y mitad leche y mitad crema. Queso crema entero o con toda su grasa. Yogur entero o endulzado. Quesos con toda su grasa. Sustitutos de cremas no lcteas. Coberturas batidas. Quesos para untar y quesos procesados. Grasas y aceites Mantequilla. Margarina en barra. Manteca de cerdo. Materia grasa. Mantequilla clarificada. Grasa de tocino. Aceites tropicales como aceite de coco, palmiste o palma. Alios y condimentos Sal de cebolla, sal de ajo, sal condimentada, sal de mesa y sal  marina. Salsa [place]. Salsa trtara. Salsa barbacoa. Salsa teriyaki. Salsa de soja, incluso la que tiene contenido reducido de sodio. Salsa de carne. Salsas en lata y envasadas. Salsa de pescado. Salsa de ostras. Salsa rosada. Rbanos picantes comprados en tiendas. [place]up. Mostaza. Saborizantes y tiernizantes para carne. Caldo en cubitos. Salsas picantes. Adobos preelaborados o envasados. Aderezos para tacos preelaborados o envasados. Salsas. Aderezos comunes para ensalada. Otros alimentos Palomitas de maz y pretzels con sal. Es posible que los productos que se enumeran ms arriba no sean todos los alimentos y las bebidas que debe evitar. Consulte a un nutricionista para obtener ms informacin. Dnde obtener ms informacin National Heart, Lung, and Blood Institute (Instituto Nacional del Corazn, los Pulmones y la Sangre, NHLBI): nhlbi.nih.gov American Heart Association (Asociacin Estadounidense del Corazn, AHA): heart.org Academy of Nutrition and Dietetics (Academia de Nutricin y Diettica): eatright.org National Kidney Foundation (NKF) (Fundacin Nacional del Rin): kidney.org Esta informacin no tiene como fin reemplazar el consejo del mdico. Asegrese de hacerle al mdico cualquier pregunta que tenga. Document Revised: 03/10/2022 Document Reviewed: 03/10/2022 Elsevier Patient Education  2024 Elsevier Inc.  

## 2023-01-04 NOTE — Progress Notes (Signed)
Flu vaccine administered in right deltoid per protocols.  Information sheet given. Patient denies and pain or discomfort at injection site. Tolerated injection well no reaction.  

## 2023-01-04 NOTE — Progress Notes (Signed)
Patient ID: Danny Arnold, male    DOB: [birth year]  MRN: [id number]  CC: Follow-up (Follow-up. Med refill. /Pt is due for 6 mo liver US - requesting for order to be put in/Yes to flu vax.)   Subjective: Danny Arnold is a 47 y.o. male who presents for chronic ds management. His concerns today include:  Hx of ETOH cirrhosis, esophageal varices followed by GI   AMN Language interpreter used during this encounter. #[id number], Luis  EtOH cirrhosis.  Last EGD was 03/2022.  No varices seen. Due for recheck of LFTs, also thinks he is due for repeat US liver.  Last checked in February of this year. Free of ETOH 11-12 yrs Last saw GI 03/2022.  He was suppose to f/u in 6 mths Feels he does okay with eating habits.    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   Arthritis of elbow, right 07/07/2021   Radial tunnel syndrome, right 12/22/2020   Influenza vaccine needed 04/12/2020   Patellofemoral pain syndrome of both knees 08/05/2019   Lateral epicondylitis of both elbows 08/05/2019   Constipation 12/21/2017   Elevated blood pressure reading 12/04/2017   History of alcohol abuse 06/19/2017   Gastritis and gastroduodenitis    Umbilical hernia 11/12/2013   Alcoholic hepatitis 11/24/2012   Cirrhosis, alcoholic (HCC) 10/28/2012     Current Outpatient Medications on File Prior to Visit  Medication Sig Dispense Refill   diclofenac Sodium (VOLTAREN) 1 % GEL Apply 2 g topically 4 (four) times daily. 100 g 2   omeprazole (PRILOSEC) 20 MG capsule Take 1 capsule (20 mg total) by mouth daily before 30 minutes before breakfast. 90 capsule 2   Cyanocobalamin (B-12 PO) Take 1 tablet by mouth daily. (Patient not taking: Reported on 01/04/2023)     oxyCODONE (ROXICODONE) 5 MG immediate release tablet Take 1 tablet (5 mg total) by mouth every 4 (four) hours as needed for severe pain. (Patient not taking: Reported on 01/04/2023) 30  tablet 0   No current facility-administered medications on file prior to visit.    No Known Allergies  Social History   Socioeconomic History   Marital status: Single    Spouse name: Not on file   Number of children: Not on file   Years of education: Not on file   Highest education level: Not on file  Occupational History   Not on file  Tobacco Use   Smoking status: Former    Current packs/day: 0.00    Average packs/day: 0.3 packs/day for 15.0 years (3.8 ttl pk-yrs)    Types: Cigarettes    Start date: 04/03/1997    Quit date: 04/03/2012    Years since quitting: 10.7   Smokeless tobacco: Never   Tobacco comments:    Quit smoking more than a year ago  Vaping Use   Vaping status: Never Used  Substance and Sexual Activity   Alcohol use: No    Comment: As of 03/31/21: heavy etoh until 02/2012- none since 2014.   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   Social Determinants of Health   Financial Resource Strain: Not on file  Food Insecurity: Not on file  Transportation Needs: Not on file  Physical Activity: Not on file  Stress: Not on file  Social Connections: Not  on file  Intimate Partner Violence: Not on file    Family History  Problem Relation Age of Onset   Migraines Mother    Colon cancer Neg Hx     Past Surgical History:  Procedure Laterality Date   BIOPSY  04/10/2022   Procedure: BIOPSY;  Surgeon: Lanelle Bal, DO;  [facility];  Service: Endoscopy;;   COLONOSCOPY N/A 11/25/2012   [facility]   COLONOSCOPY WITH PROPOFOL N/A 04/10/2022   Procedure: COLONOSCOPY WITH PROPOFOL;  Surgeon: Lanelle Bal, DO;  [facility];  Service: Endoscopy;  Laterality: N/A;  11:00 am   ESOPHAGEAL BANDING N/A 04/08/2019   Procedure: ESOPHAGEAL BANDING;  Surgeon: West Bali, MD;  [facility];  Service: Endoscopy;  Laterality:  N/A;   ESOPHAGOGASTRODUODENOSCOPY N/A 11/25/2012   GRADE 1 VARICES   ESOPHAGOGASTRODUODENOSCOPY N/A 04/20/2016   Procedure: ESOPHAGOGASTRODUODENOSCOPY (EGD);  Surgeon: West Bali, MD;  [facility];  Service: Endoscopy;  Laterality: N/A;  8:30am   ESOPHAGOGASTRODUODENOSCOPY (EGD) WITH PROPOFOL N/A 04/08/2019   Grade 1 varices, moderate gastritis and duodenitis, surveillance due in 2024.   ESOPHAGOGASTRODUODENOSCOPY (EGD) WITH PROPOFOL N/A 04/10/2022   Procedure: ESOPHAGOGASTRODUODENOSCOPY (EGD) WITH PROPOFOL;  Surgeon: Lanelle Bal, DO;  [facility];  Service: Endoscopy;  Laterality: N/A;   HAND SURGERY Left    removal of foreign body and tendon repair.   HEMORRHOID BANDING  2014 x1   POLYPECTOMY  04/10/2022   Procedure: POLYPECTOMY;  Surgeon: Lanelle Bal, DO;  [facility];  Service: Endoscopy;;   UMBILICAL HERNIA REPAIR N/A 08/30/2015   Procedure: UMBILICAL HERNIORRHAPHY WITH MESH;  Surgeon: Franky Macho, MD;  Location: [facility];  Service: General;  Laterality: N/A;    ROS: Review of Systems Negative except as stated above  PHYSICAL EXAM: BP 120/85 (BP Location: Left Arm, Patient Position: Sitting, Cuff Size: Normal)   Pulse 88   Temp 98.4 F (36.9 C) (Oral)   Ht 5\' 7"  (1.702 m)   Wt 187 lb (84.8 kg)   SpO2 96%   BMI 29.29 kg/m   Physical Exam Repeat 133/95  General appearance - alert, well appearing, middle age Hispanic male and in no distress Mental status - normal mood, behavior, speech, dress, motor activity, and thought processes Chest - clear to auscultation, no wheezes, rales or rhonchi, symmetric air entry Heart - normal rate, regular rhythm, normal S1, S2, no murmurs, rubs, clicks or gallops Abdomen - soft, nontender, nondistended, no masses or organomegaly Extremities - peripheral pulses normal, no pedal edema, no clubbing or cyanosis     Latest Ref Rng & Units 03/21/2022   10:56 AM 09/28/2021   10:58 AM 04/22/2021     9:36 AM  CMP  Glucose 70 - 99 mg/dL 409  811  94   BUN 6 - 20 mg/dL 13  18  14    Creatinine 0.61 - 1.24 mg/dL 9.14  7.82  9.56   Sodium 135 - 145 mmol/L 135  138  137   Potassium 3.5 - 5.1 mmol/L 3.7  3.8  3.9   Chloride 98 - 111 mmol/L 105  109  103   CO2 22 - 32 mmol/L 22  24  25    Calcium 8.9 - 10.3 mg/dL 8.8  8.9  9.3   Total Protein 6.5 - 8.1 g/dL 7.7  7.6  7.9   Total Bilirubin 0.3 - 1.2 mg/dL 0.8  0.7  1.2   Alkaline Phos 38 -  126 U/L 68  67  62   AST 15 - 41 U/L 24  21  22    ALT 0 - 44 U/L 28  25  25     Lipid Panel  No results found for: "CHOL", "TRIG", "HDL", "CHOLHDL", "VLDL", "LDLCALC", "LDLDIRECT"  CBC    Component Value Date/Time   WBC 8.3 03/21/2022 1056   RBC 5.87 (H) 03/21/2022 1056   HGB 17.7 (H) 03/21/2022 1056   HCT 51.5 03/21/2022 1056   PLT 243 03/21/2022 1056   MCV 87.7 03/21/2022 1056   MCH 30.2 03/21/2022 1056   MCHC 34.4 03/21/2022 1056   RDW 12.6 03/21/2022 1056   LYMPHSABS 1.9 03/21/2022 1056   MONOABS 0.7 03/21/2022 1056   EOSABS 0.2 03/21/2022 1056   BASOSABS 0.0 03/21/2022 1056    ASSESSMENT AND PLAN: 1. Alcoholic cirrhosis of liver without ascites (HCC) Stable.  Not due for repeat liver ultrasound not yet. - CBC - Comprehensive metabolic panel - Ambulatory referral to Gastroenterology  2. Elevated blood pressure reading in office without diagnosis of hypertension DASH diet discussed and encouraged.  Printed information given.  Will bring him back in with clinical pharmacist in 4 weeks for recheck  3. Overweight (BMI 25.0-29.9) Patient advised to eliminate sugary drinks from the diet, cut back on portion sizes especially of white carbohydrates, eat more white lean meat like chicken [place] and seafood instead of beef or pork and incorporate fresh fruits and vegetables into the diet daily. Encouraged him to get in about 30 minutes of moderate intensity exercise 3 to 5 days a week. - Lipid panel  4. Need for influenza vaccination Given  today.   Patient was given the opportunity to ask questions.  Patient verbalized understanding of the plan and was able to repeat key elements of the plan.   This documentation was completed using Paediatric nurse.  Any transcriptional errors are unintentional.  Orders Placed This Encounter  Procedures   CBC   Comprehensive metabolic panel   Lipid panel   Ambulatory referral to Gastroenterology     Requested Prescriptions    No prescriptions requested or ordered in this encounter    Return in about 6 months (around 07/04/2023) for Appt with [facility] in 4 wks for BP check.  Jonah Blue, MD, FACP

## 2023-01-05 ENCOUNTER — Other Ambulatory Visit: Payer: Self-pay

## 2023-01-05 LAB — COMPREHENSIVE METABOLIC PANEL
ALT: 65 [IU]/L — ABNORMAL HIGH (ref 0–44)
AST: 45 [IU]/L — ABNORMAL HIGH (ref 0–40)
Albumin: 4.3 g/dL (ref 4.1–5.1)
Alkaline Phosphatase: 117 [IU]/L (ref 44–121)
BUN/Creatinine Ratio: 9 (ref 9–20)
BUN: 11 mg/dL (ref 6–24)
Bilirubin Total: 0.7 mg/dL (ref 0.0–1.2)
CO2: 19 mmol/L — ABNORMAL LOW (ref 20–29)
Calcium: 9.6 mg/dL (ref 8.7–10.2)
Chloride: 106 mmol/L (ref 96–106)
Creatinine, Ser: 1.25 mg/dL (ref 0.76–1.27)
Globulin, Total: 3.1 g/dL (ref 1.5–4.5)
Glucose: 90 mg/dL (ref 70–99)
Potassium: 4.1 mmol/L (ref 3.5–5.2)
Sodium: 140 mmol/L (ref 134–144)
Total Protein: 7.4 g/dL (ref 6.0–8.5)
eGFR: 72 mL/min/{1.73_m2} (ref >59)

## 2023-01-05 LAB — LIPID PANEL
Chol/HDL Ratio: 2.6 ratio (ref 0.0–5.0)
Cholesterol, Total: 174 mg/dL (ref 100–199)
HDL: 68 mg/dL (ref >39)
LDL Chol Calc (NIH): 87 mg/dL (ref 0–99)
Triglycerides: 109 mg/dL (ref 0–149)
VLDL Cholesterol Cal: 19 mg/dL (ref 5–40)

## 2023-01-05 LAB — CBC
Hematocrit: 49.7 % (ref 37.5–51.0)
Hemoglobin: 16.8 g/dL (ref 13.0–17.7)
MCH: 29.5 pg (ref 26.6–33.0)
MCHC: 33.8 g/dL (ref 31.5–35.7)
MCV: 87 fL (ref 79–97)
Platelets: 266 10*3/uL (ref 150–450)
RBC: 5.69 x10E6/uL (ref 4.14–5.80)
RDW: 12.3 % (ref 11.6–15.4)
WBC: 8.8 10*3/uL (ref 3.4–10.8)

## 2023-01-08 ENCOUNTER — Telehealth: Payer: Self-pay

## 2023-01-08 NOTE — Telephone Encounter (Signed)
-----   Message from [facility] Jamese Trauger M sent at 01/08/2023  8:39 AM EST -----  ----- Message ----- From: Marcine Matar, MD Sent: 01/05/2023   8:47 PM EST To: Johna Roles, CMA  Let patient know that his kidney function is good.  He has mild elevation in 2 of his liver function tests.  Make sure that he remains free of alcohol use.  Blood cell counts are normal.  Cholesterol levels are good.

## 2023-01-08 NOTE — Telephone Encounter (Signed)
-----   Message from [facility] Keghan Mcfarren M sent at 01/08/2023  8:39 AM EST -----  ----- Message ----- From: Marcine Matar, MD Sent: 01/05/2023   8:47 PM EST To: Johna Roles, CMA  Let patient know that his kidney function is good.  He has mild elevation in 2 of his liver function tests.  Make sure that he remains free of alcohol use.  Blood cell counts are normal.  Cholesterol levels are good.

## 2023-01-08 NOTE — Telephone Encounter (Signed)
Pt was called and is aware of results, DOB was confirmed.   Interpreter id # [phone]

## 2023-01-08 NOTE — Telephone Encounter (Signed)
FYI Patient states he is not drinking any alcohol

## 2023-02-01 ENCOUNTER — Encounter: Payer: Self-pay | Admitting: Pharmacist

## 2023-02-01 ENCOUNTER — Ambulatory Visit: Payer: Self-pay | Attending: Internal Medicine | Admitting: Pharmacist

## 2023-02-01 VITALS — BP 126/82

## 2023-02-01 DIAGNOSIS — R030 Elevated blood-pressure reading, without diagnosis of hypertension: Secondary | ICD-10-CM

## 2023-02-01 NOTE — Progress Notes (Signed)
Referring Provider: Marcine Matar, MD Primary Care Physician:  Marcine Matar, MD Primary GI Physician: Dr. Marletta Lor  Chief Complaint  Patient presents with   Follow-up    Follow up. No problems     HPI:   Danny Arnold is a 47 y.o. male with history of alcoholic cirrhosis, grade 1 esophageal varices in the past, liver hemangioma, presenting today for follow-up.   Last seen in the office 03/21/2022.  He had no significant GI symptoms or signs/symptoms of decompensating liver disease.  He was scheduled for surveillance EGD, colonoscopy, RUQ ultrasound, labs, and advised to follow-up in 6 months.  Labs were completed on 03/21/2022 corresponding to MELD 3.0 of 8.  RUQ ultrasound 03/30/2022 with diffuse increased echotexture of the liver.  No focal liver lesion.  EGD 04/10/2022: Gastritis biopsied, otherwise normal exam.  Recommended daily PPI and repeat EGD in 3 years for surveillance.  Pathology showed gastric antral and oxyntic mucosa with chronic gastritis.  No H. pylori.  Colonoscopy 04/10/2022: Nonbleeding internal hemorrhoids, 4 mm polyp in the ascending colon resected and retrieved, 8 mm polyp in the rectosigmoid colon resected and retrieved.  Pathology with tubular adenomas.  Recommended 5-year surveillance.   Today: Labs: 01/04/2023-AST 45, ALT 65, otherwise CBC and CMP within normal limits.  MELD 3.0: Need to update INR to calculate. [place]: Currently overdue. AFP: Overdue. Hep A/B vaccination: Immune EGD: Up-to-date.  Surveillance due February 2027.  No varices on last EGD in February 2024 though history of grade 1 esophageal varices in the past. BB: No Ascites/peripheral edema: No Diuretics: None Paracentesis: Never History of SBP: No Encephalopathy:  Patient denies mental status changes, confusion, disorientation.  Previous evaluation with negative ANA, AMA.  No alpha-1 antitrypsin deficiency.  Ceruloplasmin 41.  Ferritin previously mildly elevated at  414 in 2014, but normal in 2022.  Immune to hep A and B.  Hep C antibody and viral RNA negative in 2014.  ETOH: None. Last alcohol use 2014.  Does drink an herbal tea 1 every 3 days. Supposedly it is supposed to maintain liver/clean out the body.  No tylenol, or NSAIDs.   No drug use.   BP has been high. PCP hoping to correct with diet. Can't eat anything with salt, nothing in a can.   No other GI concerns to me.  GERD is controlled on omeprazole daily.  No nausea, vomiting, dysphagia, abdominal pain.  Denies constipation, diarrhea,.  BPR, melena.  Past Medical History:  Diagnosis Date   Alcoholic hepatitis    Alcoholic liver disease (HCC)    Anemia    Ascites 02/03/2013   Bacteremia     Past Surgical History:  Procedure Laterality Date   BIOPSY  04/10/2022   Procedure: BIOPSY;  Surgeon: Lanelle Bal, DO;  [facility];  Service: Endoscopy;;   COLONOSCOPY N/A 11/25/2012   IH   COLONOSCOPY WITH PROPOFOL N/A 04/10/2022   Procedure: COLONOSCOPY WITH PROPOFOL;  Surgeon: Lanelle Bal, DO;  [facility];  Service: Endoscopy;  Laterality: N/A;  11:00 am   ESOPHAGEAL BANDING N/A 04/08/2019   Procedure: ESOPHAGEAL BANDING;  Surgeon: West Bali, MD;  [facility];  Service: Endoscopy;  Laterality: N/A;   ESOPHAGOGASTRODUODENOSCOPY N/A 11/25/2012   GRADE 1 VARICES   ESOPHAGOGASTRODUODENOSCOPY N/A 04/20/2016   Procedure: ESOPHAGOGASTRODUODENOSCOPY (EGD);  Surgeon: West Bali, MD;  [facility];  Service: Endoscopy;  Laterality: N/A;  8:30am   ESOPHAGOGASTRODUODENOSCOPY (EGD) WITH PROPOFOL N/A  04/08/2019   Grade 1 varices, moderate gastritis and duodenitis, surveillance due in 2024.   ESOPHAGOGASTRODUODENOSCOPY (EGD) WITH PROPOFOL N/A 04/10/2022   Procedure: ESOPHAGOGASTRODUODENOSCOPY (EGD) WITH PROPOFOL;  Surgeon: Lanelle Bal, DO;  [facility];  Service: Endoscopy;  Laterality: N/A;   HAND SURGERY Left    removal  of foreign body and tendon repair.   HEMORRHOID BANDING  2014 x1   POLYPECTOMY  04/10/2022   Procedure: POLYPECTOMY;  Surgeon: Lanelle Bal, DO;  [facility];  Service: Endoscopy;;   UMBILICAL HERNIA REPAIR N/A 08/30/2015   Procedure: UMBILICAL HERNIORRHAPHY WITH MESH;  Surgeon: Franky Macho, MD;  Location: [facility];  Service: General;  Laterality: N/A;    Current Outpatient Medications  Medication Sig Dispense Refill   diclofenac Sodium (VOLTAREN) 1 % GEL Apply 2 g topically 4 (four) times daily. 100 g 2   omeprazole (PRILOSEC) 20 MG capsule Take 1 capsule (20 mg total) by mouth daily before 30 minutes before breakfast. 90 capsule 2   No current facility-administered medications for this visit.    Allergies as of 02/02/2023   (No Known Allergies)    Family History  Problem Relation Age of Onset   Migraines Mother    Colon cancer Neg Hx     Social History   Socioeconomic History   Marital status: Single    Spouse name: Not on file   Number of children: Not on file   Years of education: Not on file   Highest education level: Not on file  Occupational History   Not on file  Tobacco Use   Smoking status: Former    Current packs/day: 0.00    Average packs/day: 0.3 packs/day for 15.0 years (3.8 ttl pk-yrs)    Types: Cigarettes    Start date: 04/03/1997    Quit date: 04/03/2012    Years since quitting: 10.8   Smokeless tobacco: Never   Tobacco comments:    Quit smoking more than a year ago  Vaping Use   Vaping status: Never Used  Substance and Sexual Activity   Alcohol use: No    Comment: As of 03/31/21: heavy etoh until 02/2012- none since 2014.   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   Social Drivers of Corporate investment banker Strain: Not on file  Food Insecurity: Not on file  Transportation Needs: Not on file  Physical Activity: Not  on file  Stress: Not on file  Social Connections: Not on file    Review of Systems: Gen: Denies fever, chills, cold or flulike symptoms, presyncope, syncope. CV: Denies chest pain, palpitations. Resp: Denies dyspnea, cough. GI: See HPI Heme: See HPI  Physical Exam: BP 138/83 (BP Location: Right Arm, Patient Position: Sitting, Cuff Size: Large)   Pulse 67   Temp 97.7 F (36.5 C) (Temporal)   Ht 5\' 8"  (1.727 m)   Wt 189 lb 12.8 oz (86.1 kg)   BMI 28.86 kg/m  General:   Alert and oriented. No distress noted. Pleasant and cooperative.  Head:  Normocephalic and atraumatic. Eyes:  Conjuctiva clear without scleral icterus. Heart:  S1, S2 present without murmurs appreciated. Lungs:  Clear to auscultation bilaterally. No wheezes, rales, or rhonchi. No distress.  Abdomen:  +BS, soft, non-tender and non-distended. No rebound or guarding. No HSM or masses noted. Msk:  Symmetrical without gross deformities. Normal posture. Extremities:  Without edema. Neurologic:  Alert and  oriented x4 Psych:  Normal mood and affect.    Assessment:  47 y.o. male with history of alcoholic cirrhosis, grade 1 esophageal varices in the past, liver hemangioma, presenting today for follow-up.   Alcoholic cirrhosis: Clinically, remaining well compensated.  MELD 3.0 was 8 in February 2024.  Recent labs on file with mild bump in liver enzymes, AST 45, ALT 65, otherwise labs look good.  Will need to update INR to recalculate MELD.  Denies alcohol since 2014.  He has been drinking an herbal tea which may be contributing to mildly elevated LFTs; however, will check ASMA, immunoglobulins as these is were left off of his prior evaluation.  I will also repeat iron panel as he has had history of elevated ferritin.  He is immune to hepatitis A and B.  Prior Hep C antibody and RNA negative in 2014.  Additional workup in the past has shown negative ANA, AMA, no alpha-1 antitrypsin deficiency, and ceruloplasmin of 41.  He  is due for routine HCC screening with RUQ ultrasound and AFP.  We will get this arranged.  EGD is up-to-date in February 2024 with no evidence of esophageal varices at that time.    Plan:  RUQ ultrasound HFP, INR, AFP, ASMA, immunoglobulins, iron panel Avoid over-the-counter supplements/herbal teas. Nutrition Recommendations:  High-protein diet from a primarily plant-based diet. Avoid red meat.  No raw or undercooked meat, seafood, or shellfish. Low-fat/cholesterol/carbohydrate diet. Limit sodium to no more than 2000 mg/day including everything that you eat and drink. Recommend at least 30 minutes of aerobic and resistance exercise 3 days/week. Continue to avoid alcohol. Follow-up in 6 months or sooner if needed.   Ermalinda Memos, PA-C [facility] 02/02/2023

## 2023-02-01 NOTE — Progress Notes (Signed)
S:     No chief complaint on file.  47 y.o. male who presents for hypertension evaluation, education, and management.  PMH is significant for elevated blood pressure without the dx of HTN, alcoholic cirrhosis, R radial tunnel syndrome, bilateral chronic knee & elbow pain. Patient was referred and last seen by Primary Care Provider, Dr. Laural Benes, on 01/04/2023. BP was 120/85 at that visit.   Today, patient arrives in good spirits and presents without assistance. Denies dizziness, headache, blurred vision, swelling.   Patient reports hypertension has not been diagnosed. Chart confirms this.   Family/Social history:  Fhx: no pertinent positives  Tobacco: former smoker (quit in 2014) Alcohol: former alcohol abuse. None since 2014.  Medication adherence: does not currently take antihypertensives.   Reported home BP readings:  -None  Patient reported dietary habits:  -Compliant with sodium restriction -Denies drinking caffeine   Patient-reported exercise habits:  -Stationary bike, walking, lifts weights -3 hours every 2-3 days during the week  O:  Today's Vitals   02/01/23 1411  BP: 126/82   There is no height or weight on file to calculate BMI.   Last 3 Office BP readings: BP Readings from Last 3 Encounters:  02/01/23 126/82  01/04/23 120/85  10/24/22 (!) 138/90    BMET    Component Value Date/Time   NA 140 01/04/2023 1419   K 4.1 01/04/2023 1419   CL 106 01/04/2023 1419   CO2 19 (L) 01/04/2023 1419   GLUCOSE 90 01/04/2023 1419   GLUCOSE 106 (H) 03/21/2022 1056   BUN 11 01/04/2023 1419   CREATININE 1.25 01/04/2023 1419   CREATININE 1.02 08/12/2015 1516   CALCIUM 9.6 01/04/2023 1419   GFRNONAA >60 03/21/2022 1056   GFRNONAA >89 08/12/2015 1516   GFRAA >60 09/24/2019 1609   GFRAA >89 08/12/2015 1516    Renal function: CrCl cannot be calculated (Patient's most recent lab result is older than the maximum 21 days allowed.).  Clinical ASCVD: No  The 10-year  ASCVD risk score (Arnett DK, et al., 2019) is: 1.1%   Values used to calculate the score:     Age: 31 years     Sex: Male     Is Non-Hispanic African American: No     Diabetic: No     Tobacco smoker: No     Systolic Blood Pressure: 126 mmHg     Is BP treated: No     HDL Cholesterol: 68 mg/dL     Total Cholesterol: 174 mg/dL  Patient is participating in a Managed Medicaid Plan: No    A/P: Hypertension undiagnosed. BP goal in this setting should be normotension. He has no compelling indication and his ASCVD risk is low. BP today is 126/82 mmHg. Technically, you could classify this as Stage I HTN. His ASCVD risk is < 10%, so we will continue to monitor but can hold off on medication at this time.  -Continued without medication for now.  -Counseled on lifestyle modifications for blood pressure control including reduced dietary sodium, increased exercise, adequate sleep. -Encouraged patient to check BP at home and bring log of readings to next visit. Counseled on proper use of home BP cuff.   Results reviewed and written information provided.    Written patient instructions provided. Patient verbalized understanding of treatment plan.  Total time in face to face counseling 15 minutes.    Follow-up:  Pharmacist in 1 month.  Butch Penny, PharmD, [place]y [place], CPP Clinical Pharmacist [facility] & [facility] [phone]

## 2023-02-02 ENCOUNTER — Other Ambulatory Visit (HOSPITAL_COMMUNITY)
Admission: RE | Admit: 2023-02-02 | Discharge: 2023-02-02 | Disposition: A | Discharge status: Home / Self Care | Payer: Self-pay | Source: Ambulatory Visit | Attending: Gastroenterology | Admitting: Gastroenterology

## 2023-02-02 ENCOUNTER — Encounter: Payer: Self-pay | Admitting: *Deleted

## 2023-02-02 ENCOUNTER — Encounter: Payer: Self-pay | Admitting: Gastroenterology

## 2023-02-02 ENCOUNTER — Ambulatory Visit: Payer: Self-pay | Admitting: Gastroenterology

## 2023-02-02 VITALS — BP 138/83 | HR 67 | Temp 97.7°F | Ht 68.0 in | Wt 189.8 lb

## 2023-02-02 DIAGNOSIS — K7030 Alcoholic cirrhosis of liver without ascites: Secondary | ICD-10-CM

## 2023-02-02 DIAGNOSIS — R7989 Other specified abnormal findings of blood chemistry: Secondary | ICD-10-CM

## 2023-02-02 LAB — HEPATIC FUNCTION PANEL
ALT: 53 U/L — ABNORMAL HIGH (ref 0–44)
AST: 36 U/L (ref 15–41)
Albumin: 3.8 g/dL (ref 3.5–5.0)
Alkaline Phosphatase: 86 U/L (ref 38–126)
Bilirubin, Direct: 0.1 mg/dL (ref 0.0–0.2)
Indirect Bilirubin: 0.6 mg/dL (ref 0.3–0.9)
Total Bilirubin: 0.7 mg/dL (ref <1.2)
Total Protein: 7.2 g/dL (ref 6.5–8.1)

## 2023-02-02 LAB — PROTIME-INR
INR: 1 (ref 0.8–1.2)
Prothrombin Time: 13.7 s (ref 11.4–15.2)

## 2023-02-02 LAB — FERRITIN: Ferritin: 73 ng/mL (ref 24–336)

## 2023-02-02 LAB — IRON AND TIBC
Iron: 73 ug/dL (ref 45–182)
Saturation Ratios: 22 % (ref 17.9–39.5)
TIBC: 329 ug/dL (ref 250–450)
UIBC: 256 ug/dL

## 2023-02-02 NOTE — Patient Instructions (Addendum)
English instructions: Please have blood work completed at [facility].  We will arrange to have an ultrasound of your liver at [facility].  Nutrition Recommendations:  High-protein diet from a primarily plant-based diet. Avoid red meat.  No raw or undercooked meat, seafood, or shellfish. Low-fat/cholesterol/carbohydrate diet. Limit sodium to no more than 2000 mg/day including everything that you eat and drink. Recommend at least 30 minutes of aerobic and resistance exercise 3 days/week.  Continue to avoid all alcohol.  We will follow-up with you in 6 months or sooner if needed.  It was nice to meet you today!  Ermalinda Memos, PA-C [facility] Gastroenterology   Spanish instructions: Por favor, hgase un anlisis de sangre en [place].  Haremos los arreglos para que le hagan una ecografa de su hgado en [place].  Recomendaciones nutricionales:  Dieta alta en protenas de una dieta principalmente basada en plantas. Evita las carnes rojas.  No hay carne, mariscos ni mariscos crudos o poco cocidos. Dieta baja en grasas/colesterol/carbohidratos. Limite el sodio a no ms de 2000 mg/da, incluyendo todo lo que come y bebe. Recomiende al menos 30 minutos de ejercicio [place] y de resistencia 3 [address]  [address] a [place].  Contine evitando todo el alcohol.  Nos pondremos en contacto con usted en 6 meses o antes si es necesario.  Ha sido un placer conocerte hoy!  Ermalinda Memos, PA-C [facility] Gastroenterology

## 2023-02-03 LAB — IGG, IGA, IGM
IgA: 560 mg/dL — ABNORMAL HIGH (ref 90–386)
IgG (Immunoglobin G), Serum: 1267 mg/dL (ref 603–1613)
IgM (Immunoglobulin M), Srm: 104 mg/dL (ref 20–172)

## 2023-02-03 LAB — AFP TUMOR MARKER: AFP, Serum, Tumor Marker: 2.4 ng/mL (ref 0.0–6.9)

## 2023-02-04 LAB — ANTI-SMOOTH MUSCLE ANTIBODY, IGG: F-Actin IgG: 7 U (ref 0–19)

## 2023-02-06 ENCOUNTER — Ambulatory Visit (HOSPITAL_COMMUNITY)
Admission: RE | Admit: 2023-02-06 | Discharge: 2023-02-06 | Disposition: A | Discharge status: Home / Self Care | Payer: Self-pay | Source: Ambulatory Visit | Attending: Gastroenterology | Admitting: Gastroenterology

## 2023-02-06 DIAGNOSIS — R7989 Other specified abnormal findings of blood chemistry: Secondary | ICD-10-CM | POA: Insufficient documentation

## 2023-02-06 DIAGNOSIS — K7030 Alcoholic cirrhosis of liver without ascites: Secondary | ICD-10-CM | POA: Insufficient documentation

## 2023-02-08 ENCOUNTER — Encounter: Payer: Self-pay | Admitting: *Deleted

## 2023-02-08 ENCOUNTER — Other Ambulatory Visit: Payer: Self-pay | Admitting: *Deleted

## 2023-02-08 DIAGNOSIS — K7030 Alcoholic cirrhosis of liver without ascites: Secondary | ICD-10-CM

## 2023-02-08 DIAGNOSIS — K769 Liver disease, unspecified: Secondary | ICD-10-CM

## 2023-02-08 NOTE — Addendum Note (Signed)
Addended by: Elinor Dodge on: 02/08/2023 04:27 PM   Modules accepted: Orders

## 2023-02-08 NOTE — Addendum Note (Signed)
Addended by: Elinor Dodge on: 02/08/2023 04:29 PM   Modules accepted: Orders

## 2023-02-12 ENCOUNTER — Other Ambulatory Visit (HOSPITAL_COMMUNITY): Payer: Self-pay

## 2023-02-15 ENCOUNTER — Ambulatory Visit (HOSPITAL_COMMUNITY)
Admission: RE | Admit: 2023-02-15 | Discharge: 2023-02-15 | Disposition: A | Discharge status: Home / Self Care | Payer: Self-pay | Source: Ambulatory Visit | Attending: Gastroenterology | Admitting: Gastroenterology

## 2023-02-15 DIAGNOSIS — K769 Liver disease, unspecified: Secondary | ICD-10-CM | POA: Insufficient documentation

## 2023-02-15 DIAGNOSIS — K7030 Alcoholic cirrhosis of liver without ascites: Secondary | ICD-10-CM | POA: Insufficient documentation

## 2023-02-15 MED ORDER — GADOBUTROL 1 MMOL/ML IV SOLN
8.5000 mL | Freq: Once | INTRAVENOUS | Status: AC | PRN
  Administered 2023-02-15: 8.5 mL via INTRAVENOUS

## 2023-03-04 NOTE — Progress Notes (Unsigned)
S:     No chief complaint on file.  48 y.o. male who presents for hypertension evaluation, education, and management.  PMH is significant for elevated blood pressure (without the dx of HTN), alcoholic cirrhosis, R radial tunnel syndrome, bilateral chronic knee & elbow pain. Patient was referred and last seen by Primary Care Provider, Dr. Laural Benes, on 01/04/2023. BP was 120/85 at that visit. At last pharmacist visit on 02/01/23, BP was 126/82. Given ASCVD risk < 10%, patient was counseled on lifestyle interventions including reduced dietary sodium, increased exercise, adequate sleepand medication was not initiated.   Today, patient arrives in good spirits and presents without assistance. Denies *** dizziness, headache, blurred vision, swelling.   Family/Social history:  Fhx: no pertinent positives  Tobacco: former smoker (quit in 2014) Alcohol: former alcohol abuse. None since 2014.  Medication adherence: does not currently take antihypertensives.   Reported home BP readings:  -None  Patient reported dietary habits:  -Compliant with sodium restriction -Denies drinking caffeine   Patient-reported exercise habits:  -Stationary bike, walking, lifts weights -3 hours every 2-3 days during the week  O:  There were no vitals filed for this visit.  There is no height or weight on file to calculate BMI.   Last 3 Office BP readings: BP Readings from Last 3 Encounters:  02/02/23 138/83  02/01/23 126/82  01/04/23 120/85    BMET    Component Value Date/Time   NA 140 01/04/2023 1419   K 4.1 01/04/2023 1419   CL 106 01/04/2023 1419   CO2 19 (L) 01/04/2023 1419   GLUCOSE 90 01/04/2023 1419   GLUCOSE 106 (H) 03/21/2022 1056   BUN 11 01/04/2023 1419   CREATININE 1.25 01/04/2023 1419   CREATININE 1.02 08/12/2015 1516   CALCIUM 9.6 01/04/2023 1419   GFRNONAA >60 03/21/2022 1056   GFRNONAA >89 08/12/2015 1516   GFRAA >60 09/24/2019 1609   GFRAA >89 08/12/2015 1516    Renal  function: CrCl cannot be calculated (Patient's most recent lab result is older than the maximum 21 days allowed.).  Clinical ASCVD: No  The 10-year ASCVD risk score (Arnett DK, et al., 2019) is: 1.2%   Values used to calculate the score:     Age: 36 years     Sex: Male     Is Non-Hispanic African American: No     Diabetic: No     Tobacco smoker: No     Systolic Blood Pressure: 138 mmHg     Is BP treated: No     HDL Cholesterol: 68 mg/dL     Total Cholesterol: 174 mg/dL  Patient is participating in a Managed Medicaid Plan: No   If BP remains >130/80 after ~3 mo of lifestyle interventions (since PCP visit in Nov), could consider initiation of first line medication. BP at gastro appt next day was 138/80 > compare to home readings.  Ask about famhx of diabetes Cr 1.25 (eGFR 72), K 4.1, LFTs slightly elevated - improved on most recent HFP.  No A1c since 2014 - though BG on CMPs have been normal BP Readings from Last 3 Encounters:  02/02/23 138/83  02/01/23 126/82  01/04/23 120/85     A/P: Hypertension undiagnosed. BP goal in this setting should be normotension. He has no compelling indication and his ASCVD risk is low. BP today is 126/82 mmHg. Technically, you could classify this as Stage I HTN. His ASCVD risk is < 10%, so we will continue to monitor but can hold off on  medication at this time.  -Continued without medication for now.  -Counseled on lifestyle modifications for blood pressure control including reduced dietary sodium, increased exercise, adequate sleep. -Encouraged patient to check BP at home and bring log of readings to next visit. Counseled on proper use of home BP cuff.   Results reviewed and written information provided.    Written patient instructions provided. Patient verbalized understanding of treatment plan.  Total time in face to face counseling 15 minutes.    Follow-up:  Pharmacist in 1 month.  Nils Pyle, PharmD PGY1 Pharmacy Resident  Butch Penny, PharmD, BCACP, CPP Clinical Pharmacist [facility] & [facility] [phone]

## 2023-03-05 ENCOUNTER — Ambulatory Visit: Payer: Self-pay | Attending: Nurse Practitioner | Admitting: Pharmacist

## 2023-03-05 ENCOUNTER — Other Ambulatory Visit: Payer: Self-pay

## 2023-03-05 ENCOUNTER — Encounter: Payer: Self-pay | Admitting: Pharmacist

## 2023-03-05 VITALS — BP 134/91 | HR 81

## 2023-03-05 DIAGNOSIS — I10 Essential (primary) hypertension: Secondary | ICD-10-CM

## 2023-03-05 MED ORDER — VALSARTAN 80 MG PO TABS
80.0000 mg | ORAL_TABLET | Freq: Every day | ORAL | 3 refills | Status: DC
Start: 2023-03-05 — End: 2024-01-07
  Filled 2023-03-05: qty 90, 90d supply, fill #0
  Filled 2023-06-05 (×2): qty 90, 90d supply, fill #1
  Filled 2023-08-28: qty 90, 90d supply, fill #2
  Filled 2023-08-29: qty 60, 60d supply, fill #2
  Filled 2023-10-31 – 2023-11-01 (×2): qty 60, 60d supply, fill #3
  Filled 2023-12-31: qty 60, 60d supply, fill #4

## 2023-03-28 ENCOUNTER — Encounter: Payer: Self-pay | Admitting: *Deleted

## 2023-04-06 NOTE — Progress Notes (Signed)
 Interpretor: Michelle Nasuti [phone]  S:     No chief complaint on file.  48 y.o. male who presents for hypertension evaluation, education, and management.   PMH is significant for Stage 2 HTN, alcoholic cirrhosis, R radial tunnel syndrome, bilateral chronic knee & elbow pain. Patient was referred and last seen by Primary Care Provider, Dr. Laural Benes, on 01/04/2023. BP was 120/85 at that visit. Patient was seen by gastroenterology - had a f/u US on 02/06/23 and MRI on 02/22/23 which demonstrated cirrhotic morphology of liver, diffuse fatty liver, no aggressive liver lesion. No varices on last EGD in Feb 2024, though he does have a history of grade 1 esophageal varices in the past.   At last pharmacist visit on 03/05/23, BP was 134/91. Stage 2 hypertension, currently uncontrolled evidenced by consistently elevated DBP > 80. Started valsartan 80 mg daily. Of note, started on RAAS agent for potential kidney protection (uptrending Scr), despite low ASCVD risk < 10%. Pt without esophageal varices per last EGD so there was no indication to initiate a beta-blocker.  Today, patient arrives in good spirits and presents without assistance. Denies dizziness, headache, chest pain, ShOB, swelling. Endorses significant improvement in vision from medication.   Family/Social history:  Fhx: no pertinent positives, denies family history of DM Tobacco: former smoker (quit in 2014) Alcohol: former alcohol abuse. None since 2014.  Hypertension Medications: Valsartan 80 mg once daily Medication adherence reported. Takes medication everyday at 3pm.   Reported home BP readings (uses the upper arm cuff at the [facility] pharmacy) -3 days ago: 110/80 -115/86 -110/79 -112/80  Patient reported dietary habits:  -Compliant with sodium restriction - states that he avoids adding any salt to food -Denies drinking caffeine  -Stays hydrated with water during the day (7-8 bottles/day)  Patient-reported exercise habits:  -Stationary  bike, walking, lifts weights -3 hours every 2-3 days during the week  O:  Today's Vitals   04/09/23 0854 04/09/23 0900  BP: 126/85 126/84  Pulse: 80 77   There is no height or weight on file to calculate BMI.  Last 3 Office BP readings: BP Readings from Last 3 Encounters:  04/09/23 126/84  03/05/23 (!) 134/91  02/02/23 138/83   BMET    Component Value Date/Time   NA 140 01/04/2023 1419   K 4.1 01/04/2023 1419   CL 106 01/04/2023 1419   CO2 19 (L) 01/04/2023 1419   GLUCOSE 90 01/04/2023 1419   GLUCOSE 106 (H) 03/21/2022 1056   BUN 11 01/04/2023 1419   CREATININE 1.25 01/04/2023 1419   CREATININE 1.02 08/12/2015 1516   CALCIUM 9.6 01/04/2023 1419   GFRNONAA >60 03/21/2022 1056   GFRNONAA >89 08/12/2015 1516   GFRAA >60 09/24/2019 1609   GFRAA >89 08/12/2015 1516   Renal function: CrCl cannot be calculated (Patient's most recent lab result is older than the maximum 21 days allowed.).  Clinical ASCVD: No  The 10-year ASCVD risk score (Arnett DK, et al., 2019) is: 1.2%   Values used to calculate the score:     Age: 60 years     Sex: Male     Is Non-Hispanic African American: No     Diabetic: No     Tobacco smoker: No     Systolic Blood Pressure: 126 mmHg     Is BP treated: Yes     HDL Cholesterol: 68 mg/dL     Total Cholesterol: 174 mg/dL  Patient is participating in a Managed Medicaid Plan: No   BP Readings  from Last 3 Encounters:  04/09/23 126/84  03/05/23 (!) 134/91  02/02/23 138/83   A/P: Hypertension diagnosed recently currently controlled on current medications. BP goal < 130/80  mmHg. Medication adherence appears optimal. Reports to taking at 3 pm everyday. -Continued valsartan 80 mg PO daily  -F/u BMP8+eGFR and microalbumin/creatinine ratio -Counseled on lifestyle modifications for blood pressure control including reduced dietary sodium, increased exercise, adequate sleep. Educated patient that his medication will not interfere with his liver disease.   -Encouraged patient to check BP at home 1-2 times per week and bring readings to next visit. Counseled on proper use of home BP cuff.   Results reviewed and written information provided.    Written patient instructions provided. Patient verbalized understanding of treatment plan.  Total time in face to face counseling 25 minutes.    Follow-up:  Pharmacist: 05/08/23 PCP: 07/05/23  Seen By: Haywood Filler, PharmD Candidate [employer]  Class of 2027  Butch Penny, PharmD, [place], CPP Clinical Pharmacist [facility] & [facility] [phone]

## 2023-04-09 ENCOUNTER — Other Ambulatory Visit: Payer: Self-pay

## 2023-04-09 ENCOUNTER — Ambulatory Visit: Payer: Self-pay | Attending: Internal Medicine | Admitting: Pharmacist

## 2023-04-09 ENCOUNTER — Encounter: Payer: Self-pay | Admitting: Pharmacist

## 2023-04-09 VITALS — BP 126/84 | HR 77

## 2023-04-09 DIAGNOSIS — I10 Essential (primary) hypertension: Secondary | ICD-10-CM

## 2023-04-11 LAB — BMP8+EGFR
BUN/Creatinine Ratio: 12 (ref 9–20)
BUN: 12 mg/dL (ref 6–24)
CO2: 20 mmol/L (ref 20–29)
Calcium: 9.7 mg/dL (ref 8.7–10.2)
Chloride: 105 mmol/L (ref 96–106)
Creatinine, Ser: 1.04 mg/dL (ref 0.76–1.27)
Glucose: 99 mg/dL (ref 70–99)
Potassium: 4.6 mmol/L (ref 3.5–5.2)
Sodium: 139 mmol/L (ref 134–144)
eGFR: 90 mL/min/{1.73_m2} (ref >59)

## 2023-04-11 LAB — MICROALBUMIN / CREATININE URINE RATIO
Creatinine, Urine: 236.5 mg/dL
Microalb/Creat Ratio: 49 mg/g{creat} — ABNORMAL HIGH (ref 0–29)
Microalbumin, Urine: 115.1 ug/mL

## 2023-05-08 ENCOUNTER — Ambulatory Visit: Payer: Self-pay | Attending: Internal Medicine | Admitting: Pharmacist

## 2023-05-08 ENCOUNTER — Encounter: Payer: Self-pay | Admitting: Pharmacist

## 2023-05-08 VITALS — BP 129/86 | HR 80

## 2023-05-08 DIAGNOSIS — I10 Essential (primary) hypertension: Secondary | ICD-10-CM

## 2023-05-08 NOTE — Progress Notes (Signed)
 [id number], Blenda Mounts: #[id number], Chong Sicilian:     No chief complaint on file.  48 y.o. male who presents for hypertension evaluation, education, and management.   PMH is significant for Stage 2 HTN, alcoholic cirrhosis, R radial tunnel syndrome, bilateral chronic knee & elbow pain. Patient was referred and last seen by Primary Care Provider, Dr. Laural Benes, on 01/04/2023. We saw him in follow-up on 03/05/23 and started valsartan. Last month, his BP was 126/84 mmHg. We also took labs - eGFR was nl and K was 4.6. Of note, UACR was elevated but mildly so (ratio was 49).   Today, patient arrives in good spirits and presents without assistance. Denies dizziness, headache, chest pain, ShOB, swelling.   Family/Social history:  Fhx: no pertinent positives, denies family history of DM Tobacco: former smoker (quit in 2014) Alcohol: former alcohol abuse. None since 2014.  Hypertension Medications: Valsartan 80 mg once daily Medication adherence reported. Takes medication everyday at 11:30a. Has not taken yet this morning.   Reported home BP readings (uses the upper arm cuff at the [facility] pharmacy) -Since last visit last month, checks blood pressure a couple of times -Forgot to bring his log today. Is able to recall 120s/80s  Patient reported dietary habits:  -Compliant with sodium restriction - states that he avoids adding any salt to food -Denies drinking caffeine  -Stays hydrated with water during the day (7-8 bottles/day)  Patient-reported exercise habits:  -Stationary bike, walking, lifts weights -3 hours every 2-3 days during the week -Endorses 14 lb weight loss since 12/2022 - intentionally.   O:  Today's Vitals   05/08/23 1007  BP: 129/86  Pulse: 80    There is no height or weight on file to calculate BMI.  Last 3 Office BP readings: BP Readings from Last 3 Encounters:  05/08/23 129/86  04/09/23 126/84  03/05/23 (!) 134/91   BMET    Component Value Date/Time    NA 139 04/09/2023 0939   K 4.6 04/09/2023 0939   CL 105 04/09/2023 0939   CO2 20 04/09/2023 0939   GLUCOSE 99 04/09/2023 0939   GLUCOSE 106 (H) 03/21/2022 1056   BUN 12 04/09/2023 0939   CREATININE 1.04 04/09/2023 0939   CREATININE 1.02 08/12/2015 1516   CALCIUM 9.7 04/09/2023 0939   GFRNONAA >60 03/21/2022 1056   GFRNONAA >89 08/12/2015 1516   GFRAA >60 09/24/2019 1609   GFRAA >89 08/12/2015 1516   Renal function: CrCl cannot be calculated (Patient's most recent lab result is older than the maximum 21 days allowed.).  Clinical ASCVD: No  The 10-year ASCVD risk score (Arnett DK, et al., 2019) is: 1.3%   Values used to calculate the score:     Age: 72 years     Sex: Male     Is Non-Hispanic African American: No     Diabetic: No     Tobacco smoker: No     Systolic Blood Pressure: 129 mmHg     Is BP treated: Yes     HDL Cholesterol: 68 mg/dL     Total Cholesterol: 174 mg/dL  Patient is participating in a Managed Medicaid Plan: No   BP Readings from Last 3 Encounters:  05/08/23 129/86  04/09/23 126/84  03/05/23 (!) 134/91   A/P: Hypertension diagnosed currently above goal on current medications. BP goal < 130/80  mmHg. Medication adherence appears optimal but he has not yet taken the valsartan today. Additionally, reported home blood pressure readings are  consistently at goal.  -Continued valsartan 80 mg PO daily  -Counseled on lifestyle modifications for blood pressure control including reduced dietary sodium, increased exercise, adequate sleep. Educated patient that his medication will not interfere with his liver disease.  -Encouraged patient to check BP at home 1-2 times per week and bring readings to next visit. Counseled on proper use of home BP cuff.   Results reviewed and written information provided.    Written patient instructions provided. Patient verbalized understanding of treatment plan.  Total time in face to face counseling 25 minutes.    Follow-up:   PCP: 07/05/23  Butch Penny, PharmD, BCACP, CPP Clinical Pharmacist [facility] [phone]

## 2023-06-05 ENCOUNTER — Other Ambulatory Visit: Payer: Self-pay

## 2023-06-06 ENCOUNTER — Other Ambulatory Visit: Payer: Self-pay

## 2023-06-07 ENCOUNTER — Other Ambulatory Visit: Payer: Self-pay

## 2023-06-18 ENCOUNTER — Encounter: Payer: Self-pay | Admitting: Gastroenterology

## 2023-07-04 ENCOUNTER — Other Ambulatory Visit: Payer: Self-pay

## 2023-07-05 ENCOUNTER — Ambulatory Visit: Payer: Self-pay | Attending: Internal Medicine | Admitting: Internal Medicine

## 2023-07-05 ENCOUNTER — Encounter: Payer: Self-pay | Admitting: Internal Medicine

## 2023-07-05 ENCOUNTER — Other Ambulatory Visit: Payer: Self-pay

## 2023-07-05 VITALS — BP 115/77 | HR 81 | Temp 98.2°F | Ht 68.0 in | Wt 169.0 lb

## 2023-07-05 DIAGNOSIS — Z23 Encounter for immunization: Secondary | ICD-10-CM

## 2023-07-05 DIAGNOSIS — K2960 Other gastritis without bleeding: Secondary | ICD-10-CM

## 2023-07-05 DIAGNOSIS — M79641 Pain in right hand: Secondary | ICD-10-CM

## 2023-07-05 DIAGNOSIS — M25562 Pain in left knee: Secondary | ICD-10-CM

## 2023-07-05 DIAGNOSIS — M25561 Pain in right knee: Secondary | ICD-10-CM

## 2023-07-05 DIAGNOSIS — G8929 Other chronic pain: Secondary | ICD-10-CM

## 2023-07-05 DIAGNOSIS — K7030 Alcoholic cirrhosis of liver without ascites: Secondary | ICD-10-CM

## 2023-07-05 DIAGNOSIS — I10 Essential (primary) hypertension: Secondary | ICD-10-CM

## 2023-07-05 DIAGNOSIS — M79642 Pain in left hand: Secondary | ICD-10-CM

## 2023-07-05 MED ORDER — DICLOFENAC SODIUM 1 % EX GEL
2.0000 g | Freq: Four times a day (QID) | CUTANEOUS | 2 refills | Status: DC
  Filled 2023-10-02: qty 100, 13d supply, fill #0
  Filled 2023-10-31: qty 100, 13d supply, fill #1
  Filled 2023-12-31: qty 100, 13d supply, fill #2

## 2023-07-05 MED ORDER — OMEPRAZOLE 20 MG PO CPDR
20.0000 mg | DELAYED_RELEASE_CAPSULE | Freq: Every day | ORAL | 2 refills | Status: AC
  Filled 2023-08-28 – 2023-10-02 (×2): qty 90, 90d supply, fill #0
  Filled 2023-12-31: qty 90, 90d supply, fill #1

## 2023-07-05 NOTE — Progress Notes (Signed)
 Patient ID: Danny Arnold, male    DOB: Mar 27, 1975  MRN: [id number]  CC: Follow-up (Follow-up. Med refill. /On-going pain of hands & joints /Yes to pneumonia vax)   Subjective: Danny Arnold is a 48 y.o. male who presents for chronic ds management. His concerns today include:  Hx of HTN, ETOH cirrhosis, esophageal varices followed by GI   AMN Language interpreter used during this encounter. #Oswaldo [id number]  BP elev on last visit with me in Nov 2024.  Pt f/u with clinical pharmacist and BP was still elev so pt started on Diovan .  Taking and tolerating the Diovan  80 mg.  Checks BP at [facility] 2-3x/wk. Reports good readings.    Cirrhosis: Saw GI since last visit as well.  Liver US  showed 1.8 cm mass.  MRI revealed this was an hemangioma.  AFP for screening was nl. Request RF on Omeprazole  for heartburn.  Helps a lot.  C/o pain in both knees and hands Constant but little better with exercise.  No stiffness in mornings or at nights No swelling in the knees or hands.  Does not take anything for it.   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   Arthritis of elbow, right 07/07/2021   Radial tunnel syndrome, right 12/22/2020   Influenza vaccine needed 04/12/2020   Patellofemoral pain syndrome of both knees 08/05/2019   Lateral epicondylitis of both elbows 08/05/2019   Constipation 12/21/2017   Elevated blood pressure reading 12/04/2017   History of alcohol abuse 06/19/2017   Gastritis and gastroduodenitis    Umbilical hernia 11/12/2013   Alcoholic hepatitis 11/24/2012   Cirrhosis, alcoholic (HCC) 10/28/2012     Current Outpatient Medications on File Prior to Visit  Medication Sig Dispense Refill   diclofenac  Sodium (VOLTAREN ) 1 % GEL Apply 2 g topically 4 (four) times daily. 100 g 2   omeprazole  (PRILOSEC) 20 MG capsule Take 1 capsule (20 mg total) by mouth daily before 30 minutes before breakfast. 90  capsule 2   valsartan  (DIOVAN ) 80 MG tablet Take 1 tablet (80 mg total) by mouth daily. 90 tablet 3   No current facility-administered medications on file prior to visit.    No Known Allergies  Social History   Socioeconomic History   Marital status: Single    Spouse name: Not on file   Number of children: Not on file   Years of education: Not on file   Highest education level: Not on file  Occupational History   Not on file  Tobacco Use   Smoking status: Former    Current packs/day: 0.00    Average packs/day: 0.3 packs/day for 15.0 years (3.8 ttl pk-yrs)    Types: Cigarettes    Start date: 04/03/1997    Quit date: 04/03/2012    Years since quitting: 11.2   Smokeless tobacco: Never   Tobacco comments:    Quit smoking more than a year ago  Vaping Use   Vaping status: Never Used  Substance and Sexual Activity   Alcohol use: No    Comment: As of 03/31/21: heavy etoh until 02/2012- none since 2014.   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   Social Drivers of Health   Financial Resource Strain: Low Risk  (07/05/2023)   Overall Financial Resource Strain (CARDIA)  Difficulty of Paying Living Expenses: Not very hard  Food Insecurity: No Food Insecurity (07/05/2023)   Hunger Vital Sign    Worried About Running Out of Food in the Last Year: Never true    Ran Out of Food in the Last Year: Never true  Transportation Needs: No Transportation Needs (07/05/2023)   PRAPARE - Administrator, Civil Service (Medical): No    Lack of Transportation (Non-Medical): No  Physical Activity: Sufficiently Active (07/05/2023)   Exercise Vital Sign    Days of Exercise per Week: 4 days    Minutes of Exercise per Session: 120 min  Stress: No Stress Concern Present (07/05/2023)   [employer] of Occupational Health - Occupational Stress Questionnaire    Feeling of Stress  : Not at all  Social Connections: Socially Isolated (07/05/2023)   Social Connection and Isolation Panel [NHANES]    Frequency of Communication with Friends and Family: More than three times a week    Frequency of Social Gatherings with Friends and Family: More than three times a week    Attends Religious Services: Never    Database administrator or Organizations: No    Attends Banker Meetings: Never    Marital Status: Separated  Intimate Partner Violence: Not At Risk (07/05/2023)   Humiliation, Afraid, Rape, and Kick questionnaire    Fear of Current or Ex-Partner: No    Emotionally Abused: No    Physically Abused: No    Sexually Abused: No    Family History  Problem Relation Age of Onset   Migraines Mother    Colon cancer Neg Hx     Past Surgical History:  Procedure Laterality Date   BIOPSY  04/10/2022   Procedure: BIOPSY;  Surgeon: Vinetta Greening, DO;  [facility];  Service: Endoscopy;;   COLONOSCOPY N/A 11/25/2012   [facility]   COLONOSCOPY WITH PROPOFOL  N/A 04/10/2022   Procedure: COLONOSCOPY WITH PROPOFOL ;  Surgeon: Vinetta Greening, DO;  [facility];  Service: Endoscopy;  Laterality: N/A;  11:00 am   ESOPHAGEAL BANDING N/A 04/08/2019   Procedure: ESOPHAGEAL BANDING;  Surgeon: Alyce Jubilee, MD;  [facility];  Service: Endoscopy;  Laterality: N/A;   ESOPHAGOGASTRODUODENOSCOPY N/A 11/25/2012   GRADE 1 VARICES   ESOPHAGOGASTRODUODENOSCOPY N/A 04/20/2016   Procedure: ESOPHAGOGASTRODUODENOSCOPY (EGD);  Surgeon: Alyce Jubilee, MD;  [facility];  Service: Endoscopy;  Laterality: N/A;  8:30am   ESOPHAGOGASTRODUODENOSCOPY (EGD) WITH PROPOFOL  N/A 04/08/2019   Grade 1 varices, moderate gastritis and duodenitis, surveillance due in 2024.   ESOPHAGOGASTRODUODENOSCOPY (EGD) WITH PROPOFOL  N/A 04/10/2022   Procedure: ESOPHAGOGASTRODUODENOSCOPY (EGD) WITH PROPOFOL ;  Surgeon: Vinetta Greening, DO;  [facility];  Service:  Endoscopy;  Laterality: N/A;   HAND SURGERY Left    removal of foreign body and tendon repair.   HEMORRHOID BANDING  2014 x1   POLYPECTOMY  04/10/2022   Procedure: POLYPECTOMY;  Surgeon: Vinetta Greening, DO;  [facility];  Service: Endoscopy;;   UMBILICAL HERNIA REPAIR N/A 08/30/2015   Procedure: UMBILICAL HERNIORRHAPHY WITH MESH;  Surgeon: Alanda Allegra, MD;  Location: [facility];  Service: General;  Laterality: N/A;    ROS: Review of Systems Negative except as stated above  PHYSICAL EXAM: BP 115/77 (BP Location: Left Arm, Patient Position: Sitting, Cuff Size: Normal)   Pulse 81   Temp 98.2 F (36.8 C) (Oral)   Ht 5\' 8"  (1.727 m)   Wt 169  lb (76.7 kg)   SpO2 97%   BMI 25.70 kg/m   Physical Exam   General appearance - alert, well appearing, and in no distress Mental status - normal mood, behavior, speech, dress, motor activity, and thought processes Chest - clear to auscultation, no wheezes, rales or rhonchi, symmetric air entry Heart - normal rate, regular rhythm, normal S1, S2, no murmurs, rubs, clicks or gallops Musculoskeletal -knees: No enlargement of the joints.  No point tenderness.  Good passive range of motion.  No crepitus appreciated. Hands: Slight enlargement of the PIP joints on the right hand otherwise joints on both hands appear normal.  No signs of active tenosynovitis.  Grip 5/5 bilaterally. Extremities - peripheral pulses normal, no pedal edema, no clubbing or cyanosis     Latest Ref Rng & Units 04/09/2023    9:39 AM 02/02/2023   11:02 AM 01/04/2023    2:19 PM  CMP  Glucose 70 - 99 mg/dL 99   90   BUN 6 - 24 mg/dL 12   11   Creatinine 1.61 - 1.27 mg/dL 0.96   0.45   Sodium 409 - 144 mmol/L 139   140   Potassium 3.5 - 5.2 mmol/L 4.6   4.1   Chloride 96 - 106 mmol/L 105   106   CO2 20 - 29 mmol/L 20   19   Calcium 8.7 - 10.2 mg/dL 9.7   9.6   Total Protein 6.5 - 8.1 g/dL  7.2  7.4   Total Bilirubin <1.2 mg/dL  0.7  0.7   Alkaline Phos 38 - 126  U/L  86  117   AST 15 - 41 U/L  36  45   ALT 0 - 44 U/L  53  65    Lipid Panel     Component Value Date/Time   CHOL 174 01/04/2023 1419   TRIG 109 01/04/2023 1419   HDL 68 01/04/2023 1419   CHOLHDL 2.6 01/04/2023 1419   LDLCALC 87 01/04/2023 1419    CBC    Component Value Date/Time   WBC 8.8 01/04/2023 1419   WBC 8.3 03/21/2022 1056   RBC 5.69 01/04/2023 1419   RBC 5.87 (H) 03/21/2022 1056   HGB 16.8 01/04/2023 1419   HCT 49.7 01/04/2023 1419   PLT 266 01/04/2023 1419   MCV 87 01/04/2023 1419   MCH 29.5 01/04/2023 1419   MCH 30.2 03/21/2022 1056   MCHC 33.8 01/04/2023 1419   MCHC 34.4 03/21/2022 1056   RDW 12.3 01/04/2023 1419   LYMPHSABS 1.9 03/21/2022 1056   MONOABS 0.7 03/21/2022 1056   EOSABS 0.2 03/21/2022 1056   BASOSABS 0.0 03/21/2022 1056    ASSESSMENT AND PLAN: 1. Essential hypertension (Primary) At goal.  Continue Diovan  80 mg daily.  Continue to monitor blood pressure about twice a month with goal being 130/80 or lower.  2. Alcoholic cirrhosis of liver without ascites (HCC) Stable and compensated.  Patient has been in sustained remission of alcohol use.  3. Chronic pain of both knees  - diclofenac  Sodium (VOLTAREN ) 1 % GEL; Apply 2 g topically 4 (four) times daily.  Dispense: 100 g; Refill: 2  4. Pain in both hands May be early arthritis.  No signs to suggest inflammatory arthritis.  Recommend use of Voltaren  gel as needed. - diclofenac  Sodium (VOLTAREN ) 1 % GEL; Apply 2 g topically 4 (four) times daily.  Dispense: 100 g; Refill: 2  5. Other gastritis without hemorrhage, unspecified chronicity Continue omeprazole . - omeprazole  (  PRILOSEC) 20 MG capsule; Take 1 capsule (20 mg total) by mouth daily before 30 minutes before breakfast.  Dispense: 90 capsule; Refill: 2  6. Need for vaccination against Streptococcus pneumoniae PCV 20 given today.    Patient was given the opportunity to ask questions.  Patient verbalized understanding of the plan and was  able to repeat key elements of the plan.   This documentation was completed using Paediatric nurse.  Any transcriptional errors are unintentional.  No orders of the defined types were placed in this encounter.    Requested Prescriptions   Pending Prescriptions Disp Refills   diclofenac  Sodium (VOLTAREN ) 1 % GEL 100 g 2    Sig: Apply 2 g topically 4 (four) times daily.   omeprazole  (PRILOSEC) 20 MG capsule 90 capsule 2    Sig: Take 1 capsule (20 mg total) by mouth daily before 30 minutes before breakfast.    No follow-ups on file.  Concetta Dee, MD, FACP

## 2023-07-11 NOTE — Addendum Note (Signed)
 Addended by: Laddie Naeem N on: 07/11/2023 08:45 AM   Modules accepted: Orders

## 2023-08-19 NOTE — Progress Notes (Unsigned)
 Referring Provider: Vicci Barnie NOVAK, MD Primary Care Physician:  Vicci Barnie NOVAK, MD Primary GI Physician: Dr. Cindie  No chief complaint on file.   HPI:   Danny Arnold is a 48 y.o. male with history of alcoholic cirrhosis, grade 1 esophageal varices in the past, liver hemangioma, presenting today for 52-month follow-up.  Today:  MELD 3.0: 8 based on labs in November/December 2024.  Imaging: MRI abdomen with and without contrast 02/15/2023 with slight nodularity of the liver, small lesion in the liver most likely a flash filling subcentimeter hemangioma with shunting. AFP: 2.4 02/02/23.  Hep A/B vaccination: Immune  EGD:  Up-to-date. Surveillance due February 2027. No varices on last EGD in February 2024 though history of grade 1 esophageal varices in the past.  BB: No Ascites/peripheral edema:  Diuretics:  Paracentesis: Never History of SBP: No Encephalopathy:     Previous evaluation with negative ANA, AMA.  No alpha-1 antitrypsin deficiency.  Ceruloplasmin 41.  Ferritin previously mildly elevated at 414 in 2014, but normal in 2022 and 2024.  Immune to hep A and B.  Hep C antibody and viral RNA negative in 2014.  ASMA, IgG, IgM within normal limits December 2024, IgA elevated at 560.   ETOH: None. Last alcohol use 2014. *** Does drink an herbal tea 1 every 3 days. Supposedly it is supposed to maintain liver/clean out the body. *** No tylenol , or NSAIDs.  *** No drug use. ***   EGD 04/10/2022: Gastritis biopsied, otherwise normal exam.  Recommended daily PPI and repeat EGD in 3 years for surveillance.  Pathology showed gastric antral and oxyntic mucosa with chronic gastritis.  No H. pylori.   Colonoscopy 04/10/2022: Nonbleeding internal hemorrhoids, 4 mm polyp in the ascending colon resected and retrieved, 8 mm polyp in the rectosigmoid colon resected and retrieved.  Pathology with tubular adenomas.  Recommended 5-year surveillance.  Past Medical History:   Diagnosis Date   Alcoholic hepatitis    Alcoholic liver disease (HCC)    Anemia    Ascites 02/03/2013   Bacteremia     Past Surgical History:  Procedure Laterality Date   BIOPSY  04/10/2022   Procedure: BIOPSY;  Surgeon: Cindie Carlin POUR, DO;  [facility];  Service: Endoscopy;;   COLONOSCOPY N/A 11/25/2012   [facility]   COLONOSCOPY WITH PROPOFOL  N/A 04/10/2022   Procedure: COLONOSCOPY WITH PROPOFOL ;  Surgeon: Cindie Carlin POUR, DO;  [facility];  Service: Endoscopy;  Laterality: N/A;  11:00 am   ESOPHAGEAL BANDING N/A 04/08/2019   Procedure: ESOPHAGEAL BANDING;  Surgeon: Harvey Margo CROME, MD;  [facility];  Service: Endoscopy;  Laterality: N/A;   ESOPHAGOGASTRODUODENOSCOPY N/A 11/25/2012   GRADE 1 VARICES   ESOPHAGOGASTRODUODENOSCOPY N/A 04/20/2016   Procedure: ESOPHAGOGASTRODUODENOSCOPY (EGD);  Surgeon: Margo CROME Harvey, MD;  [facility];  Service: Endoscopy;  Laterality: N/A;  8:30am   ESOPHAGOGASTRODUODENOSCOPY (EGD) WITH PROPOFOL  N/A 04/08/2019   Grade 1 varices, moderate gastritis and duodenitis, surveillance due in 2024.   ESOPHAGOGASTRODUODENOSCOPY (EGD) WITH PROPOFOL  N/A 04/10/2022   Procedure: ESOPHAGOGASTRODUODENOSCOPY (EGD) WITH PROPOFOL ;  Surgeon: Cindie Carlin POUR, DO;  [facility];  Service: Endoscopy;  Laterality: N/A;   HAND SURGERY Left    removal of foreign body and tendon repair.   HEMORRHOID BANDING  2014 x1   POLYPECTOMY  04/10/2022   Procedure: POLYPECTOMY;  Surgeon: Cindie Carlin POUR, DO;  [facility];  Service: Endoscopy;;   UMBILICAL HERNIA REPAIR N/A 08/30/2015  Procedure: UMBILICAL HERNIORRHAPHY WITH MESH;  Surgeon: Oneil Budge, MD;  [facility]: [facility];  Service: General;  Laterality: N/A;    Current Outpatient Medications  Medication Sig Dispense Refill   diclofenac  Sodium (VOLTAREN ) 1 % GEL Apply 2 g topically 4 (four) times daily. 100 g 2   omeprazole  (PRILOSEC) 20 MG capsule Take 1  capsule (20 mg total) by mouth daily before 30 minutes before breakfast. 90 capsule 2   valsartan  (DIOVAN ) 80 MG tablet Take 1 tablet (80 mg total) by mouth daily. 90 tablet 3   No current facility-administered medications for this visit.    Allergies as of 08/22/2023   (No Known Allergies)    Family History  Problem Relation Age of Onset   Migraines Mother    Colon cancer Neg Hx     Social History   Socioeconomic History   Marital status: Single    Spouse name: Not on file   Number of children: Not on file   Years of education: Not on file   Highest education level: Not on file  Occupational History   Not on file  Tobacco Use   Smoking status: Former    Current packs/day: 0.00    Average packs/day: 0.3 packs/day for 15.0 years (3.8 ttl pk-yrs)    Types: Cigarettes    Start date: 04/03/1997    Quit date: 04/03/2012    Years since quitting: 11.3   Smokeless tobacco: Never   Tobacco comments:    Quit smoking more than a year ago  Vaping Use   Vaping status: Never Used  Substance and Sexual Activity   Alcohol use: No    Comment: As of 03/31/21: heavy etoh until 02/2012- none since 2014.   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   Social Drivers of Corporate investment banker Strain: Low Risk  (07/05/2023)   Overall Financial Resource Strain (CARDIA)    Difficulty of Paying Living Expenses: Not very hard  Food Insecurity: No Food Insecurity (07/05/2023)   Hunger Vital Sign    Worried About Running Out of Food in the Last Year: Never true    Ran Out of Food in the Last Year: Never true  Transportation Needs: No Transportation Needs (07/05/2023)   PRAPARE - Administrator, Civil Service (Medical): No    Lack of Transportation (Non-Medical): No  Physical Activity: Sufficiently Active (07/05/2023)   Exercise Vital Sign    Days of Exercise per Week: 4  days    Minutes of Exercise per Session: 120 min  Stress: No Stress Concern Present (07/05/2023)   [employer] of Occupational Health - Occupational Stress Questionnaire    Feeling of Stress : Not at all  Social Connections: Socially Isolated (07/05/2023)   Social Connection and Isolation Panel    Frequency of Communication with Friends and Family: More than three times a week    Frequency of Social Gatherings with Friends and Family: More than three times a week    Attends Religious Services: Never    Database administrator or Organizations: No    Attends Banker Meetings: Never    Marital Status: Separated    Review of Systems: Gen: Denies fever, chills, anorexia. Denies fatigue, weakness, weight loss.  CV: Denies chest pain, palpitations, syncope, peripheral edema, and claudication. Resp: Denies dyspnea  at rest, cough, wheezing, coughing up blood, and pleurisy. GI: Denies vomiting blood, jaundice, and fecal incontinence.   Denies dysphagia or odynophagia. Derm: Denies rash, itching, dry skin Psych: Denies depression, anxiety, memory loss, confusion. No homicidal or suicidal ideation.  Heme: Denies bruising, bleeding, and enlarged lymph nodes.  Physical Exam: There were no vitals taken for this visit. General:   Alert and oriented. No distress noted. Pleasant and cooperative.  Head:  Normocephalic and atraumatic. Eyes:  Conjuctiva clear without scleral icterus. Heart:  S1, S2 present without murmurs appreciated. Lungs:  Clear to auscultation bilaterally. No wheezes, rales, or rhonchi. No distress.  Abdomen:  +BS, soft, non-tender and non-distended. No rebound or guarding. No HSM or masses noted. Msk:  Symmetrical without gross deformities. Normal posture. Extremities:  Without edema. Neurologic:  Alert and  oriented x4 Psych:  Normal mood and affect.    Assessment:     Plan:  ***   Josette Centers, PA-C [facility] 08/22/2023

## 2023-08-22 ENCOUNTER — Encounter: Payer: Self-pay | Admitting: Gastroenterology

## 2023-08-22 ENCOUNTER — Encounter: Payer: Self-pay | Admitting: *Deleted

## 2023-08-22 ENCOUNTER — Ambulatory Visit (INDEPENDENT_AMBULATORY_CARE_PROVIDER_SITE_OTHER): Payer: Self-pay | Admitting: Gastroenterology

## 2023-08-22 VITALS — BP 128/80 | HR 79 | Temp 98.0°F | Ht 68.0 in | Wt 171.6 lb

## 2023-08-22 DIAGNOSIS — K7030 Alcoholic cirrhosis of liver without ascites: Secondary | ICD-10-CM

## 2023-08-22 DIAGNOSIS — F1091 Alcohol use, unspecified, in remission: Secondary | ICD-10-CM

## 2023-08-22 DIAGNOSIS — Z8719 Personal history of other diseases of the digestive system: Secondary | ICD-10-CM

## 2023-08-22 NOTE — Patient Instructions (Addendum)
 Instructions in English: I am glad you are feeling well overall!  We will get you scheduled for an ultrasound of your liver at [facility].  Please have blood work completed at [facility] when you have your ultrasound.  Nutrition:  High-protein diet from a primarily plant-based diet. Avoid red meat.  No raw or undercooked meat, seafood, or shellfish. Low-fat/cholesterol/carbohydrate diet. Limit sodium to no more than 2000 mg/day including everything that you eat and drink. Recommend at least 30 minutes of aerobic and resistance exercise 3 days/week.  I will see you back in 6 months or sooner if needed.  It was great to see you again today!  Josette Centers, PA-C [facility]   Instructions in Spanish: Me alegra que se sienta bien en general!  Ladora programaremos ignacia chiles de hgado en [facility].  Por favor, hgase un anlisis de [facility] cuando se haga la ecografa.  Nutricin: Dieta alta en protenas, principalmente a base de plantas. Evite la carne roja. No consuma carne, mariscos ni crustceos crudos o poco cocidos. Dieta baja en grasas, colesterol y carbohidratos. Limite el sodio a no ms de 2000 mg/da, incluyendo todo lo que come y bebe. Se recomiendan al menos 30 minutos de ejercicio [place] y de resistencia 3 das a la [place].  Nos vemos en 6 meses o antes si es necesario.  Fue un placer verle de nuevo hoy!  Josette Centers, PA-C [facility]enter[facility]

## 2023-08-29 ENCOUNTER — Other Ambulatory Visit: Payer: Self-pay

## 2023-08-30 ENCOUNTER — Ambulatory Visit (HOSPITAL_COMMUNITY)
Admission: RE | Admit: 2023-08-30 | Discharge: 2023-08-30 | Disposition: A | Discharge status: Home / Self Care | Payer: Self-pay | Source: Ambulatory Visit | Attending: Gastroenterology | Admitting: Gastroenterology

## 2023-08-30 ENCOUNTER — Other Ambulatory Visit: Payer: Self-pay

## 2023-08-30 ENCOUNTER — Other Ambulatory Visit (HOSPITAL_COMMUNITY)
Admission: RE | Admit: 2023-08-30 | Discharge: 2023-08-30 | Disposition: A | Discharge status: Home / Self Care | Payer: Self-pay | Source: Ambulatory Visit | Attending: Gastroenterology | Admitting: Gastroenterology

## 2023-08-30 DIAGNOSIS — K7030 Alcoholic cirrhosis of liver without ascites: Secondary | ICD-10-CM | POA: Insufficient documentation

## 2023-08-30 LAB — HEPATIC FUNCTION PANEL
ALT: 27 U/L (ref 0–44)
AST: 19 U/L (ref 15–41)
Albumin: 3.7 g/dL (ref 3.5–5.0)
Alkaline Phosphatase: 68 U/L (ref 38–126)
Bilirubin, Direct: <0.1 mg/dL (ref 0.0–0.2)
Total Bilirubin: 0.7 mg/dL (ref 0.0–1.2)
Total Protein: 7.4 g/dL (ref 6.5–8.1)

## 2023-08-30 LAB — CBC WITH DIFFERENTIAL/PLATELET
Abs Immature Granulocytes: 0.04 K/uL (ref 0.00–0.07)
Basophils Absolute: 0 K/uL (ref 0.0–0.1)
Basophils Relative: 0 %
Eosinophils Absolute: 0.2 K/uL (ref 0.0–0.5)
Eosinophils Relative: 2 %
HCT: 47.6 % (ref 39.0–52.0)
Hemoglobin: 16.1 g/dL (ref 13.0–17.0)
Immature Granulocytes: 1 %
Lymphocytes Relative: 29 %
Lymphs Abs: 2.3 K/uL (ref 0.7–4.0)
MCH: 30.9 pg (ref 26.0–34.0)
MCHC: 33.8 g/dL (ref 30.0–36.0)
MCV: 91.4 fL (ref 80.0–100.0)
Monocytes Absolute: 0.7 K/uL (ref 0.1–1.0)
Monocytes Relative: 9 %
Neutro Abs: 4.8 K/uL (ref 1.7–7.7)
Neutrophils Relative %: 59 %
Platelets: 236 K/uL (ref 150–400)
RBC: 5.21 MIL/uL (ref 4.22–5.81)
RDW: 13.6 % (ref 11.5–15.5)
WBC: 8 K/uL (ref 4.0–10.5)
nRBC: 0 % (ref 0.0–0.2)

## 2023-08-30 LAB — BASIC METABOLIC PANEL WITH GFR
Anion gap: 10 (ref 5–15)
BUN: 13 mg/dL (ref 6–20)
CO2: 25 mmol/L (ref 22–32)
Calcium: 9.1 mg/dL (ref 8.9–10.3)
Chloride: 104 mmol/L (ref 98–111)
Creatinine, Ser: 0.98 mg/dL (ref 0.61–1.24)
GFR, Estimated: >60 mL/min (ref >60)
Glucose, Bld: 107 mg/dL — ABNORMAL HIGH (ref 70–99)
Potassium: 3.9 mmol/L (ref 3.5–5.1)
Sodium: 139 mmol/L (ref 135–145)

## 2023-08-30 LAB — PROTIME-INR
INR: 1 (ref 0.8–1.2)
Prothrombin Time: 13.5 s (ref 11.4–15.2)

## 2023-08-31 LAB — AFP TUMOR MARKER: AFP, Serum, Tumor Marker: <1.8 ng/mL (ref 0.0–6.9)

## 2023-09-02 ENCOUNTER — Ambulatory Visit: Payer: Self-pay | Admitting: Gastroenterology

## 2023-09-07 ENCOUNTER — Ambulatory Visit: Payer: Self-pay | Admitting: Gastroenterology

## 2023-10-03 ENCOUNTER — Other Ambulatory Visit: Payer: Self-pay

## 2023-10-04 ENCOUNTER — Other Ambulatory Visit: Payer: Self-pay

## 2023-11-01 ENCOUNTER — Other Ambulatory Visit: Payer: Self-pay

## 2023-11-02 ENCOUNTER — Other Ambulatory Visit: Payer: Self-pay

## 2023-12-31 ENCOUNTER — Other Ambulatory Visit: Payer: Self-pay

## 2024-01-01 ENCOUNTER — Other Ambulatory Visit: Payer: Self-pay

## 2024-01-07 ENCOUNTER — Ambulatory Visit: Payer: Self-pay | Attending: Internal Medicine | Admitting: Internal Medicine

## 2024-01-07 ENCOUNTER — Encounter: Payer: Self-pay | Admitting: Internal Medicine

## 2024-01-07 ENCOUNTER — Other Ambulatory Visit: Payer: Self-pay

## 2024-01-07 VITALS — BP 134/92 | HR 81 | Ht 68.0 in | Wt 184.0 lb

## 2024-01-07 DIAGNOSIS — I10 Essential (primary) hypertension: Secondary | ICD-10-CM

## 2024-01-07 DIAGNOSIS — Z23 Encounter for immunization: Secondary | ICD-10-CM

## 2024-01-07 DIAGNOSIS — M25562 Pain in left knee: Secondary | ICD-10-CM

## 2024-01-07 DIAGNOSIS — G8929 Other chronic pain: Secondary | ICD-10-CM

## 2024-01-07 DIAGNOSIS — M25561 Pain in right knee: Secondary | ICD-10-CM

## 2024-01-07 DIAGNOSIS — K7030 Alcoholic cirrhosis of liver without ascites: Secondary | ICD-10-CM

## 2024-01-07 MED ORDER — VALSARTAN 80 MG PO TABS
80.0000 mg | ORAL_TABLET | Freq: Every day | ORAL | 3 refills | Status: AC
  Filled 2024-01-07 – 2024-02-28 (×2): qty 90, 90d supply, fill #0

## 2024-01-07 MED ORDER — AMLODIPINE BESYLATE 5 MG PO TABS
5.0000 mg | ORAL_TABLET | Freq: Every day | ORAL | 2 refills | Status: AC
  Filled 2024-01-07: qty 90, 90d supply, fill #0

## 2024-01-07 MED ORDER — ACETAMINOPHEN 500 MG PO TABS
500.0000 mg | ORAL_TABLET | Freq: Three times a day (TID) | ORAL | 0 refills | Status: AC | PRN
  Filled 2024-01-07: qty 60, 20d supply, fill #0

## 2024-01-07 NOTE — Patient Instructions (Signed)
  VISIT SUMMARY: Today, you were seen for bilateral knee pain and swelling, which you have been experiencing for the past three months. We also reviewed your blood pressure management and liver condition.  YOUR PLAN: -BILATERAL KNEE PAIN AND SWELLING, LIKELY OSTEOARTHRITIS: Osteoarthritis is a condition where the protective cartilage that cushions the ends of your bones wears down over time. We have ordered x-rays of both knees to get a clearer picture and recommended you take extra strength Tylenol  500 mg, one tablet two to three times a day as needed for pain relief. You have also been referred to an orthopedic specialist for further evaluation and management.  -ESSENTIAL HYPERTENSION: Hypertension, or high blood pressure, is when the force of the blood against your artery walls is too high. Your blood pressure readings are above the target range, so we have added amlodipine  5 mg daily to your current valsartan  regimen. Please continue to monitor your blood pressure at home.  -ALCOHOLIC CIRRHOSIS OF LIVER WITHOUT ASCITES: Alcoholic cirrhosis is a condition where the liver is severely scarred due to long-term alcohol abuse. Your liver condition is stable, and it is important to continue abstaining from alcohol, drugs, and smoking.  -GENERAL HEALTH MAINTENANCE: You received your flu shot today to help protect against the influenza virus.  INSTRUCTIONS: Please follow up with the orthopedic specialist as referred for your knee pain and swelling. Continue to monitor your blood pressure at home and take the new medication as prescribed. If you have any concerns or experience any side effects, please contact our office.                      Contains text generated by Abridge.                                 Contains text generated by Abridge.

## 2024-01-07 NOTE — Progress Notes (Signed)
 Patient ID: Danny Arnold, male    DOB: Mar 08, 1975  MRN: [id number]  CC: Hypertension (HTN f/u./Bilateral knee pain, intermittent swelling of L knee X3 mo/Flu vax administered on 01/07/24 C.A.)   Subjective: Danny Arnold is a 48 y.o. male who presents for chronic ds management. His concerns today include:  Hx of HTN, ETOH cirrhosis, esophageal varices followed by GI   AMN Language interpreter used during this encounter. #[id number]   Discussed the use of AI scribe software for clinical note transcription with the patient, who gave verbal consent to proceed.  History of Present Illness   Danny Arnold is a 48 year old male who presents with bilateral knee pain and swelling.  He has been experiencing constant pain in both knees for approximately three months, with the LT knee being more problematic due to swelling. The pain worsens with walking and improves with sitting, which helps reduce the swelling. He has tried using Voltaren  gel for inflammation, but finds it not very effective. Instead, he uses Vicks VapoRub mixed with lemon and salt as a home remedy, which provides relief for about three to four hours.  HTN: He is currently taking valsartan  80 mg daily for hypertension. He monitors his blood pressure at home, checking it twice a week, with readings ranging from 127/81 to 179/81. In-office readings were 130/92 and 134/92.  Hx of ETOH Cirrhosis: He abstains from alcohol, drugs, and smoking. He last saw a gastroenterologist in July, who told him that his liver condition was stable.       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   Arthritis of elbow, right 07/07/2021   Radial tunnel syndrome, right 12/22/2020   Influenza vaccine needed 04/12/2020   Patellofemoral pain syndrome of both knees 08/05/2019   Lateral epicondylitis of both elbows 08/05/2019   Constipation 12/21/2017    Elevated blood pressure reading 12/04/2017   History of alcohol abuse 06/19/2017   Gastritis and gastroduodenitis    Umbilical hernia 11/12/2013   Alcoholic hepatitis (HCC) 11/24/2012   Cirrhosis, alcoholic (HCC) 10/28/2012     Current Outpatient Medications on File Prior to Visit  Medication Sig Dispense Refill   omeprazole  (PRILOSEC) 20 MG capsule Take 1 capsule (20 mg total) by mouth daily before 30 minutes before breakfast. 90 capsule 2   No current facility-administered medications on file prior to visit.    No Known Allergies  Social History   Socioeconomic History   Marital status: Single    Spouse name: Not on file   Number of children: Not on file   Years of education: Not on file   Highest education level: Not on file  Occupational History   Not on file  Tobacco Use   Smoking status: Former    Current packs/day: 0.00    Average packs/day: 0.3 packs/day for 15.0 years (3.8 ttl pk-yrs)    Types: Cigarettes    Start date: 04/03/1997    Quit date: 04/03/2012    Years since quitting: 11.7   Smokeless tobacco: Never   Tobacco comments:    Quit smoking more than a year ago  Vaping Use   Vaping status: Never Used  Substance and Sexual Activity   Alcohol use: No    Comment: As of 03/31/21: heavy etoh until 02/2012- none since 2014.   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   Social Drivers of Corporate Investment Banker Strain: Low Risk  (07/05/2023)   Overall Financial Resource Strain (CARDIA)    Difficulty of Paying Living Expenses: Not very hard  Food Insecurity: No Food Insecurity (07/05/2023)   Hunger Vital Sign    Worried About Running Out of Food in the Last Year: Never true    Ran Out of Food in the Last Year: Never true  Transportation Needs: No Transportation Needs (07/05/2023)   PRAPARE - Administrator, Civil Service (Medical): No     Lack of Transportation (Non-Medical): No  Physical Activity: Sufficiently Active (07/05/2023)   Exercise Vital Sign    Days of Exercise per Week: 4 days    Minutes of Exercise per Session: 120 min  Stress: No Stress Concern Present (07/05/2023)   [employer] of Occupational Health - Occupational Stress Questionnaire    Feeling of Stress : Not at all  Social Connections: Socially Isolated (07/05/2023)   Social Connection and Isolation Panel    Frequency of Communication with Friends and Family: More than three times a week    Frequency of Social Gatherings with Friends and Family: More than three times a week    Attends Religious Services: Never    Database Administrator or Organizations: No    Attends Banker Meetings: Never    Marital Status: Separated  Intimate Partner Violence: Not At Risk (07/05/2023)   Humiliation, Afraid, Rape, and Kick questionnaire    Fear of Current or Ex-Partner: No    Emotionally Abused: No    Physically Abused: No    Sexually Abused: No    Family History  Problem Relation Age of Onset   Migraines Mother    Colon cancer Neg Hx     Past Surgical History:  Procedure Laterality Date   BIOPSY  04/10/2022   Procedure: BIOPSY;  Surgeon: Cindie Carlin POUR, DO;  [facility];  Service: Endoscopy;;   COLONOSCOPY N/A 11/25/2012   [facility]   COLONOSCOPY WITH PROPOFOL  N/A 04/10/2022   Procedure: COLONOSCOPY WITH PROPOFOL ;  Surgeon: Cindie Carlin POUR, DO;  [facility];  Service: Endoscopy;  Laterality: N/A;  11:00 am   ESOPHAGEAL BANDING N/A 04/08/2019   Procedure: ESOPHAGEAL BANDING;  Surgeon: Harvey Margo CROME, MD;  [facility]: [facility];  Service: Endoscopy;  Laterality: N/A;   ESOPHAGOGASTRODUODENOSCOPY N/A 11/25/2012   GRADE 1 VARICES   ESOPHAGOGASTRODUODENOSCOPY N/A 04/20/2016   Procedure: ESOPHAGOGASTRODUODENOSCOPY (EGD);  Surgeon: Margo CROME Harvey, MD;  [facility];  Service: Endoscopy;  Laterality: N/A;   8:30am   ESOPHAGOGASTRODUODENOSCOPY (EGD) WITH PROPOFOL  N/A 04/08/2019   Grade 1 varices, moderate gastritis and duodenitis, surveillance due in 2024.   ESOPHAGOGASTRODUODENOSCOPY (EGD) WITH PROPOFOL  N/A 04/10/2022   Procedure: ESOPHAGOGASTRODUODENOSCOPY (EGD) WITH PROPOFOL ;  Surgeon: Cindie Carlin POUR, DO;  [facility];  Service: Endoscopy;  Laterality: N/A;   HAND SURGERY Left    removal of foreign body and tendon repair.   HEMORRHOID BANDING  2014 x1   POLYPECTOMY  04/10/2022   Procedure: POLYPECTOMY;  Surgeon: Cindie Carlin POUR, DO;  [facility];  Service: Endoscopy;;   UMBILICAL HERNIA REPAIR N/A 08/30/2015   Procedure: UMBILICAL HERNIORRHAPHY WITH MESH;  Surgeon: Oneil Budge, MD;  Location: [facility];  Service: General;  Laterality: N/A;    ROS: Review of Systems Negative except as stated above  PHYSICAL EXAM:  BP (!) 134/92   Pulse 81   Ht 5' 8 (1.727 m)   Wt 184 lb (83.5 kg)   SpO2 97%   BMI 27.98 kg/m   Physical Exam   General appearance - alert, well appearing, and in no distress Mental status - normal mood, behavior, speech, dress, motor activity, and thought processes Neck - supple, no significant adenopathy Chest - clear to auscultation, no wheezes, rales or rhonchi, symmetric air entry Heart - normal rate, regular rhythm, normal S1, S2, no murmurs, rubs, clicks or gallops Abdomen - soft, nontender, nondistended, no masses or organomegaly Musculoskeletal - Knees: mild jt enlargement both knees RT>LT. No point tenderness. Good ROM BL Extremities - peripheral pulses normal, no pedal edema, no clubbing or cyanosis     Latest Ref Rng & Units 08/30/2023    8:58 AM 04/09/2023    9:39 AM 02/02/2023   11:02 AM  CMP  Glucose 70 - 99 mg/dL 892  99    BUN 6 - 20 mg/dL 13  12    Creatinine 9.38 - 1.24 mg/dL 9.01  8.95    Sodium 864 - 145 mmol/L 139  139    Potassium 3.5 - 5.1 mmol/L 3.9  4.6    Chloride 98 - 111 mmol/L 104  105    CO2 22 - 32  mmol/L 25  20    Calcium 8.9 - 10.3 mg/dL 9.1  9.7    Total Protein 6.5 - 8.1 g/dL 7.4   7.2   Total Bilirubin 0.0 - 1.2 mg/dL 0.7   0.7   Alkaline Phos 38 - 126 U/L 68   86   AST 15 - 41 U/L 19   36   ALT 0 - 44 U/L 27   53    Lipid Panel     Component Value Date/Time   CHOL 174 01/04/2023 1419   TRIG 109 01/04/2023 1419   HDL 68 01/04/2023 1419   CHOLHDL 2.6 01/04/2023 1419   LDLCALC 87 01/04/2023 1419    CBC    Component Value Date/Time   WBC 8.0 08/30/2023 0858   RBC 5.21 08/30/2023 0858   HGB 16.1 08/30/2023 0858   HGB 16.8 01/04/2023 1419   HCT 47.6 08/30/2023 0858   HCT 49.7 01/04/2023 1419   PLT 236 08/30/2023 0858   PLT 266 01/04/2023 1419   MCV 91.4 08/30/2023 0858   MCV 87 01/04/2023 1419   MCH 30.9 08/30/2023 0858   MCHC 33.8 08/30/2023 0858   RDW 13.6 08/30/2023 0858   RDW 12.3 01/04/2023 1419   LYMPHSABS 2.3 08/30/2023 0858   MONOABS 0.7 08/30/2023 0858   EOSABS 0.2 08/30/2023 0858   BASOSABS 0.0 08/30/2023 0858    ASSESSMENT AND PLAN: 1. Essential hypertension (Primary) Not at goal. Continue Diovan  80 mg daily. Add Norvasc  5 mg daily. - valsartan  (DIOVAN ) 80 MG tablet; Take 1 tablet (80 mg total) by mouth daily.  Dispense: 90 tablet; Refill: 3 - amLODipine  (NORVASC ) 5 MG tablet; Take 1 tablet (5 mg total) by mouth daily.  Dispense: 90 tablet; Refill: 2 - acetaminophen  (TYLENOL ) 500 MG tablet; Take 1 tablet (500 mg total) by mouth every 8 (eight) hours as needed.  Dispense: 60 tablet; Refill: 0  2. Chronic pain of both knees Likely due to OA Tylenol  500 mg Q8 hrs PRN - AMB referral to orthopedics - DG Knee Complete 4 Views Left; Future - DG Knee Complete 4 Views Right; Future  3. Alcoholic cirrhosis of liver without ascites (  HCC) Stable and compensated  4. Need for immunization against influenza - Flu vaccine trivalent PF, 6mos and older(Flulaval,Afluria,Fluarix,Fluzone)     Patient was given the opportunity to ask questions.  Patient  verbalized understanding of the plan and was able to repeat key elements of the plan.   This documentation was completed using Paediatric nurse.  Any transcriptional errors are unintentional.  Orders Placed This Encounter  Procedures   DG Knee Complete 4 Views Left   DG Knee Complete 4 Views Right   Flu vaccine trivalent PF, 6mos and older(Flulaval,Afluria,Fluarix,Fluzone)   AMB referral to orthopedics     Requested Prescriptions   Signed Prescriptions Disp Refills   valsartan  (DIOVAN ) 80 MG tablet 90 tablet 3    Sig: Take 1 tablet (80 mg total) by mouth daily.   amLODipine  (NORVASC ) 5 MG tablet 90 tablet 2    Sig: Take 1 tablet (5 mg total) by mouth daily.   acetaminophen  (TYLENOL ) 500 MG tablet 60 tablet 0    Sig: Take 1 tablet (500 mg total) by mouth every 8 (eight) hours as needed.    Return in about 5 months (around 06/06/2024).  Barnie Louder, MD, FACP

## 2024-01-15 ENCOUNTER — Encounter: Payer: Self-pay | Admitting: Gastroenterology

## 2024-01-16 ENCOUNTER — Encounter: Payer: Self-pay | Admitting: Physician Assistant

## 2024-01-31 ENCOUNTER — Other Ambulatory Visit (INDEPENDENT_AMBULATORY_CARE_PROVIDER_SITE_OTHER): Payer: Self-pay

## 2024-01-31 ENCOUNTER — Ambulatory Visit: Payer: Self-pay | Admitting: Physician Assistant

## 2024-01-31 DIAGNOSIS — M25561 Pain in right knee: Secondary | ICD-10-CM

## 2024-01-31 DIAGNOSIS — M25562 Pain in left knee: Secondary | ICD-10-CM

## 2024-01-31 DIAGNOSIS — M170 Bilateral primary osteoarthritis of knee: Secondary | ICD-10-CM

## 2024-01-31 MED ORDER — LIDOCAINE HCL 1 % IJ SOLN
3.0000 mL | INTRAMUSCULAR | Status: AC | PRN
  Administered 2024-01-31: 10:00:00 3 mL

## 2024-01-31 MED ORDER — METHYLPREDNISOLONE ACETATE 40 MG/ML IJ SUSP
13.3300 mg | INTRAMUSCULAR | Status: AC | PRN
  Administered 2024-01-31: 10:00:00 13.33 mg via INTRA_ARTICULAR

## 2024-01-31 MED ORDER — BUPIVACAINE HCL 0.25 % IJ SOLN
0.6600 mL | INTRAMUSCULAR | Status: AC | PRN
  Administered 2024-01-31: 10:00:00 .66 mL via INTRA_ARTICULAR

## 2024-01-31 NOTE — Progress Notes (Signed)
 Office Visit Note   Patient: Danny Arnold           Date of Birth: 1975/04/29           MRN: [id number] Visit Date: 01/31/2024              Requested by: Vicci Barnie NOVAK, MD [address] [place],  [place] [address] PCP: Vicci Barnie NOVAK, MD   Assessment & Plan: Visit Diagnoses:  1. Bilateral primary osteoarthritis of knee     Plan: Impression is bilateral knee osteoarthritis primarily medial compartment.  Today, we discussed various treatment options to include intra-articular cortisone injections for which she would like to proceed.  He will follow-up with us  as needed.  Call with concerns or questions.  Follow-Up Instructions: No follow-ups on file.   Orders:  Orders Placed This Encounter  Procedures   Large Joint Inj: bilateral knee   XR KNEE 3 VIEW LEFT   XR KNEE 3 VIEW RIGHT   No orders of the defined types were placed in this encounter.     Procedures: Large Joint Inj: bilateral knee on 01/31/2024 9:37 AM Indications: pain Details: 22 G needle, anterolateral approach Medications (Right): 0.66 mL bupivacaine  0.25 %; 3 mL lidocaine  1 %; 13.33 mg methylPREDNISolone  acetate 40 MG/ML Medications (Left): 0.66 mL bupivacaine  0.25 %; 3 mL lidocaine  1 %; 13.33 mg methylPREDNISolone  acetate 40 MG/ML      Clinical Data: No additional findings.   Subjective: Chief Complaint  Patient presents with   Right Knee - Pain   Left Knee - Pain    HPI patient is a pleasant 48 year old Spanish-speaking gentleman here today with interpreter.  He is here with bilateral knee pain left greater than right.  Left knee pain began about 4 months ago and right knee pain began about a month ago.  He denies any injury or change in activity.  The pain he has is to the medial aspect of both knees which occurs primarily with walking as well as with stair climbing.  He has been using a topical anti-inflammatory without relief.  No previous cortisone injection to  the knee.  Review of Systems as detailed in HPI.  All others reviewed and are negative.   Objective: Vital Signs: There were no vitals taken for this visit.  Physical Exam well-developed well-nourished gentleman in no acute distress.  Alert and oriented x 3.  Ortho Exam bilateral knee exam: Trace effusion.  Range of motion 0 to 110 degrees.  Medial joint line tenderness.  Mild patellofemoral crepitus.  He is neurovascular intact distally.  Specialty Comments:  No specialty comments available.  Imaging: XR KNEE 3 VIEW RIGHT Result Date: 01/31/2024 X-rays demonstrate moderate degenerative changes to the medial compartment.  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   Arthritis of elbow, right 07/07/2021   Radial tunnel syndrome, right 12/22/2020   Influenza vaccine needed 04/12/2020   Patellofemoral pain syndrome of both knees 08/05/2019   Lateral epicondylitis of both elbows 08/05/2019   Constipation 12/21/2017   Elevated blood pressure reading 12/04/2017   History of alcohol abuse 06/19/2017   Gastritis and gastroduodenitis    Umbilical hernia 11/12/2013   Alcoholic hepatitis (HCC) 11/24/2012   Cirrhosis, alcoholic (HCC) 10/28/2012   Past Medical History:  Diagnosis Date   Alcoholic hepatitis (  HCC)    Alcoholic liver disease    Anemia    Ascites 02/03/2013   Bacteremia     Family History  Problem Relation Age of Onset   Migraines Mother    Colon cancer Neg Hx     Past Surgical History:  Procedure Laterality Date   BIOPSY  04/10/2022   Procedure: BIOPSY;  Surgeon: Cindie Carlin POUR, DO;  [facility];  Service: Endoscopy;;   COLONOSCOPY N/A 11/25/2012   [facility]   COLONOSCOPY WITH PROPOFOL  N/A 04/10/2022   Procedure: COLONOSCOPY WITH PROPOFOL ;  Surgeon: Cindie Carlin POUR,  DO;  [facility];  Service: Endoscopy;  Laterality: N/A;  11:00 am   ESOPHAGEAL BANDING N/A 04/08/2019   Procedure: ESOPHAGEAL BANDING;  Surgeon: Harvey Margo CROME, MD;  [facility];  Service: Endoscopy;  Laterality: N/A;   ESOPHAGOGASTRODUODENOSCOPY N/A 11/25/2012   GRADE 1 VARICES   ESOPHAGOGASTRODUODENOSCOPY N/A 04/20/2016   Procedure: ESOPHAGOGASTRODUODENOSCOPY (EGD);  Surgeon: Margo CROME Harvey, MD;  [facility];  Service: Endoscopy;  Laterality: N/A;  8:30am   ESOPHAGOGASTRODUODENOSCOPY (EGD) WITH PROPOFOL  N/A 04/08/2019   Grade 1 varices, moderate gastritis and duodenitis, surveillance due in 2024.   ESOPHAGOGASTRODUODENOSCOPY (EGD) WITH PROPOFOL  N/A 04/10/2022   Procedure: ESOPHAGOGASTRODUODENOSCOPY (EGD) WITH PROPOFOL ;  Surgeon: Cindie Carlin POUR, DO;  [facility];  Service: Endoscopy;  Laterality: N/A;   HAND SURGERY Left    removal of foreign body and tendon repair.   HEMORRHOID BANDING  2014 x1   POLYPECTOMY  04/10/2022   Procedure: POLYPECTOMY;  Surgeon: Cindie Carlin POUR, DO;  [facility];  Service: Endoscopy;;   UMBILICAL HERNIA REPAIR N/A 08/30/2015   Procedure: UMBILICAL HERNIORRHAPHY WITH MESH;  Surgeon: Oneil Budge, MD;  Location: [facility];  Service: General;  Laterality: N/A;   Social History   Occupational History   Not on file  Tobacco Use   Smoking status: Former    Current packs/day: 0.00    Average packs/day: 0.3 packs/day for 15.0 years (3.8 ttl pk-yrs)    Types: Cigarettes    Start date: 04/03/1997    Quit date: 04/03/2012    Years since quitting: 11.8   Smokeless tobacco: Never   Tobacco comments:    Quit smoking more than a year ago  Vaping Use   Vaping status: Never Used  Substance and Sexual Activity   Alcohol use: No    Comment: As of 03/31/21: heavy etoh until 02/2012- none since 2014.   Drug use: No   Sexual activity: Yes

## 2024-02-28 ENCOUNTER — Other Ambulatory Visit: Payer: Self-pay

## 2024-02-29 ENCOUNTER — Other Ambulatory Visit: Payer: Self-pay

## 2024-04-01 ENCOUNTER — Ambulatory Visit: Payer: Self-pay | Admitting: Gastroenterology

## 2024-06-06 ENCOUNTER — Ambulatory Visit: Payer: Self-pay | Admitting: Internal Medicine

## 2024-06-23 ENCOUNTER — Ambulatory Visit: Payer: Self-pay | Admitting: Internal Medicine
